# Patient Record
Sex: Female | Born: 1975 | Hispanic: No | Marital: Married | Smoking: Never smoker
Health system: Southern US, Community
[De-identification: ages and names within clinical notes are randomized; demographics above are authoritative.]

## PROBLEM LIST (undated history)

## (undated) ENCOUNTER — Inpatient Hospital Stay (HOSPITAL_COMMUNITY): Payer: Self-pay

## (undated) DIAGNOSIS — D219 Benign neoplasm of connective and other soft tissue, unspecified: Secondary | ICD-10-CM

## (undated) DIAGNOSIS — N96 Recurrent pregnancy loss: Secondary | ICD-10-CM

## (undated) DIAGNOSIS — Z789 Other specified health status: Secondary | ICD-10-CM

## (undated) DIAGNOSIS — I10 Essential (primary) hypertension: Secondary | ICD-10-CM

## (undated) DIAGNOSIS — C801 Malignant (primary) neoplasm, unspecified: Secondary | ICD-10-CM

## (undated) DIAGNOSIS — E119 Type 2 diabetes mellitus without complications: Secondary | ICD-10-CM

## (undated) HISTORY — DX: Essential (primary) hypertension: I10

## (undated) HISTORY — PX: CATARACT EXTRACTION: SUR2

## (undated) HISTORY — DX: Recurrent pregnancy loss: N96

## (undated) HISTORY — DX: Malignant (primary) neoplasm, unspecified: C80.1

## (undated) HISTORY — DX: Type 2 diabetes mellitus without complications: E11.9

## (undated) MED FILL — Pegfilgrastim Soln Prefilled Syringe 6 MG/0.6ML: SUBCUTANEOUS | Qty: 0.6 | Status: AC

---

## 2013-03-15 ENCOUNTER — Emergency Department (HOSPITAL_COMMUNITY)
Admission: EM | Admit: 2013-03-15 | Discharge: 2013-03-15 | Disposition: A | Discharge status: Home / Self Care | Payer: Self-pay | Attending: Emergency Medicine | Admitting: Emergency Medicine

## 2013-03-15 ENCOUNTER — Encounter (HOSPITAL_COMMUNITY): Payer: Self-pay | Admitting: Emergency Medicine

## 2013-03-15 DIAGNOSIS — R1013 Epigastric pain: Secondary | ICD-10-CM | POA: Insufficient documentation

## 2013-03-15 DIAGNOSIS — Z79899 Other long term (current) drug therapy: Secondary | ICD-10-CM | POA: Insufficient documentation

## 2013-03-15 DIAGNOSIS — Z3202 Encounter for pregnancy test, result negative: Secondary | ICD-10-CM | POA: Insufficient documentation

## 2013-03-15 DIAGNOSIS — R109 Unspecified abdominal pain: Secondary | ICD-10-CM

## 2013-03-15 LAB — CBC WITH DIFFERENTIAL/PLATELET
Basophils Absolute: 0 10*3/uL (ref 0.0–0.1)
Basophils Relative: 0 % (ref 0–1)
Eosinophils Absolute: 0.2 10*3/uL (ref 0.0–0.7)
Eosinophils Relative: 2 % (ref 0–5)
HCT: 40.9 % (ref 36.0–46.0)
Hemoglobin: 14.3 g/dL (ref 12.0–15.0)
Lymphs Abs: 2.8 10*3/uL (ref 0.7–4.0)
MCH: 30.3 pg (ref 26.0–34.0)
MCHC: 35 g/dL (ref 30.0–36.0)
Monocytes Absolute: 0.5 10*3/uL (ref 0.1–1.0)
Neutro Abs: 2.9 10*3/uL (ref 1.7–7.7)
RDW: 12.6 % (ref 11.5–15.5)

## 2013-03-15 LAB — POCT I-STAT, CHEM 8
BUN: 9 mg/dL (ref 6–23)
Calcium, Ion: 1.5 mmol/L — ABNORMAL HIGH (ref 1.12–1.23)
Chloride: 106 mEq/L (ref 96–112)
Creatinine, Ser: 0.9 mg/dL (ref 0.50–1.10)
Glucose, Bld: 98 mg/dL (ref 70–99)
Hemoglobin: 15.3 g/dL — ABNORMAL HIGH (ref 12.0–15.0)
TCO2: 25 mmol/L (ref 0–100)

## 2013-03-15 LAB — URINALYSIS, ROUTINE W REFLEX MICROSCOPIC
Bilirubin Urine: NEGATIVE
Hgb urine dipstick: NEGATIVE
Ketones, ur: NEGATIVE mg/dL
Leukocytes, UA: NEGATIVE
Protein, ur: NEGATIVE mg/dL
Urobilinogen, UA: 0.2 mg/dL (ref 0.0–1.0)
pH: 6 (ref 5.0–8.0)

## 2013-03-15 MED ORDER — FAMOTIDINE 20 MG PO TABS
20.0000 mg | ORAL_TABLET | Freq: Once | ORAL | Status: AC
  Administered 2013-03-15: 20 mg via ORAL
  Filled 2013-03-15: qty 1

## 2013-03-15 MED ORDER — SUCRALFATE 1 G PO TABS: 1.0000 g | ORAL_TABLET | Freq: Four times a day (QID) | ORAL | Status: DC

## 2013-03-15 MED ORDER — GI COCKTAIL ~~LOC~~
30.0000 mL | Freq: Once | ORAL | Status: AC
  Administered 2013-03-15: 30 mL via ORAL
  Filled 2013-03-15: qty 30

## 2013-03-15 MED ORDER — FAMOTIDINE 20 MG PO TABS: 20.0000 mg | ORAL_TABLET | Freq: Two times a day (BID) | ORAL | Status: DC

## 2013-03-15 MED ORDER — ACETAMINOPHEN 325 MG PO TABS
650.0000 mg | ORAL_TABLET | Freq: Once | ORAL | Status: AC
  Administered 2013-03-15: 650 mg via ORAL
  Filled 2013-03-15: qty 2

## 2013-03-15 NOTE — ED Provider Notes (Signed)
CSN: [id number]     Arrival date & time 03/15/13  1932 History   First MD Initiated Contact with Patient 03/15/13 2117     Chief Complaint  Patient presents with  . Abdominal Pain   (Consider location/radiation/quality/duration/timing/severity/associated sxs/prior Treatment) HPI Patient presents with concern of abdominal pain.  The pain has been present forweeks. Pain seems to be worse with supine positioning or when the patient is lying on her right side. The pain is epigastric, with occasional radiation up the sternum to the neck.  The pain is sharp, burning. Patient has not tried any medication consistently. Patient is generally well, denies history of medical issues. Patient does not smoke, does not drink, does like spicy food. No change in bowel habits, gas, no vomiting.  No fevers, no chills, no chest pain, dyspnea.   History reviewed. No pertinent past medical history. Past Surgical History  Procedure Laterality Date  . Cataract extraction     History reviewed. No pertinent family history. History  Substance Use Topics  . Smoking status: Never Smoker   . Smokeless tobacco: Not on file  . Alcohol Use: No   OB History   Grav Para Term Preterm Abortions TAB SAB Ect Mult Living                 Review of Systems  Constitutional:       Per HPI, otherwise negative  HENT:       Per HPI, otherwise negative  Respiratory:       Per HPI, otherwise negative  Cardiovascular:       Per HPI, otherwise negative  Gastrointestinal: Negative for vomiting.  Endocrine:       Negative aside from HPI  Genitourinary:       Neg aside from HPI   Musculoskeletal:       Per HPI, otherwise negative  Skin: Negative.   Neurological: Negative for syncope.    Allergies  Review of patient's allergies indicates no known allergies.  Home Medications   Current Outpatient Rx  Name  Route  Sig  Dispense  Refill  . acetaminophen (TYLENOL) 325 MG tablet   Oral   Take 325 mg by mouth  every 6 (six) hours as needed for mild pain or fever.         . famotidine (PEPCID) 20 MG tablet   Oral   Take 1 tablet (20 mg total) by mouth 2 (two) times daily.   30 tablet   0   . sucralfate (CARAFATE) 1 G tablet   Oral   Take 1 tablet (1 g total) by mouth 4 (four) times daily.   28 tablet   0    BP 131/74  Pulse 66  Temp(Src) 98.7 F (37.1 C) (Oral)  Resp 14  Ht 5\' 5"  (1.651 m)  Wt 188 lb 12.8 oz (85.639 kg)  BMI 31.42 kg/m2  SpO2 100%  LMP 02/22/2013 Physical Exam  Nursing note and vitals reviewed. Constitutional: She is oriented to person, place, and time. She appears well-developed and well-nourished. No distress.  HENT:  Head: Normocephalic and atraumatic.  Eyes: Conjunctivae and EOM are normal.  Cardiovascular: Normal rate and regular rhythm.   Pulmonary/Chest: Effort normal and breath sounds normal. No stridor. No respiratory distress.  Abdominal: She exhibits no distension. There is no hepatosplenomegaly. There is tenderness in the epigastric area. There is no rigidity, no rebound and no CVA tenderness.  Musculoskeletal: She exhibits no edema.  Neurological: She is alert and oriented to  person, place, and time. No cranial nerve deficit.  Skin: Skin is warm and dry.  Psychiatric: She has a normal mood and affect.    ED Course  Procedures (including critical care time) Labs Review Labs Reviewed  POCT I-STAT, CHEM 8 - Abnormal; Notable for the following:    Calcium, Ion 1.50 (*)    Hemoglobin 15.3 (*)    All other components within normal limits  CBC WITH DIFFERENTIAL  LIPASE, BLOOD  URINALYSIS, ROUTINE W REFLEX MICROSCOPIC  POCT PREGNANCY, URINE   Imaging Review No results found.  EKG Interpretation   None      11:41 PM On re-exam the patient appears calm.  We discussed her results, a trial of medication, the need for followup if she's not improving, and return precautions the tissue she gets worse MDM   1. Abdominal pain    This patient  presents with ongoing abdominal pain.  On exam she is awake and alert, with mild epigastric tenderness to palpation, but no evidence of peritonitis.  With her description of pain is burning, radiates from the sternum superiorly, and is worse with positioning, there is concern for gastroesophageal etiology.  Patient's labs are reassuring, she improved following indication here.  She started Nortrel medication, discharged in stable condition.    Gerhard Munch, MD 03/15/13 [phone]

## 2013-03-15 NOTE — ED Notes (Signed)
Patient with chronic abdominal pain that has been getting worse today, especially when laying on right side.  Patient denies any nausea, vomiting or diarrhea.  Patient is CAOx3.

## 2014-05-18 ENCOUNTER — Other Ambulatory Visit: Payer: Self-pay | Admitting: Internal Medicine

## 2014-05-18 DIAGNOSIS — N939 Abnormal uterine and vaginal bleeding, unspecified: Secondary | ICD-10-CM

## 2014-05-18 DIAGNOSIS — N920 Excessive and frequent menstruation with regular cycle: Secondary | ICD-10-CM

## 2014-06-01 ENCOUNTER — Ambulatory Visit
Admission: RE | Admit: 2014-06-01 | Discharge: 2014-06-01 | Disposition: A | Discharge status: Home / Self Care | Payer: PRIVATE HEALTH INSURANCE | Source: Ambulatory Visit | Attending: Internal Medicine | Admitting: Internal Medicine

## 2014-06-01 ENCOUNTER — Encounter (HOSPITAL_COMMUNITY): Payer: Self-pay | Admitting: Emergency Medicine

## 2014-06-01 ENCOUNTER — Emergency Department (HOSPITAL_COMMUNITY): Payer: PRIVATE HEALTH INSURANCE

## 2014-06-01 ENCOUNTER — Emergency Department (HOSPITAL_COMMUNITY)
Admission: EM | Admit: 2014-06-01 | Discharge: 2014-06-01 | Disposition: A | Discharge status: Home / Self Care | Payer: PRIVATE HEALTH INSURANCE | Attending: Emergency Medicine | Admitting: Emergency Medicine

## 2014-06-01 DIAGNOSIS — Z79899 Other long term (current) drug therapy: Secondary | ICD-10-CM | POA: Diagnosis not present

## 2014-06-01 DIAGNOSIS — N920 Excessive and frequent menstruation with regular cycle: Secondary | ICD-10-CM

## 2014-06-01 DIAGNOSIS — N939 Abnormal uterine and vaginal bleeding, unspecified: Secondary | ICD-10-CM

## 2014-06-01 DIAGNOSIS — R42 Dizziness and giddiness: Secondary | ICD-10-CM | POA: Diagnosis not present

## 2014-06-01 DIAGNOSIS — Z9849 Cataract extraction status, unspecified eye: Secondary | ICD-10-CM | POA: Insufficient documentation

## 2014-06-01 DIAGNOSIS — R002 Palpitations: Secondary | ICD-10-CM | POA: Diagnosis not present

## 2014-06-01 DIAGNOSIS — Z3202 Encounter for pregnancy test, result negative: Secondary | ICD-10-CM | POA: Insufficient documentation

## 2014-06-01 DIAGNOSIS — R5383 Other fatigue: Secondary | ICD-10-CM | POA: Diagnosis not present

## 2014-06-01 LAB — CBC WITH DIFFERENTIAL/PLATELET
BASOS ABS: 0 10*3/uL (ref 0.0–0.1)
Basophils Relative: 0 % (ref 0–1)
Eosinophils Absolute: 0.1 10*3/uL (ref 0.0–0.7)
Eosinophils Relative: 3 % (ref 0–5)
HCT: 39 % (ref 36.0–46.0)
HEMOGLOBIN: 13 g/dL (ref 12.0–15.0)
LYMPHS ABS: 1.9 10*3/uL (ref 0.7–4.0)
Lymphocytes Relative: 43 % (ref 12–46)
MCH: 28.5 pg (ref 26.0–34.0)
MCHC: 33.3 g/dL (ref 30.0–36.0)
MCV: 85.5 fL (ref 78.0–100.0)
MONOS PCT: 7 % (ref 3–12)
Monocytes Absolute: 0.3 10*3/uL (ref 0.1–1.0)
Neutro Abs: 2.1 10*3/uL (ref 1.7–7.7)
Neutrophils Relative %: 47 % (ref 43–77)
Platelets: 206 10*3/uL (ref 150–400)
RBC: 4.56 MIL/uL (ref 3.87–5.11)
RDW: 12.6 % (ref 11.5–15.5)
WBC: 4.4 10*3/uL (ref 4.0–10.5)

## 2014-06-01 LAB — URINALYSIS, ROUTINE W REFLEX MICROSCOPIC
Bilirubin Urine: NEGATIVE
Glucose, UA: NEGATIVE mg/dL
Hgb urine dipstick: NEGATIVE
Ketones, ur: NEGATIVE mg/dL
Leukocytes, UA: NEGATIVE
Nitrite: NEGATIVE
PH: 7.5 (ref 5.0–8.0)
Protein, ur: NEGATIVE mg/dL
Specific Gravity, Urine: 1.006 (ref 1.005–1.030)
UROBILINOGEN UA: 0.2 mg/dL (ref 0.0–1.0)

## 2014-06-01 LAB — COMPREHENSIVE METABOLIC PANEL
ALBUMIN: 4 g/dL (ref 3.5–5.2)
ALK PHOS: 71 U/L (ref 39–117)
ALT: 18 U/L (ref 0–35)
AST: 18 U/L (ref 0–37)
Anion gap: 6 (ref 5–15)
BUN: 7 mg/dL (ref 6–23)
CHLORIDE: 106 mmol/L (ref 96–112)
CO2: 26 mmol/L (ref 19–32)
CREATININE: 0.75 mg/dL (ref 0.50–1.10)
Calcium: 10.6 mg/dL — ABNORMAL HIGH (ref 8.4–10.5)
GFR calc non Af Amer: >90 mL/min (ref >90)
GLUCOSE: 106 mg/dL — AB (ref 70–99)
POTASSIUM: 4 mmol/L (ref 3.5–5.1)
Sodium: 138 mmol/L (ref 135–145)
TOTAL PROTEIN: 7.5 g/dL (ref 6.0–8.3)
Total Bilirubin: 0.7 mg/dL (ref 0.3–1.2)

## 2014-06-01 LAB — POC URINE PREG, ED: Preg Test, Ur: NEGATIVE

## 2014-06-01 LAB — TROPONIN I

## 2014-06-01 MED ORDER — NAPROXEN 500 MG PO TABS
500.0000 mg | ORAL_TABLET | Freq: Two times a day (BID) | ORAL | Status: DC
Start: 2014-06-01 — End: 2014-09-30

## 2014-06-01 NOTE — Discharge Instructions (Signed)
Take the prescribed medication as directed to help with joint pains. Follow-up with your doctor to have your sestamibi scan done.  Your results from today are attached on back. Return to the ED for new or worsening symptoms.   Emergency Department Resource Guide 1) Find a Doctor and Pay Out of Pocket Although you won't have to find out who is covered by your insurance plan, it is a good idea to ask around and get recommendations. You will then need to call the office and see if the doctor you have chosen will accept you as a new patient and what types of options they offer for patients who are self-pay. Some doctors offer discounts or will set up payment plans for their patients who do not have insurance, but you will need to ask so you aren't surprised when you get to your appointment.  2) Contact Your Local Health Department Not all health departments have doctors that can see patients for sick visits, but many do, so it is worth a call to see if yours does. If you don't know where your local health department is, you can check in your phone book. The CDC also has a tool to help you locate your state's health department, and many state websites also have listings of all of their local health departments.  3) Find a [facility] [place] [facility] If your illness is not likely to be very severe or complicated, you may want to try a walk in clinic. These are popping up all over the country in pharmacies, drugstores, and shopping centers. They're usually staffed by nurse practitioners or physician assistants that have been trained to treat common illnesses and complaints. They're usually fairly quick and inexpensive. However, if you have serious medical issues or chronic medical problems, these are probably not your best option.  No Primary Care Doctor: - Call Health Connect at  [phone] - they can help you locate a primary care doctor that  accepts your insurance, provides certain services, etc. - Physician Referral  Service- [phone]  Chronic Pain Problems: Organization         Address  Phone   Notes  [facility]  [phone] Patients need to be referred by their primary care doctor.   Medication Assistance: Organization         Address  Phone   Notes  [facility] Medication [facility] [address] [place], [place] [address] [phone] --Must be a resident of [facility] -- Must have NO insurance coverage whatsoever (no Medicaid/ Medicare, etc.) -- The pt. MUST have a primary care doctor that directs their care regularly and follows them in the community   [facility]ist  [phone]   [employer]  [phone]    Agencies that provide inexpensive medical care: Organization         Address  Phone   Notes  [place]  [phone]   [facility]    [phone]   [facility] [place], [place] [address] [phone]   [place] [address] [place] [phone]   Planned Parenthood    [phone]   [facility]    [phone]   [place] and [place] [address],  Phone:  [phone], Fax:  [phone] Hours of Operation:  9 am - 6 pm, M-F.  Also accepts Medicaid/Medicare and self-pay.  [facility] for [place] [address], [address], [place] Phone: [phone], Fax: [phone]. Hours of Operation:  8:30 am - 5:30 pm, M-F.  Also accepts Medicaid and self-pay.  [facility] [place] [address] [place] Phone: [phone]   [place], [place], [place] [phone], Ext. [phone] Mondays & Thursdays: 7-9 AM.  First 15 patients are seen on a first come, first serve basis.    [place] Providers:  Organization         Address  Phone   Notes  [facility] [address],  [phone] Also accepts self-pay patients.  [facility] [address], [place]  [phone]   [place], [address], [place] [phone]   [facility] [address] [place] [phone]   Lucianne Lei [address], [place]   [phone] Only accepts [place] Access [place] patients after they have their name applied to their card.   Self-Pay (no insurance) in [facility]:  Organization         Address  Phone   Notes  Sickle Cell Patients, [facility] [place] [phone]   [facility] Urgent Care [place] [phone]   [facility] [place]  [place], [place], [place] [phone]   [facility]/Dr. Osei-Bonsu  [address], [place] or [address], [place] [phone] Phone number for both [place] and [place] locations is the same.  Urgent Medical and [facility] [address] [place] [phone]   [facility] [place] [address], [place] or [address] [phone]   [facility] [name], [place] [phone], phone; [phone], fax Sees patients 1st and 3rd Saturday of every month.  Must not qualify for public or private insurance (i.e. Medicaid, Medicare, [place] Health Choice, Veterans' Benefits)  Household income should be no more than 200% of the poverty level The clinic cannot treat you if you are pregnant or think you are pregnant  Sexually transmitted diseases are not treated at the clinic.    Dental Care: Organization         Address  Phone  Notes  [facility] [place] [phone] Accepts children up to age 97 who are enrolled in  [place] or [place]; pregnant women with a Medicaid card; and children who have applied for Medicaid or [place] Health Choice, but were declined, whose parents can pay a reduced fee at time of service.  [facility]  [address], [place] [phone] Accepts children up to age 19 who are enrolled in [place] or [place]; pregnant women with a Medicaid card; and children who have applied for Medicaid or [place] Health Choice, but were declined, whose parents can pay a reduced fee at time of service.  [facility] Adult [facility] Access PROGRAM  [place] [phone] Patients are seen by appointment only. Walk-ins are not accepted. [place] will see patients 69 years of age and older. Monday - Tuesday (8am-5pm) Most Wednesdays (8:30-5pm) $30 per  visit, cash only  [facility] Adult [employer] PROGRAM  [address], [facility] [address] [facility] [phone] Patients are seen by appointment only. Walk-ins are not accepted. [place] will see patients 48 years of age and older. One Wednesday Evening (Monthly: Volunteer Based).  $30 per visit, cash only  [place]  [phone] for adults; Children under age 62, call Graduate Pediatric Dentistry at [phone]. Children aged 41-14, please call [phone] to request a pediatric application.  Dental services are provided in all areas of dental care including fillings, crowns and bridges, complete and partial dentures, implants, gum treatment, root canals, and extractions. Preventive care is also provided. Treatment is provided to both adults and children. Patients are selected via a lottery and there is often a waiting list.   [facility] [address] [place]  [phone] [email or web address]   [facility] [address] [place], [place] [phone], Ext. [phone] Second and Fourth Thursday of each month, opens at 6:30  AM; Clinic ends at 9 AM.  Patients are seen on a first-come first-served basis, and a limited number are seen during each clinic.   [facility]  [address] [place] Danker [place], [place] [phone]   Eligibility Requirements You must have lived in [place], [place], or [place] counties for at least the last three months.   You cannot be eligible for state or federal sponsored [employer], including [employer], [place], or [employer].   You generally cannot be eligible for healthcare insurance through your employer.    How to apply: Eligibility screenings are held every Tuesday and Wednesday afternoon from 1:00 pm until 4:00 pm. You do not need an appointment for the interview!  [facility] [address], [place], [place]   [place]  [place] Department  [place]  [phone]    Behavioral Health Resources in the Community: Intensive Outpatient Programs Organization         Address  Phone  Notes  [place]. [address] [place], [place] [phone]   [facility] Outpatient [address] [place], [place]   ADS: Alcohol & Drug S[facility]cs [address] [place], [place]   [place] [address]  [place], [place] or [phone]   Substance Abuse Resources Organization         Address  Phone  Notes  Alcohol and Drug Services  [phone]   [place]  [phone]   [facility] [place]   Chinita Pester  [phone]   Residential & Outpatient Substance Abuse Program  [phone]   Psychological Services Organization         Address  Phone  Notes  [employer] [place]  [place]  [phone]   [place] [address] [place] or  [phone]    Mobile Crisis Teams Organization         Address  Phone  Notes  [facility], Mobile Crisis Care Unit  [phone]   [facility]  [address] [place], [place]   [place] [address], [place] [phone]    Self-Help/Support Groups Organization         Address  Phone  Notes  [facility]ental [facility]oc[facility] of [place] - variety of support groups  [place] Call for more information  Narcotics Anonymous (NA), [facility] [address], [employer] [place]  2 meetings at this location   Special educational needs teacher         Address  Phone  Notes  ASAP Residential Treatment [place],    [place]  [phone]   [facility]  [address] [place] [phone], [place], [place]   [place], [name] [phone] Admissions: 8am-3pm M-F  Incentives Substance [place] [address]    [place], [place] [phone]   [facility] [address] [place], [place], [place]   [facility] [address],  [place], [place]   [facility] - Intensive Outpatient [address] Kristeen [address], [place], [place]   [facility] [address]  [place], [place] [phone] or [phone]   [facility] Services (RTS) [address] [place], [place] Accepts Medicaid  [facility] [place] [address],  [place] [phone] Substance Abuse/Addiction Treatment   [facility] Organization         Address  Phone  Notes  [facility]  [phone]   Domenic Schwab, PhD [address] Arlis Porta [place], [place]   [phone] or [phone]   [place] and [place], [place] [phone]   [facility] [address],  [place], [place] [phone] Insurance/Medicaid/sponsorship through [facility] and Families [address], [address] [place]                                    [place], [place] [phone] [place] [address] [place], [place] [phone]    Dr. Adele Schilder  [phone]   [facility] Dept. 1) [address], [place] 2) [place] 3)  [place] [address], [place] [phone]  [phone]   [place] [phone] or [phone] (After Hours)

## 2014-06-01 NOTE — ED Notes (Signed)
Pt c/o dizziness x 2 days; pt denies pain and sts worse with position change

## 2014-06-01 NOTE — ED Provider Notes (Signed)
CSN: [id number]     Arrival date & time 06/01/14  1132 History   First MD Initiated Contact with Patient 06/01/14 1459     Chief Complaint  Patient presents with  . Dizziness     (Consider location/radiation/quality/duration/timing/severity/associated sxs/prior Treatment) The history is provided by the patient and medical records.    [name] with past medical history significant for cataracts, presenting to the ED for intermittent dizziness. Patient states for the past 2 months she has had intermittent dizziness, mostly upon standing and with movement. She also feels very fatigued on a daily basis with low energy. Her appetite has been normal, eating well. No significant weight loss.  Patient is also currently being evaluated for hypercalcemia at another facility. She is scheduled for a sestamibi scan in 2 weeks for further evaluation of this. Her thyroid function tests have all been normal thus far. She also admits to some palpitations over the past 2 days. She denies any chest pain or shortness of breath. Patient has no known cardiac history. She is not a smoker.  She denies recent illness, fever, chills, sweats, cough, congestion, or other URI symptoms.  VSS on arrival.  History reviewed. No pertinent past medical history. Past Surgical History  Procedure Laterality Date  . Cataract extraction     History reviewed. No pertinent family history. History  Substance Use Topics  . Smoking status: Never Smoker   . Smokeless tobacco: Not on file  . Alcohol Use: No   OB History    No data available     Review of Systems  Constitutional: Positive for fatigue.  Cardiovascular: Positive for palpitations.  Neurological: Positive for dizziness.  All other systems reviewed and are negative.     Allergies  Review of patient's allergies indicates no known allergies.  Home Medications   Prior to Admission medications   Medication Sig Start Date End Date Taking?  Authorizing Provider  acetaminophen (TYLENOL) 325 MG tablet Take 325 mg by mouth every 6 (six) hours as needed for mild pain or fever.    Historical Provider, MD  famotidine (PEPCID) 20 MG tablet Take 1 tablet (20 mg total) by mouth 2 (two) times daily. 03/15/13   Carmin Muskrat, MD  sucralfate (CARAFATE) 1 G tablet Take 1 tablet (1 g total) by mouth 4 (four) times daily. 03/15/13 03/22/13  Carmin Muskrat, MD   BP 118/69 mmHg  Pulse 74  Temp(Src) 99.1 F (37.3 C) (Oral)  Resp 16  Ht 5\' 8"  (1.727 m)  Wt 171 lb (77.565 kg)  BMI 26.01 kg/m2  SpO2 100%   Physical Exam  Constitutional: She is oriented to person, place, and time. She appears well-developed and well-nourished. No distress.  HENT:  Head: Normocephalic and atraumatic.  Mouth/Throat: Oropharynx is clear and moist.  Eyes: Conjunctivae and EOM are normal. Pupils are equal, round, and reactive to light.  Neck: Trachea normal, normal range of motion and phonation normal. No thyroid mass and no thyromegaly present.  No appreciable neck mass; no thryomegaly  Cardiovascular: Normal rate, regular rhythm and normal heart sounds.   Pulmonary/Chest: Effort normal and breath sounds normal. No respiratory distress. She has no wheezes.  Abdominal: Soft. Bowel sounds are normal. There is no tenderness. There is no guarding.  Musculoskeletal: Normal range of motion. She exhibits no edema.  Neurological: She is alert and oriented to person, place, and time.  AAOx3, answering questions appropriately; equal strength UE and LE bilaterally; CN grossly intact; moves all extremities  appropriately without ataxia; no focal neuro deficits or facial asymmetry appreciated; steady gait  Skin: Skin is warm and dry. She is not diaphoretic.  Psychiatric: She has a normal mood and affect.  Nursing note and vitals reviewed.   ED Course  Procedures (including critical care time) Labs Review Labs Reviewed  COMPREHENSIVE METABOLIC PANEL - Abnormal; Notable  for the following:    Glucose, Bld 106 (*)    Calcium 10.6 (*)    All other components within normal limits  CBC WITH DIFFERENTIAL/PLATELET  URINALYSIS, ROUTINE W REFLEX MICROSCOPIC  POC URINE PREG, ED    Imaging Review US Transvaginal Non-ob  06/01/2014   CLINICAL DATA:  Excessive vaginal bleeding. Generalized pelvic pain for several months. Patient reports a history of an endometrial polyp seen many years ago.  EXAM: TRANSABDOMINAL AND TRANSVAGINAL ULTRASOUND OF PELVIS  DOPPLER ULTRASOUND OF OVARIES  TECHNIQUE: Both transabdominal and transvaginal ultrasound examinations of the pelvis were performed. Transabdominal technique was performed for global imaging of the pelvis including uterus, ovaries, adnexal regions, and pelvic cul-de-sac.  It was necessary to proceed with endovaginal exam following the transabdominal exam to visualize the uterus, endometrium and ovaries to better advantage. Color and duplex Doppler ultrasound was utilized to evaluate blood flow to the ovaries.  COMPARISON:  None.  FINDINGS: Uterus  Measurements: 11.6 cm x 5.9 cm x 7.0 cm. There is a heterogeneous mildly hypoechoic mass arises from the upper left uterine segment and fundus, with a submucosal component displacing the endometrium. Mass measures 5.5 cm x 4.3 cm x 4.7 cm. No other uterine masses.  Endometrium  Thickness: 4.4 mm.  No focal abnormality visualized.  Right ovary  Measurements: 14 mm x 30 mm x 14 mm. Normal appearance/no adnexal mass.  Left ovary  Measurements: 4.9 cm x 1.9 cm x 2.9 cm. Two irregular cysts are noted along 1 pole measuring 15 mm and 12 mm in greatest dimensions. No adnexal masses.  Pulsed Doppler evaluation of both ovaries demonstrates normal low-resistance arterial and venous waveforms.  Other findings  No free fluid.  IMPRESSION: 1. No acute findings. 2. 5.5 cm left upper uterine segment and uterine fundal fibroid with a submucosal component displacing the overlying endometrium. This may be the  cause of this patient's abnormal uterine bleeding. 3. Two small mildly complicated cysts along the margin of the left ovary. 4. No evidence of an endometrial polyp.  No endometrial thickening.   Electronically Signed   By: Lajean Manes M.D.   On: 06/01/2014 13:23   US Pelvis Complete  06/01/2014   CLINICAL DATA:  Excessive vaginal bleeding. Generalized pelvic pain for several months. Patient reports a history of an endometrial polyp seen many years ago.  EXAM: TRANSABDOMINAL AND TRANSVAGINAL ULTRASOUND OF PELVIS  DOPPLER ULTRASOUND OF OVARIES  TECHNIQUE: Both transabdominal and transvaginal ultrasound examinations of the pelvis were performed. Transabdominal technique was performed for global imaging of the pelvis including uterus, ovaries, adnexal regions, and pelvic cul-de-sac.  It was necessary to proceed with endovaginal exam following the transabdominal exam to visualize the uterus, endometrium and ovaries to better advantage. Color and duplex Doppler ultrasound was utilized to evaluate blood flow to the ovaries.  COMPARISON:  None.  FINDINGS: Uterus  Measurements: 11.6 cm x 5.9 cm x 7.0 cm. There is a heterogeneous mildly hypoechoic mass arises from the upper left uterine segment and fundus, with a submucosal component displacing the endometrium. Mass measures 5.5 cm x 4.3 cm x 4.7 cm. No other uterine masses.  Endometrium  Thickness: 4.4 mm.  No focal abnormality visualized.  Right ovary  Measurements: 14 mm x 30 mm x 14 mm. Normal appearance/no adnexal mass.  Left ovary  Measurements: 4.9 cm x 1.9 cm x 2.9 cm. Two irregular cysts are noted along 1 pole measuring 15 mm and 12 mm in greatest dimensions. No adnexal masses.  Pulsed Doppler evaluation of both ovaries demonstrates normal low-resistance arterial and venous waveforms.  Other findings  No free fluid.  IMPRESSION: 1. No acute findings. 2. 5.5 cm left upper uterine segment and uterine fundal fibroid with a submucosal component displacing the  overlying endometrium. This may be the cause of this patient's abnormal uterine bleeding. 3. Two small mildly complicated cysts along the margin of the left ovary. 4. No evidence of an endometrial polyp.  No endometrial thickening.   Electronically Signed   By: Lajean Manes M.D.   On: 06/01/2014 13:23     EKG Interpretation   Date/Time:  Monday June 01 2014 15:22:35 EST Ventricular Rate:  67 PR Interval:  176 QRS Duration: 82 QT Interval:  388 QTC Calculation: 409 R Axis:   30 Text Interpretation:  Normal sinus rhythm Normal ECG No old tracing to  compare Confirmed by [facility]  MD, DAVID ([id number]) on 06/01/2014 3:30:23 PM      MDM   Final diagnoses:  Dizziness  Palpitations  Hypercalcemia   39 year old female here with multiple complaints. She reports dizziness and generalized fatigue intermittently for the past 2 months.  Patient also admits to palpitations over the past 2 days. Denies any associated chest pain or shortness of breath.  Patient is currently undergoing evaluation for hypercalcemia. She has copies of her labs here with her which i have reviewed.  Her most recent values were 11.2 on 05/20/14, up from 10.7 on 05/08/14.  TSH values WNL at 1.640.  Patient has been scheduled for sestamibi scan with concern for parathyroid tumor.  On exam, patient in no acute distress. Her neurologic exam is nonfocal. EKG sinus rhythm without acute ischemic changes. Labwork is reassuring, troponin negative. Minimal hypercalcemia again noted. Chest x-ray is clear. Due to family concern for intracranial pathology, CT head was obtained which is also negative.  Given negative workup today, low suspicion for neurologic or cardiac etiology of her dizziness. Low suspicion for ACS, PE, dissection, or other acute cardiac event. 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 She is to follow-up with clinic regarding her sestamibi scan.  She was also given resource guide if additional services needed.  Discussed plan with patient, he/she acknowledged understanding and agreed with plan of care.  Return precautions given for new or worsening symptoms.  Larene Pickett, PA-C 67/12/45 [id number]  Delora Fuel, MD 83/38/25 0539

## 2014-06-01 NOTE — ED Notes (Signed)
PA student in with pt.

## 2014-09-30 ENCOUNTER — Encounter (HOSPITAL_COMMUNITY): Payer: Self-pay

## 2014-09-30 ENCOUNTER — Inpatient Hospital Stay (HOSPITAL_COMMUNITY)
Admission: AD | Admit: 2014-09-30 | Discharge: 2014-09-30 | Disposition: A | Discharge status: Home / Self Care | Payer: PRIVATE HEALTH INSURANCE | Source: Ambulatory Visit | Attending: Obstetrics & Gynecology | Admitting: Obstetrics & Gynecology

## 2014-09-30 ENCOUNTER — Inpatient Hospital Stay (HOSPITAL_COMMUNITY): Payer: PRIVATE HEALTH INSURANCE

## 2014-09-30 DIAGNOSIS — R109 Unspecified abdominal pain: Secondary | ICD-10-CM | POA: Diagnosis present

## 2014-09-30 DIAGNOSIS — R1030 Lower abdominal pain, unspecified: Secondary | ICD-10-CM

## 2014-09-30 DIAGNOSIS — O3411 Maternal care for benign tumor of corpus uteri, first trimester: Secondary | ICD-10-CM | POA: Diagnosis not present

## 2014-09-30 DIAGNOSIS — O09519 Supervision of elderly primigravida, unspecified trimester: Secondary | ICD-10-CM

## 2014-09-30 DIAGNOSIS — D259 Leiomyoma of uterus, unspecified: Secondary | ICD-10-CM | POA: Diagnosis not present

## 2014-09-30 DIAGNOSIS — O9989 Other specified diseases and conditions complicating pregnancy, childbirth and the puerperium: Secondary | ICD-10-CM | POA: Diagnosis not present

## 2014-09-30 DIAGNOSIS — O09511 Supervision of elderly primigravida, first trimester: Secondary | ICD-10-CM

## 2014-09-30 DIAGNOSIS — O3680X0 Pregnancy with inconclusive fetal viability, not applicable or unspecified: Secondary | ICD-10-CM

## 2014-09-30 DIAGNOSIS — Z3A01 Less than 8 weeks gestation of pregnancy: Secondary | ICD-10-CM | POA: Diagnosis not present

## 2014-09-30 DIAGNOSIS — O26899 Other specified pregnancy related conditions, unspecified trimester: Secondary | ICD-10-CM

## 2014-09-30 HISTORY — DX: Benign neoplasm of connective and other soft tissue, unspecified: D21.9

## 2014-09-30 HISTORY — DX: Other specified health status: Z78.9

## 2014-09-30 LAB — CBC
HCT: 36.4 % (ref 36.0–46.0)
Hemoglobin: 12.3 g/dL (ref 12.0–15.0)
MCH: 29.4 pg (ref 26.0–34.0)
MCHC: 33.8 g/dL (ref 30.0–36.0)
MCV: 86.9 fL (ref 78.0–100.0)
Platelets: 246 10*3/uL (ref 150–400)
RBC: 4.19 MIL/uL (ref 3.87–5.11)
RDW: 13 % (ref 11.5–15.5)
WBC: 4.8 10*3/uL (ref 4.0–10.5)

## 2014-09-30 LAB — URINALYSIS, ROUTINE W REFLEX MICROSCOPIC
BILIRUBIN URINE: NEGATIVE
Glucose, UA: NEGATIVE mg/dL
KETONES UR: NEGATIVE mg/dL
Leukocytes, UA: NEGATIVE
NITRITE: NEGATIVE
PH: 6 (ref 5.0–8.0)
Protein, ur: NEGATIVE mg/dL
SPECIFIC GRAVITY, URINE: 1.025 (ref 1.005–1.030)
Urobilinogen, UA: 0.2 mg/dL (ref 0.0–1.0)

## 2014-09-30 LAB — URINE MICROSCOPIC-ADD ON

## 2014-09-30 LAB — ABO/RH: ABO/RH(D): B POS

## 2014-09-30 LAB — WET PREP, GENITAL
Clue Cells Wet Prep HPF POC: NONE SEEN
Trich, Wet Prep: NONE SEEN
YEAST WET PREP: NONE SEEN

## 2014-09-30 LAB — HCG, QUANTITATIVE, PREGNANCY: hCG, Beta Chain, Quant, S: 837 m[IU]/mL — ABNORMAL HIGH (ref <5)

## 2014-09-30 LAB — POCT PREGNANCY, URINE: Preg Test, Ur: POSITIVE — AB

## 2014-09-30 MED ORDER — PRENATAL PLUS 27-1 MG PO TABS: 1.0000 | ORAL_TABLET | Freq: Every day | ORAL | Status: DC

## 2014-09-30 NOTE — Discharge Instructions (Signed)

## 2014-09-30 NOTE — MAU Note (Signed)
Lower abd & back pain for the last 5 days, is late for period.

## 2014-09-30 NOTE — MAU Provider Note (Signed)
History     CSN: [id number]  Arrival date and time: 09/30/14 1214   First Provider Initiated Contact with Patient 09/30/14 1321    Friend here for interpretaion  Chief Complaint  Patient presents with  . Abdominal Pain  . Back Pain   HPI Comments: Crystal [place] is a 39 y.o. [place] G2P0010 at [redacted]w[redacted]d with history significant for fibroids presenting with intermittent lower abdominal cramping for the past 5 days. She denies vaginal bleeding since normal LMP 08/21/14. No hx menstrual irregularities or intermenstrual bleeding.    Abdominal Pain This is a new problem. The current episode started in the past 7 days. The onset quality is gradual. The problem occurs intermittently. Duration: several minutesR>L suprapubic. The problem has been unchanged. Pain location: right greater than left suprapubic region. The pain is at a severity of 3/10. The pain is mild. The quality of the pain is cramping. The abdominal pain radiates to the back (radiates to mid low back). Associated symptoms include frequency. Pertinent negatives include no constipation, dysuria or hematuria. Associated symptoms comments: No vaginal bleeding. Nothing aggravates the pain. The pain is relieved by nothing. She has tried nothing for the symptoms. Prior workup: Had [place] 06/01/14 showing 5cm fibroid.  Back Pain Associated symptoms include abdominal pain. Pertinent negatives include no dysuria.    OB History  Gravida Para Term Preterm AB SAB TAB Ectopic Multiple Living  1             # Outcome Date GA Lbr Len/2nd Weight Sex Delivery Anes PTL Lv  1 Current              2014: early SAB in [place], was on progesterone   Past Medical History  Diagnosis Date  . Medical history non-contributory     Past Surgical History  Procedure Laterality Date  . Cataract extraction      No family history on file.  History  Substance Use Topics  . Smoking status: Never Smoker   . Smokeless tobacco: Not on file  . Alcohol Use: No     Allergies: No Known Allergies  Prescriptions prior to admission  Medication Sig Dispense Refill Last Dose  . naproxen (NAPROSYN) 500 MG tablet Take 1 tablet (500 mg total) by mouth 2 (two) times daily with a meal. (Patient not taking: Reported on 09/30/2014) 30 tablet 0   . [DISCONTINUED] famotidine (PEPCID) 20 MG tablet Take 1 tablet (20 mg total) by mouth 2 (two) times daily. 30 tablet 0   . [DISCONTINUED] sucralfate (CARAFATE) 1 G tablet Take 1 tablet (1 g total) by mouth 4 (four) times daily. 28 tablet 0     Review of Systems  Gastrointestinal: Positive for abdominal pain. Negative for constipation.  Genitourinary: Positive for frequency. Negative for dysuria and hematuria.  Musculoskeletal: Positive for back pain.   Physical Exam   Blood pressure 127/72, pulse 100, temperature 98.8 F (37.1 C), temperature source Oral, resp. rate 18, last menstrual period 08/21/2014.  Physical Exam  Nursing note and vitals reviewed. Constitutional: She is oriented to person, place, and time. She appears well-developed and well-nourished.  HENT:  Head: Normocephalic.  Eyes: Pupils are equal, round, and reactive to light.  Neck: Normal range of motion. No thyromegaly present.  Cardiovascular: Normal rate.   Respiratory: Effort normal.  GI: Soft. There is no tenderness.  Genitourinary: Vagina normal.  Female circumcision; cx without lesions, scant pink brown d/c on cx swab; L/C; Uterus 8 wk size  Musculoskeletal: Normal range of motion.  She exhibits no edema.  Neurological: She is alert and oriented to person, place, and time.  Skin: Skin is warm and dry.    MAU Course  Procedures Results for orders placed or performed during the hospital encounter of 09/30/14 (from the past 24 hour(s))  Urinalysis, Routine w reflex microscopic (not at [facility])     Status: Abnormal   Collection Time: 09/30/14 12:35 PM  Result Value Ref Range   Color, Urine YELLOW YELLOW   APPearance CLEAR CLEAR    Specific Gravity, Urine 1.025 1.005 - 1.030   pH 6.0 5.0 - 8.0   Glucose, UA NEGATIVE NEGATIVE mg/dL   Hgb urine dipstick TRACE (A) NEGATIVE   Bilirubin Urine NEGATIVE NEGATIVE   Ketones, ur NEGATIVE NEGATIVE mg/dL   Protein, ur NEGATIVE NEGATIVE mg/dL   Urobilinogen, UA 0.2 0.0 - 1.0 mg/dL   Nitrite NEGATIVE NEGATIVE   Leukocytes, UA NEGATIVE NEGATIVE  Urine microscopic-add on     Status: Abnormal   Collection Time: 09/30/14 12:35 PM  Result Value Ref Range   Squamous Epithelial / LPF MANY (A) RARE   WBC, UA 0-2 <3 WBC/hpf   RBC / HPF 0-2 <3 RBC/hpf   Bacteria, UA MANY (A) RARE  Pregnancy, urine POC     Status: Abnormal   Collection Time: 09/30/14 12:45 PM  Result Value Ref Range   Preg Test, Ur POSITIVE (A) NEGATIVE  CBC     Status: None   Collection Time: 09/30/14  1:42 PM  Result Value Ref Range   WBC 4.8 4.0 - 10.5 K/uL   RBC 4.19 3.87 - 5.11 MIL/uL   Hemoglobin 12.3 12.0 - 15.0 g/dL   HCT 36.4 36.0 - 46.0 %   MCV 86.9 78.0 - 100.0 fL   MCH 29.4 26.0 - 34.0 pg   MCHC 33.8 30.0 - 36.0 g/dL   RDW 13.0 11.5 - 15.5 %   Platelets 246 150 - 400 K/uL  ABO/Rh     Status: None (Preliminary result)   Collection Time: 09/30/14  1:42 PM  Result Value Ref Range   ABO/RH(D) B POS   hCG, quantitative, pregnancy     Status: Abnormal   Collection Time: 09/30/14  1:42 PM  Result Value Ref Range   hCG, Beta Chain, Quant, S 837 (H) <5 mIU/mL  Wet prep, genital     Status: Abnormal   Collection Time: 09/30/14  1:45 PM  Result Value Ref Range   Yeast Wet Prep HPF POC NONE SEEN NONE SEEN   Trich, Wet Prep NONE SEEN NONE SEEN   Clue Cells Wet Prep HPF POC NONE SEEN NONE SEEN   WBC, Wet Prep HPF POC MODERATE (A) NONE SEEN   GC/CT, HIV sent   CLINICAL DATA: Five day history of pelvic pain with reported positive pregnancy test. Mild vaginal bleeding.  EXAM: OBSTETRIC <14 WK [place] AND TRANSVAGINAL OB US  TECHNIQUE: Both transabdominal and transvaginal ultrasound examinations  were performed for complete evaluation of the gestation as well as the maternal uterus, adnexal regions, and pelvic cul-de-sac. Transvaginal technique was performed to assess early pregnancy.  COMPARISON: June 01, 2014  FINDINGS: Intrauterine gestational sac: Not visualized  Yolk sac: Not visualized  Embryo: Not visualized  Maternal uterus/adnexae: Uterus measures 9.9 x 6.3 x 10.8 cm. There is a dominant leiomyoma in the leftward aspect of the uterus measuring 6.5 x 5.4 x 6.5 cm. There is a small leiomyoma measuring 7 x 7 x 6 mm in the rightward uterine fundus region.  A second right-sided leiomyoma is noted measuring 1.2 x 1.2 x 1.2 cm. In the lower uterine segment toward the left, there is a 1.3 x 0.8 x 0.8 cm leiomyoma in the uterus. Elsewhere, the uterus has an inhomogeneous echotexture. The endometrium measures 10 mm in thickness with a smooth contour. Right ovary measures 2.5 x 2.1 x 2.4 cm. Left ovary measures 3.1 x 2.5 x 2.4 cm. There is no extrauterine pelvic or adnexal mass. No appreciable free pelvic fluid.  IMPRESSION: Leiomyomatous uterus with a dominant uterine leiomyoma measuring 6.5 x 5.4 x 6.5 cm. No extrauterine pelvic or adnexal mass seen. No free pelvic fluid. No intrauterine gestation is seen by either transabdominal or transvaginal technique. This finding may be indicative of gestation too early to be seen by either transabdominal or transvaginal technique. Differential considerations, however, must include recent spontaneous abortion or possibly ectopic gestation. This finding will warrant continued clinical and laboratory surveillance. Timing of repeat imaging will in large part depend on beta HCG values in the near future.   Electronically Signed By: Lowella Grip III M.D. On: 09/30/2014 15:12          Assessment and Plan   1. AMA (advanced maternal age) primigravida 36+, first trimester   2. Pregnancy with abdominal pain of  lower quadrant, antepartum   3. Pregnancy of unknown anatomic location   4. Uterine leiomyoma, unspecified location    Discharge with ectopic precautions   Medication List    STOP taking these medications        naproxen 500 MG tablet  Commonly known as:  NAPROSYN      TAKE these medications        prenatal vitamin w/FE, FA 27-1 MG Tabs tablet  Take 1 tablet by mouth daily.       Follow-up Information    Follow up with [facility] In 2 days.   Why:  Repeat blood test   Contact information:   [address] [id number] [place] [phone]      Makaelyn Aponte 09/30/2014, 1:30 PM

## 2014-10-01 LAB — GC/CHLAMYDIA PROBE AMP (~~LOC~~) NOT AT ARMC
Chlamydia: NEGATIVE
Neisseria Gonorrhea: NEGATIVE

## 2014-10-01 LAB — HIV ANTIBODY (ROUTINE TESTING W REFLEX): HIV Screen 4th Generation wRfx: NONREACTIVE

## 2014-10-02 ENCOUNTER — Inpatient Hospital Stay (HOSPITAL_COMMUNITY)
Admission: AD | Admit: 2014-10-02 | Discharge: 2014-10-02 | Disposition: A | Discharge status: Home / Self Care | Payer: PRIVATE HEALTH INSURANCE | Source: Ambulatory Visit | Attending: Obstetrics and Gynecology | Admitting: Obstetrics and Gynecology

## 2014-10-02 DIAGNOSIS — O9989 Other specified diseases and conditions complicating pregnancy, childbirth and the puerperium: Secondary | ICD-10-CM | POA: Insufficient documentation

## 2014-10-02 DIAGNOSIS — O09511 Supervision of elderly primigravida, first trimester: Secondary | ICD-10-CM

## 2014-10-02 DIAGNOSIS — R109 Unspecified abdominal pain: Secondary | ICD-10-CM | POA: Diagnosis present

## 2014-10-02 DIAGNOSIS — Z3A00 Weeks of gestation of pregnancy not specified: Secondary | ICD-10-CM | POA: Diagnosis not present

## 2014-10-02 LAB — HCG, QUANTITATIVE, PREGNANCY: hCG, Beta Chain, Quant, S: 2283 m[IU]/mL — ABNORMAL HIGH (ref <5)

## 2014-10-02 NOTE — Progress Notes (Addendum)
Lab in to draw blood. Then to lobby to await results.

## 2014-10-02 NOTE — MAU Provider Note (Signed)
Subjective:   Pt is a 39 y.o. G2P0010 here for follow-up BHCG.  Upon review of the records patient was first seen on 09/30/14 for lower abdominal cramping and no vaginal bleeding.   BHCG on that day was 837.  Ultrasound showed Leiomyomatous uterus with a dominant uterine leiomyoma measuring 6.5x 5.4 x 6.5 cm. No extrauterine pelvic or adnexal mass seen. No free pelvic fluid. No intrauterine gestation is seen by either transabdominal or transvaginal technique. This finding may be indicative of gestation too early to be seen by either transabdominal or transvaginal technique. Differential considerations, however, must include recent spontaneous abortion or possibly ectopic gestation. This finding will warrant continued clinical and laboratory surveillance. Timing of repeat imaging will in large part depend on beta HCG values in the near future.Lenard Forth prep was collected.  Results were negative.   Pt discharged home with ectopic precautions and to follow-up in 48 hours for BHCG.   Denies pelvic pain or vaginal bleeding today.    Objective: Physical Exam  Filed Vitals:   10/02/14 1216  BP: 131/74  Pulse: 82  Temp: 98 F (36.7 C)  Resp: 18   Constitutional: She is oriented to person, place, and time. She appears well-developed and well-nourished. No distress.  Neck: Normal range of motion.  Pulmonary/Chest: Effort normal. No respiratory distress.  Musculoskeletal: Normal range of motion.  Neurological: She is alert and oriented to person, place, and time.  Skin: Skin is warm and dry.   Results for orders placed or performed during the hospital encounter of 10/02/14 (from the past 24 hour(s))  hCG, quantitative, pregnancy     Status: Abnormal   Collection Time: 10/02/14 12:25 PM  Result Value Ref Range   hCG, Beta Chain, Quant, S 2283 (H) <5 mIU/mL    Assessment: 39 y.o. G2P0010 with pregnancy of unknown location   Plan: Repeat ultrasound in one week Ectopic precautions  reviewed Gwen Pounds, CNM

## 2014-10-02 NOTE — Discharge Instructions (Signed)
First Trimester of Pregnancy The first trimester of pregnancy is from week 1 until the end of week 12 (months 1 through 3). A week after a sperm fertilizes an egg, the egg will implant on the wall of the uterus. This embryo will begin to develop into a baby. Genes from you and your partner are forming the baby. The female genes determine whether the baby is a boy or a girl. At 6-8 weeks, the eyes and face are formed, and the heartbeat can be seen on ultrasound. At the end of 12 weeks, all the baby's organs are formed.  Now that you are pregnant, you will want to do everything you can to have a healthy baby. Two of the most important things are to get good prenatal care and to follow your health care provider's instructions. Prenatal care is all the medical care you receive before the baby's birth. This care will help prevent, find, and treat any problems during the pregnancy and childbirth. BODY CHANGES Your body goes through many changes during pregnancy. The changes vary from woman to woman.   You may gain or lose a couple of pounds at first.  You may feel sick to your stomach (nauseous) and throw up (vomit). If the vomiting is uncontrollable, call your health care provider.  You may tire easily.  You may develop headaches that can be relieved by medicines approved by your health care provider.  You may urinate more often. Painful urination may mean you have a bladder infection.  You may develop heartburn as a result of your pregnancy.  You may develop constipation because certain hormones are causing the muscles that push waste through your intestines to slow down.  You may develop hemorrhoids or swollen, bulging veins (varicose veins).  Your breasts may begin to grow larger and become tender. Your nipples may stick out more, and the tissue that surrounds them (areola) may become darker.  Your gums may bleed and may be sensitive to brushing and flossing.  Dark spots or blotches (chloasma,  mask of pregnancy) may develop on your face. This will likely fade after the baby is born.  Your menstrual periods will stop.  You may have a loss of appetite.  You may develop cravings for certain kinds of food.  You may have changes in your emotions from day to day, such as being excited to be pregnant or being concerned that something may go wrong with the pregnancy and baby.  You may have more vivid and strange dreams.  You may have changes in your hair. These can include thickening of your hair, rapid growth, and changes in texture. Some women also have hair loss during or after pregnancy, or hair that feels dry or thin. Your hair will most likely return to normal after your baby is born. WHAT TO EXPECT AT YOUR PRENATAL VISITS During a routine prenatal visit:  You will be weighed to make sure you and the baby are growing normally.  Your blood pressure will be taken.  Your abdomen will be measured to track your baby's growth.  The fetal heartbeat will be listened to starting around week 10 or 12 of your pregnancy.  Test results from any previous visits will be discussed. Your health care provider may ask you:  How you are feeling.  If you are feeling the baby move.  If you have had any abnormal symptoms, such as leaking fluid, bleeding, severe headaches, or abdominal cramping.  If you have any questions. Other tests   that may be performed during your first trimester include:  Blood tests to find your blood type and to check for the presence of any previous infections. They will also be used to check for low iron levels (anemia) and Rh antibodies. Later in the pregnancy, blood tests for diabetes will be done along with other tests if problems develop.  Urine tests to check for infections, diabetes, or protein in the urine.  An ultrasound to confirm the proper growth and development of the baby.  An amniocentesis to check for possible genetic problems.  Fetal screens for  spina bifida and Down syndrome.  You may need other tests to make sure you and the baby are doing well. HOME CARE INSTRUCTIONS  Medicines  Follow your health care provider's instructions regarding medicine use. Specific medicines may be either safe or unsafe to take during pregnancy.  Take your prenatal vitamins as directed.  If you develop constipation, try taking a stool softener if your health care provider approves. Diet  Eat regular, well-balanced meals. Choose a variety of foods, such as meat or vegetable-based protein, fish, milk and low-fat dairy products, vegetables, fruits, and whole grain breads and cereals. Your health care provider will help you determine the amount of weight gain that is right for you.  Avoid raw meat and uncooked cheese. These carry germs that can cause birth defects in the baby.  Eating four or five small meals rather than three large meals a day may help relieve nausea and vomiting. If you start to feel nauseous, eating a few soda crackers can be helpful. Drinking liquids between meals instead of during meals also seems to help nausea and vomiting.  If you develop constipation, eat more high-fiber foods, such as fresh vegetables or fruit and whole grains. Drink enough fluids to keep your urine clear or pale yellow. Activity and Exercise  Exercise only as directed by your health care provider. Exercising will help you:  Control your weight.  Stay in shape.  Be prepared for labor and delivery.  Experiencing pain or cramping in the lower abdomen or low back is a good sign that you should stop exercising. Check with your health care provider before continuing normal exercises.  Try to avoid standing for long periods of time. Move your legs often if you must stand in one place for a long time.  Avoid heavy lifting.  Wear low-heeled shoes, and practice good posture.  You may continue to have sex unless your health care provider directs you  otherwise. Relief of Pain or Discomfort  Wear a good support bra for breast tenderness.   Take warm sitz baths to soothe any pain or discomfort caused by hemorrhoids. Use hemorrhoid cream if your health care provider approves.   Rest with your legs elevated if you have leg cramps or low back pain.  If you develop varicose veins in your legs, wear support hose. Elevate your feet for 15 minutes, 3-4 times a day. Limit salt in your diet. Prenatal Care  Schedule your prenatal visits by the twelfth week of pregnancy. They are usually scheduled monthly at first, then more often in the last 2 months before delivery.  Write down your questions. Take them to your prenatal visits.  Keep all your prenatal visits as directed by your health care provider. Safety  Wear your seat belt at all times when driving.  Make a list of emergency phone numbers, including numbers for family, friends, the hospital, and police and fire departments. General Tips    Ask your health care provider for a referral to a local prenatal education class. Begin classes no later than at the beginning of month 6 of your pregnancy.  Ask for help if you have counseling or nutritional needs during pregnancy. Your health care provider can offer advice or refer you to specialists for help with various needs.  Do not use hot tubs, steam rooms, or saunas.  Do not douche or use tampons or scented sanitary pads.  Do not cross your legs for long periods of time.  Avoid cat litter boxes and soil used by cats. These carry germs that can cause birth defects in the baby and possibly loss of the fetus by miscarriage or stillbirth.  Avoid all smoking, herbs, alcohol, and medicines not prescribed by your health care provider. Chemicals in these affect the formation and growth of the baby.  Schedule a dentist appointment. At home, brush your teeth with a soft toothbrush and be gentle when you floss. SEEK MEDICAL CARE IF:   You have  dizziness.  You have mild pelvic cramps, pelvic pressure, or nagging pain in the abdominal area.  You have persistent nausea, vomiting, or diarrhea.  You have a bad smelling vaginal discharge.  You have pain with urination.  You notice increased swelling in your face, hands, legs, or ankles. SEEK IMMEDIATE MEDICAL CARE IF:   You have a fever.  You are leaking fluid from your vagina.  You have spotting or bleeding from your vagina.  You have severe abdominal cramping or pain.  You have rapid weight gain or loss.  You vomit blood or material that looks like coffee grounds.  You are exposed to German measles and have never had them.  You are exposed to fifth disease or chickenpox.  You develop a severe headache.  You have shortness of breath.  You have any kind of trauma, such as from a fall or a car accident. Document Released: 03/21/2001 Document Revised: 08/11/2013 Document Reviewed: 02/04/2013 Exit[employer] Patient Information 2015 [employer]. This information is not intended to replace advice given to you by your health care provider. Make sure you discuss any questions you have with your health care provider.  

## 2014-10-09 ENCOUNTER — Encounter (HOSPITAL_COMMUNITY): Payer: Self-pay | Admitting: Medical

## 2014-10-09 ENCOUNTER — Ambulatory Visit (HOSPITAL_COMMUNITY)
Admit: 2014-10-09 | Discharge: 2014-10-09 | Disposition: A | Discharge status: Home / Self Care | Payer: PRIVATE HEALTH INSURANCE | Attending: Family | Admitting: Family

## 2014-10-09 ENCOUNTER — Inpatient Hospital Stay (HOSPITAL_COMMUNITY)
Admission: AD | Admit: 2014-10-09 | Discharge: 2014-10-09 | Disposition: A | Discharge status: Home / Self Care | Payer: PRIVATE HEALTH INSURANCE | Source: Ambulatory Visit | Attending: Family Medicine | Admitting: Family Medicine

## 2014-10-09 DIAGNOSIS — O3481 Maternal care for other abnormalities of pelvic organs, first trimester: Secondary | ICD-10-CM | POA: Insufficient documentation

## 2014-10-09 DIAGNOSIS — O26899 Other specified pregnancy related conditions, unspecified trimester: Secondary | ICD-10-CM

## 2014-10-09 DIAGNOSIS — Z36 Encounter for antenatal screening of mother: Secondary | ICD-10-CM | POA: Insufficient documentation

## 2014-10-09 DIAGNOSIS — O09511 Supervision of elderly primigravida, first trimester: Secondary | ICD-10-CM

## 2014-10-09 DIAGNOSIS — N831 Corpus luteum cyst: Secondary | ICD-10-CM | POA: Insufficient documentation

## 2014-10-09 DIAGNOSIS — Z3A01 Less than 8 weeks gestation of pregnancy: Secondary | ICD-10-CM | POA: Insufficient documentation

## 2014-10-09 DIAGNOSIS — R109 Unspecified abdominal pain: Principal | ICD-10-CM

## 2014-10-09 NOTE — [facility] Provider Note (Signed)
Crystal Gibbs is a 39 y.o. G2P0010 at [redacted]w[redacted]d who presents to [facility] today for follow-up US results. She denies abdominal pain, vaginal bleeding, N/V or fever today. Patient states concern for subsequent miscarriage after previous SAB. 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 She also states that recently she was diagnosed with hypercalcemia by her PCP and she has a history of elevated prolactin levels.   LMP 08/21/2014 (Exact Date)  GENERAL: Well-developed, well-nourished female in no acute distress.  HEENT: Normocephalic, atraumatic.   LUNGS: Effort normal HEART: Regular rate  SKIN: Warm, dry and without erythema PSYCH: Normal mood and affect  US Ob Transvaginal  10/09/2014   CLINICAL DATA:  Subsequent encounter for positive pregnancy test.  EXAM: TRANSVAGINAL OB ULTRASOUND  TECHNIQUE: Transvaginal ultrasound was performed for complete evaluation of the gestation as well as the maternal uterus, adnexal regions, and pelvic cul-de-sac.  COMPARISON:  09/30/2014.  FINDINGS: Intrauterine gestational sac: Visualized.  Yolk sac:  Visualized.  Embryo:  Not visualized.  MSD: 7.6  mm   5 w   3  d  [place] EDC: 06/08/2015.  Maternal uterus/adnexae: No evidence for free fluid in the cul-de-sac. Corpus luteum cyst identified in the left ovary with normal appearing maternal ovaries bilaterally. No evidence for subchorionic hemorrhage.  IMPRESSION: Single intrauterine gestational sac identified at estimated 5 week 3 day gestational age by crown-rump length. Repeat ultrasound in 7-10 days could be used to assess for continued appropriate pregnancy progression.   Electronically Signed   By: Misty Stanley M.D.   On: 10/09/2014 15:04    A: IUGS and YS at [redacted]w[redacted]d  P: Discharge home First trimester warning signs discussed Pregnancy confirmation letter given List of OTC  medications safe in pregnancy given Referred to [facility] to start prenatal care. They will call with an appointment ASAP Patient may return to [facility] as needed or if her condition were to change or worsen   Luvenia Redden, PA-C  10/09/2014 3:48 PM

## 2014-10-09 NOTE — Discharge Instructions (Signed)
First Trimester of Pregnancy The first trimester of pregnancy is from week 1 until the end of week 12 (months 1 through 3). During this time, your baby will begin to develop inside you. At 6-8 weeks, the eyes and face are formed, and the heartbeat can be seen on ultrasound. At the end of 12 weeks, all the baby's organs are formed. Prenatal care is all the medical care you receive before the birth of your baby. Make sure you get good prenatal care and follow all of your doctor's instructions. HOME CARE  Medicines  Take medicine only as told by your doctor. Some medicines are safe and some are not during pregnancy.  Take your prenatal vitamins as told by your doctor.  Take medicine that helps you poop (stool softener) as needed if your doctor says it is okay. Diet  Eat regular, healthy meals.  Your doctor will tell you the amount of weight gain that is right for you.  Avoid raw meat and uncooked cheese.  If you feel sick to your stomach (nauseous) or throw up (vomit):  Eat 4 or 5 small meals a day instead of 3 large meals.  Try eating a few soda crackers.  Drink liquids between meals instead of during meals.  If you have a hard time pooping (constipation):  Eat high-fiber foods like fresh vegetables, fruit, and whole grains.  Drink enough fluids to keep your pee (urine) clear or pale yellow. Activity and Exercise  Exercise only as told by your doctor. Stop exercising if you have cramps or pain in your lower belly (abdomen) or low back.  Try to avoid standing for long periods of time. Move your legs often if you must stand in one place for a long time.  Avoid heavy lifting.  Wear low-heeled shoes. Sit and stand up straight.  You can have sex unless your doctor tells you not to. Relief of Pain or Discomfort  Wear a good support bra if your breasts are sore.  Take warm water baths (sitz baths) to soothe pain or discomfort caused by hemorrhoids. Use hemorrhoid cream if your  doctor says it is okay.  Rest with your legs raised if you have leg cramps or low back pain.  Wear support hose if you have puffy, bulging veins (varicose veins) in your legs. Raise (elevate) your feet for 15 minutes, 3-4 times a day. Limit salt in your diet. Prenatal Care  Schedule your prenatal visits by the twelfth week of pregnancy.  Write down your questions. Take them to your prenatal visits.  Keep all your prenatal visits as told by your doctor. Safety  Wear your seat belt at all times when driving.  Make a list of emergency phone numbers. The list should include numbers for family, friends, the hospital, and police and fire departments. General Tips  Ask your doctor for a referral to a local prenatal class. Begin classes no later than at the start of month 6 of your pregnancy.  Ask for help if you need counseling or help with nutrition. Your doctor can give you advice or tell you where to go for help.  Do not use hot tubs, steam rooms, or saunas.  Do not douche or use tampons or scented sanitary pads.  Do not cross your legs for long periods of time.  Avoid litter boxes and soil used by cats.  Avoid all smoking, herbs, and alcohol. Avoid drugs not approved by your doctor.  Visit your dentist. At home, brush your teeth   with a soft toothbrush. Be gentle when you floss. GET HELP IF:  You are dizzy.  You have mild cramps or pressure in your lower belly.  You have a nagging pain in your belly area.  You continue to feel sick to your stomach, throw up, or have watery poop (diarrhea).  You have a bad smelling fluid coming from your vagina.  You have pain with peeing (urination).  You have increased puffiness (swelling) in your face, hands, legs, or ankles. GET HELP RIGHT AWAY IF:   You have a fever.  You are leaking fluid from your vagina.  You have spotting or bleeding from your vagina.  You have very bad belly cramping or pain.  You gain or lose weight  rapidly.  You throw up blood. It may look like coffee grounds.  You are around people who have German measles, fifth disease, or chickenpox.  You have a very bad headache.  You have shortness of breath.  You have any kind of trauma, such as from a fall or a car accident. Document Released: 09/13/2007 Document Revised: 08/11/2013 Document Reviewed: 02/04/2013 ExitCare Patient Information 2015 [employer]. This information is not intended to replace advice given to you by your health care provider. Make sure you discuss any questions you have with your health care provider.  

## 2014-10-15 ENCOUNTER — Encounter: Payer: Self-pay | Admitting: Obstetrics & Gynecology

## 2014-11-12 ENCOUNTER — Encounter: Payer: PRIVATE HEALTH INSURANCE | Admitting: Obstetrics & Gynecology

## 2014-12-01 ENCOUNTER — Other Ambulatory Visit (HOSPITAL_COMMUNITY): Payer: Self-pay | Admitting: Obstetrics and Gynecology

## 2014-12-01 DIAGNOSIS — Z5189 Encounter for other specified aftercare: Principal | ICD-10-CM

## 2014-12-01 DIAGNOSIS — O039 Complete or unspecified spontaneous abortion without complication: Secondary | ICD-10-CM

## 2014-12-03 ENCOUNTER — Ambulatory Visit (HOSPITAL_COMMUNITY)
Admission: RE | Admit: 2014-12-03 | Discharge: 2014-12-03 | Disposition: A | Discharge status: Home / Self Care | Payer: Medicaid Other | Source: Ambulatory Visit | Attending: Obstetrics and Gynecology | Admitting: Obstetrics and Gynecology

## 2014-12-03 DIAGNOSIS — O039 Complete or unspecified spontaneous abortion without complication: Secondary | ICD-10-CM

## 2014-12-03 DIAGNOSIS — Z5189 Encounter for other specified aftercare: Secondary | ICD-10-CM

## 2015-01-07 ENCOUNTER — Other Ambulatory Visit: Payer: Self-pay | Admitting: Obstetrics and Gynecology

## 2015-02-09 DIAGNOSIS — E210 Primary hyperparathyroidism: Secondary | ICD-10-CM | POA: Insufficient documentation

## 2015-02-19 DIAGNOSIS — D351 Benign neoplasm of parathyroid gland: Secondary | ICD-10-CM | POA: Insufficient documentation

## 2015-02-19 HISTORY — PX: PARATHYROIDECTOMY: SHX19

## 2015-03-23 ENCOUNTER — Encounter: Payer: Self-pay | Admitting: *Deleted

## 2015-03-23 DIAGNOSIS — I158 Other secondary hypertension: Secondary | ICD-10-CM

## 2015-03-24 ENCOUNTER — Encounter: Payer: Self-pay | Admitting: *Deleted

## 2015-03-24 ENCOUNTER — Ambulatory Visit: Payer: PRIVATE HEALTH INSURANCE

## 2015-03-24 DIAGNOSIS — I10 Essential (primary) hypertension: Secondary | ICD-10-CM

## 2015-03-24 NOTE — Congregational Nurse Program (Signed)
Congregational Nurse Program Note  Date of Encounter: 03/24/2015  Past Medical History: Past Medical History  Diagnosis Date  . Medical history non-contributory   . Fibroid     Encounter Details:     CNP Questionnaire - 03/24/15 1118    Patient Demographics   Is this a new or existing patient? Existing   Patient Assistance   Patient's financial/insurance status Uninsured   Patient referred to apply for the following financial assistance [employer] assistance No   Assistance securing medications No   Encounter Details   Patient referred to Establish PCP   Was a mental health screening completed? (GAINS tool) No   Does patient have dental issues? No   Since previous encounter, have you referred patient for abnormal blood pressure that resulted in a new diagnosis or medication change? No  this is first visit with me only wanted [employer] application and MD referral [facility] appt 03/24/15    Since previous encounter, have you referred patient for abnormal blood glucose that resulted in a new diagnosis or medication change? No   For [employer]straction Use Only   Does patient have insurance? No  husband         Amb Nursing Assessment - 03/24/15 1126    Pre-visit preparation   Pre-visit preparation completed Yes   Pain Assessment   Pain Assessment No/denies pain   Nutrition Screen   Nutritional Risks None   Diabetes No   Functional Status   Activities of Daily Living Independent   Ambulation Independent   Medication Administration Independent   Home Management Independent   Risk/Barriers  Assessment   Barriers to Care Management & Learning Language   Psychosocial Barriers Uninsured/under-insured   Abuse/Neglect Assessment   Do you feel unsafe in your current relationship? Yes   Do you feel physically threatened by others? No   Anyone hurting you at home, work, or school? No   Information provided on Community  resources Yes   Patient Literacy   How often do you need to have someone help you when you read instructions, pamphlets, or other written materials from your doctor or pharmacy? 4 - Often   Comments   Comments patient aware of BP and says she only needs renewal of [employer] appication and app done      Has no complaints at this time needed  Renewal of [employer] done

## 2015-03-24 NOTE — Congregational Nurse Program (Signed)
Patient had no chief complaints . She only wanted assistance with the [facility] and to make appointment which was  done

## 2015-04-08 ENCOUNTER — Ambulatory Visit: Payer: PRIVATE HEALTH INSURANCE | Admitting: Internal Medicine

## 2015-04-13 NOTE — Congregational Nurse Program (Signed)
Congregational Nurse Program Note  Date of Encounter: 03/23/2015  Past Medical History: Past Medical History  Diagnosis Date  . Medical history non-contributory   . Fibroid     Encounter Details:     CNP Questionnaire - 03/24/15 1118    Patient Demographics   Is this a new or existing patient? Existing   Patient Assistance   Patient's financial/insurance status Uninsured   Patient referred to apply for the following financial assistance [employer] assistance No   Assistance securing medications No   Encounter Details   Patient referred to Establish PCP   Was a mental health screening completed? (GAINS tool) No   Does patient have dental issues? No   Since previous encounter, have you referred patient for abnormal blood pressure that resulted in a new diagnosis or medication change? No  this is first visit with me only wanted [employer] application and MD referral [facility] appt 03/24/15    Since previous encounter, have you referred patient for abnormal blood glucose that resulted in a new diagnosis or medication change? No   For Abstraction Use Only   Does patient have insurance? No  husband         Amb Nursing Assessment - 03/24/15 1459    Nutrition Screen   CBG done? Yes   CBG resulted in Enter/ Edit results? --  [id number]    Patients visit on 03/23/15 was mainly to get [employer] and appointment with [facility]/ checked BP for her and Glucose

## 2015-07-22 ENCOUNTER — Ambulatory Visit (HOSPITAL_COMMUNITY)
Admission: EM | Admit: 2015-07-22 | Discharge: 2015-07-22 | Disposition: A | Discharge status: Home / Self Care | Payer: Medicaid Other

## 2015-07-23 ENCOUNTER — Encounter (HOSPITAL_COMMUNITY): Payer: Self-pay | Admitting: Radiology

## 2015-07-23 ENCOUNTER — Ambulatory Visit (INDEPENDENT_AMBULATORY_CARE_PROVIDER_SITE_OTHER): Payer: Medicaid Other

## 2015-07-23 ENCOUNTER — Ambulatory Visit (HOSPITAL_COMMUNITY)
Admission: EM | Admit: 2015-07-23 | Discharge: 2015-07-23 | Disposition: A | Discharge status: Home / Self Care | Payer: Medicaid Other | Attending: Family Medicine | Admitting: Family Medicine

## 2015-07-23 DIAGNOSIS — R222 Localized swelling, mass and lump, trunk: Secondary | ICD-10-CM | POA: Diagnosis not present

## 2015-07-23 NOTE — ED Provider Notes (Signed)
CSN: [id number]     Arrival date & time 07/23/15  1439 History   First MD Initiated Contact with Patient 07/23/15 1716     No chief complaint on file.  (Consider location/radiation/quality/duration/timing/severity/associated sxs/prior Treatment) HPI Comments: 40 year old female presents to the urgent care for concern of swelling to the left anterior chest. This started 4 months ago at approximately the same time in which she had thyroid surgery. There are no new symptoms today, it is not worse today. It is not clear as to the reason that prompted today's visit. There is no associated pain of the chest, shoulder or left upper extremity. No history of injury. The patient points to the left anterior chest to include a portion of the anterior shoulder and under the axilla. Denies breast changes. She was advised by her thyroid surgeon to see her PCP. It is not clear as to what type of clinician she saw yesterday but was advised to have a chest x-ray, mammogram and lab work however, this clinician was not able to order these or follow-up with results.   Past Medical History  Diagnosis Date  . Medical history non-contributory   . Fibroid    Past Surgical History  Procedure Laterality Date  . Cataract extraction     No family history on file. Social History  Substance Use Topics  . Smoking status: Never Smoker   . Smokeless tobacco: Not on file  . Alcohol Use: No   OB History    Gravida Para Term Preterm AB TAB SAB Ectopic Multiple Living   2    1  1         Review of Systems  Constitutional: Negative.  Negative for fever, activity change and fatigue.  HENT: Negative.   Respiratory: Negative.  Negative for cough, choking, chest tightness, shortness of breath, wheezing and stridor.   Cardiovascular: Negative for chest pain and palpitations.  Gastrointestinal: Negative.   Genitourinary: Negative.   Musculoskeletal: Negative.   Skin: Negative for color change and rash.  Neurological:  Negative.   Hematological: Negative.   All other systems reviewed and are negative.   Allergies  Review of patient's allergies indicates no known allergies.  Home Medications   Prior to Admission medications   Medication Sig Start Date End Date Taking? Authorizing Provider  prenatal vitamin w/FE, FA (PRENATAL 1 + 1) 27-1 MG TABS tablet Take 1 tablet by mouth daily. 09/30/14   Deirdre Freida Busman, CNM   Meds Ordered and Administered this Visit  Medications - No data to display  BP 132/85 mmHg  Pulse 66  Temp(Src) 97.6 F (36.4 C) (Oral)  SpO2 100%  LMP 07/09/2014  Breastfeeding? Unknown No data found.   Physical Exam  Constitutional: She is oriented to person, place, and time. She appears well-developed and well-nourished. No distress.  HENT:  Mouth/Throat: Oropharynx is clear and moist. No oropharyngeal exudate.  Eyes: Conjunctivae and EOM are normal.  Neck: Normal range of motion. Neck supple.  Cardiovascular: Normal rate and regular rhythm.   Pulmonary/Chest: Effort normal and breath sounds normal. No respiratory distress. She has no wheezes. She has no rales. She exhibits no tenderness.  There is a minor difference and anterior chest symmetry. The left anterior chest to include the fatty tissue near the left axilla seems to have a little more bulk. There is no chest wall tenderness. No tenderness to the soft tissue. No overlying discoloration, increased warmth or other signs of infection. No palpable lumps, bumps, induration or nodes to  the axilla or anterior chest. The area of "swelling" is soft and palpates similar to normal fatty tissue. No tenderness to the bony or musculoskeletal  structures of the left shoulder or upper arm. Demonstrates full range of motion of the left upper extremity. Distal neurovascular motor sensory of the upper extremities intact.  Musculoskeletal: Normal range of motion. She exhibits no edema.  Lymphadenopathy:    She has no cervical adenopathy.   Neurological: She is alert and oriented to person, place, and time. She exhibits normal muscle tone.  Skin: Skin is warm and dry. No rash noted. No erythema.  Psychiatric: She has a normal mood and affect.  Nursing note and vitals reviewed.   ED Course  Procedures (including critical care time)  Labs Review Labs Reviewed - No data to display  Imaging Review Dg Chest 2 View  07/23/2015  CLINICAL DATA:  Left chest region soft tissue swelling EXAM: CHEST  2 VIEW COMPARISON:  June 01, 2014 FINDINGS: There is no edema or consolidation. Heart size and pulmonary vascularity are normal. No adenopathy. There is mid thoracic dextroscoliosis with upper lumbar levoscoliosis. No bone lesions are evident. No soft tissue lesions are appreciable. No pneumothorax. IMPRESSION: Scoliosis. No soft tissue lesions are evident by radiography. Lungs clear. Electronically Signed   By: Lowella Grip III M.D.   On: 07/23/2015 18:49     Visual Acuity Review  Right Eye Distance:   Left Eye Distance:   Bilateral Distance:    Right Eye Near:   Left Eye Near:    Bilateral Near:         MDM   1. Soft tissue swelling of chest wall    Your chest x-ray is normal. Physical exam is essentially normal. The mild soft tissue swelling is noted to the left anterior chest. There are no nodules or other signs of immediate concern at this time. You will need to call  one of the numbers included in your instruction sheets is to obtain a primary care doctor to complete your evaluation.      Janne Napoleon, NP 07/23/15 1905

## 2015-07-23 NOTE — Discharge Instructions (Signed)
Your chest x-ray is normal. Physical exam is essentially normal. The mild soft tissue swelling is noted to the left anterior chest. There are no nodules or other signs of immediate concern at this time. You will need to call  one of the numbers included in your instruction sheets is to obtain a primary care doctor to complete your evaluation.

## 2015-09-02 ENCOUNTER — Ambulatory Visit (INDEPENDENT_AMBULATORY_CARE_PROVIDER_SITE_OTHER): Payer: Self-pay | Admitting: Family Medicine

## 2015-09-02 ENCOUNTER — Encounter: Payer: Self-pay | Admitting: Family Medicine

## 2015-09-02 VITALS — BP 135/95 | HR 79 | Temp 98.4°F | Ht 64.5 in | Wt 181.0 lb

## 2015-09-02 DIAGNOSIS — Z0000 Encounter for general adult medical examination without abnormal findings: Secondary | ICD-10-CM

## 2015-09-02 DIAGNOSIS — E215 Disorder of parathyroid gland, unspecified: Secondary | ICD-10-CM

## 2015-09-02 DIAGNOSIS — Z1231 Encounter for screening mammogram for malignant neoplasm of breast: Secondary | ICD-10-CM

## 2015-09-02 DIAGNOSIS — Z1239 Encounter for other screening for malignant neoplasm of breast: Secondary | ICD-10-CM

## 2015-09-02 LAB — POCT URINALYSIS DIPSTICK
Bilirubin, UA: NEGATIVE
Glucose, UA: NEGATIVE
Ketones, UA: NEGATIVE
LEUKOCYTES UA: NEGATIVE
NITRITE UA: NEGATIVE
PH UA: 6
PROTEIN UA: NEGATIVE
Spec Grav, UA: 1.02
UROBILINOGEN UA: 0.2

## 2015-09-02 LAB — TSH: TSH: 1.05 mIU/L

## 2015-09-02 LAB — CBC
HCT: 38.3 % (ref 35.0–45.0)
HEMOGLOBIN: 13.1 g/dL (ref 11.7–15.5)
MCH: 29.1 pg (ref 27.0–33.0)
MCHC: 34.2 g/dL (ref 32.0–36.0)
MCV: 85.1 fL (ref 80.0–100.0)
MPV: 10.4 fL (ref 7.5–12.5)
PLATELETS: 235 10*3/uL (ref 140–400)
RBC: 4.5 MIL/uL (ref 3.80–5.10)
RDW: 13.8 % (ref 11.0–15.0)
WBC: 4.8 10*3/uL (ref 3.8–10.8)

## 2015-09-02 LAB — POCT UA - MICROSCOPIC ONLY

## 2015-09-02 LAB — POCT URINE PREGNANCY: PREG TEST UR: NEGATIVE

## 2015-09-02 MED ORDER — OMEPRAZOLE 20 MG PO CPDR: 20.0000 mg | DELAYED_RELEASE_CAPSULE | Freq: Every day | ORAL | Status: DC

## 2015-09-02 NOTE — Patient Instructions (Signed)
It was a pleasure seeing you today in our clinic. Today we discussed establishing care. Here is the treatment plan we have discussed and agreed upon together:   - We've ordered some labs to be drawn today. We'll contact you if there are any significant abnormalities that need to be addressed immediately. Otherwise I will mail you these results. - I've sent an order to the [facility] breast center for you to have a mammogram. - I would like to have you follow-up with me in one month for a women's health exam and Pap smear. - If you have any questions or your symptoms seem to be worsening and please call to make a follow-up appointment sooner than one month.

## 2015-09-02 NOTE — Progress Notes (Signed)
Crystal Lynch, MD, MS Phone: [phone]  Subjective:  CC -- Annual Physical  Pt is here to establish care with her PCP. She states that she is relatively healthy without having any significant issues. She does state however that over the past few weeks she has experienced left-sided swelling in her axilla/breast. She did recently have this evaluated at urgent care without any significant findings at that time. She denies any pain in this region. No significant discomfort or rash. No decreased range of motion.  Patient denies any other concerns at this time. Patient is from [place] and is married. Her husband is still located in [place] and she is joined by her brother for today's visit. She denies any past medical history of any significance, with the exception of hyperparathyroidism which is status post partial parathyroidectomy.  She denies any weight loss, weight gain, headache, fever, chills, anorexia, dizziness, lightheadedness, vision changes, dysphagia, shortness of breath, chest pain, nausea, vomiting, diarrhea, abdominal pain, constipation, dysuria, melena, or hematochezia.  Cardiovascular: - Risk as of 09/03/2015: Unknown - Dx Hypertension: unknown  - Dx Hyperlipidemia: unknown - Dx Obesity: no, but overweight. BMI 30.6 - Physical Activity: no  - Diabetes: unknown  Cancer: Colorectal >> Colonoscopy: no  Lung >> Tobacco Use: no  Breast >> Mammogram: no   - Sister w/ Breast CA dx at age 13yo Cervical/Endometrial >>  - Postmenopausal: no  - Vaginal Bleeding: yes - Pap Smear: no   - Previous Abnormal Pap: never done before Skin >> Suspicious lesions: no   Social: Alcohol Use: no  Tobacco Use: no   - Interested in Quitting: n/a  Other Drugs: no  Risky Sexual Behavior: no  Depression: no   - PHQ9 score:  Support and Life at Home: yes   Other: Osteoporosis: no Zoster Vaccine: no  Flu Vaccine: no  Pneumonia Vaccine: no   ROS- per history of present  illness  Past Medical History Patient Active Problem List   Diagnosis Date Noted  . Health care maintenance 09/03/2015  . Breast cancer screening, high risk patient 09/03/2015  . 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 AMA (advanced maternal age) primigravida 35+ 09/30/2014    Medications- reviewed and updated Current Outpatient Prescriptions  Medication Sig Dispense Refill  . omeprazole (PRILOSEC) 20 MG capsule Take 1 capsule (20 mg total) by mouth daily. 30 capsule 3  . prenatal vitamin w/FE, FA (PRENATAL 1 + 1) 27-1 MG TABS tablet Take 1 tablet by mouth daily. 30 each 0   No current facility-administered medications for this visit.    Objective: BP 135/95 mmHg  Pulse 79  Temp(Src) 98.4 F (36.9 C) (Oral)  Ht 5' 4.5" (1.638 m)  Wt 181 lb (82.101 kg)  BMI 30.60 kg/m2  SpO2 100%  LMP 08/27/2015 Gen: NAD, alert, cooperative with exam HEENT: NCAT, EOMI, PERRL CV: RRR, good S1/S2, no murmur Resp: CTABL, no wheezes, non-labored Abd: Soft, Non Tender, Non Distended, BS present, no guarding or organomegaly Breast exam: No chest wall tenderness, no evidence of discoloration, warmth, induration. No palpable nodules or lumps within the left breast or axilla tissue. No signs of dimpling or obvious skin changes. Genital Exam: not done Ext: No edema, warm Neuro: Alert and oriented, No gross deficits   Assessment/Plan:  Health care maintenance Patient is here to establish care with a new primary care provider. - Health maintenance labs obtained today. - I have asked patient to follow-up with me in one month for Pap smear.  Breast cancer screening,  high risk patient Patient is complaining of left-sided breast/axilla "swelling". Physical exam was benign today in the office. With that said, patient is at increased risk for breast cancer with a first-degree relative receiving a diagnosis of breast cancer at an early age. - Bilateral mammogram due to increased risk of breast cancer with  positive family history.    Orders Placed This Encounter  Procedures  . MM Digital Screening    [id number] / no prob/ no implants / no hx of bc / medicaid / pt / mdc epic order     Standing Status: Future     Number of Occurrences:      Standing Expiration Date: 11/01/2016    Order Specific Question:  Reason for Exam (SYMPTOM  OR DIAGNOSIS REQUIRED)    Answer:  High risk; Family Hx    Order Specific Question:  Is the patient pregnant?    Answer:  No    Order Specific Question:  Preferred imaging location?    Answer:  [facility]  . CBC  . TSH  . Lipid panel  . COMPLETE METABOLIC PANEL WITH GFR  . POCT urinalysis dipstick  . POCT urine pregnancy  . POCT UA - Microscopic Only    Meds ordered this encounter  Medications  . omeprazole (PRILOSEC) 20 MG capsule    Sig: Take 1 capsule (20 mg total) by mouth daily.    Dispense:  30 capsule    Refill:  3     Elberta Leatherwood, MD,MS,  PGY2 09/03/2015 1:14 AM

## 2015-09-03 DIAGNOSIS — Z1239 Encounter for other screening for malignant neoplasm of breast: Secondary | ICD-10-CM | POA: Insufficient documentation

## 2015-09-03 DIAGNOSIS — Z0000 Encounter for general adult medical examination without abnormal findings: Secondary | ICD-10-CM | POA: Insufficient documentation

## 2015-09-03 LAB — COMPLETE METABOLIC PANEL WITH GFR
ALBUMIN: 4.2 g/dL (ref 3.6–5.1)
ALK PHOS: 62 U/L (ref 33–115)
ALT: 10 U/L (ref 6–29)
AST: 14 U/L (ref 10–30)
BILIRUBIN TOTAL: 0.5 mg/dL (ref 0.2–1.2)
BUN: 12 mg/dL (ref 7–25)
CALCIUM: 9.1 mg/dL (ref 8.6–10.2)
CO2: 23 mmol/L (ref 20–31)
Chloride: 101 mmol/L (ref 98–110)
Creat: 0.81 mg/dL (ref 0.50–1.10)
Glucose, Bld: 82 mg/dL (ref 65–99)
POTASSIUM: 3.8 mmol/L (ref 3.5–5.3)
Sodium: 137 mmol/L (ref 135–146)
TOTAL PROTEIN: 7.2 g/dL (ref 6.1–8.1)

## 2015-09-03 LAB — LIPID PANEL
Cholesterol: 204 mg/dL — ABNORMAL HIGH (ref 125–200)
HDL: 37 mg/dL — AB (ref >=46)
LDL Cholesterol: 140 mg/dL — ABNORMAL HIGH (ref <130)
Total CHOL/HDL Ratio: 5.5 Ratio — ABNORMAL HIGH (ref <=5.0)
Triglycerides: 136 mg/dL (ref <150)
VLDL: 27 mg/dL (ref <30)

## 2015-09-03 NOTE — Assessment & Plan Note (Signed)
Patient is complaining of left-sided breast/axilla "swelling". Physical exam was benign today in the office. With that said, patient is at increased risk for breast cancer with a first-degree relative receiving a diagnosis of breast cancer at an early age. - Bilateral mammogram due to increased risk of breast cancer with positive family history.

## 2015-09-03 NOTE — Assessment & Plan Note (Addendum)
Patient is here to establish care with a new primary care provider. - Health maintenance labs obtained today. - I have asked patient to follow-up with me in one month for Pap smear.

## 2015-09-07 ENCOUNTER — Encounter: Payer: Self-pay | Admitting: Family Medicine

## 2015-09-16 ENCOUNTER — Ambulatory Visit
Admission: RE | Admit: 2015-09-16 | Discharge: 2015-09-16 | Disposition: A | Discharge status: Home / Self Care | Payer: Medicaid Other | Source: Ambulatory Visit | Attending: Family Medicine | Admitting: Family Medicine

## 2015-09-16 DIAGNOSIS — Z1239 Encounter for other screening for malignant neoplasm of breast: Secondary | ICD-10-CM

## 2017-04-01 ENCOUNTER — Inpatient Hospital Stay (HOSPITAL_COMMUNITY)
Admission: AD | Admit: 2017-04-01 | Discharge: 2017-04-01 | Disposition: A | Discharge status: Home / Self Care | Payer: Self-pay | Source: Ambulatory Visit | Attending: Obstetrics & Gynecology | Admitting: Obstetrics & Gynecology

## 2017-04-01 ENCOUNTER — Encounter (HOSPITAL_COMMUNITY): Payer: Self-pay

## 2017-04-01 ENCOUNTER — Other Ambulatory Visit: Payer: Self-pay

## 2017-04-01 DIAGNOSIS — Z79899 Other long term (current) drug therapy: Secondary | ICD-10-CM | POA: Insufficient documentation

## 2017-04-01 DIAGNOSIS — M545 Low back pain: Secondary | ICD-10-CM

## 2017-04-01 DIAGNOSIS — M549 Dorsalgia, unspecified: Secondary | ICD-10-CM | POA: Insufficient documentation

## 2017-04-01 DIAGNOSIS — Z3202 Encounter for pregnancy test, result negative: Secondary | ICD-10-CM | POA: Insufficient documentation

## 2017-04-01 LAB — URINALYSIS, ROUTINE W REFLEX MICROSCOPIC
Bilirubin Urine: NEGATIVE
Glucose, UA: NEGATIVE mg/dL
Ketones, ur: NEGATIVE mg/dL
Nitrite: NEGATIVE
PH: 6 (ref 5.0–8.0)
Protein, ur: NEGATIVE mg/dL
SPECIFIC GRAVITY, URINE: 1.014 (ref 1.005–1.030)

## 2017-04-01 LAB — POCT PREGNANCY, URINE: Preg Test, Ur: NEGATIVE

## 2017-04-01 LAB — HCG, QUANTITATIVE, PREGNANCY

## 2017-04-01 NOTE — Progress Notes (Addendum)
G2P0 not sure of pregn but had IVF in [place]. Last LMP was 11/17. Denies bleeding. Has ha and back pain  Pt request nothing goes into her vagina. Provider made aware.   UPT negative  1800: provider at bs assessing pt and using stratus.   Lab ordered   1925: D/c instruction given with use of sratus interpreter. Pt left unit via ambulatory with friend.

## 2017-04-01 NOTE — Discharge Instructions (Signed)
Human Chorionic Gonadotropin Test °Human chorionic gonadotropin (hCG) is a hormone produced during pregnancy by the cells that form the placenta. The placenta is the organ that grows inside your womb (uterus) to nourish a developing baby. When you are pregnant, hCG starts to appear in your blood about 11 days after conception. It continues to go up for the first 8-11 weeks of pregnancy. °Your hCG level can be measured with several different types of tests. You may have: °· A urine test. °? hCG is eliminated from your body by your kidneys, so a urine test is one way to check for this hormone. °? A urine test only shows whether there is hCG in your urine. It does not measure how much. °? You may have a urine test to find out whether you are pregnant. °? A home pregnancy test detects whether there is hCG in your urine. °· A qualitative blood test. °? Like the urine test, this blood test only shows whether there is hCG in your blood. It does not measure how much. °? You may have this type of blood test to find out whether you are pregnant. °· A quantitative blood test. °? This type of blood test measures the amount of hCG in your blood. °? You may have this type of test to diagnose an abnormal pregnancy or determine whether you are at risk of, or have had, a failed pregnancy (miscarriage). ° °How do I prepare for this test? °For the urine test: °· Limit your fluid intake before the urine test as directed by your health care provider. °· Collect the sample the first time you urinate in the morning. °· Let your health care provider know if you have blood in your urine. This may interfere with the test result. ° °Some medicines may interfere with the urine and blood tests. Let your health care provider know about all the medicines you are taking. No additional preparation is required for the blood test. °What do the results mean? °It is your responsibility to obtain your test results. Ask the lab or department performing  the test when and how you will get your results. Talk to your health care provider if you have any questions about your test results. °The results of the hCG urine test and the qualitative hCG blood test are either positive or negative. The results of the quantitative hCG blood test are reported as a number. hCG is measured in international units per liter (IU/L). °Meaning of Negative Test Results °A negative result on a urine or qualitative blood test could mean that you are not pregnant. It could also mean the test was done too early to detect hCG. If you still have other signs of pregnancy, the test should be repeated. °Meaning of Positive Test Results °A positive result on the urine or qualitative blood tests means you are most likely pregnant. Your health care provider may confirm your pregnancy with an imaging study of the inside of your uterus at 5-6 weeks (ultrasound). °Range of Normal Values °Ranges for normal values for the quantitative hCG blood test may vary among different labs and hospitals. You should always check with your health care provider after having lab work or other tests done to discuss whether your values are considered within normal limits. °· Less than 5 IU/L means it is most likely you are not pregnant. °· Greater than 25 IU/L means it is most likely you are pregnant. ° °Meaning of Results Outside Normal Value Ranges °If your hCG   level on the quantitative test is not what would be expected, you may have the test again. It may also be important for your health care provider to know whether your hCG level goes up or down over time. Common causes of results outside the normal range include: °· Being pregnant with twins (hCG level is higher than expected). °· Having an ectopic pregnancy (hCG rises more slowly than expected). °· Miscarriage (hCG level falls). °· Abnormal growths in the womb (hCG level is higher than expected). ° °Talk with your health care provider to discuss your results,  treatment options, and if necessary, the need for more tests. Talk with your health care provider if you have any questions about your results. °This information is not intended to replace advice given to you by your health care provider. Make sure you discuss any questions you have with your health care provider. °Document Released: 04/28/2004 Document Revised: 12/01/2015 Document Reviewed: 07/01/2013 °Elsevier Interactive Patient Education © 2018 [employer] ° °

## 2017-04-01 NOTE — MAU Provider Note (Signed)
History     CSN: [id number]  Arrival date and time: 04/01/17 1713   None     Chief Complaint  Patient presents with  . Possible Pregnancy  . Back Pain   HPI 41 yo G2P0020 presenting today for pregnancy evaluation. Patient reports IVF in [place] with embryo transfer 03/18/2017. Patient was instructed to obtain quant HCG 2 weeks following embryo transfer to confirm pregnancy. Patient reports feeling tired as [place]. She reports a mild headache and some back pain. She reports the presence of a brown discharge which she describes as spotting and only noted when wiping. She denies any cramping or abdominal pain.   OB History    Gravida Para Term Preterm AB Living   2 0     1 0   SAB TAB Ectopic Multiple Live Births   1              Past Medical History:  Diagnosis Date  . Fibroid   . Medical history non-contributory     Past Surgical History:  Procedure Laterality Date  . CATARACT EXTRACTION    . PARATHYROIDECTOMY Right 02/19/15    Family History  Problem Relation Age of Onset  . Cancer Mother   . Cancer Father   . Diabetes Father   . Cancer Sister   . Diabetes Sister     Social History   Tobacco Use  . Smoking status: Never Smoker  . Smokeless tobacco: Never Used  Substance Use Topics  . Alcohol use: No  . Drug use: No    Allergies: No Known Allergies  Medications Prior to Admission  Medication Sig Dispense Refill Last Dose  . omeprazole (PRILOSEC) 20 MG capsule Take 1 capsule (20 mg total) by mouth daily. 30 capsule 3   . prenatal vitamin w/FE, FA (PRENATAL 1 + 1) 27-1 MG TABS tablet Take 1 tablet by mouth daily. 30 each 0     Review of Systems Physical Exam   Blood pressure 136/76, pulse 87, temperature 99.2 F (37.3 C), temperature source Oral, resp. rate 18, last menstrual period 02/24/2017, unknown if currently breastfeeding.  Physical Exam GENERAL: Well-developed, well-nourished female in no acute  distress.  HEENT: Normocephalic, atraumatic. Sclerae anicteric.  NECK: Supple. Normal thyroid.  LUNGS: Clear to auscultation bilaterally.  HEART: Regular rate and rhythm. ABDOMEN: Soft, nontender, nondistended. No organomegaly. PELVIC: Patient declined pelvic exam EXTREMITIES: No cyanosis, clubbing, or edema, 2+ distal pulses.  MAU Course  Procedures  MDM  Results for orders placed or performed during the hospital encounter of 04/01/17 (from the past 24 hour(s))  Urinalysis, Routine w reflex microscopic     Status: Abnormal   Collection Time: 04/01/17  5:30 PM  Result Value Ref Range   Color, Urine YELLOW YELLOW   APPearance HAZY (A) CLEAR   Specific Gravity, Urine 1.014 1.005 - 1.030   pH 6.0 5.0 - 8.0   Glucose, UA NEGATIVE NEGATIVE mg/dL   Hgb urine dipstick MODERATE (A) NEGATIVE   Bilirubin Urine NEGATIVE NEGATIVE   Ketones, ur NEGATIVE NEGATIVE mg/dL   Protein, ur NEGATIVE NEGATIVE mg/dL   Nitrite NEGATIVE NEGATIVE   Leukocytes, UA TRACE (A) NEGATIVE   RBC / HPF 0-5 0 - 5 RBC/hpf   WBC, UA 6-30 0 - 5 WBC/hpf   Bacteria, UA MANY (A) NONE SEEN   Squamous Epithelial / LPF 0-5 (A) NONE SEEN   Mucus PRESENT   Pregnancy, urine POC  Status: None   Collection Time: 04/01/17  5:37 PM  Result Value Ref Range   Preg Test, Ur NEGATIVE NEGATIVE  hCG, quantitative, pregnancy     Status: None   Collection Time: 04/01/17  6:03 PM  Result Value Ref Range   hCG, Beta Chain, Quant, S <1 <5 mIU/mL     Assessment and Plan  41 yo G2P0020 with negative pregnancy test - Discussed results with patients - Advised to repeat home pregnancy test in 2 weeks of no menses - Advised to continue taking prenatal vitamins  Laith Antonelli 04/01/2017, 7:22 PM

## 2017-04-01 NOTE — MAU Note (Signed)
Patient had IVF in [place], [place] 02/24/17, having back pain today, headache today, denies vaginal bleeding.

## 2017-04-03 LAB — URINE CULTURE: CULTURE: NO GROWTH

## 2019-03-11 ENCOUNTER — Other Ambulatory Visit: Payer: Self-pay

## 2019-03-11 DIAGNOSIS — Z20822 Contact with and (suspected) exposure to covid-19: Secondary | ICD-10-CM

## 2019-03-13 LAB — NOVEL CORONAVIRUS, NAA: SARS-CoV-2, NAA: NOT DETECTED

## 2020-06-29 ENCOUNTER — Ambulatory Visit: Payer: Self-pay | Admitting: Gastroenterology

## 2020-07-02 ENCOUNTER — Other Ambulatory Visit: Payer: Self-pay

## 2020-07-02 DIAGNOSIS — E215 Disorder of parathyroid gland, unspecified: Secondary | ICD-10-CM | POA: Insufficient documentation

## 2020-07-02 DIAGNOSIS — N96 Recurrent pregnancy loss: Secondary | ICD-10-CM | POA: Insufficient documentation

## 2020-07-02 DIAGNOSIS — Z8669 Personal history of other diseases of the nervous system and sense organs: Secondary | ICD-10-CM | POA: Insufficient documentation

## 2020-07-09 ENCOUNTER — Emergency Department (HOSPITAL_COMMUNITY)
Admission: EM | Admit: 2020-07-09 | Discharge: 2020-07-10 | Disposition: A | Discharge status: Home / Self Care | Payer: 59 | Attending: Emergency Medicine | Admitting: Emergency Medicine

## 2020-07-09 ENCOUNTER — Encounter (HOSPITAL_COMMUNITY): Payer: Self-pay

## 2020-07-09 ENCOUNTER — Ambulatory Visit: Payer: Self-pay | Admitting: Nurse Practitioner

## 2020-07-09 DIAGNOSIS — R1110 Vomiting, unspecified: Secondary | ICD-10-CM | POA: Insufficient documentation

## 2020-07-09 DIAGNOSIS — E119 Type 2 diabetes mellitus without complications: Secondary | ICD-10-CM | POA: Diagnosis not present

## 2020-07-09 DIAGNOSIS — R519 Headache, unspecified: Secondary | ICD-10-CM | POA: Insufficient documentation

## 2020-07-09 DIAGNOSIS — R1013 Epigastric pain: Secondary | ICD-10-CM | POA: Diagnosis not present

## 2020-07-09 DIAGNOSIS — Z859 Personal history of malignant neoplasm, unspecified: Secondary | ICD-10-CM | POA: Insufficient documentation

## 2020-07-09 DIAGNOSIS — R509 Fever, unspecified: Secondary | ICD-10-CM | POA: Diagnosis not present

## 2020-07-09 DIAGNOSIS — R1011 Right upper quadrant pain: Secondary | ICD-10-CM

## 2020-07-09 DIAGNOSIS — K769 Liver disease, unspecified: Secondary | ICD-10-CM | POA: Diagnosis not present

## 2020-07-09 LAB — CBC WITH DIFFERENTIAL/PLATELET
Abs Immature Granulocytes: 0.04 10*3/uL (ref 0.00–0.07)
Basophils Absolute: 0.1 10*3/uL (ref 0.0–0.1)
Basophils Relative: 1 %
Eosinophils Absolute: 0.1 10*3/uL (ref 0.0–0.5)
Eosinophils Relative: 2 %
HCT: 41.5 % (ref 36.0–46.0)
Hemoglobin: 13.4 g/dL (ref 12.0–15.0)
Immature Granulocytes: 1 %
Lymphocytes Relative: 30 %
Lymphs Abs: 2.6 10*3/uL (ref 0.7–4.0)
MCH: 28.9 pg (ref 26.0–34.0)
MCHC: 32.3 g/dL (ref 30.0–36.0)
MCV: 89.4 fL (ref 80.0–100.0)
Monocytes Absolute: 0.7 10*3/uL (ref 0.1–1.0)
Monocytes Relative: 8 %
Neutro Abs: 5.1 10*3/uL (ref 1.7–7.7)
Neutrophils Relative %: 58 %
Platelets: 222 10*3/uL (ref 150–400)
RBC: 4.64 MIL/uL (ref 3.87–5.11)
RDW: 13.6 % (ref 11.5–15.5)
WBC: 8.6 10*3/uL (ref 4.0–10.5)
nRBC: 0 % (ref 0.0–0.2)

## 2020-07-09 LAB — URINALYSIS, ROUTINE W REFLEX MICROSCOPIC
Bilirubin Urine: NEGATIVE
Glucose, UA: NEGATIVE mg/dL
Ketones, ur: NEGATIVE mg/dL
Leukocytes,Ua: NEGATIVE
Nitrite: NEGATIVE
Protein, ur: NEGATIVE mg/dL
Specific Gravity, Urine: 1.01 (ref 1.005–1.030)
pH: 7 (ref 5.0–8.0)

## 2020-07-09 LAB — COMPREHENSIVE METABOLIC PANEL
ALT: 145 U/L — ABNORMAL HIGH (ref 0–44)
AST: 186 U/L — ABNORMAL HIGH (ref 15–41)
Albumin: 3.4 g/dL — ABNORMAL LOW (ref 3.5–5.0)
Alkaline Phosphatase: 243 U/L — ABNORMAL HIGH (ref 38–126)
Anion gap: 5 (ref 5–15)
BUN: 10 mg/dL (ref 6–20)
CO2: 30 mmol/L (ref 22–32)
Calcium: 9 mg/dL (ref 8.9–10.3)
Chloride: 99 mmol/L (ref 98–111)
Creatinine, Ser: 0.53 mg/dL (ref 0.44–1.00)
GFR, Estimated: >60 mL/min (ref >60)
Glucose, Bld: 101 mg/dL — ABNORMAL HIGH (ref 70–99)
Potassium: 4.6 mmol/L (ref 3.5–5.1)
Sodium: 134 mmol/L — ABNORMAL LOW (ref 135–145)
Total Bilirubin: 0.8 mg/dL (ref 0.3–1.2)
Total Protein: 7.2 g/dL (ref 6.5–8.1)

## 2020-07-09 LAB — I-STAT BETA HCG BLOOD, ED (MC, WL, AP ONLY): I-stat hCG, quantitative: <5 m[IU]/mL (ref <5)

## 2020-07-09 LAB — LIPASE, BLOOD: Lipase: 30 U/L (ref 11–51)

## 2020-07-09 NOTE — ED Triage Notes (Signed)
Pt reports abd pain, headache, fever and body aches all over her body onset for several weeks however pain has gotten progressively worse. Pt reports after eating,pain begins on right side

## 2020-07-09 NOTE — Progress Notes (Deleted)
     07/09/2020 Tristian Bouska [id number] 07-03-1975   CHIEF COMPLAINT:   HISTORY OF PRESENT ILLNESS:  Past Medical History:  Diagnosis Date  . Cancer ([place])   . Diabetes mellitus without complication ([place])   . Fibroid   . Medical history non-contributory    Past Surgical History:  Procedure Laterality Date  . CATARACT EXTRACTION    . PARATHYROIDECTOMY Right 02/19/15    reports that she has never smoked. She has never used smokeless tobacco. She reports that she does not drink alcohol and does not use drugs. family history includes Cancer in her father, mother, and sister; Diabetes in her father and sister. No Known Allergies    Outpatient Encounter Medications as of 07/09/2020  Medication Sig  . ibuprofen (ADVIL) 600 MG tablet ibuprofen 600 mg tablet  Take 1 tablet every 8 hours by oral route.  Marland Kitchen omeprazole (PRILOSEC) 20 MG capsule Take 1 capsule (20 mg total) by mouth daily.  . prenatal vitamin w/FE, FA (PRENATAL 1 + 1) 27-1 MG TABS tablet Take 1 tablet by mouth daily.  Marland Kitchen triamcinolone (KENALOG) 0.1 % triamcinolone acetonide 0.1 % topical cream  Apply to affected area 3 times daily as needed   No facility-administered encounter medications on file as of 07/09/2020.     REVIEW OF SYSTEMS: All other systems reviewed and negative except where noted in the History of Present Illness.  Gen: Denies fever, sweats or chills. No weight loss.  CV: Denies chest pain, palpitations or edema. Resp: Denies cough, shortness of breath of hemoptysis.  GI: Denies heartburn, dysphagia, stomach or lower abdominal pain. No diarrhea or constipation.  GU : Denies urinary burning, blood in urine, increased urinary frequency or incontinence. MS: Denies joint pain, muscles aches or weakness. Derm: Denies rash, itchiness, skin lesions or unhealing ulcers. Psych: Denies depression, anxiety, memory loss, suicidal ideation and confusion. Heme: Denies bruising, bleeding. Neuro:  Denies headaches, dizziness  or paresthesias. Endo:  Denies any problems with DM, thyroid or adrenal function.    PHYSICAL EXAM: There were no vitals taken for this visit. General: Well developed ... in no acute distress. Head: Normocephalic and atraumatic. Eyes:  Sclerae non-icteric, conjunctive pink. Ears: Normal auditory acuity. Mouth: Dentition intact. No ulcers or lesions.  Neck: Supple, no lymphadenopathy or thyromegaly.  Lungs: Clear bilaterally to auscultation without wheezes, crackles or rhonchi. Heart: Regular rate and rhythm. No murmur, rub or gallop appreciated.  Abdomen: Soft, nontender, non distended. No masses. No hepatosplenomegaly. Normoactive bowel sounds x 4 quadrants.  Rectal:  Musculoskeletal: Symmetrical with no gross deformities. Skin: Warm and dry. No rash or lesions on visible extremities. Extremities: No edema. Neurological: Alert oriented x 4, no focal deficits.  Psychological:  Alert and cooperative. Normal mood and affect.  ASSESSMENT AND PLAN:    CC:  Nuala Alpha, DO

## 2020-07-09 NOTE — ED Provider Notes (Signed)
MSE was initiated and I personally evaluated the patient and placed orders (if any) at  7:07 PM on July 09, 2020.  The patient appears stable so that the remainder of the MSE may be completed by another provider.  Patient is a 45 year old woman who presents today for evaluation of body pain for "a while."  She gets pain when she eats.  Initiation of care has begun. The patient has been counseled on the process, plan, and necessity for staying for the completion/evaluation, and the remainder of the medical screening examination.     Ollen Gross 07/09/20 Einar Crow    Carmin Muskrat, MD 07/09/20 2132

## 2020-07-10 ENCOUNTER — Emergency Department (HOSPITAL_COMMUNITY): Payer: 59

## 2020-07-10 MED ORDER — IOHEXOL 300 MG/ML  SOLN
100.0000 mL | Freq: Once | INTRAMUSCULAR | Status: AC | PRN
  Administered 2020-07-10: 100 mL via INTRAVENOUS

## 2020-07-10 MED ORDER — GADOBUTROL 1 MMOL/ML IV SOLN
8.0000 mL | Freq: Once | INTRAVENOUS | Status: AC | PRN
  Administered 2020-07-10: 8 mL via INTRAVENOUS

## 2020-07-10 MED ORDER — TRAMADOL HCL 50 MG PO TABS
50.0000 mg | ORAL_TABLET | Freq: Four times a day (QID) | ORAL | 0 refills | Status: DC | PRN
Start: 2020-07-10 — End: 2020-07-29

## 2020-07-10 NOTE — Discharge Instructions (Signed)
The gastroenterology specialist should call you within the next week.  If you do not hear from them by next Friday, call them at their office at the listed number.  Return to the ER if you have worsening pain fevers or any additional concerns.

## 2020-07-10 NOTE — ED Notes (Signed)
Patient in MRI 

## 2020-07-10 NOTE — ED Provider Notes (Signed)
Emergency Department Provider Note   I have reviewed the triage vital signs and the nursing notes.   HISTORY  Chief Complaint Abdominal Pain   HPI Crystal Gibbs is a 45 y.o. female with past medical history reviewed below including diabetes presents to the emergency department with abdominal pain, headache, subjective fever.  Symptoms been ongoing for the past several weeks and progressively gotten worse.  Patient states that her abdominal pain is epigastric and right upper quadrant.  She states initially it was worse with eating fatty foods but now has pain whenever she eats anything.  She denies lower abdominal symptoms.  No dysuria, hesitancy, urgency.  No back or flank pain.  No chest pain or shortness of breath symptoms.  No sick contacts.  No radiation of symptoms or other modifying factors.  Video Arabic interpreter used for evaluation.   Past Medical History:  Diagnosis Date  . Cancer ([place])   . Diabetes mellitus without complication ([place])   . Fibroid   . Medical history non-contributory     Patient Active Problem List   Diagnosis Date Noted  . Hypercalcemia 07/02/2020  . 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 History of cataract 07/02/2020  . 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 Health care maintenance 09/03/2015  . Breast cancer screening, high risk patient 09/03/2015  . 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 Primary hyperparathyroidism ([place]) 02/09/2015  . 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 AMA (advanced maternal age) primigravida 35+ 09/30/2014    Past Surgical History:  Procedure Laterality Date  . CATARACT EXTRACTION    . PARATHYROIDECTOMY Right 02/19/15    Allergies Patient has no known allergies.  Family History  Problem Relation Age of Onset  . Cancer Mother   . Cancer Father   . Diabetes Father   . Cancer Sister   . Diabetes Sister     Social History Social History   Tobacco Use  . Smoking status: Never Smoker  . Smokeless  tobacco: Never Used  Substance Use Topics  . Alcohol use: No  . Drug use: No    Review of Systems  Constitutional: No fever/chills Eyes: No visual changes. ENT: No sore throat. Cardiovascular: Denies chest pain. Respiratory: Denies shortness of breath. Gastrointestinal: Positive epigastric and RUQ abdominal pain. Positive nausea and vomiting.  No diarrhea.  No constipation. Genitourinary: Negative for dysuria. Musculoskeletal: Negative for back pain. Skin: Negative for rash. Neurological: Negative for headaches, focal weakness or numbness.  10-point ROS otherwise negative.  ____________________________________________   PHYSICAL EXAM:  VITAL SIGNS: ED Triage Vitals  Enc Vitals Group     BP 07/09/20 1910 (!) 138/96     Pulse Rate 07/09/20 1910 86     Resp 07/09/20 1910 20     Temp 07/09/20 1910 99.8 F (37.7 C)     Temp Source 07/09/20 1910 Oral     SpO2 07/09/20 1910 99 %   Constitutional: Alert and oriented. Well appearing and in no acute distress. Eyes: Conjunctivae are normal. Head: Atraumatic. Nose: No congestion/rhinnorhea. Mouth/Throat: Mucous membranes are moist.  Neck: No stridor.  Cardiovascular: Normal rate, regular rhythm. Good peripheral circulation. Grossly normal heart sounds.   Respiratory: Normal respiratory effort.  No retractions. Lungs CTAB. Gastrointestinal: Soft with mid epigastric and RUQ tenderness . No distention.  Musculoskeletal: No lower extremity tenderness nor edema. No gross deformities of extremities. Neurologic:  Normal speech and language. No gross focal neurologic deficits are appreciated.  Skin:  Skin is warm, dry and intact. No rash  noted.  ____________________________________________   LABS (all labs ordered are listed, but only abnormal results are displayed)  Labs Reviewed  COMPREHENSIVE METABOLIC PANEL - Abnormal; Notable for the following components:      Result Value   Sodium 134 (*)    Glucose, Bld 101 (*)     Albumin 3.4 (*)    AST 186 (*)    ALT 145 (*)    Alkaline Phosphatase 243 (*)    All other components within normal limits  URINALYSIS, ROUTINE W REFLEX MICROSCOPIC - Abnormal; Notable for the following components:   Hgb urine dipstick LARGE (*)    Bacteria, UA RARE (*)    All other components within normal limits  LIPASE, BLOOD  CBC WITH DIFFERENTIAL/PLATELET  I-STAT BETA HCG BLOOD, ED ([facility], [facility], [facility] ONLY)   ____________________________________________  EKG   EKG Interpretation  Date/Time:  Friday July 09 2020 19:18:08 EDT Ventricular Rate:  83 PR Interval:  148 QRS Duration: 72 QT Interval:  378 QTC Calculation: 444 R Axis:   -5 Text Interpretation: Normal sinus rhythm Possible Left atrial enlargement Borderline ECG Confirmed by Nanda Quinton [id number]) on 07/10/2020 10:23:25 AM       ____________________________________________  RADIOLOGY  CT ABDOMEN PELVIS W CONTRAST  Result Date: 07/10/2020 CLINICAL DATA:  Right upper quadrant pain for 3 weeks. EXAM: CT ABDOMEN AND PELVIS WITH CONTRAST TECHNIQUE: Multidetector CT imaging of the abdomen and pelvis was performed using the standard protocol following bolus administration of intravenous contrast. CONTRAST:  134m OMNIPAQUE IOHEXOL 300 MG/ML  SOLN COMPARISON:  Abdominal ultrasound July 10, 2020 FINDINGS: Lower chest: No acute abnormality. Hepatobiliary: There is a large masslike region in the central liver involving both the right and left hepatic lobes measuring 10.9 by 7.1 by 10.2 cm in transverse, AP, and craniocaudal dimensions. The mass is heterogeneous but primarily low in attenuation. There are at least 2 small satellite lesions as seen on series 4, image 11 in the dome measuring 11 mm and series 4, image 15 posterior to the main or larger mass/collection measuring up to 1.7 cm. The mass/dominant collection exerts mass effect on the intrahepatic portions of the portal vein without occlusion or thrombosis identified. Low-attenuation  in the liver diffusely is likely due to mild hepatic steatosis. The gallbladder is normal in appearance. Pancreas: Unremarkable. No pancreatic ductal dilatation or surrounding inflammatory changes. Spleen: Normal in size without focal abnormality. Adrenals/Urinary Tract: Adrenal glands are normal. There is a cyst in the midpole of the left kidney. No suspicious renal masses. No hydronephrosis or perinephric stranding. The ureters and bladder are unremarkable. Stomach/Bowel: The distal esophagus, stomach, and small bowel are normal. The colon and appendix are normal. Vascular/Lymphatic: Calcified atherosclerosis is seen in the distal abdominal aorta, mild. No adenopathy identified. Reproductive: A fibroid uterus is identified. The adnexa/ovaries are normal. Other: No free air or free fluid. Musculoskeletal: No acute or significant osseous findings. IMPRESSION: 1. There is a large masslike collection or mass in the central liver measuring at least 10.9 x 7.1 x 10.2 cm with 2 smaller adjacent satellite lesions/collections measuring 11 and 17 mm. These findings may represent a large abscess with satellite abscesses or a large neoplasm/malignancy. Recommend MRI for further evaluation. 2. Left renal cyst. 3. Calcified atherosclerosis in the distal abdominal aorta is mild. 4. Fibroid uterus. Electronically Signed   By: DDorise BullionIII M.D   On: 07/10/2020 12:57   UKoreaAbdomen Limited RUQ (LIVER/GB)  Result Date: 07/10/2020 CLINICAL DATA:  Right upper quadrant  pain 3 weeks. EXAM: ULTRASOUND ABDOMEN LIMITED RIGHT UPPER QUADRANT COMPARISON:  None. FINDINGS: Gallbladder: No gallstones or wall thickening visualized. No sonographic Murphy sign noted by sonographer. Common bile duct: Diameter: 2.6 mm. Liver: Heterogeneous parenchymal echogenicity with 3 masslike areas measuring 4.3 cm, 5.4 cm and 6.4 cm respectively. Portal vein is patent on color Doppler imaging with normal direction of blood flow towards the liver. Other:  Normal visualized pancreas. IMPRESSION: 1.  No acute hepatobiliary findings. 2. Heterogeneous liver echotexture with 3 masslike areas as described measuring 4.3 cm, 5.4 cm and 6.4 cm respectively. Recommend CT abdomen/pelvis with intravenous contrast for further evaluation. Electronically Signed   By: Marin Olp M.D.   On: 07/10/2020 10:29    ____________________________________________   PROCEDURES  Procedure(s) performed:   Procedures  None  ____________________________________________   INITIAL IMPRESSION / ASSESSMENT AND PLAN / ED COURSE  Pertinent labs & imaging results that were available during my care of the patient were reviewed by me and considered in my medical decision making (see chart for details).   Patient presents emergency department with epigastric and right upper quadrant abdominal pain with vomiting worse with eating.  She is afebrile here but describing some subjective fevers at home along with headache.  She is very well-appearing.  Vital signs are within normal limits.  Labs show elevated LFTs along with elevated alk phos.  Bilirubin is normal.  No leukocytosis.  Plan for right upper quadrant ultrasound to evaluate for biliary obstruction versus an acute cholecystitis.   Ultrasound and CT imaging reviewed and discussed with with [facility], Alonza Bogus.  With normal common bile duct diameter and normal bilirubin they do not see an indication for MRCP.  Plan for MRI abdomen liver protocol which was ordered.  Patient is not having infection symptoms but this seems more consistent with abscess or other fluid collection patient may require admission for antibiotics and IR drainage.  If this, on MRI, seems more consistent with mass or malignancy the patient can follow with either the PCP or lower GI for scheduling of a biopsy and outpatient mgmt moving forward. Discussed results and plan with patient using Arabic interpreter.   MRI pending. Case transferred to Dr. Almyra Free.   ____________________________________________  FINAL CLINICAL IMPRESSION(S) / ED DIAGNOSES  Final diagnoses:  RUQ abdominal pain     MEDICATIONS GIVEN DURING THIS VISIT:  Medications  iohexol (OMNIPAQUE) 300 MG/ML solution 100 mL (100 mLs Intravenous Contrast Given 07/10/20 1214)    Note:  This document was prepared using Dragon voice recognition software and may include unintentional dictation errors.  Nanda Quinton, MD, [facility]    Jahquan Klugh, Wonda Olds, MD 07/11/20 [phone]

## 2020-07-10 NOTE — ED Provider Notes (Signed)
MRI imaging shows a liver mass concerning for possible cancer.  Case discussed with GI who will try to arrange an expedited outpatient appointment with her.  Patient otherwise discharged home in stable condition.   Luna Fuse, MD 07/10/20 [phone]

## 2020-07-12 ENCOUNTER — Telehealth: Payer: Self-pay

## 2020-07-12 DIAGNOSIS — R932 Abnormal findings on diagnostic imaging of liver and biliary tract: Secondary | ICD-10-CM

## 2020-07-12 DIAGNOSIS — R160 Hepatomegaly, not elsewhere classified: Secondary | ICD-10-CM

## 2020-07-12 NOTE — Telephone Encounter (Signed)
-----   Message from Lavena Bullion, DO sent at 07/10/2020  6:08 PM EDT ----- 45 yo female with a large liver mass noted on CT and MRI. Jess, I think they may have called you earlier, but she will be discharged home from ER instead of admission. She will need the following:  - Expedited appt with IR for biopsy of the mass - Expedited Gi follow-up

## 2020-07-12 NOTE — Telephone Encounter (Signed)
Left message for [facility] (scheduling) to return call so that patient can be scheduled for a liver biopsy.   Patient is already scheduled with Carl Best, NP on Thursday, 07/29/20 at 9:30 AM.

## 2020-07-13 ENCOUNTER — Telehealth (HOSPITAL_COMMUNITY): Payer: Self-pay

## 2020-07-13 NOTE — Telephone Encounter (Signed)
Spoke with Lennox Solders to schedule US liver biopsy. She states that information has been sent and is under review by the radiologist and she will contact patient to schedule once review is complete.   Left message on mobile vm for patient via interpreter services. Interpreter ID: [id number]

## 2020-07-15 ENCOUNTER — Encounter (HOSPITAL_COMMUNITY): Payer: Self-pay

## 2020-07-15 NOTE — Progress Notes (Unsigned)
  [facility] Female, 45 y.o., 25-Jan-1976  MRN:  [id number] Phone:  [phone] (H) ... Needs Interpreter: Crystal Gibbs      PCP:  Crystal Alpha, Crystal Gibbs Coverage:  [place] With [facility]iology ([facility]) 07/26/2020 at 1:00 PM           RE: Biopsy Received: 2 days ago  Message Details  Crystal Mckusick, Crystal Gibbs  Crystal Gibbs E OK for US guided liver mass biopsy.   Crystal Gibbs    Previous Messages  ----- Message -----  From: Crystal Gibbs  Sent: 07/13/2020 11:28 AM EDT  To: Ir Procedure Requests  Subject: Biopsy                      Procedure Requested: US Biopsy (Liver)    Reason for Procedure: large liver mass    Provider Requesting: Crystal Gibbs  Provider Telephone:  [phone]    Other Info:

## 2020-07-16 NOTE — Telephone Encounter (Signed)
Patient is scheduled for an appt with IR on 07/26/20 for liver biopsy.

## 2020-07-22 ENCOUNTER — Other Ambulatory Visit: Payer: Self-pay | Admitting: Radiology

## 2020-07-23 ENCOUNTER — Other Ambulatory Visit: Payer: Self-pay | Admitting: Radiology

## 2020-07-26 ENCOUNTER — Ambulatory Visit (HOSPITAL_COMMUNITY)
Admission: RE | Admit: 2020-07-26 | Discharge: 2020-07-26 | Disposition: A | Discharge status: Home / Self Care | Payer: 59 | Source: Ambulatory Visit | Attending: Interventional Radiology | Admitting: Interventional Radiology

## 2020-07-26 ENCOUNTER — Ambulatory Visit (HOSPITAL_COMMUNITY)
Admission: RE | Admit: 2020-07-26 | Discharge: 2020-07-26 | Disposition: A | Discharge status: Home / Self Care | Payer: 59 | Source: Ambulatory Visit | Attending: Gastroenterology | Admitting: Gastroenterology

## 2020-07-26 ENCOUNTER — Other Ambulatory Visit: Payer: Self-pay

## 2020-07-26 ENCOUNTER — Encounter (HOSPITAL_COMMUNITY): Payer: Self-pay

## 2020-07-26 DIAGNOSIS — R932 Abnormal findings on diagnostic imaging of liver and biliary tract: Secondary | ICD-10-CM

## 2020-07-26 DIAGNOSIS — C227 Other specified carcinomas of liver: Secondary | ICD-10-CM | POA: Insufficient documentation

## 2020-07-26 DIAGNOSIS — R160 Hepatomegaly, not elsewhere classified: Secondary | ICD-10-CM | POA: Diagnosis present

## 2020-07-26 DIAGNOSIS — I700 Atherosclerosis of aorta: Secondary | ICD-10-CM | POA: Insufficient documentation

## 2020-07-26 LAB — CBC
HCT: 42.2 % (ref 36.0–46.0)
Hemoglobin: 13.8 g/dL (ref 12.0–15.0)
MCH: 28.5 pg (ref 26.0–34.0)
MCHC: 32.7 g/dL (ref 30.0–36.0)
MCV: 87.2 fL (ref 80.0–100.0)
Platelets: 218 10*3/uL (ref 150–400)
RBC: 4.84 MIL/uL (ref 3.87–5.11)
RDW: 13.2 % (ref 11.5–15.5)
WBC: 7.9 10*3/uL (ref 4.0–10.5)
nRBC: 0 % (ref 0.0–0.2)

## 2020-07-26 LAB — PROTIME-INR
INR: 0.9 (ref 0.8–1.2)
Prothrombin Time: 12.2 seconds (ref 11.4–15.2)

## 2020-07-26 MED ORDER — FENTANYL CITRATE (PF) 100 MCG/2ML IJ SOLN
INTRAMUSCULAR | Status: AC
  Filled 2020-07-26: qty 2

## 2020-07-26 MED ORDER — LIDOCAINE HCL 1 % IJ SOLN
INTRAMUSCULAR | Status: AC | PRN
  Administered 2020-07-26: 5 mL

## 2020-07-26 MED ORDER — HYDROMORPHONE HCL 1 MG/ML IJ SOLN
INTRAMUSCULAR | Status: AC
  Filled 2020-07-26: qty 1

## 2020-07-26 MED ORDER — MIDAZOLAM HCL 2 MG/2ML IJ SOLN
INTRAMUSCULAR | Status: AC | PRN
  Administered 2020-07-26 (×2): 1 mg via INTRAVENOUS

## 2020-07-26 MED ORDER — HYDROMORPHONE HCL 1 MG/ML IJ SOLN
1.0000 mg | Freq: Once | INTRAMUSCULAR | Status: AC
Start: 2020-07-26 — End: 2020-07-26
  Administered 2020-07-26: 1 mg via INTRAVENOUS

## 2020-07-26 MED ORDER — MIDAZOLAM HCL 2 MG/2ML IJ SOLN
INTRAMUSCULAR | Status: AC
  Filled 2020-07-26: qty 2

## 2020-07-26 MED ORDER — ONDANSETRON HCL 4 MG/2ML IJ SOLN
INTRAMUSCULAR | Status: AC
  Filled 2020-07-26: qty 2

## 2020-07-26 MED ORDER — GELATIN ABSORBABLE 12-7 MM EX MISC
CUTANEOUS | Status: AC
  Filled 2020-07-26: qty 1

## 2020-07-26 MED ORDER — FENTANYL CITRATE (PF) 100 MCG/2ML IJ SOLN
INTRAMUSCULAR | Status: AC | PRN
  Administered 2020-07-26 (×2): 50 ug via INTRAVENOUS

## 2020-07-26 MED ORDER — SODIUM CHLORIDE 0.9 % IV SOLN: INTRAVENOUS | Status: DC

## 2020-07-26 MED ORDER — LIDOCAINE HCL (PF) 1 % IJ SOLN
INTRAMUSCULAR | Status: AC
  Filled 2020-07-26: qty 30

## 2020-07-26 MED ORDER — GELATIN ABSORBABLE 12-7 MM EX MISC
CUTANEOUS | Status: AC | PRN
  Administered 2020-07-26: 1

## 2020-07-26 MED ORDER — HYDROCODONE-ACETAMINOPHEN 5-325 MG PO TABS: 1.0000 | ORAL_TABLET | ORAL | Status: DC | PRN

## 2020-07-26 MED ORDER — ONDANSETRON HCL 4 MG/2ML IJ SOLN
4.0000 mg | Freq: Once | INTRAMUSCULAR | Status: AC
  Administered 2020-07-26: 4 mg via INTRAVENOUS
  Filled 2020-07-26: qty 2

## 2020-07-26 NOTE — Procedures (Signed)
Interventional Radiology Procedure Note  Procedure: Ultrasound guided liver mass biopsy.  Findings: Please refer to procedural dictation for full description.  Large left hepatic mass.  18 ga core x3, samples placed in formalin.  Gelfoam needle track embolization.    Complications: None immediate  Estimated Blood Loss: < 5 mL  Recommendations: Strict bedrest for 3 hours. Follow up Pathology results.   Ruthann Cancer, MD Pager: [phone]

## 2020-07-26 NOTE — Progress Notes (Signed)
Client c/o 8/10 abd pain; Dr Vernard Gambles notified and in to see client and order noted and pain med given

## 2020-07-26 NOTE — Progress Notes (Signed)
Client states pain relieved and nausea better,Dr Vernard Gambles notified and ok to d/c home per Dr Vernard Gambles

## 2020-07-26 NOTE — H&P (Signed)
Chief Complaint: Patient was seen in consultation today for liver lesion biopsy at the request of [place]  Referring Physician(s): Cirigliano,Vito V  Supervising Physician: Ruthann Cancer  Patient Status: [facility] - Out-pt  History of Present Illness: Crystal Gibbs is a 45 y.o. female   Pt with abd pain and Nausea for weeks Wt loss; fever Pain worse with eating Was seen in ED 07/10/20:   CT:  IMPRESSION: 1. There is a large masslike collection or mass in the central liver measuring at least 10.9 x 7.1 x 10.2 cm with 2 smaller adjacent satellite lesions/collections measuring 11 and 17 mm. These findings may represent a large abscess with satellite abscesses or a large neoplasm/malignancy. Recommend MRI for further evaluation. 2. Left renal cyst. 3. Calcified atherosclerosis in the distal abdominal aorta is mild. 4. Fibroid uterus.  MR:  IMPRESSION: 1. The large central hepatic mass does not demonstrate signal characteristics or enhancement typical of a hemangioma or abscess, and is suspicious for malignancy. There is at least 1 satellite lesion centrally in the right lobe. As there are no signs of underlying cirrhosis, primary considerations include peripheral cholangiocarcinoma and metastatic disease. Tissue sampling recommended. 2. Mild intrahepatic biliary dilatation within the left hepatic lobe. Distortion of the hepatic vasculature without evidence of tumor thrombus. 3. No extrahepatic metastatic disease or primary malignancy identified within the abdomen.  Pt has appt to see Carl Best 07/29/20 Dr Gerrit Heck (GI MD)-- has ordered liver mass biopsy Crystal Gibbs will hear from office after procedure per chart notes     Past Medical History:  Diagnosis Date  . Cancer ([place])   . Diabetes mellitus without complication ([place])   . Fibroid   . Medical history non-contributory     Past Surgical History:  Procedure Laterality Date  . CATARACT EXTRACTION     . PARATHYROIDECTOMY Right 02/19/15    Allergies: Patient has no known allergies.  Medications: Prior to Admission medications   Medication Sig Start Date End Date Taking? Authorizing Provider  FOLIC ACID PO Take 1 tablet by mouth daily.    [provider]  Multiple Vitamin (MULTIVITAMIN PO) Take 1 tablet by mouth daily.    [provider]  NIFEdipine (PROCARDIA) 10 MG capsule Take 10 mg by mouth 3 (three) times daily.    [provider]  omeprazole (PRILOSEC) 20 MG capsule Take 1 capsule (20 mg total) by mouth daily. Patient not taking: No sig reported 09/02/15   McKeag, Marylynn Pearson, MD  prenatal vitamin w/FE, FA (PRENATAL 1 + 1) 27-1 MG TABS tablet Take 1 tablet by mouth daily. Patient not taking: No sig reported 09/30/14   Poe, Deirdre C, CNM  traMADol (ULTRAM) 50 MG tablet Take 1 tablet (50 mg total) by mouth every 6 (six) hours as needed for up to 15 doses. 07/10/20   Luna Fuse, MD     Family History  Problem Relation Age of Onset  . Cancer Mother   . Cancer Father   . Diabetes Father   . Cancer Sister   . Diabetes Sister     Social History   Socioeconomic History  . Marital status: Married    Spouse name: Not on file  . Number of children: Not on file  . Years of education: Not on file  . Highest education level: Not on file  Occupational History  . Not on file  Tobacco Use  . Smoking status: Never Smoker  . Smokeless tobacco: Never Used  Substance and  Sexual Activity  . Alcohol use: No  . Drug use: No  . Sexual activity: Yes  Other Topics Concern  . Not on file  Social History Narrative  . Not on file   Social Determinants of Health   Financial Resource Strain: Not on file  Food Insecurity: Not on file  Transportation Needs: Not on file  Physical Activity: Not on file  Stress: Not on file  Social Connections: Not on file     Review of Systems: A 12 point ROS discussed and pertinent positives are indicated in the HPI above.   All other systems are negative.  Review of Systems  Constitutional: Positive for activity change, appetite change and unexpected weight change. Negative for fatigue.  Respiratory: Negative for cough and shortness of breath.   Cardiovascular: Negative for chest pain.  Gastrointestinal: Positive for abdominal pain. Negative for nausea.  Musculoskeletal: Negative for back pain.  Neurological: Negative for weakness.  Psychiatric/Behavioral: Negative for behavioral problems and confusion.    Vital Signs: BP (!) 146/83   Pulse 80   Temp 98.7 F (37.1 C) (Oral)   Resp 18   Ht 5\' 4"  (1.626 m)   Wt 154 lb 5.2 oz (70 kg)   SpO2 100%   BMI 26.49 kg/m   Physical Exam Vitals reviewed.  HENT:     Mouth/Throat:     Mouth: Mucous membranes are moist.  Cardiovascular:     Rate and Rhythm: Normal rate.     Heart sounds: Normal heart sounds.  Pulmonary:     Effort: Pulmonary effort is normal.     Breath sounds: Normal breath sounds.  Abdominal:     Palpations: Abdomen is soft. There is no mass.     Tenderness: There is abdominal tenderness.  Musculoskeletal:        General: Normal range of motion.  Skin:    General: Skin is warm.  Neurological:     Mental Status: Crystal Gibbs is alert and oriented to person, place, and time.  Psychiatric:        Behavior: Behavior normal.     Imaging: MR Abdomen W or Wo Contrast  Result Date: 07/10/2020 CLINICAL DATA:  Liver mass on CT and ultrasound. Hepatic biliary cancer, staging. EXAM: MRI ABDOMEN WITHOUT AND WITH CONTRAST TECHNIQUE: Multiplanar multisequence MR imaging of the abdomen was performed both before and after the administration of intravenous contrast. CONTRAST:  44mL GADAVIST GADOBUTROL 1 MMOL/ML IV SOLN COMPARISON:  CT and ultrasound studies earlier the same date. FINDINGS: Despite efforts by the technologist and patient, mild motion artifact is present on today's exam and could not be eliminated. This reduces exam sensitivity and specificity.  Lower chest:  The visualized lower chest appears unremarkable. Hepatobiliary: There are no morphologic changes of cirrhosis. As demonstrated on the earlier examinations, there is a very large central hepatic mass centered on segment 4, but involving the anterior segment of the right hepatic lobe (segments 5 and 8). This mass measures up to 12.1 x 8.0 cm on image 92/10. It demonstrates heterogeneous mildly increased T2 signal, low T1 signal and restricted diffusion. There is heterogeneous enhancement following contrast. There is at least 1 satellite nodule lateral to the IVC and anterior to the right hepatic vein, measuring 2.5 cm on image 89/10. Mild intrahepatic biliary dilatation is present peripherally in the left hepatic lobe. The gallbladder appears normal. There is no extrahepatic biliary dilatation. The central liver mass exerts mass effect on the hepatic and portal veins, but no tumor thrombus  identified. Pancreas: Unremarkable. No pancreatic ductal dilatation or surrounding inflammatory changes. Spleen: Normal in size without focal abnormality. Adrenals/Urinary Tract: Both adrenal glands appear normal. Simple left renal cyst. No evidence of renal mass or hydronephrosis. Stomach/Bowel: No bowel lesions identified within the abdomen. There is no bowel distension or surrounding inflammatory change. Vascular/Lymphatic: There are no enlarged abdominal lymph nodes. No acute vascular findings. As above, the central hepatic mass distorts the portal and hepatic veins, but there is no evidence of tumor thrombus. Other: No ascites or peritoneal nodularity. Musculoskeletal: No acute or significant osseous findings. IMPRESSION: 1. The large central hepatic mass does not demonstrate signal characteristics or enhancement typical of a hemangioma or abscess, and is suspicious for malignancy. There is at least 1 satellite lesion centrally in the right lobe. As there are no signs of underlying cirrhosis, primary  considerations include peripheral cholangiocarcinoma and metastatic disease. Tissue sampling recommended. 2. Mild intrahepatic biliary dilatation within the left hepatic lobe. Distortion of the hepatic vasculature without evidence of tumor thrombus. 3. No extrahepatic metastatic disease or primary malignancy identified within the abdomen. Electronically Signed   By: Richardean Sale M.D.   On: 07/10/2020 16:10   CT ABDOMEN PELVIS W CONTRAST  Result Date: 07/10/2020 CLINICAL DATA:  Right upper quadrant pain for 3 weeks. EXAM: CT ABDOMEN AND PELVIS WITH CONTRAST TECHNIQUE: Multidetector CT imaging of the abdomen and pelvis was performed using the standard protocol following bolus administration of intravenous contrast. CONTRAST:  161mL OMNIPAQUE IOHEXOL 300 MG/ML  SOLN COMPARISON:  Abdominal ultrasound July 10, 2020 FINDINGS: Lower chest: No acute abnormality. Hepatobiliary: There is a large masslike region in the central liver involving both the right and left hepatic lobes measuring 10.9 by 7.1 by 10.2 cm in transverse, AP, and craniocaudal dimensions. The mass is heterogeneous but primarily low in attenuation. There are at least 2 small satellite lesions as seen on series 4, image 11 in the dome measuring 11 mm and series 4, image 15 posterior to the main or larger mass/collection measuring up to 1.7 cm. The mass/dominant collection exerts mass effect on the intrahepatic portions of the portal vein without occlusion or thrombosis identified. Low-attenuation in the liver diffusely is likely due to mild hepatic steatosis. The gallbladder is normal in appearance. Pancreas: Unremarkable. No pancreatic ductal dilatation or surrounding inflammatory changes. Spleen: Normal in size without focal abnormality. Adrenals/Urinary Tract: Adrenal glands are normal. There is a cyst in the midpole of the left kidney. No suspicious renal masses. No hydronephrosis or perinephric stranding. The ureters and bladder are unremarkable.  Stomach/Bowel: The distal esophagus, stomach, and small bowel are normal. The colon and appendix are normal. Vascular/Lymphatic: Calcified atherosclerosis is seen in the distal abdominal aorta, mild. No adenopathy identified. Reproductive: A fibroid uterus is identified. The adnexa/ovaries are normal. Other: No free air or free fluid. Musculoskeletal: No acute or significant osseous findings. IMPRESSION: 1. There is a large masslike collection or mass in the central liver measuring at least 10.9 x 7.1 x 10.2 cm with 2 smaller adjacent satellite lesions/collections measuring 11 and 17 mm. These findings may represent a large abscess with satellite abscesses or a large neoplasm/malignancy. Recommend MRI for further evaluation. 2. Left renal cyst. 3. Calcified atherosclerosis in the distal abdominal aorta is mild. 4. Fibroid uterus. Electronically Signed   By: Dorise Bullion III M.D   On: 07/10/2020 12:57   US Abdomen Limited RUQ (LIVER/GB)  Result Date: 07/10/2020 CLINICAL DATA:  Right upper quadrant pain 3 weeks. EXAM: ULTRASOUND ABDOMEN  LIMITED RIGHT UPPER QUADRANT COMPARISON:  None. FINDINGS: Gallbladder: No gallstones or wall thickening visualized. No sonographic Murphy sign noted by sonographer. Common bile duct: Diameter: 2.6 mm. Liver: Heterogeneous parenchymal echogenicity with 3 masslike areas measuring 4.3 cm, 5.4 cm and 6.4 cm respectively. Portal vein is patent on color Doppler imaging with normal direction of blood flow towards the liver. Other: Normal visualized pancreas. IMPRESSION: 1.  No acute hepatobiliary findings. 2. Heterogeneous liver echotexture with 3 masslike areas as described measuring 4.3 cm, 5.4 cm and 6.4 cm respectively. Recommend CT abdomen/pelvis with intravenous contrast for further evaluation. Electronically Signed   By: Marin Olp M.D.   On: 07/10/2020 10:29    Labs:  CBC: Recent Labs    07/09/20 1932  WBC 8.6  HGB 13.4  HCT 41.5  PLT 222    COAGS: No results  for input(s): INR, APTT in the last 8760 hours.  BMP: Recent Labs    07/09/20 1932  NA 134*  K 4.6  CL 99  CO2 30  GLUCOSE 101*  BUN 10  CALCIUM 9.0  CREATININE 0.53  GFRNONAA >60    LIVER FUNCTION TESTS: Recent Labs    07/09/20 1932  BILITOT 0.8  AST 186*  ALT 145*  ALKPHOS 243*  PROT 7.2  ALBUMIN 3.4*    TUMOR MARKERS: No results for input(s): AFPTM, CEA, CA199, CHROMGRNA in the last 8760 hours.  Assessment and Plan:  New liver lesion Scheduled for biopsy today Pt will hear from GI office for appt time and date post procedure Risks and benefits of liver lesion biopsy was discussed with the patient and/or patient's family including, but not limited to bleeding, infection, damage to adjacent structures or low yield requiring additional tests.  All of the questions were answered and there is agreement to proceed.  Consent signed and in chart.   Thank you for this interesting consult.  I greatly enjoyed meeting [facility] and look forward to participating in their care.  A copy of this report was sent to the requesting provider on this date.  Electronically Signed: Lavonia Drafts, PA-C 07/26/2020, 12:07 PM   I spent a total of  30 Minutes   in face to face in clinical consultation, greater than 50% of which was counseling/coordinating care for liver lesion bx

## 2020-07-26 NOTE — Discharge Instructions (Signed)
Liver Biopsy, Care After These instructions give you information on caring for yourself after your procedure. Your doctor may also give you more specific instructions. Call your doctor if you have any problems or questions after your procedure. What can I expect after the procedure? After the procedure, it is common to have:  Pain and soreness where the biopsy was done.  Bruising around the area where the biopsy was done.  Sleepiness and be tired for a few days. Follow these instructions at home: Medicines  Take over-the-counter and prescription medicines only as told by your doctor.  If you were prescribed an antibiotic medicine, take it as told by your doctor. Do not stop taking the antibiotic even if you start to feel better.  Do not take medicines such as aspirin and ibuprofen. These medicines can thin your blood. Do not take these medicines unless your doctor tells you to take them.  If you are taking prescription pain medicine, take actions to prevent or treat constipation. Your doctor may recommend that you: ? Drink enough fluid to keep your pee (urine) clear or pale yellow. ? Take over-the-counter or prescription medicines. ? Eat foods that are high in fiber, such as fresh fruits and vegetables, whole grains, and beans. ? Limit foods that are high in fat and processed sugars, such as fried and sweet foods. Caring for your cut  Follow instructions from your doctor about how to take care of your cuts from surgery (incisions). Make sure you: ? Wash your hands with soap and water before you change your bandage (dressing). If you cannot use soap and water, use hand sanitizer. ? Change your bandage as told by your doctor. ? Leave stitches (sutures), skin glue, or skin tape (adhesive) strips in place. They may need to stay in place for 2 weeks or longer. If tape strips get loose and curl up, you may trim the loose edges. Do not remove tape strips completely unless your doctor says it is  okay.  Check your cuts every day for signs of infection. Check for: ? Redness, swelling, or more pain. ? Fluid or blood. ? Pus or a bad smell. ? Warmth.  Do not take baths, swim, or use a hot tub until your doctor says it is okay to do so. Activity  Rest at home for 1-2 days or as told by your doctor. ? Avoid sitting for a long time without moving. Get up to take short walks every 1-2 hours.  Return to your normal activities as told by your doctor. Ask what activities are safe for you.  Do not do these things in the first 24 hours: ? Drive. ? Use machinery. ? Take a bath or shower.  Do not lift more than 10 pounds (4.5 kg) or play contact sports for the first 2 weeks.   General instructions  Do not drink alcohol in the first week after the procedure.  Have someone stay with you for at least 24 hours after the procedure.  Get your test results. Ask your doctor or the department that is doing the test: ? When will my results be ready? ? How will I get my results? ? What are my treatment options? ? What other tests do I need? ? What are my next steps?  Keep all follow-up visits as told by your doctor. This is important.   Contact a doctor if:  A cut bleeds and leaves more than just a small spot of blood.  A cut is red,   puffs up (swells), or hurts more than before.  Fluid or something else comes from a cut.  A cut smells bad.  You have a fever or chills. Get help right away if:  You have swelling, bloating, or pain in your belly (abdomen).  You get dizzy or faint.  You have a rash.  You feel sick to your stomach (nauseous) or throw up (vomit).  You have trouble breathing, feel short of breath, or feel faint.  Your chest hurts.  You have problems talking or seeing.  You have trouble with your balance or moving your arms or legs. Summary  After the procedure, it is common to have pain, soreness, bruising, and tiredness.  Your doctor will tell you how to  take care of yourself at home. Change your bandage, take your medicines, and limit your activities as told by your doctor.  Call your doctor if you have symptoms of infection. Get help right away if your belly swells, your cut bleeds a lot, or you have trouble talking or breathing. This information is not intended to replace advice given to you by your health care provider. Make sure you discuss any questions you have with your health care provider. Document Revised: 04/05/2017 Document Reviewed: 04/06/2017 Elsevier Patient Education  2021 [employer]  

## 2020-07-26 NOTE — Progress Notes (Signed)
Client with nausea and vomiting of about 100cc clear liquid; Dr Vernard Gambles notified order noted and med given for nausea

## 2020-07-26 NOTE — Progress Notes (Signed)
States no pain and nausea lessened; Dr Vernard Gambles notified and ok to d/c home

## 2020-07-26 NOTE — Sedation Documentation (Signed)
Placed call to Short Stay at 1345 and 1400, unable to give report to RN at both times, per [employer].  Will transport patient to Radiology Nurse's [facility] to await RN availability to receive report for patient to transport there for recovery post biopsy.

## 2020-07-28 LAB — SURGICAL PATHOLOGY

## 2020-07-28 NOTE — H&P (View-Only) (Signed)
07/28/2020 Crystal Gibbs [id number] 04/17/1975   CHIEF COMPLAINT:  Abdominal pain, liver mass   HISTORY OF PRESENT ILLNESS:  Crystal Gibbs is a 45 year old female with a past medical history hypertension and uterine fibroids. Past parathyroidectomy. She presents to our office today for further evaluation for a liver mass seen on recent abdominal imaging. She is originally from [place] and she speaks Arabic therefore she is accompanied by a [place] arabic interpreter. She developed nausea and upper abdominal pain 2 months ago while in [place]. She resided in [place] for the past 15 months for IVF procedures x 3 which failed. She underwent abdominal imaging on month ago while in [place] which showed a liver mass. She elected to return to [facility] to undergo further evaluation for her liver mass.  She presented to [facility] ED on 07/09/2020 with worsening abdominal pain.  Labs in the ED showed a alk phos level of 243.  AST 186.  ALT 145.  Total bili 0.8.  Lipase 30.  WBC 8.6.  Hemoglobin 13.4.  Hematocrit 41.5.  Platelet 222. A RUQ sonogram identified 3 hepatic masses measuring 4.3 cm, 5.4 cm and 6.4cm. An abdominal/pelvic CT showed a large masslike collection or mass in the central liver measuring 10.9 x 7.1 cm x 10.2cm and adjacent lesions which measured 11 and 7m.  An abdominal MRI identified a large central hepatic mass suspicious for malignancy.  Mild intrahepatic biliary ductal dilatation within the left hepatic lobe with distortion of the hepatic vasculature without evidence of tumor thrombus.  No extrahepatic metastatic disease or primary malignancy identified within the abdomen.  The ED physician contacted our on call GI physician, Dr. CBryan Lemma who recommended a liver biopsy as an outpatient. 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   She complains of having intermittent nausea. No vomiting.  No dysphagia or heartburn.  She has upper abdominal pain which is  often triggered shortly after eating and when sitting for long periods of time.  She also has random episodes of abdominal pain which are brief.  No lower abdominal pain.  She is passing a normal formed brown bowel movement most days.  She is having regular menstrual bleeding.  No fever, sweats or chills.  Approximately 10lb weight loss past few months.  She complains of fatigue. No jaundice. Her father died from liver cancer, further details are unclear.  Mother died from pancreatic cancer.  S/P liver biopsy 07/26/2020 Technically successful ultrasound guided core needle biopsy of left lobe liver mass. A. LIVER MASS, LEFT, NEEDLE CORE BIOPSY:  - Adenocarcinoma.   Abdominal MRI W/WO contrast  07/10/2020: 1. The large central hepatic mass does not demonstrate signal characteristics or enhancement typical of a hemangioma or abscess, and is suspicious for malignancy. There is at least 1 satellite lesion centrally in the right lobe. As there are no signs of underlying cirrhosis, primary considerations include peripheral cholangiocarcinoma and metastatic disease. Tissue sampling recommended. 2. Mild intrahepatic biliary dilatation within the left hepatic lobe. Distortion of the hepatic vasculature without evidence of tumor thrombus. 3. No extrahepatic metastatic disease or primary malignancy identified within the abdomen.  CTAP with contrast 07/10/2020: 1. There is a large masslike collection or mass in the central liver measuring at least 10.9 x 7.1 x 10.2 cm with 2 smaller adjacent satellite lesions/collections measuring 11 and 17 mm. These findings may represent a large abscess with satellite abscesses or a large neoplasm/malignancy. Recommend MRI for further evaluation. 2. Left  renal cyst. 3. Calcified atherosclerosis in the distal abdominal aorta is mild. 4. Fibroid uterus.  RUQ ultrasound 07/10/2020: 1.  No acute hepatobiliary findings. 2. Heterogeneous liver echotexture with 3 masslike  areas as described measuring 4.3 cm, 5.4 cm and 6.4 cm respectively. Recommend CT abdomen/pelvis with intravenous contrast for further evaluation.   CBC Latest Ref Rng & Units 07/26/2020 07/09/2020 09/02/2015  WBC 4.0 - 10.5 K/uL 7.9 8.6 4.8  Hemoglobin 12.0 - 15.0 g/dL 13.8 13.4 13.1  Hematocrit 36.0 - 46.0 % 42.2 41.5 38.3  Platelets 150 - 400 K/uL 218 222 235    CMP Latest Ref Rng & Units 07/09/2020 09/02/2015 06/01/2014  Glucose 70 - 99 mg/dL 101(H) 82 106(H)  BUN 6 - 20 mg/dL _0 Creatinine 0.44 - 1.00 mg/dL 0.53 0.81 0.75  Sodium 135 - 145 mmol/L 134(L) 137 138  Potassium 3.5 - 5.1 mmol/L 4.6 3.8 4.0  Chloride 98 - 111 mmol/L 99 101 106  CO2 22 - 32 mmol/L _1 Calcium 8.9 - 10.3 mg/dL 9.0 9.1 10.6(H)  Total Protein 6.5 - 8.1 g/dL 7.2 7.2 7.5  Total Bilirubin 0.3 - 1.2 mg/dL 0.8 0.5 0.7  Alkaline Phos 38 - 126 U/L 243(H) 62 71  AST 15 - 41 U/L 186(H) 14 18  ALT 0 - 44 U/L 145(H) 10 18   Past Medical History:  Diagnosis Date  . Cancer ([place])   . Diabetes mellitus without complication ([place])   . Fibroid   . Medical history non-contributory    Past Surgical History:  Procedure Laterality Date  . CATARACT EXTRACTION    . PARATHYROIDECTOMY Right 02/19/15   Social History: She is originally from [place].  She is married.  No children.  Non-smoker.  No alcohol use.  No drug use.  Family History: Father deceased age 61 with diabetes and liver cancer. Mother died age 25 pancreatic cancer.  Sister deceased age 48 breast cancer.  No family history of esophageal, gastric or colon cancer.  No Known Allergies    Outpatient Encounter Medications as of 07/29/2020  Medication Sig  . FOLIC ACID PO Take 1 tablet by mouth daily.  . Multiple Vitamin (MULTIVITAMIN PO) Take 1 tablet by mouth daily.  Marland Kitchen NIFEdipine (PROCARDIA) 10 MG capsule Take 10 mg by mouth 3 (three) times daily.  Marland Kitchen omeprazole (PRILOSEC) 20 MG capsule Take 1 capsule (20 mg total) by mouth daily.  . prenatal vitamin  w/FE, FA (PRENATAL 1 + 1) 27-1 MG TABS tablet Take 1 tablet by mouth daily.  . traMADol (ULTRAM) 50 MG tablet Take 1 tablet (50 mg total) by mouth every 6 (six) hours as needed for up to 15 doses.   No facility-administered encounter medications on file as of 07/29/2020.   REVIEW OF SYSTEMS:  Gen: Denies fever, sweats or chills. No weight loss.  CV: Denies chest pain, palpitations or edema. Resp: Denies cough, shortness of breath of hemoptysis.  GI: See HPI.  GU : Denies urinary burning, blood in urine, increased urinary frequency or incontinence. MS: Denies joint pain, muscles aches or weakness. Derm: Denies rash, itchiness, skin lesions or unhealing ulcers. Psych: Denies depression, anxiety or memory loss.  Heme: Denies bruising, bleeding. Neuro:  Denies headaches, dizziness or paresthesias. Endo:  Denies diabetes at this time.    PHYSICAL EXAM: There were no vitals taken for this visit.  BP 110/70   Pulse 92   Ht _2  (1.626 m)   Wt 143 lb (64.9 kg)  BMI 24.55 kg/m   Wt Readings from Last 3 Encounters:  07/29/20 143 lb (64.9 kg)  07/26/20 154 lb 5.2 oz (70 kg)  05/30/14 171 lb (77.6 kg)   LMP 07/08/2019 with intermittent bleeding since then.    General: 45 year old female in no acute distress. Head: Normocephalic and atraumatic. Eyes:  Sclerae non-icteric, conjunctive pink. Ears: Normal auditory acuity. Mouth: Dentition intact. No ulcers or lesions.  Neck: Supple, no lymphadenopathy or thyromegaly.  Lungs: Clear bilaterally to auscultation without wheezes, crackles or rhonchi. Heart: Regular rate and rhythm. No murmur, rub or gallop appreciated.  Abdomen: Soft,  non distended. Mild RUQ, epigastric and LUQ tenderness without rebound or guarding. No masses. No hepatosplenomegaly. Normoactive bowel sounds x 4 quadrants.  Rectal: Deferred.  Musculoskeletal: Symmetrical with no gross deformities. Skin: Warm and dry. No rash or lesions on visible extremities. Extremities: No  edema. Neurological: Alert oriented x 4, no focal deficits.  Psychological:  Alert and cooperative. Normal mood and affect.  ASSESSMENT AND PLAN:  32. 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 Father with history of liver cancer.  -I reviewed liver biopsy report which confirmed adenocarcinoma with the patient. She is in agreement to proceed with oncology evaluation and treatment.  -Chest CT with IV contrast for cancer staging  -Urgent oncology referral for liver adenocarcinoma evaluation and treatment  -BMP, hepatic panel, Hep B surface antigen, Hep B surface antibody, Hep B core total and Hep C antibody -Zofran 2m ODT one tab Q 6 hrs PRN  -Await further recommendations from oncology   2. Upper abdominal pain secondary to # 1 -No plans for endoscopic evaluation at this time  -Continue Omeprazole 268mQD for now  3. Hypertension -Patient to re-establish care with prior PCP Dr. LoGarlan Fillers4. Abnormal uterine bleeding. History of uterine fibroids.  -Gyn referral      CC:  LoNuala AlphaDO

## 2020-07-28 NOTE — Progress Notes (Signed)
07/28/2020 Crystal Gibbs [id number] 04/17/1975   CHIEF COMPLAINT:  Abdominal pain, liver mass   HISTORY OF PRESENT ILLNESS:  Crystal Gibbs is a 45 year old female with a past medical history hypertension and uterine fibroids. Past parathyroidectomy. She presents to our office today for further evaluation for a liver mass seen on recent abdominal imaging. She is originally from [place] and she speaks Arabic therefore she is accompanied by a [place] arabic interpreter. She developed nausea and upper abdominal pain 2 months ago while in [place]. She resided in [place] for the past 15 months for IVF procedures x 3 which failed. She underwent abdominal imaging on month ago while in [place] which showed a liver mass. She elected to return to [facility] to undergo further evaluation for her liver mass.  She presented to [facility] ED on 07/09/2020 with worsening abdominal pain.  Labs in the ED showed a alk phos level of 243.  AST 186.  ALT 145.  Total bili 0.8.  Lipase 30.  WBC 8.6.  Hemoglobin 13.4.  Hematocrit 41.5.  Platelet 222. A RUQ sonogram identified 3 hepatic masses measuring 4.3 cm, 5.4 cm and 6.4cm. An abdominal/pelvic CT showed a large masslike collection or mass in the central liver measuring 10.9 x 7.1 cm x 10.2cm and adjacent lesions which measured 11 and 7m.  An abdominal MRI identified a large central hepatic mass suspicious for malignancy.  Mild intrahepatic biliary ductal dilatation within the left hepatic lobe with distortion of the hepatic vasculature without evidence of tumor thrombus.  No extrahepatic metastatic disease or primary malignancy identified within the abdomen.  The ED physician contacted our on call GI physician, Dr. CBryan Lemma who recommended a liver biopsy as an outpatient. 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   She complains of having intermittent nausea. No vomiting.  No dysphagia or heartburn.  She has upper abdominal pain which is  often triggered shortly after eating and when sitting for long periods of time.  She also has random episodes of abdominal pain which are brief.  No lower abdominal pain.  She is passing a normal formed brown bowel movement most days.  She is having regular menstrual bleeding.  No fever, sweats or chills.  Approximately 10lb weight loss past few months.  She complains of fatigue. No jaundice. Her father died from liver cancer, further details are unclear.  Mother died from pancreatic cancer.  S/P liver biopsy 07/26/2020 Technically successful ultrasound guided core needle biopsy of left lobe liver mass. A. LIVER MASS, LEFT, NEEDLE CORE BIOPSY:  - Adenocarcinoma.   Abdominal MRI W/WO contrast  07/10/2020: 1. The large central hepatic mass does not demonstrate signal characteristics or enhancement typical of a hemangioma or abscess, and is suspicious for malignancy. There is at least 1 satellite lesion centrally in the right lobe. As there are no signs of underlying cirrhosis, primary considerations include peripheral cholangiocarcinoma and metastatic disease. Tissue sampling recommended. 2. Mild intrahepatic biliary dilatation within the left hepatic lobe. Distortion of the hepatic vasculature without evidence of tumor thrombus. 3. No extrahepatic metastatic disease or primary malignancy identified within the abdomen.  CTAP with contrast 07/10/2020: 1. There is a large masslike collection or mass in the central liver measuring at least 10.9 x 7.1 x 10.2 cm with 2 smaller adjacent satellite lesions/collections measuring 11 and 17 mm. These findings may represent a large abscess with satellite abscesses or a large neoplasm/malignancy. Recommend MRI for further evaluation. 2. Left  renal cyst. 3. Calcified atherosclerosis in the distal abdominal aorta is mild. 4. Fibroid uterus.  RUQ ultrasound 07/10/2020: 1.  No acute hepatobiliary findings. 2. Heterogeneous liver echotexture with 3 masslike  areas as described measuring 4.3 cm, 5.4 cm and 6.4 cm respectively. Recommend CT abdomen/pelvis with intravenous contrast for further evaluation.   CBC Latest Ref Rng & Units 07/26/2020 07/09/2020 09/02/2015  WBC 4.0 - 10.5 K/uL 7.9 8.6 4.8  Hemoglobin 12.0 - 15.0 g/dL 13.8 13.4 13.1  Hematocrit 36.0 - 46.0 % 42.2 41.5 38.3  Platelets 150 - 400 K/uL 218 222 235    CMP Latest Ref Rng & Units 07/09/2020 09/02/2015 06/01/2014  Glucose 70 - 99 mg/dL 101(H) 82 106(H)  BUN 6 - 20 mg/dL _0 Creatinine 0.44 - 1.00 mg/dL 0.53 0.81 0.75  Sodium 135 - 145 mmol/L 134(L) 137 138  Potassium 3.5 - 5.1 mmol/L 4.6 3.8 4.0  Chloride 98 - 111 mmol/L 99 101 106  CO2 22 - 32 mmol/L _1 Calcium 8.9 - 10.3 mg/dL 9.0 9.1 10.6(H)  Total Protein 6.5 - 8.1 g/dL 7.2 7.2 7.5  Total Bilirubin 0.3 - 1.2 mg/dL 0.8 0.5 0.7  Alkaline Phos 38 - 126 U/L 243(H) 62 71  AST 15 - 41 U/L 186(H) 14 18  ALT 0 - 44 U/L 145(H) 10 18   Past Medical History:  Diagnosis Date  . Cancer ([place])   . Diabetes mellitus without complication ([place])   . Fibroid   . Medical history non-contributory    Past Surgical History:  Procedure Laterality Date  . CATARACT EXTRACTION    . PARATHYROIDECTOMY Right 02/19/15   Social History: She is originally from [place].  She is married.  No children.  Non-smoker.  No alcohol use.  No drug use.  Family History: Father deceased age 61 with diabetes and liver cancer. Mother died age 25 pancreatic cancer.  Sister deceased age 48 breast cancer.  No family history of esophageal, gastric or colon cancer.  No Known Allergies    Outpatient Encounter Medications as of 07/29/2020  Medication Sig  . FOLIC ACID PO Take 1 tablet by mouth daily.  . Multiple Vitamin (MULTIVITAMIN PO) Take 1 tablet by mouth daily.  Marland Kitchen NIFEdipine (PROCARDIA) 10 MG capsule Take 10 mg by mouth 3 (three) times daily.  Marland Kitchen omeprazole (PRILOSEC) 20 MG capsule Take 1 capsule (20 mg total) by mouth daily.  . prenatal vitamin  w/FE, FA (PRENATAL 1 + 1) 27-1 MG TABS tablet Take 1 tablet by mouth daily.  . traMADol (ULTRAM) 50 MG tablet Take 1 tablet (50 mg total) by mouth every 6 (six) hours as needed for up to 15 doses.   No facility-administered encounter medications on file as of 07/29/2020.   REVIEW OF SYSTEMS:  Gen: Denies fever, sweats or chills. No weight loss.  CV: Denies chest pain, palpitations or edema. Resp: Denies cough, shortness of breath of hemoptysis.  GI: See HPI.  GU : Denies urinary burning, blood in urine, increased urinary frequency or incontinence. MS: Denies joint pain, muscles aches or weakness. Derm: Denies rash, itchiness, skin lesions or unhealing ulcers. Psych: Denies depression, anxiety or memory loss.  Heme: Denies bruising, bleeding. Neuro:  Denies headaches, dizziness or paresthesias. Endo:  Denies diabetes at this time.    PHYSICAL EXAM: There were no vitals taken for this visit.  BP 110/70   Pulse 92   Ht _2  (1.626 m)   Wt 143 lb (64.9 kg)  BMI 24.55 kg/m   Wt Readings from Last 3 Encounters:  07/29/20 143 lb (64.9 kg)  07/26/20 154 lb 5.2 oz (70 kg)  05/30/14 171 lb (77.6 kg)   LMP 07/08/2019 with intermittent bleeding since then.    General: 45 year old female in no acute distress. Head: Normocephalic and atraumatic. Eyes:  Sclerae non-icteric, conjunctive pink. Ears: Normal auditory acuity. Mouth: Dentition intact. No ulcers or lesions.  Neck: Supple, no lymphadenopathy or thyromegaly.  Lungs: Clear bilaterally to auscultation without wheezes, crackles or rhonchi. Heart: Regular rate and rhythm. No murmur, rub or gallop appreciated.  Abdomen: Soft,  non distended. Mild RUQ, epigastric and LUQ tenderness without rebound or guarding. No masses. No hepatosplenomegaly. Normoactive bowel sounds x 4 quadrants.  Rectal: Deferred.  Musculoskeletal: Symmetrical with no gross deformities. Skin: Warm and dry. No rash or lesions on visible extremities. Extremities: No  edema. Neurological: Alert oriented x 4, no focal deficits.  Psychological:  Alert and cooperative. Normal mood and affect.  ASSESSMENT AND PLAN:  32. 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 Father with history of liver cancer.  -I reviewed liver biopsy report which confirmed adenocarcinoma with the patient. She is in agreement to proceed with oncology evaluation and treatment.  -Chest CT with IV contrast for cancer staging  -Urgent oncology referral for liver adenocarcinoma evaluation and treatment  -BMP, hepatic panel, Hep B surface antigen, Hep B surface antibody, Hep B core total and Hep C antibody -Zofran 2m ODT one tab Q 6 hrs PRN  -Await further recommendations from oncology   2. Upper abdominal pain secondary to # 1 -No plans for endoscopic evaluation at this time  -Continue Omeprazole 268mQD for now  3. Hypertension -Patient to re-establish care with prior PCP Dr. LoGarlan Fillers4. Abnormal uterine bleeding. History of uterine fibroids.  -Gyn referral      CC:  LoNuala AlphaDO

## 2020-07-29 ENCOUNTER — Telehealth: Payer: Self-pay | Admitting: *Deleted

## 2020-07-29 ENCOUNTER — Ambulatory Visit (INDEPENDENT_AMBULATORY_CARE_PROVIDER_SITE_OTHER): Payer: 59 | Admitting: Nurse Practitioner

## 2020-07-29 ENCOUNTER — Other Ambulatory Visit (INDEPENDENT_AMBULATORY_CARE_PROVIDER_SITE_OTHER): Payer: 59

## 2020-07-29 ENCOUNTER — Encounter: Payer: Self-pay | Admitting: Nurse Practitioner

## 2020-07-29 VITALS — BP 110/70 | HR 92 | Ht 64.0 in | Wt 143.0 lb

## 2020-07-29 DIAGNOSIS — C229 Malignant neoplasm of liver, not specified as primary or secondary: Secondary | ICD-10-CM

## 2020-07-29 DIAGNOSIS — N939 Abnormal uterine and vaginal bleeding, unspecified: Secondary | ICD-10-CM | POA: Diagnosis not present

## 2020-07-29 DIAGNOSIS — R1084 Generalized abdominal pain: Secondary | ICD-10-CM

## 2020-07-29 DIAGNOSIS — R110 Nausea: Secondary | ICD-10-CM

## 2020-07-29 LAB — BASIC METABOLIC PANEL
BUN: 9 mg/dL (ref 6–23)
CO2: 31 mEq/L (ref 19–32)
Calcium: 9.7 mg/dL (ref 8.4–10.5)
Chloride: 98 mEq/L (ref 96–112)
Creatinine, Ser: 0.62 mg/dL (ref 0.40–1.20)
GFR: 109.16 mL/min (ref >60.00)
Glucose, Bld: 110 mg/dL — ABNORMAL HIGH (ref 70–99)
Potassium: 5.3 mEq/L — ABNORMAL HIGH (ref 3.5–5.1)
Sodium: 136 mEq/L (ref 135–145)

## 2020-07-29 LAB — HEPATIC FUNCTION PANEL
ALT: 53 U/L — ABNORMAL HIGH (ref 0–35)
AST: 45 U/L — ABNORMAL HIGH (ref 0–37)
Albumin: 4 g/dL (ref 3.5–5.2)
Alkaline Phosphatase: 323 U/L — ABNORMAL HIGH (ref 39–117)
Bilirubin, Direct: 0.1 mg/dL (ref 0.0–0.3)
Total Bilirubin: 0.6 mg/dL (ref 0.2–1.2)
Total Protein: 8.1 g/dL (ref 6.0–8.3)

## 2020-07-29 LAB — HCG, QUANTITATIVE, PREGNANCY: Quantitative HCG: 1.11 m[IU]/mL

## 2020-07-29 MED ORDER — ONDANSETRON 4 MG PO TBDP: 4.0000 mg | ORAL_TABLET | Freq: Four times a day (QID) | ORAL | 0 refills | Status: DC | PRN

## 2020-07-29 NOTE — Telephone Encounter (Signed)
Wed 4/27th  at 3 pm, patient Is scheduled at [facility]

## 2020-07-29 NOTE — Telephone Encounter (Signed)
Referral placed to [facility] oncology for liver cancer , patient is having a CT Chest in the morning at [facility] for staging.

## 2020-07-29 NOTE — Patient Instructions (Addendum)
Your provider has requested that you go to the basement level for lab work before leaving today. Press "B" on the elevator. The lab is located at the first door on the left as you exit the elevator.  You have been scheduled for a CT of your chest at [facility] on 07/30/2020 at 10:30am, Arrive 30 minutes before your appointment, Go to [facility] admitting office to check in Nothing but liquids 4 hours prior to your test  We will contact [facility] for a Urgent consult referral , they will contact you with that appointment   We will refer you to a GYN group for Abnormal  Uterine bleeding   We will send Zofran to your pharmacy for nausea  Call the office if abdominal pain or nausea worsens   Due to recent changes in healthcare laws, you may see the results of your imaging and laboratory studies on MyChart before your provider has had a chance to review them.  We understand that in some cases there may be results that are confusing or concerning to you. Not all laboratory results come back in the same time frame and the provider may be waiting for multiple results in order to interpret others.  Please give [place] 48 hours in order for your provider to thoroughly review all the results before contacting the office for clarification of your results.    I appreciate the  opportunity to care for you  Thank You   [facility]

## 2020-07-29 NOTE — Progress Notes (Signed)
Agree with the assessment and plan as outlined by Carl Best, NP.  Plan for expedited Oncology referral for newly diagnosed liver adenocarcinoma along with viral hepatitis panel as outlined.  Ongoing evaluation and care along with possible referral for academic-center hepatology evaluation pending Oncology evaluation.   Gerrit Heck, DO, [facility]

## 2020-07-30 ENCOUNTER — Ambulatory Visit (HOSPITAL_COMMUNITY)
Admission: RE | Admit: 2020-07-30 | Discharge: 2020-07-30 | Disposition: A | Discharge status: Home / Self Care | Payer: 59 | Source: Ambulatory Visit | Attending: Nurse Practitioner | Admitting: Nurse Practitioner

## 2020-07-30 ENCOUNTER — Other Ambulatory Visit: Payer: Self-pay

## 2020-07-30 DIAGNOSIS — R1084 Generalized abdominal pain: Secondary | ICD-10-CM | POA: Insufficient documentation

## 2020-07-30 DIAGNOSIS — N939 Abnormal uterine and vaginal bleeding, unspecified: Secondary | ICD-10-CM | POA: Insufficient documentation

## 2020-07-30 DIAGNOSIS — R110 Nausea: Secondary | ICD-10-CM | POA: Diagnosis present

## 2020-07-30 DIAGNOSIS — C229 Malignant neoplasm of liver, not specified as primary or secondary: Secondary | ICD-10-CM | POA: Diagnosis present

## 2020-07-30 LAB — HEPATITIS C ANTIBODY
Hepatitis C Ab: NONREACTIVE
SIGNAL TO CUT-OFF: 0.01 (ref <1.00)

## 2020-07-30 LAB — HEPATITIS B CORE ANTIBODY, TOTAL: Hep B Core Total Ab: NONREACTIVE

## 2020-07-30 LAB — HEPATITIS B SURFACE ANTIBODY,QUALITATIVE: Hep B S Ab: NONREACTIVE

## 2020-07-30 LAB — HEPATITIS B SURFACE ANTIGEN: Hepatitis B Surface Ag: NONREACTIVE

## 2020-07-30 MED ORDER — IOHEXOL 300 MG/ML  SOLN
75.0000 mL | Freq: Once | INTRAMUSCULAR | Status: AC | PRN
  Administered 2020-07-30: 75 mL via INTRAVENOUS

## 2020-08-04 ENCOUNTER — Other Ambulatory Visit: Payer: Self-pay

## 2020-08-04 ENCOUNTER — Inpatient Hospital Stay: Payer: 59 | Attending: Hematology | Admitting: Hematology

## 2020-08-04 ENCOUNTER — Encounter: Payer: Self-pay | Admitting: Hematology

## 2020-08-04 DIAGNOSIS — R918 Other nonspecific abnormal finding of lung field: Secondary | ICD-10-CM | POA: Insufficient documentation

## 2020-08-04 DIAGNOSIS — G893 Neoplasm related pain (acute) (chronic): Secondary | ICD-10-CM | POA: Insufficient documentation

## 2020-08-04 DIAGNOSIS — R634 Abnormal weight loss: Secondary | ICD-10-CM | POA: Insufficient documentation

## 2020-08-04 DIAGNOSIS — C221 Intrahepatic bile duct carcinoma: Secondary | ICD-10-CM | POA: Diagnosis not present

## 2020-08-04 DIAGNOSIS — D259 Leiomyoma of uterus, unspecified: Secondary | ICD-10-CM | POA: Diagnosis not present

## 2020-08-04 DIAGNOSIS — I700 Atherosclerosis of aorta: Secondary | ICD-10-CM | POA: Insufficient documentation

## 2020-08-04 DIAGNOSIS — K219 Gastro-esophageal reflux disease without esophagitis: Secondary | ICD-10-CM | POA: Insufficient documentation

## 2020-08-04 DIAGNOSIS — Z79899 Other long term (current) drug therapy: Secondary | ICD-10-CM | POA: Insufficient documentation

## 2020-08-04 DIAGNOSIS — I10 Essential (primary) hypertension: Secondary | ICD-10-CM | POA: Diagnosis not present

## 2020-08-04 DIAGNOSIS — Z803 Family history of malignant neoplasm of breast: Secondary | ICD-10-CM | POA: Insufficient documentation

## 2020-08-04 DIAGNOSIS — N281 Cyst of kidney, acquired: Secondary | ICD-10-CM | POA: Insufficient documentation

## 2020-08-04 MED ORDER — TRAMADOL HCL 50 MG PO TABS: 50.0000 mg | ORAL_TABLET | Freq: Four times a day (QID) | ORAL | 0 refills | Status: DC | PRN

## 2020-08-04 MED ORDER — ONDANSETRON 4 MG PO TBDP: 4.0000 mg | ORAL_TABLET | Freq: Four times a day (QID) | ORAL | 1 refills | Status: DC | PRN

## 2020-08-04 MED ORDER — AMLODIPINE BESYLATE 2.5 MG PO TABS: 2.5000 mg | ORAL_TABLET | Freq: Every day | ORAL | 0 refills | Status: DC

## 2020-08-04 NOTE — Progress Notes (Addendum)
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  No acute hepatobiliary findings.   2. Heterogeneous liver echotexture with 3 masslike areas as described measuring 4.3 cm, 5.4 cm and 6.4 cm respectively. Recommend CT abdomen/pelvis with intravenous contrast for further evaluation.   07/10/2020 Imaging   CT AP 07/10/20  IMPRESSION: 1. There is a large masslike collection or mass in the central liver measuring at least 10.9 x 7.1 x 10.2 cm with 2 smaller adjacent satellite lesions/collections measuring 11 and 17 mm. These findings may represent a large abscess with satellite abscesses or a large neoplasm/malignancy. Recommend MRI for further evaluation. 2. Left renal cyst. 3. Calcified atherosclerosis in the distal abdominal aorta is mild. 4. Fibroid uterus.   07/10/2020 Imaging   MRI Abdomen 07/10/20  IMPRESSION: 1. The large central hepatic mass does not demonstrate signal characteristics or enhancement typical of a hemangioma or abscess, and is suspicious for malignancy. There is at least 1 satellite lesion centrally in the right lobe. As there are no signs of underlying cirrhosis, primary considerations include peripheral cholangiocarcinoma and metastatic disease. Tissue sampling recommended. 2. Mild  intrahepatic biliary dilatation within the left hepatic lobe. Distortion of the hepatic vasculature without evidence of tumor thrombus. 3. No extrahepatic metastatic disease or primary malignancy identified within the abdomen.   07/26/2020 Imaging   CT AP 07/26/20  IMPRESSION: 1. Markedly poorly visualized and ill-defined 12 cm lesion within the liver. This lesion is better evaluated on MR abdomen 07/10/2020. 2. Otherwise no acute intra-abdominal abnormality with markedly limited evaluation on this noncontrast study. 3.  Aortic Atherosclerosis (ICD10-I70.0).   07/26/2020 Initial Diagnosis   FINAL MICROSCOPIC DIAGNOSIS: 07/26/20  A. LIVER MASS, LEFT, NEEDLE CORE BIOPSY:  - Adenocarcinoma.  - See comment.  COMMENT:  Immunohistochemistry will be performed and reported as an addendum.    07/30/2020 Imaging   CT Chest 07/30/20  IMPRESSION: 1. Pulmonary nodules are highly worrisome for metastatic disease. 2. Hepatic masses are consistent with biopsy-proven adenocarcinoma and better evaluated on MR abdomen 07/10/2020.   08/04/2020 Initial Diagnosis   Intrahepatic cholangiocarcinoma ([place])   08/04/2020 Cancer Staging   Staging form: Intrahepatic Bile Duct, AJCC 8th Edition - Clinical: Stage IB (cT1b, cN0, cM0) - Signed by [name], MD on 08/04/2020      HISTORY OF PRESENTING ILLNESS:  Crystal Gibbs 45 y.o. female is a here because of adenocarcinoma of liver. The patient presents to the clinic today accompanied by translator and her husband.   She was having abdominal pain for 2 months before presenting to ED on 07/09/20. She was in [place] when this pain started and she was seen to have liver mass. She was seen by Medical Oncologist, but no biopsy was done. Today she brought her scans and reports done in March in [place]. She had MRI and labs. She notes she now mainly has abdominal  pain after eating and sitting down for long periods of time. Overall she feels discomfort daily but no true pain  daily. She was given Tramadol for pain during her April ED visit. This has helped her pain. She notes her BM are normal and denies N&V. She has lost 22 Kg recently. She has been taking weight loss pill called Re-eshape, but has not taken in the past 3 months.   She has a PMHx of acid reflux, HTN. She has had parathyroid surgery and cataract surgery. Her mother had GI cancer. She has tried IVF 3 times but had miscarriages each time, most recently in 04/2020, 7 weeks after conception. She had blood test and [place] to confirm her miscarriage. She notes vaginal spotting that is not part of her regular spotting.   Socially she is married and currently lives in [place]. She traveled often to [place] for her IVF and fertility work. She has tried for children but has had 4 miscarriage. She does not drink alcohol or smoke cigarettes.    REVIEW OF SYSTEMS:    Constitutional: Denies fevers, chills or abnormal night sweats (+) weight loss Eyes: Denies blurriness of vision, double vision or watery eyes Ears, nose, mouth, throat, and face: Denies mucositis or sore throat Respiratory: Denies cough, dyspnea or wheezes Cardiovascular: Denies palpitation, chest discomfort or lower extremity swelling Gastrointestinal:  Denies nausea, heartburn or change in bowel habits (+) Intermittent abdominal pain  Skin: Denies abnormal skin rashes Lymphatics: Denies new lymphadenopathy or easy bruising Neurological:Denies numbness, tingling or new weaknesses Behavioral/Psych: Mood is stable, no new changes  All other systems were reviewed with the patient and are negative.   MEDICAL HISTORY:  Past Medical History:  Diagnosis Date  . Cancer ([place])   . Fibroid   . History of multiple miscarriages    x 3  . Hypertension     SURGICAL HISTORY: Past Surgical History:  Procedure Laterality Date  . CATARACT EXTRACTION Bilateral   . PARATHYROIDECTOMY Right 02/19/15    SOCIAL HISTORY: Social History   Socioeconomic  History  . Marital status: Married    Spouse name: Not on file  . Number of children: 0  . Years of education: Not on file  . Highest education level: Not on file  Occupational History  . Occupation: unemployed  Tobacco Use  . Smoking status: Never Smoker  . Smokeless tobacco: Never Used  Vaping Use  . Vaping Use: Never used  Substance and Sexual Activity  . Alcohol use: No  . Drug use: No  . Sexual activity: Yes  Other Topics Concern  . Not on file  Social History Narrative  . Not on file   Social Determinants of Health   Financial Resource Strain: Not on file  Food Insecurity: Not on file  Transportation Needs: Not on file  Physical Activity: Not on file  Stress: Not on file  Social Connections: Not on file  Intimate Partner Violence: Not on file    FAMILY HISTORY: Family History  Problem Relation Age of Onset  . Pancreatic cancer Mother   . Diabetes Father   . Cancer Father        ? type  . Diabetes Sister   . Breast cancer Sister     ALLERGIES:  has No Known Allergies.  MEDICATIONS:  Current Outpatient Medications  Medication Sig Dispense Refill  . amLODipine (NORVASC) 2.5 MG tablet Take 1 tablet (2.5 mg total) by mouth daily. 30 tablet 0  . traMADol (ULTRAM) 50 MG  tablet Take 1 tablet (50 mg total) by mouth every 6 (six) hours as needed. 30 tablet 0  . ondansetron (ZOFRAN ODT) 4 MG disintegrating tablet Take 1-2 tablets (4-8 mg total) by mouth every 6 (six) hours as needed for nausea or vomiting. 30 tablet 1   No current facility-administered medications for this visit.    PHYSICAL EXAMINATION: ECOG PERFORMANCE STATUS: 1 - Symptomatic but completely ambulatory  Vitals:   08/04/20 1459  BP: (!) 148/97  Pulse: 98  Resp: 18  Temp: 97.9 F (36.6 C)  SpO2: 100%   Filed Weights   08/04/20 1459  Weight: 139 lb (63 kg)    GENERAL:alert, no distress and comfortable SKIN: skin color, texture, turgor are normal, no rashes or significant  lesions EYES: normal, Conjunctiva are pink and non-injected, sclera clear  NECK: supple, thyroid normal size, non-tender, without nodularity LYMPH:  no palpable lymphadenopathy in the cervical, axillary  LUNGS: clear to auscultation and percussion with normal breathing effort HEART: regular rate & rhythm and no murmurs and no lower extremity edema ABDOMEN:abdomen soft, non-tender and normal bowel sounds (+) diffuse upper abdominal tenderness (+) Mild Hepatomegaly  Musculoskeletal:no cyanosis of digits and no clubbing  NEURO: alert & oriented x 3 with fluent speech, no focal motor/sensory deficits  LABORATORY DATA:  I have reviewed the data as listed CBC Latest Ref Rng & Units 07/26/2020 07/09/2020 09/02/2015  WBC 4.0 - 10.5 K/uL 7.9 8.6 4.8  Hemoglobin 12.0 - 15.0 g/dL 13.8 13.4 13.1  Hematocrit 36.0 - 46.0 % 42.2 41.5 38.3  Platelets 150 - 400 K/uL 218 222 235    CMP Latest Ref Rng & Units 07/29/2020 07/09/2020 09/02/2015  Glucose 70 - 99 mg/dL 110(H) 101(H) 82  BUN 6 - 23 mg/dL 9 10 12   Creatinine 0.40 - 1.20 mg/dL 0.62 0.53 0.81  Sodium 135 - 145 mEq/L 136 134(L) 137  Potassium 3.5 - 5.1 mEq/L 5.3 No hemolysis seen(H) 4.6 3.8  Chloride 96 - 112 mEq/L 98 99 101  CO2 19 - 32 mEq/L 31 30 23   Calcium 8.4 - 10.5 mg/dL 9.7 9.0 9.1  Total Protein 6.0 - 8.3 g/dL 8.1 7.2 7.2  Total Bilirubin 0.2 - 1.2 mg/dL 0.6 0.8 0.5  Alkaline Phos 39 - 117 U/L 323(H) 243(H) 62  AST 0 - 37 U/L 45(H) 186(H) 14  ALT 0 - 35 U/L 53(H) 145(H) 10     RADIOGRAPHIC STUDIES: I have personally reviewed the radiological images as listed and agreed with the findings in the report. CT ABDOMEN WO CONTRAST  Result Date: 07/26/2020 CLINICAL DATA:  Abdominal trauma, penetrating r/o bleed post liver biopsy EXAM: CT ABDOMEN WITHOUT CONTRAST TECHNIQUE: Multidetector CT imaging of the abdomen was performed following the standard protocol without IV contrast. COMPARISON:  MR abdomen 07/10/2020, CT abdomen pelvis 07/10/2020.  Ultrasound biopsy 07/26/2020 FINDINGS: Lower chest: No acute abnormality. Hepatobiliary: Markedly poorly visualized and ill-defined 12 cm lesion within the liver. No gallstones, gallbladder wall thickening, or pericholecystic fluid. No biliary dilatation. Pancreas: No focal lesion. Normal pancreatic contour. No surrounding inflammatory changes. No main pancreatic ductal dilatation. Spleen: Normal in size without focal abnormality. Adrenals/Urinary Tract: No adrenal nodule bilaterally. There is a 1.6 cm fluid density lesion within left kidney that likely represents a simple renal cyst. No nephrolithiasis, no hydronephrosis, and no contour-deforming renal mass. No proximal ureterolithiasis or hydroureter. Stomach/Bowel: Stomach is within normal limits. No evidence of bowel wall thickening or dilatation of the visualized small and large bowel. Vascular/Lymphatic: No abdominal  aorta aneurysm. Mild atherosclerotic plaque of the aorta. No abdominal lymphadenopathy. Other: No intraperitoneal free fluid. No intraperitoneal free gas. No organized fluid collection. Musculoskeletal: No acute or significant osseous findings. IMPRESSION: 1. Markedly poorly visualized and ill-defined 12 cm lesion within the liver. This lesion is better evaluated on MR abdomen 07/10/2020. 2. Otherwise no acute intra-abdominal abnormality with markedly limited evaluation on this noncontrast study. 3.  Aortic Atherosclerosis (ICD10-I70.0). Electronically Signed   By: Iven Finn M.D.   On: 07/26/2020 21:26   CT CHEST W CONTRAST  Result Date: 07/30/2020 CLINICAL DATA:  Liver cancer diagnosed yesterday.  Staging. EXAM: CT CHEST WITH CONTRAST TECHNIQUE: Multidetector CT imaging of the chest was performed during intravenous contrast administration. CONTRAST:  58mL OMNIPAQUE IOHEXOL 300 MG/ML  SOLN COMPARISON:  CT abdomen pelvis 07/26/2020 MR abdomen 07/10/2020. FINDINGS: Cardiovascular: Vascular structures are unremarkable. Heart size normal. No  pericardial effusion. Mediastinum/Nodes: No pathologically enlarged mediastinal, hilar or axillary lymph nodes. Esophagus is grossly unremarkable. Lungs/Pleura: There are pulmonary nodules in the peripheral right upper lobe (5/52), right lower lobe (5/48) and left lower lobe (5/111), measuring up to 6 mm in the left lower lobe (5/111). Lungs are otherwise clear. No pleural fluid. Airway is unremarkable. Upper Abdomen: Ill-defined heterogeneous central liver mass measures approximately 7.4 x 10.9 cm. Smaller adjacent mass in the dome of the right hepatic lobe measures 2.8 x 3.2 cm. Visualized portions of the adrenal glands and right kidney are unremarkable. Low-attenuation lesion in the left kidney measures at least 1.7 cm, incompletely imaged and characterized as a simple cyst on 07/10/2020. Visualized portions of the spleen, pancreas, stomach and bowel are grossly unremarkable. Musculoskeletal: Mild degenerative changes in the spine. No worrisome lytic or sclerotic lesions. IMPRESSION: 1. Pulmonary nodules are highly worrisome for metastatic disease. 2. Hepatic masses are consistent with biopsy-proven adenocarcinoma and better evaluated on MR abdomen 07/10/2020. Electronically Signed   By: Lorin Picket M.D.   On: 07/30/2020 11:47   MR Abdomen W or Wo Contrast  Result Date: 07/10/2020 CLINICAL DATA:  Liver mass on CT and ultrasound. Hepatic biliary cancer, staging. EXAM: MRI ABDOMEN WITHOUT AND WITH CONTRAST TECHNIQUE: Multiplanar multisequence MR imaging of the abdomen was performed both before and after the administration of intravenous contrast. CONTRAST:  51mL GADAVIST GADOBUTROL 1 MMOL/ML IV SOLN COMPARISON:  CT and ultrasound studies earlier the same date. FINDINGS: Despite efforts by the technologist and patient, mild motion artifact is present on today's exam and could not be eliminated. This reduces exam sensitivity and specificity. Lower chest:  The visualized lower chest appears unremarkable.  Hepatobiliary: There are no morphologic changes of cirrhosis. As demonstrated on the earlier examinations, there is a very large central hepatic mass centered on segment 4, but involving the anterior segment of the right hepatic lobe (segments 5 and 8). This mass measures up to 12.1 x 8.0 cm on image 92/10. It demonstrates heterogeneous mildly increased T2 signal, low T1 signal and restricted diffusion. There is heterogeneous enhancement following contrast. There is at least 1 satellite nodule lateral to the IVC and anterior to the right hepatic vein, measuring 2.5 cm on image 89/10. Mild intrahepatic biliary dilatation is present peripherally in the left hepatic lobe. The gallbladder appears normal. There is no extrahepatic biliary dilatation. The central liver mass exerts mass effect on the hepatic and portal veins, but no tumor thrombus identified. Pancreas: Unremarkable. No pancreatic ductal dilatation or surrounding inflammatory changes. Spleen: Normal in size without focal abnormality. Adrenals/Urinary Tract: Both adrenal glands appear  normal. Simple left renal cyst. No evidence of renal mass or hydronephrosis. Stomach/Bowel: No bowel lesions identified within the abdomen. There is no bowel distension or surrounding inflammatory change. Vascular/Lymphatic: There are no enlarged abdominal lymph nodes. No acute vascular findings. As above, the central hepatic mass distorts the portal and hepatic veins, but there is no evidence of tumor thrombus. Other: No ascites or peritoneal nodularity. Musculoskeletal: No acute or significant osseous findings. IMPRESSION: 1. The large central hepatic mass does not demonstrate signal characteristics or enhancement typical of a hemangioma or abscess, and is suspicious for malignancy. There is at least 1 satellite lesion centrally in the right lobe. As there are no signs of underlying cirrhosis, primary considerations include peripheral cholangiocarcinoma and metastatic disease.  Tissue sampling recommended. 2. Mild intrahepatic biliary dilatation within the left hepatic lobe. Distortion of the hepatic vasculature without evidence of tumor thrombus. 3. No extrahepatic metastatic disease or primary malignancy identified within the abdomen. Electronically Signed   By: Carey Bullocks M.D.   On: 07/10/2020 16:10   CT ABDOMEN PELVIS W CONTRAST  Result Date: 07/10/2020 CLINICAL DATA:  Right upper quadrant pain for 3 weeks. EXAM: CT ABDOMEN AND PELVIS WITH CONTRAST TECHNIQUE: Multidetector CT imaging of the abdomen and pelvis was performed using the standard protocol following bolus administration of intravenous contrast. CONTRAST:  OMNIPAQUE IOHEXOL 300 MG/ML  SOLN COMPARISON:  Abdominal ultrasound July 10, 2020 FINDINGS: Lower chest: No acute abnormality. Hepatobiliary: There is a large masslike region in the central liver involving both the right and left hepatic lobes measuring 10.9 by 7.1 by 10.2 cm in transverse, AP, and craniocaudal dimensions. The mass is heterogeneous but primarily low in attenuation. There are at least 2 small satellite lesions as seen on series 4, image 11 in the dome measuring 11 mm and series 4, image 15 posterior to the main or larger mass/collection measuring up to 1.7 cm. The mass/dominant collection exerts mass effect on the intrahepatic portions of the portal vein without occlusion or thrombosis identified. Low-attenuation in the liver diffusely is likely due to mild hepatic steatosis. The gallbladder is normal in appearance. Pancreas: Unremarkable. No pancreatic ductal dilatation or surrounding inflammatory changes. Spleen: Normal in size without focal abnormality. Adrenals/Urinary Tract: Adrenal glands are normal. There is a cyst in the midpole of the left kidney. No suspicious renal masses. No hydronephrosis or perinephric stranding. The ureters and bladder are unremarkable. Stomach/Bowel: The distal esophagus, stomach, and small bowel are normal. The  colon and appendix are normal. Vascular/Lymphatic: Calcified atherosclerosis is seen in the distal abdominal aorta, mild. No adenopathy identified. Reproductive: A fibroid uterus is identified. The adnexa/ovaries are normal. Other: No free air or free fluid. Musculoskeletal: No acute or significant osseous findings. IMPRESSION: 1. There is a large masslike collection or mass in the central liver measuring at least 10.9 x 7.1 x 10.2 cm with 2 smaller adjacent satellite lesions/collections measuring 11 and 17 mm. These findings may represent a large abscess with satellite abscesses or a large neoplasm/malignancy. Recommend MRI for further evaluation. 2. Left renal cyst. 3. Calcified atherosclerosis in the distal abdominal aorta is mild. 4. Fibroid uterus. Electronically Signed   By: Gerome Sam III M.D   On: 07/10/2020 12:57   US BIOPSY (LIVER)  Result Date: 07/26/2020 INDICATION: 45 year old female with history of indeterminate liver mass. EXAM: ULTRASOUND GUIDED LIVER LESION BIOPSY COMPARISON:  None. MEDICATIONS: None ANESTHESIA/SEDATION: Moderate (conscious) sedation was employed during this procedure. A total of Versed 2 mg and  Fentanyl 100 mcg was administered intravenously. Moderate Sedation Time: 10 minutes. The patient's level of consciousness and vital signs were monitored continuously by radiology nursing throughout the procedure under my direct supervision. COMPLICATIONS: None immediate. PROCEDURE: Informed written consent was obtained from the patient after a discussion of the risks, benefits and alternatives to treatment. The patient understands and consents the procedure. A timeout was performed prior to the initiation of the procedure. Ultrasound scanning was performed of the right upper abdominal quadrant demonstrates similar appearing large left hepatic mass. The anterior portion mass in the left lobe was selected for biopsy and the procedure was planned. The subxiphoid region of the abdomen  was prepped and draped in the usual sterile fashion. The overlying soft tissues were anesthetized with 1% lidocaine with epinephrine. A 17 gauge, 6.8 cm co-axial needle was advanced into a peripheral aspect of the lesion. This was followed by 3 core biopsies with an 18 gauge core device under direct ultrasound guidance. The coaxial needle track was embolized with a small amount of Gel-Foam slurry and superficial hemostasis was obtained with manual compression. Post procedural scanning was negative for definitive area of hemorrhage or additional complication. A dressing was placed. The patient tolerated the procedure well without immediate post procedural IMPRESSION: Technically successful ultrasound guided core needle biopsy of left lobe liver mass. Marliss Cootsylan Suttle, MD [facility] Electronically Signed   By: Marliss Cootsylan  Suttle MD   On: 07/26/2020 16:29   US Abdomen Limited RUQ (LIVER/GB)  Result Date: 07/10/2020 CLINICAL DATA:  Right upper quadrant pain 3 weeks. EXAM: ULTRASOUND ABDOMEN LIMITED RIGHT UPPER QUADRANT COMPARISON:  None. FINDINGS: Gallbladder: No gallstones or wall thickening visualized. No sonographic Murphy sign noted by sonographer. Common bile duct: Diameter: 2.6 mm. Liver: Heterogeneous parenchymal echogenicity with 3 masslike areas measuring 4.3 cm, 5.4 cm and 6.4 cm respectively. Portal vein is patent on color Doppler imaging with normal direction of blood flow towards the liver. Other: Normal visualized pancreas. IMPRESSION: 1.  No acute hepatobiliary findings. 2. Heterogeneous liver echotexture with 3 masslike areas as described measuring 4.3 cm, 5.4 cm and 6.4 cm respectively. Recommend CT abdomen/pelvis with intravenous contrast for further evaluation. Electronically Signed   By: Elberta Fortisaniel  Boyle M.D.   On: 07/10/2020 10:29    ASSESSMENT & PLAN:  Elson Clanima Pinegar is a 45 y.o. female with   1. Adenocarcinoma of Liver, suspected  Cholangiocarcinoma  -I reviewed and discussed image findings and pathology report with patient and husband in great detail.  -After 2 months of abdominal pain she was seen to have 12cm liver mass concerning for cancer in March when she was in [place] and again in early April scans at our local hospital.  -Her Liver biopsy from 07/26/20 showed adenocarcinoma. I discussed this type of cancer could be from liver primary or from bile duct primary called Cholangiocarcinoma. There is also possibility her primary cancer is elsewhere, although imaging did not show other malignancy outside of liver. I dicussed scans have limitations.  -Her 07/10/20 MRI abdomen also shows there is at least 1 satellite lesion centrally in the right lobe and her 07/30/20 CT Chest shows Pulmonary nodules are highly worrisome for metastatic disease. Given size of lung nodules, we can monitor for now.  -I recommend upper endoscopy with Dr Barron Alvineirigliano to further evaluate her Upper GI to rule out primary upper GI malignancy. She is agreeable.  -I discussed based on the large size and central location of her liver mass, with satellite  lesion, this cancer is likely unresectable. I will discuss her case in next GI Tumor board for surgical opinion -I discussed if surgery is not an option, her cancer will not curable but still treatable. In that case, I would recommend systemic treatment such as chemotherapy to control her disease and prolong her life.  -I recommend chemotherapy with cisplatin, gemcitabine and Durvalumab based on the recent TOPAZ1 trial data.   --Chemotherapy consent: Side effects including but does not limited to, fatigue, nausea, vomiting, diarrhea, hair loss, neuropathy, fluid retention, renal and kidney dysfunction, neutropenic fever, needed for blood transfusion, bleeding, pneumonitis, colitis, other autoimmune disease and endocrine disorder, were discussed with patient in great detail. She is interested. Plan to start in 2-3  weeks with chemo education class and PAC placement beforehand.  -The goal of chemotherapy is palliative, if surgery is not an option -I will request Genomic testing to see if she is eligible for target therapy  -Will f/u with start of treatment.    2. Abdominal Pain, secondary to #1 -She has had abdominal pain for the last 2 months. The pain is not daily (mostly with eating and prolonged sitting) but discomfort is mostly daily.  -Pain started while she was in [place] and Work up was concerning for malignancy. She chose to return to the [place] for more workup.  -She was given Tramadol for her pain during her 07/09/20 ED visit, which has helped. I refilled today (08/04/20). I encouraged her to watch for constipation on pain medication.    3. Weight Loss, secondary to #1 -She initially was on weight loss pill RE-Eshape for her IVF. She stopped 3 months ago.  -She still has lost 22 kg recently. I recommend she no longer take any weight loss pill and work on maintaining and gaining weight given her cancer diagnosis.  -I encouraged her to increase protein and calorie intake. I will refer her to dietician for support. She is agreeable.    4. Social and Acupuncturist  -She lives in [place] with her husband, although she was recently frequently in [place] for. She is a [place] Citizen.  -She and her husband does not speak much [place]. They require interpretor, one of who they listed as a point of contact.   -She is not working. He has job, but not currently working, as to help his wife.  -She has [employer] health insurance. She does not have a PCP currently in the [place].  -I will refer her to SW and financial advocate for support and to possibly apply for Medicare/Medicaid.    5. Genetic testing  -Given her age and family history of GI cancer with her mother, she is eligible for genetic testing. She is interested, I will refer her.    PLAN:  -I refilled Tramadol, Zofran and amlodipine today  -Upper  Endoscopy in 1-2 weeks with Dr [name]  -Send genetic referral.  -Send dietician referral  -Chemo education class in 1-2 weeks.  -PAC placement in 1-2 weeks -Lab, f/u and chemo in 2 weeks    Orders Placed This Encounter  Procedures  . IR IMAGING GUIDED PORT INSERTION    Standing Status:   Future    Standing Expiration Date:   08/04/2021    Order Specific Question:   Reason for Exam (SYMPTOM  OR DIAGNOSIS REQUIRED)    Answer:   chemo    Order Specific Question:   Is the patient pregnant?    Answer:   No    Order Specific  Question:   Preferred Imaging Location?    Answer:   [facility]  . CBC with Differential ([place] Only)    Standing Status:   Standing    Number of Occurrences:   30    Standing Expiration Date:   08/04/2021  . CMP ([place] only)    Standing Status:   Standing    Number of Occurrences:   30    Standing Expiration Date:   08/04/2021  . CA 19.9    Standing Status:   Standing    Number of Occurrences:   30    Standing Expiration Date:   08/04/2021  . Ambulatory referral to Genetics    Referral Priority:   Routine    Referral Type:   Consultation    Referral Reason:   Specialty Services Required    Number of Visits Requested:   1  . Ambulatory Referral to [facility] Nutrition    Referral Priority:   Routine    Referral Type:   Consultation    Referral Reason:   Specialty Services Required    Number of Visits Requested:   1    All questions were answered. The patient knows to call the clinic with any problems, questions or concerns. The total time spent in the appointment was 60 minutes.     [name], MD 08/04/2020   I, Joslyn Devon, am acting as scribe for [name], MD.   I have reviewed the above documentation for accuracy and completeness, and I agree with the above.

## 2020-08-04 NOTE — Progress Notes (Signed)
START OFF PATHWAY REGIMEN - Other   [id number]:Cisplatin 25 mg/m2 D1,8 + Gemcitabine 1,000 mg/m2 D1,8 q21 Days:   A cycle is every 21 days:     Gemcitabine      Cisplatin   **Always confirm dose/schedule in your pharmacy ordering system**  Patient Characteristics: Intent of Therapy: Non-Curative / Palliative Intent, Discussed with Patient 

## 2020-08-05 ENCOUNTER — Encounter: Payer: Self-pay | Admitting: General Practice

## 2020-08-05 ENCOUNTER — Other Ambulatory Visit: Payer: Self-pay

## 2020-08-05 ENCOUNTER — Telehealth: Payer: Self-pay

## 2020-08-05 DIAGNOSIS — R160 Hepatomegaly, not elsewhere classified: Secondary | ICD-10-CM

## 2020-08-05 DIAGNOSIS — R932 Abnormal findings on diagnostic imaging of liver and biliary tract: Secondary | ICD-10-CM

## 2020-08-05 DIAGNOSIS — C229 Malignant neoplasm of liver, not specified as primary or secondary: Secondary | ICD-10-CM

## 2020-08-05 NOTE — Progress Notes (Signed)
Met with patient, her husband and [place] interpreter today at her initial medical oncology consult with Dr. [name].  I introduced myself and explained my roles as nurse navigator.  The patient states that she has hypertension and is almost out of her blood pressure medication and is asking if [name] will prescribe.  I have made [name] aware.  The patient describes abdominal discomfort mainly after eating, mild nausea and no vomiting.  She has been traveling back and forth to her country for IVF three separate times resulting in miscarriage each time most recently in January of this year.  The patient appears tearful at times, speaks a little [place], her husband speaks [place].  The interpreter is known personally to them and they have asked for me to add her as a second contact which I have done in the chart.

## 2020-08-05 NOTE — Telephone Encounter (Signed)
Patient has been scheduled for an EGD at [facility] on Wednesday, 08/18/20 at 10 AM, she will need to arrive at 8:30 AM. Pre-screening COVID test is scheduled for Monday, 08/16/20 at 12:45 PM. Instructions sent via My Chart and mailed today.   Home number is unavailable when dialed. Lm on vm using interpreter services Delaine Lame - Interpreter ID [phone]) to have patient return call for appt information.  CASE ID: [id number]

## 2020-08-05 NOTE — Progress Notes (Signed)
[place] CSW Progress Notes  Request from [facility] RN to help patient understand how to apply for Medicaid.  Called patient using Language Line [phone]).  No answer, left VM w my contact information and encouragement to call back if desired.  Edwyna Shell, LCSW Clinical Social Worker Phone:  [phone]

## 2020-08-05 NOTE — Telephone Encounter (Signed)
-----   Message from Lavena Bullion, DO sent at 08/05/2020 12:36 PM EDT ----- Per [name], only need to set up for EGD. This can be done with me at [facility] or [place], whichever is quicker. Thanks.   ----- Message ----- From: [name], MD Sent: 08/04/2020   7:37 PM EDT To: [name], RN, Stark Klein, MD, #  [place],  This is a young lady with a large 12cm mass in central right lobe of liver and a satellite lesion, plus a few 4-80mm indeterminate lung lesions, suspicious for mets. 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 I told her it's probably not resectable, and will review with you. If yes, plan to move forward with chemo. Or we can re-evaluate her resectability after 3-4 months chemo.   Malachy Mood, please add to tumor board next week.    Dr. [name], please get her in for EGD at your earliest convenience, thanks   Malachy Mood, please help to get her port done by IR  [place], please check if she is eligible for financial assistance, thanks   [employer]

## 2020-08-06 ENCOUNTER — Telehealth: Payer: Self-pay | Admitting: Hematology

## 2020-08-06 NOTE — Telephone Encounter (Signed)
Received a message from Allene Pyo at [facility] requesting COVID test  Be rescheduled, patient has an 8-hour treatment on Monday, 08/16/20. Pre-screening COVID test has been rescheduled to Tuesday, 08/17/20 at 10:20 AM.

## 2020-08-06 NOTE — Telephone Encounter (Signed)
Home number that is listed comes up restricted when dialed per interpreter. Lm on vm via interpreter services ([place] [id number]) for patient to return call for appointment information.

## 2020-08-06 NOTE — Telephone Encounter (Signed)
Ambulatory referral to GI for pre-cert of procedure

## 2020-08-06 NOTE — Telephone Encounter (Signed)
Scheduled follow-up appointments per 4/27 los. Patient is aware. 

## 2020-08-09 ENCOUNTER — Other Ambulatory Visit: Payer: Self-pay

## 2020-08-09 DIAGNOSIS — C221 Intrahepatic bile duct carcinoma: Secondary | ICD-10-CM

## 2020-08-09 MED ORDER — PROCHLORPERAZINE MALEATE 10 MG PO TABS: 10.0000 mg | ORAL_TABLET | Freq: Four times a day (QID) | ORAL | 3 refills | Status: DC | PRN

## 2020-08-09 NOTE — Progress Notes (Signed)
Pharmacist Chemotherapy Monitoring - Initial Assessment    Anticipated start date: 08/16/20  Regimen:  . Are orders appropriate based on the patient's diagnosis, regimen, and cycle? Yes . Does the plan date match the patient's scheduled date? Yes . Is the sequencing of drugs appropriate? Yes . Are the premedications appropriate for the patient's regimen? Yes . Prior Authorization for treatment is: Not Started o If applicable, is the correct biosimilar selected based on the patient's insurance? Yes; pending PA review  Organ Function and Labs: Marland Kitchen Are dose adjustments needed based on the patient's renal function, hepatic function, or hematologic function? No . Are appropriate labs ordered prior to the start of patient's treatment? Yes . Other organ system assessment, if indicated: women of childbearing potential: pregnancy status  . The following baseline labs, if indicated, have been ordered: durvalumab: baseline TSH +/- T4  Dose Assessment: . Are the drug doses appropriate? Yes . Are the following correct: o Drug concentrations Yes o IV fluid compatible with drug Yes o Administration routes Yes o Timing of therapy Yes . If applicable, does the patient have documented access for treatment and/or plans for port-a-cath placement? yes . If applicable, have lifetime cumulative doses been properly documented and assessed? yes Lifetime Dose Tracking  No doses have been documented on this patient for the following tracked chemicals: Doxorubicin, Epirubicin, Idarubicin, Daunorubicin, Mitoxantrone, Bleomycin, Oxaliplatin, Carboplatin, Liposomal Doxorubicin  o   Toxicity Monitoring/Prevention: . The patient has the following take home antiemetics prescribed: Ondansetron . The patient has the following take home medications prescribed: N/A . Medication allergies and previous infusion related reactions, if applicable, have been reviewed and addressed. Yes . The patient's current medication list has  been assessed for drug-drug interactions with their chemotherapy regimen. no significant drug-drug interactions were identified on review.  Order Review: . Are the treatment plan orders signed? No . Is the patient scheduled to see a provider prior to their treatment? No  I verify that I have reviewed each item in the above checklist and answered each question accordingly.   Kennith Center, Pharm.D., CPP 08/09/2020@8 :04 AM

## 2020-08-09 NOTE — Telephone Encounter (Signed)
Called patient via Interpreter services [facility], Interpreter ID: [phone]) spoke with Sabino Gasser. We have provided him with the appointment information and instructions, he states that they have access to My Chart. Advised that I have sent the instructions there for them to review prior to patient's appointment. [name] will relay all of the information to the patient. [name] verbalized understanding and had no concerns at the end of the call.

## 2020-08-09 NOTE — Progress Notes (Signed)
The following biosimilar Udenyca ([id number]) has been selected for use in this patient.  Kennith Center, Pharm.D., CPP 08/09/2020@12 :45 PM

## 2020-08-10 ENCOUNTER — Other Ambulatory Visit: Payer: Self-pay

## 2020-08-10 ENCOUNTER — Encounter: Payer: Self-pay | Admitting: *Deleted

## 2020-08-10 ENCOUNTER — Inpatient Hospital Stay: Payer: 59 | Attending: Hematology

## 2020-08-10 ENCOUNTER — Encounter: Payer: Self-pay | Admitting: Nurse Practitioner

## 2020-08-10 ENCOUNTER — Other Ambulatory Visit: Payer: Self-pay | Admitting: Hematology

## 2020-08-10 DIAGNOSIS — B9681 Helicobacter pylori [H. pylori] as the cause of diseases classified elsewhere: Secondary | ICD-10-CM | POA: Insufficient documentation

## 2020-08-10 DIAGNOSIS — Z8042 Family history of malignant neoplasm of prostate: Secondary | ICD-10-CM | POA: Insufficient documentation

## 2020-08-10 DIAGNOSIS — D259 Leiomyoma of uterus, unspecified: Secondary | ICD-10-CM | POA: Insufficient documentation

## 2020-08-10 DIAGNOSIS — N281 Cyst of kidney, acquired: Secondary | ICD-10-CM | POA: Insufficient documentation

## 2020-08-10 DIAGNOSIS — M791 Myalgia, unspecified site: Secondary | ICD-10-CM | POA: Insufficient documentation

## 2020-08-10 DIAGNOSIS — E860 Dehydration: Secondary | ICD-10-CM | POA: Insufficient documentation

## 2020-08-10 DIAGNOSIS — R5383 Other fatigue: Secondary | ICD-10-CM | POA: Insufficient documentation

## 2020-08-10 DIAGNOSIS — Z5111 Encounter for antineoplastic chemotherapy: Secondary | ICD-10-CM | POA: Insufficient documentation

## 2020-08-10 DIAGNOSIS — R634 Abnormal weight loss: Secondary | ICD-10-CM | POA: Insufficient documentation

## 2020-08-10 DIAGNOSIS — G893 Neoplasm related pain (acute) (chronic): Secondary | ICD-10-CM | POA: Insufficient documentation

## 2020-08-10 DIAGNOSIS — Z803 Family history of malignant neoplasm of breast: Secondary | ICD-10-CM | POA: Insufficient documentation

## 2020-08-10 DIAGNOSIS — R918 Other nonspecific abnormal finding of lung field: Secondary | ICD-10-CM | POA: Insufficient documentation

## 2020-08-10 DIAGNOSIS — Z800 Family history of malignant neoplasm of digestive organs: Secondary | ICD-10-CM | POA: Insufficient documentation

## 2020-08-10 DIAGNOSIS — C221 Intrahepatic bile duct carcinoma: Secondary | ICD-10-CM

## 2020-08-10 DIAGNOSIS — R000 Tachycardia, unspecified: Secondary | ICD-10-CM | POA: Insufficient documentation

## 2020-08-10 DIAGNOSIS — I10 Essential (primary) hypertension: Secondary | ICD-10-CM | POA: Insufficient documentation

## 2020-08-10 DIAGNOSIS — Z79899 Other long term (current) drug therapy: Secondary | ICD-10-CM | POA: Insufficient documentation

## 2020-08-10 MED ORDER — LIDOCAINE-PRILOCAINE 2.5-2.5 % EX CREA: TOPICAL_CREAM | CUTANEOUS | 3 refills | Status: DC

## 2020-08-10 MED ORDER — PROCHLORPERAZINE MALEATE 10 MG PO TABS: 10.0000 mg | ORAL_TABLET | Freq: Four times a day (QID) | ORAL | 1 refills | Status: DC | PRN

## 2020-08-10 MED ORDER — ONDANSETRON HCL 8 MG PO TABS: 8.0000 mg | ORAL_TABLET | Freq: Two times a day (BID) | ORAL | 1 refills | Status: DC | PRN

## 2020-08-10 NOTE — Progress Notes (Signed)
Met with patient and interpreter in lobby.  Introduced myself as Arboriculturist and to offer available resources.  Discussed one-time $1000 Radio broadcast assistant to assist with personal expenses while going through treatment. Advised what is needed to apply(spouse income).  Gave her my card if interested in applying and for any additional financial questions or concerns.

## 2020-08-10 NOTE — Progress Notes (Signed)
[place] Work  Clinical Social Work received referral from medical oncology for [employer].  CSW met with patient and interpreter at [facility] to offer support and assess for needs.  Patient had question regarding Medicaid and community resources.  CSW provided brief education on Medicaid and Social Security Disability/SSI.  CSW and patient discussed resources provided by [facility] and patient was agreeable to a referral.  CSW completed and submitted the referral.  CSW also sent a referral to the [facility] transportation program.  Patients husband is currently providing transportation, and having to reduce his work hours.  CSW provided education on transportation program and patient was agreeable to the referral.  Patient did not express any additional needs at this time.  CSW provided contact information and encouraged patient to call with questions or concerns.     [name], MSW, LCSW, OSW-C Clinical Social Worker [facility] [phone]

## 2020-08-11 ENCOUNTER — Other Ambulatory Visit: Payer: Self-pay

## 2020-08-11 ENCOUNTER — Other Ambulatory Visit: Payer: Self-pay | Admitting: Student

## 2020-08-11 ENCOUNTER — Encounter: Payer: Self-pay | Admitting: Hematology

## 2020-08-11 NOTE — Progress Notes (Signed)
Received income documents for grant.  Will meet with patient 04/15/08 to complete application process.

## 2020-08-11 NOTE — Progress Notes (Signed)
The proposed treatment discussed in conference is for discussion purposes only and is not a binding recommendation.  The patients have not been physically examined, or presented with their treatment options.  Therefore, final treatment plans cannot be decided.   

## 2020-08-13 ENCOUNTER — Ambulatory Visit (HOSPITAL_COMMUNITY)
Admission: RE | Admit: 2020-08-13 | Discharge: 2020-08-13 | Disposition: A | Discharge status: Home / Self Care | Payer: 59 | Source: Ambulatory Visit | Attending: Hematology | Admitting: Hematology

## 2020-08-13 ENCOUNTER — Encounter (HOSPITAL_COMMUNITY): Payer: Self-pay

## 2020-08-13 ENCOUNTER — Other Ambulatory Visit: Payer: Self-pay

## 2020-08-13 DIAGNOSIS — C221 Intrahepatic bile duct carcinoma: Secondary | ICD-10-CM | POA: Diagnosis present

## 2020-08-13 HISTORY — PX: IR IMAGING GUIDED PORT INSERTION: IMG5740

## 2020-08-13 MED ORDER — SODIUM CHLORIDE 0.9 % IV SOLN: INTRAVENOUS | Status: DC

## 2020-08-13 MED ORDER — LIDOCAINE HCL 1 % IJ SOLN
INTRAMUSCULAR | Status: AC
  Filled 2020-08-13: qty 20

## 2020-08-13 MED ORDER — FENTANYL CITRATE (PF) 100 MCG/2ML IJ SOLN
INTRAMUSCULAR | Status: AC
  Filled 2020-08-13: qty 2

## 2020-08-13 MED ORDER — MIDAZOLAM HCL 2 MG/2ML IJ SOLN
INTRAMUSCULAR | Status: AC | PRN
  Administered 2020-08-13 (×4): 1 mg via INTRAVENOUS

## 2020-08-13 MED ORDER — MIDAZOLAM HCL 2 MG/2ML IJ SOLN
INTRAMUSCULAR | Status: AC
  Filled 2020-08-13: qty 2

## 2020-08-13 MED ORDER — FENTANYL CITRATE (PF) 100 MCG/2ML IJ SOLN
INTRAMUSCULAR | Status: AC | PRN
  Administered 2020-08-13 (×3): 50 ug via INTRAVENOUS
  Administered 2020-08-13 (×2): 25 ug via INTRAVENOUS

## 2020-08-13 MED ORDER — HEPARIN SOD (PORK) LOCK FLUSH 100 UNIT/ML IV SOLN
INTRAVENOUS | Status: AC
  Filled 2020-08-13: qty 5

## 2020-08-13 MED ORDER — HEPARIN SOD (PORK) LOCK FLUSH 100 UNIT/ML IV SOLN
INTRAVENOUS | Status: AC | PRN
  Administered 2020-08-13: 500 [IU] via INTRAVENOUS

## 2020-08-13 NOTE — Discharge Instructions (Signed)
Please call Interventional Radiology clinic [phone] with any questions or concerns.  You may remove your dressing and shower tomorrow.   Implanted Port Insertion, Care After This sheet gives you information about how to care for yourself after your procedure. Your health care provider may also give you more specific instructions. If you have problems or questions, contact your health care provider. What can I expect after the procedure? After the procedure, it is common to have:  Discomfort at the port insertion site.  Bruising on the skin over the port. This should improve over 3-4 days. Follow these instructions at home: Port care  After your port is placed, you will get a manufacturer's information card. The card has information about your port. Keep this card with you at all times.  Take care of the port as told by your health care provider. Ask your health care provider if you or a family member can get training for taking care of the port at home. A home health care nurse may also take care of the port.  Make sure to remember what type of port you have. Incision care 1. Follow instructions from your health care provider about how to take care of your port insertion site. Make sure you: ? Wash your hands with soap and water before and after you change your bandage (dressing). If soap and water are not available, use hand sanitizer. ? Change your dressing as told by your health care provider. ? Leave stitches (sutures), skin glue, or adhesive strips in place. These skin closures may need to stay in place for 2 weeks or longer. If adhesive strip edges start to loosen and curl up, you may trim the loose edges. Do not remove adhesive strips completely unless your health care provider tells you to do that. 2. Check your port insertion site every day for signs of infection. Check for: ? Redness, swelling, or pain. ? Fluid or blood. ? Warmth. ? Pus or a bad smell.         Activity  Return to your normal activities as told by your health care provider. Ask your health care provider what activities are safe for you.  Do not lift anything that is heavier than 10 lb (4.5 kg), or the limit that you are told, until your health care provider says that it is safe. General instructions  Take over-the-counter and prescription medicines only as told by your health care provider.  Do not take baths, swim, or use a hot tub until your health care provider approves. Ask your health care provider if you may take showers. You may only be allowed to take sponge baths.  Do not drive for 24 hours if you were given a sedative during your procedure.  Wear a medical alert bracelet in case of an emergency. This will tell any health care providers that you have a port.  Keep all follow-up visits as told by your health care provider. This is important. Contact a health care provider if:  You cannot flush your port with saline as directed, or you cannot draw blood from the port.  You have a fever or chills.  You have redness, swelling, or pain around your port insertion site.  You have fluid or blood coming from your port insertion site.  Your port insertion site feels warm to the touch.  You have pus or a bad smell coming from the port insertion site. Get help right away if:  You have chest pain or shortness   of breath.  You have bleeding from your port that you cannot control. Summary  Take care of the port as told by your health care provider. Keep the manufacturer's information card with you at all times.  Change your dressing as told by your health care provider.  Contact a health care provider if you have a fever or chills or if you have redness, swelling, or pain around your port insertion site.  Keep all follow-up visits as told by your health care provider. This information is not intended to replace advice given to you by your health care provider. Make  sure you discuss any questions you have with your health care provider. Document Revised: 10/23/2017 Document Reviewed: 10/23/2017 Elsevier Patient Education  2021 Elsevier Inc.   Moderate Conscious Sedation, Adult, Care After This sheet gives you information about how to care for yourself after your procedure. Your health care provider may also give you more specific instructions. If you have problems or questions, contact your health care provider. What can I expect after the procedure? After the procedure, it is common to have:  Sleepiness for several hours.  Impaired judgment for several hours.  Difficulty with balance.  Vomiting if you eat too soon. Follow these instructions at home: For the time period you were told by your health care provider:  Rest.  Do not participate in activities where you could fall or become injured.  Do not drive or use machinery.  Do not drink alcohol.  Do not take sleeping pills or medicines that cause drowsiness.  Do not make important decisions or sign legal documents.  Do not take care of children on your own.        Eating and drinking 3. Follow the diet recommended by your health care provider. 4. Drink enough fluid to keep your urine pale yellow. 5. If you vomit: ? Drink water, juice, or soup when you can drink without vomiting. ? Make sure you have little or no nausea before eating solid foods.    General instructions  Take over-the-counter and prescription medicines only as told by your health care provider.  Have a responsible adult stay with you for the time you are told. It is important to have someone help care for you until you are awake and alert.  Do not smoke.  Keep all follow-up visits as told by your health care provider. This is important. Contact a health care provider if:  You are still sleepy or having trouble with balance after 24 hours.  You feel light-headed.  You keep feeling nauseous or you keep  vomiting.  You develop a rash.  You have a fever.  You have redness or swelling around the IV site. Get help right away if:  You have trouble breathing.  You have new-onset confusion at home. Summary  After the procedure, it is common to feel sleepy, have impaired judgment, or feel nauseous if you eat too soon.  Rest after you get home. Know the things you should not do after the procedure.  Follow the diet recommended by your health care provider and drink enough fluid to keep your urine pale yellow.  Get help right away if you have trouble breathing or new-onset confusion at home. This information is not intended to replace advice given to you by your health care provider. Make sure you discuss any questions you have with your health care provider. Document Revised: 07/25/2019 Document Reviewed: 02/20/2019 Elsevier Patient Education  2021 Elsevier Inc. 

## 2020-08-13 NOTE — H&P (Signed)
Chief Complaint: Patient was seen in consultation today for Port-A-Cath placement  Referring Physician(s): Feng,Yan  Supervising Physician: Aletta Edouard  Patient Status: [facility] - Out-pt  History of Present Illness: Crystal Gibbs is a 45 y.o. female with history of nausea, vomiting, abdominal pain recently diagnosed with intrahepatic cholangiocarcinoma. She has plans for upcoming chemotherapy and is in need of durable venous access.  She is referred to IR for Port-A-Cath placement.   Crystal Gibbs is assessed in [facility] Radiology.  She is accompanied by translator.  She denies fever, chills, nausea, vomiting, abdominal pain, dysuria.  She is aware of plans for Port-A-Cath placement today and is agreeable to proceed.  She has been NPO.   Past Medical History:  Diagnosis Date  . Cancer ([place])   . Fibroid   . History of multiple miscarriages    x 3  . Hypertension     Past Surgical History:  Procedure Laterality Date  . CATARACT EXTRACTION Bilateral   . PARATHYROIDECTOMY Right 02/19/15    Allergies: Patient has no known allergies.  Medications: Prior to Admission medications   Medication Sig Start Date End Date Taking? Authorizing Provider  amLODipine (NORVASC) 2.5 MG tablet Take 1 tablet (2.5 mg total) by mouth daily. 08/04/20   [name], MD  lidocaine-prilocaine (EMLA) cream Apply to affected area once 08/10/20   [name], MD  ondansetron (ZOFRAN ODT) 4 MG disintegrating tablet Take 1-2 tablets (4-8 mg total) by mouth every 6 (six) hours as needed for nausea or vomiting. 08/04/20   [name], MD  ondansetron (ZOFRAN) 8 MG tablet Take 1 tablet (8 mg total) by mouth 2 (two) times daily as needed. Start on the third day after cisplatin chemotherapy. 08/10/20   [name], MD  prochlorperazine (COMPAZINE) 10 MG tablet Take 1 tablet (10 mg total) by mouth every 6 (six) hours as needed for nausea or vomiting. 08/09/20   [name], MD  prochlorperazine (COMPAZINE) 10 MG tablet Take 1 tablet (10  mg total) by mouth every 6 (six) hours as needed (Nausea or vomiting). 08/10/20   [name], MD  traMADol (ULTRAM) 50 MG tablet Take 1 tablet (50 mg total) by mouth every 6 (six) hours as needed. 08/04/20   [name], MD     Family History  Problem Relation Age of Onset  . Pancreatic cancer Mother   . Diabetes Father   . Cancer Father        ? type  . Diabetes Sister   . Breast cancer Sister     Social History   Socioeconomic History  . Marital status: Married    Spouse name: Not on file  . Number of children: 0  . Years of education: Not on file  . Highest education level: Not on file  Occupational History  . Occupation: unemployed  Tobacco Use  . Smoking status: Never Smoker  . Smokeless tobacco: Never Used  Vaping Use  . Vaping Use: Never used  Substance and Sexual Activity  . Alcohol use: No  . Drug use: No  . Sexual activity: Yes  Other Topics Concern  . Not on file  Social History Narrative  . Not on file   Social Determinants of Health   Financial Resource Strain: Not on file  Food Insecurity: Not on file  Transportation Needs: Not on file  Physical Activity: Not on file  Stress: Not on file  Social Connections: Not on file     Review of Systems: A 12 point ROS  discussed and pertinent positives are indicated in the HPI above.  All other systems are negative.  Review of Systems  Constitutional: Negative for fatigue and fever.  Respiratory: Negative for cough and shortness of breath.   Cardiovascular: Negative for chest pain.  Gastrointestinal: Positive for abdominal pain (stable), nausea (stable) and vomiting (stable).  Musculoskeletal: Negative for back pain.  Psychiatric/Behavioral: Negative for behavioral problems and confusion.    Vital Signs: BP (!) 129/94   Pulse 90   Temp 98.4 F (36.9 C) (Oral)   Resp 16   LMP 08/05/2020   SpO2 100%   Physical Exam Vitals and nursing note reviewed.  Constitutional:      General: She is not in acute  distress.    Appearance: Normal appearance. She is not ill-appearing.  HENT:     Mouth/Throat:     Mouth: Mucous membranes are moist.     Pharynx: Oropharynx is clear.  Cardiovascular:     Rate and Rhythm: Normal rate and regular rhythm.  Pulmonary:     Effort: Pulmonary effort is normal.     Breath sounds: Normal breath sounds.  Musculoskeletal:     Cervical back: Normal range of motion and neck supple.  Skin:    General: Skin is warm and dry.  Neurological:     General: No focal deficit present.     Mental Status: She is alert and oriented to person, place, and time. Mental status is at baseline.  Psychiatric:        Mood and Affect: Mood normal.        Behavior: Behavior normal.        Thought Content: Thought content normal.        Judgment: Judgment normal.      MD Evaluation Airway: WNL Heart: WNL Abdomen: WNL Chest/ Lungs: WNL ASA  Classification: 3 Mallampati/Airway Score: One   Imaging: CT ABDOMEN WO CONTRAST  Result Date: 07/26/2020 CLINICAL DATA:  Abdominal trauma, penetrating r/o bleed post liver biopsy EXAM: CT ABDOMEN WITHOUT CONTRAST TECHNIQUE: Multidetector CT imaging of the abdomen was performed following the standard protocol without IV contrast. COMPARISON:  MR abdomen 07/10/2020, CT abdomen pelvis 07/10/2020. Ultrasound biopsy 07/26/2020 FINDINGS: Lower chest: No acute abnormality. Hepatobiliary: Markedly poorly visualized and ill-defined 12 cm lesion within the liver. No gallstones, gallbladder wall thickening, or pericholecystic fluid. No biliary dilatation. Pancreas: No focal lesion. Normal pancreatic contour. No surrounding inflammatory changes. No main pancreatic ductal dilatation. Spleen: Normal in size without focal abnormality. Adrenals/Urinary Tract: No adrenal nodule bilaterally. There is a 1.6 cm fluid density lesion within left kidney that likely represents a simple renal cyst. No nephrolithiasis, no hydronephrosis, and no contour-deforming renal  mass. No proximal ureterolithiasis or hydroureter. Stomach/Bowel: Stomach is within normal limits. No evidence of bowel wall thickening or dilatation of the visualized small and large bowel. Vascular/Lymphatic: No abdominal aorta aneurysm. Mild atherosclerotic plaque of the aorta. No abdominal lymphadenopathy. Other: No intraperitoneal free fluid. No intraperitoneal free gas. No organized fluid collection. Musculoskeletal: No acute or significant osseous findings. IMPRESSION: 1. Markedly poorly visualized and ill-defined 12 cm lesion within the liver. This lesion is better evaluated on MR abdomen 07/10/2020. 2. Otherwise no acute intra-abdominal abnormality with markedly limited evaluation on this noncontrast study. 3.  Aortic Atherosclerosis (ICD10-I70.0). Electronically Signed   By: Iven Finn M.D.   On: 07/26/2020 21:26   CT CHEST W CONTRAST  Result Date: 07/30/2020 CLINICAL DATA:  Liver cancer diagnosed yesterday.  Staging. EXAM: CT CHEST WITH CONTRAST  TECHNIQUE: Multidetector CT imaging of the chest was performed during intravenous contrast administration. CONTRAST:  51mL OMNIPAQUE IOHEXOL 300 MG/ML  SOLN COMPARISON:  CT abdomen pelvis 07/26/2020 MR abdomen 07/10/2020. FINDINGS: Cardiovascular: Vascular structures are unremarkable. Heart size normal. No pericardial effusion. Mediastinum/Nodes: No pathologically enlarged mediastinal, hilar or axillary lymph nodes. Esophagus is grossly unremarkable. Lungs/Pleura: There are pulmonary nodules in the peripheral right upper lobe (5/52), right lower lobe (5/48) and left lower lobe (5/111), measuring up to 6 mm in the left lower lobe (5/111). Lungs are otherwise clear. No pleural fluid. Airway is unremarkable. Upper Abdomen: Ill-defined heterogeneous central liver mass measures approximately 7.4 x 10.9 cm. Smaller adjacent mass in the dome of the right hepatic lobe measures 2.8 x 3.2 cm. Visualized portions of the adrenal glands and right kidney are  unremarkable. Low-attenuation lesion in the left kidney measures at least 1.7 cm, incompletely imaged and characterized as a simple cyst on 07/10/2020. Visualized portions of the spleen, pancreas, stomach and bowel are grossly unremarkable. Musculoskeletal: Mild degenerative changes in the spine. No worrisome lytic or sclerotic lesions. IMPRESSION: 1. Pulmonary nodules are highly worrisome for metastatic disease. 2. Hepatic masses are consistent with biopsy-proven adenocarcinoma and better evaluated on MR abdomen 07/10/2020. Electronically Signed   By: Lorin Picket M.D.   On: 07/30/2020 11:47   US BIOPSY (LIVER)  Result Date: 07/26/2020 INDICATION: 45 year old female with history of indeterminate liver mass. EXAM: ULTRASOUND GUIDED LIVER LESION BIOPSY COMPARISON:  None. MEDICATIONS: None ANESTHESIA/SEDATION: Moderate (conscious) sedation was employed during this procedure. A total of Versed 2 mg and Fentanyl 100 mcg was administered intravenously. Moderate Sedation Time: 10 minutes. The patient's level of consciousness and vital signs were monitored continuously by radiology nursing throughout the procedure under my direct supervision. COMPLICATIONS: None immediate. PROCEDURE: Informed written consent was obtained from the patient after a discussion of the risks, benefits and alternatives to treatment. The patient understands and consents the procedure. A timeout was performed prior to the initiation of the procedure. Ultrasound scanning was performed of the right upper abdominal quadrant demonstrates similar appearing large left hepatic mass. The anterior portion mass in the left lobe was selected for biopsy and the procedure was planned. The subxiphoid region of the abdomen was prepped and draped in the usual sterile fashion. The overlying soft tissues were anesthetized with 1% lidocaine with epinephrine. A 17 gauge, 6.8 cm co-axial needle was advanced into a peripheral aspect of the lesion. This was  followed by 3 core biopsies with an 18 gauge core device under direct ultrasound guidance. The coaxial needle track was embolized with a small amount of Gel-Foam slurry and superficial hemostasis was obtained with manual compression. Post procedural scanning was negative for definitive area of hemorrhage or additional complication. A dressing was placed. The patient tolerated the procedure well without immediate post procedural IMPRESSION: Technically successful ultrasound guided core needle biopsy of left lobe liver mass. Ruthann Cancer, MD [facility] Electronically Signed   By: Ruthann Cancer MD   On: 07/26/2020 16:29    Labs:  CBC: Recent Labs    07/09/20 1932 07/26/20 1123  WBC 8.6 7.9  HGB 13.4 13.8  HCT 41.5 42.2  PLT 222 218    COAGS: Recent Labs    07/26/20 1123  INR 0.9    BMP: Recent Labs    07/09/20 1932 07/29/20 1109  NA 134* 136  K 4.6 5.3 No hemolysis seen*  CL 99 98  CO2 30  31  GLUCOSE 101* 110*  BUN 10 9  CALCIUM 9.0 9.7  CREATININE 0.53 0.62  GFRNONAA >60  --     LIVER FUNCTION TESTS: Recent Labs    07/09/20 1932 07/29/20 1108  BILITOT 0.8 0.6  AST 186* 45*  ALT 145* 53*  ALKPHOS 243* 323*  PROT 7.2 8.1  ALBUMIN 3.4* 4.0    TUMOR MARKERS: No results for input(s): AFPTM, CEA, CA199, CHROMGRNA in the last 8760 hours.  Assessment and Plan: Patient with past medical history of nausea, abdominal pain presents with complaint of recently diagnosed cholangiogcarcinoma.  IR consulted for Port-A-Cath placement at the request of [name]. Case reviewed by Dr. Kathlene Cote who approves patient for procedure.  Patient presents today in their usual state of health.  She has been NPO and is not currently on blood thinners.   Risks and benefits of image guided port-a-catheter placement was discussed with the patient including, but not limited to bleeding, infection, pneumothorax, or fibrin sheath  development and need for additional procedures.  All of the patient's questions were answered, patient is agreeable to proceed. Consent signed and in chart.  Thank you for this interesting consult.  I greatly enjoyed meeting [facility] and look forward to participating in their care.  A copy of this report was sent to the requesting provider on this date.  Electronically Signed: Docia Barrier, PA 08/13/2020, 2:23 PM   I spent a total of  30 Minutes   in face to face in clinical consultation, greater than 50% of which was counseling/coordinating care for intrahepatic cholangiocarcinoma.

## 2020-08-13 NOTE — Procedures (Signed)
Interventional Radiology Procedure Note  Procedure: Single Lumen Power Port Placement    Access:  Right IJ vein.  Findings: Catheter tip positioned at SVC/RA junction. Port is ready for immediate use.   Complications: None  EBL: < 10 mL  Recommendations:  - Ok to shower in 24 hours - Do not submerge for 7 days - Routine line care   Angelik Walls T. Gwynn Chalker, M.D Pager:  [phone]   

## 2020-08-15 NOTE — Progress Notes (Signed)
[place]   [phone] Fax:(336) [phone]   Clinic Follow up Note   Patient Care Team: Pcp, No as PCP - General [name], MD as Consulting Physician (Oncology) [name], RN as Oncology Nurse Navigator 08/16/2020  CHIEF COMPLAINT: Follow up cholangiocarcinoma   SUMMARY OF ONCOLOGIC HISTORY: Oncology History Overview Note  Cancer Staging Intrahepatic cholangiocarcinoma ([place]) Staging form: Intrahepatic Bile Duct, AJCC 8th Edition - Clinical: Stage IB (cT1b, cN0, cM0) - Signed by [name], MD on 08/04/2020    Intrahepatic cholangiocarcinoma ([place])  07/10/2020 Imaging   US Abdomen 07/10/20 IMPRESSION: 1.  No acute hepatobiliary findings.   2. Heterogeneous liver echotexture with 3 masslike areas as described measuring 4.3 cm, 5.4 cm and 6.4 cm respectively. Recommend CT abdomen/pelvis with intravenous contrast for further evaluation.   07/10/2020 Imaging   CT AP 07/10/20  IMPRESSION: 1. There is a large masslike collection or mass in the central liver measuring at least 10.9 x 7.1 x 10.2 cm with 2 smaller adjacent satellite lesions/collections measuring 11 and 17 mm. These findings may represent a large abscess with satellite abscesses or a large neoplasm/malignancy. Recommend MRI for further evaluation. 2. Left renal cyst. 3. Calcified atherosclerosis in the distal abdominal aorta is mild. 4. Fibroid uterus.   07/10/2020 Imaging   MRI Abdomen 07/10/20  IMPRESSION: 1. The large central hepatic mass does not demonstrate signal characteristics or enhancement typical of a hemangioma or abscess, and is suspicious for malignancy. There is at least 1 satellite lesion centrally in the right lobe. As there are no signs of underlying cirrhosis, primary considerations include peripheral cholangiocarcinoma and metastatic disease. Tissue sampling recommended. 2. Mild intrahepatic biliary dilatation within the left hepatic lobe. Distortion of the  hepatic vasculature without evidence of tumor thrombus. 3. No extrahepatic metastatic disease or primary malignancy identified within the abdomen.   07/26/2020 Imaging   CT AP 07/26/20  IMPRESSION: 1. Markedly poorly visualized and ill-defined 12 cm lesion within the liver. This lesion is better evaluated on MR abdomen 07/10/2020. 2. Otherwise no acute intra-abdominal abnormality with markedly limited evaluation on this noncontrast study. 3.  Aortic Atherosclerosis (ICD10-I70.0).   07/26/2020 Initial Diagnosis   FINAL MICROSCOPIC DIAGNOSIS: 07/26/20  A. LIVER MASS, LEFT, NEEDLE CORE BIOPSY:  - Adenocarcinoma.  - See comment.  COMMENT:  Immunohistochemistry will be performed and reported as an addendum.    07/30/2020 Imaging   CT Chest 07/30/20  IMPRESSION: 1. Pulmonary nodules are highly worrisome for metastatic disease. 2. Hepatic masses are consistent with biopsy-proven adenocarcinoma and better evaluated on MR abdomen 07/10/2020.   08/04/2020 Initial Diagnosis   Intrahepatic cholangiocarcinoma ([place])   08/04/2020 Cancer Staging   Staging form: Intrahepatic Bile Duct, AJCC 8th Edition - Clinical: Stage IB (cT1b, cN0, cM0) - Signed by [name], MD on 08/04/2020   08/16/2020 -  Chemotherapy    Patient is on Treatment Plan: BILIARY TRACT CISPLATIN + GEMCITABINE D1,8 Q21D   Patient is on Antibody Plan: LUNG DURVALUMAB Q14D    08/16/2020 -  Chemotherapy    Patient is on Treatment Plan: BILIARY TRACT CISPLATIN + GEMCITABINE D1,8 Q21D   Patient is on Antibody Plan: LUNG DURVALUMAB Q14D      CURRENT THERAPY: First line gemcitabine and cisplatin days 1, 8 q21 days plus durvalumab   INTERVAL HISTORY: Crystal Gibbs presents for f/up and treatment as scheduled, she was seen in infusion room with interpreter. She gets very tired doing light house work. She has RUQ pain after prolonged  sitting and eating that occasionally radiates to right shoulder/back and epigastric pain. She has tramadol but  tries to avoid it. She can eat small amounts, drinking well. She gets nauseated if she eats too much. Denies fever, chills, cough, chest pain, dyspnea, leg edema, constipation, diarrhea, or other new concerns.    MEDICAL HISTORY:  Past Medical History:  Diagnosis Date  . Cancer ([place])   . Fibroid   . History of multiple miscarriages    x 3  . Hypertension     SURGICAL HISTORY: Past Surgical History:  Procedure Laterality Date  . CATARACT EXTRACTION Bilateral   . IR IMAGING GUIDED PORT INSERTION  08/13/2020  . PARATHYROIDECTOMY Right 02/19/15    I have reviewed the social history and family history with the patient and they are unchanged from previous note.  ALLERGIES:  has No Known Allergies.  MEDICATIONS:  Current Outpatient Medications  Medication Sig Dispense Refill  . dexamethasone (DECADRON) 4 MG tablet Take 2 tablets at breakfast for 3 days, starting the day after cisplatin 40 tablet 2  . magnesium oxide (MAG-OX) 400 (240 Mg) MG tablet Take 1 tablet (400 mg total) by mouth daily. 30 tablet 0  . potassium chloride SA (KLOR-CON) 20 MEQ tablet Take 1 tablet (20 mEq total) by mouth daily. 30 tablet 0  . amLODipine (NORVASC) 2.5 MG tablet Take 1 tablet (2.5 mg total) by mouth daily. 30 tablet 0  . lidocaine-prilocaine (EMLA) cream Apply to affected area once 30 g 3  . ondansetron (ZOFRAN ODT) 4 MG disintegrating tablet Take 1-2 tablets (4-8 mg total) by mouth every 6 (six) hours as needed for nausea or vomiting. 30 tablet 1  . ondansetron (ZOFRAN) 8 MG tablet Take 1 tablet (8 mg total) by mouth 2 (two) times daily as needed. Start on the third day after cisplatin chemotherapy. 30 tablet 1  . prochlorperazine (COMPAZINE) 10 MG tablet Take 1 tablet (10 mg total) by mouth every 6 (six) hours as needed for nausea or vomiting. 30 tablet 3  . prochlorperazine (COMPAZINE) 10 MG tablet Take 1 tablet (10 mg total) by mouth every 6 (six) hours as needed (Nausea or vomiting). 30 tablet 1  .  traMADol (ULTRAM) 50 MG tablet Take 1 tablet (50 mg total) by mouth every 6 (six) hours as needed. 30 tablet 0   No current facility-administered medications for this visit.   Facility-Administered Medications Ordered in Other Visits  Medication Dose Route Frequency Provider Last Rate Last Admin  . 0.9 %  sodium chloride infusion   Intravenous Once [name], MD      . CISplatin (PLATINOL) 42 mg in sodium chloride 0.9 % 250 mL chemo infusion  25 mg/m2 (Treatment Plan Recorded) Intravenous Once [name], MD      . durvalumab [facility]) 1,500 mg in sodium chloride 0.9 % 100 mL chemo infusion  1,500 mg Intravenous Once [name], MD 130 mL/hr at 08/16/20 1208 1,500 mg at 08/16/20 1208  . gemcitabine (GEMZAR) 1,672 mg in sodium chloride 0.9 % 250 mL chemo infusion  1,000 mg/m2 (Treatment Plan Recorded) Intravenous Once [name], MD      . heparin lock flush 100 unit/mL  500 Units Intracatheter Once PRN [name], MD      . sodium chloride flush (NS) 0.9 % injection 10 mL  10 mL Intracatheter PRN [name], MD        PHYSICAL EXAMINATION: ECOG PERFORMANCE STATUS: 2 - Symptomatic, <50% confined to bed  There were no  vitals filed for this visit. There were no vitals filed for this visit.  GENERAL:alert, no distress and comfortable SKIN: no rash. Henna to fingertips  EYES: sclera clear LUNGS: clear with normal breathing effort HEART: regular rate & rhythm, no lower extremity edema ABDOMEN:abdomen soft, non-tender and normal bowel sounds. No palpable mass in sitting position  Musculoskeletal: no focal tenderness  NEURO: alert & oriented x 3 with fluent speech, no focal motor/sensory deficits PAC covered with gauze. No surrounding erythema or edema   LABORATORY DATA:  I have reviewed the data as listed CBC Latest Ref Rng & Units 08/16/2020 07/26/2020 07/09/2020  WBC 4.0 - 10.5 K/uL 6.9 7.9 8.6  Hemoglobin 12.0 - 15.0 g/dL 13.1 13.8 13.4  Hematocrit 36.0 - 46.0 % 38.8 42.2 41.5  Platelets 150 -  400 K/uL 185 218 222     CMP Latest Ref Rng & Units 08/16/2020 07/29/2020 07/09/2020  Glucose 70 - 99 mg/dL 136(H) 110(H) 101(H)  BUN 6 - 20 mg/dL 8 9 10   Creatinine 0.44 - 1.00 mg/dL 0.61 0.62 0.53  Sodium 135 - 145 mmol/L 139 136 134(L)  Potassium 3.5 - 5.1 mmol/L 3.4(L) 5.3 No hemolysis seen(H) 4.6  Chloride 98 - 111 mmol/L 101 98 99  CO2 22 - 32 mmol/L 26 31 30   Calcium 8.9 - 10.3 mg/dL 9.1 9.7 9.0  Total Protein 6.5 - 8.1 g/dL 7.4 8.1 7.2  Total Bilirubin 0.3 - 1.2 mg/dL 0.5 0.6 0.8  Alkaline Phos 38 - 126 U/L 253(H) 323(H) 243(H)  AST 15 - 41 U/L 31 45(H) 186(H)  ALT 0 - 44 U/L 32 53(H) 145(H)      RADIOGRAPHIC STUDIES: I have personally reviewed the radiological images as listed and agreed with the findings in the report. No results found.   ASSESSMENT & PLAN: Crystal Gibbs is a 45 y.o. female with   1. 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 Scheduled for EGD 5/11 to r/o upper GI primary.  -Her 07/10/20 MRI abdomen also shows there is at least 1 satellite lesion centrally in the right lobe and her 07/30/20 CT Chest shows Pulmonary nodules are highly worrisome for metastatic disease. Given size of lung nodules, we can monitor for now.  -her case was discussed in tumor board, I reviewed with her today that given the clinical picture this is not resectable, and therefore not likely curable, but still treatable. The primary treatment will be chemotherapy and immunotherapy. The goal is palliative, to improve pain, control disease, and prolong her life. She understands.  -[name] has recommended first line chemotherapy with cisplatin, gemcitabine and Durvalumab based on the recent TOPAZ1 trial data. She consented.  -Genomic testing is pending, to see if she is eligible for target therapy  -s/p port placement, chemo class, and has picked up home meds.  -we reviewed at length  nutrition/hydration, potential treatment toxicities, symptom management, home meds, precautions, rationale, and goal of treatment.  -Ms. Allaire appears stable, with mild intermittent RUQ discomfort. Labs reviewed. CBC normal, LFTs improved. K 3.4, Mg 1.6 will supplement orally for now -Ur pregnancy is negative, she understands the importance to avoid pregnancy during chemo. She agrees to use contraception  -proceed with cycle 1 day 1 gem/cisplatin and imfinzi today, f/up and day 8 gem/cisplatin in 1 week.    2. Abdominal Pain, secondary to #1 -She has had RUQ and epigastric pain for the last 2 months, occasionally radiates to right shoulder/upper back. Occurs  mostly with eating and prolonged sitting -She was given Tramadol for her pain during her 07/09/20 ED visit, which has helped but she does not take it unless pain is severe.  -she can take tylenol 650 - 1000 mg BID PRN, I encouraged her to take when pain is moderate to avoid severe pain crisis. She understands.  -I discussed we will monitor pain as a clinical indicator for palliative treatment response  3. Weight Loss, secondary to #1 -She initially was on weight loss pill RE-Eshape for her IVF. She stopped 3 months ago.  -She still has lost 22 kg recently. [name] recommended she no longer take any weight loss pill and work on maintaining and gaining weight given her cancer diagnosis.  -we reviewed high calorie diet, protein, and eating small/frequent amounts to avoid post prandial pain and nausea.  -she will see dietician this week   4. Social and Acupuncturist  -She lives in [place] with her husband, although she was recently frequently in [place] for. She is a [place] Citizen.  -She and her husband does not speak much [place]. They require interpretor, one of who they listed as a point of contact.   -She is not working. He has job, but not currently working, as to help his wife.  -She has [employer] health insurance.  -she was referred  to establish care with new PCP, has appointment this week   -has been referred to SW and financial advocate for support and to possibly apply for Medicare/Medicaid.   5. Genetic testing  -Given her age and family history of GI cancer with her mother, she is eligible for genetic testing.  -scheduled 08/19/20  PLAN: -Tumor board discussion reviewed -Labs reviewed -Proceed with cycle 1 day 1 gem/cisplatin and imfinzi today -Begin oral K and Mg 1 tab BID -Rx: dex, reviewed other home meds and symptom management -Reviewed nutrition, chemo precautions, and treatment goal, side effects, and symptom management -PCP new consult 5/10  -EGD 5/11 to r/o upper GI primary  -Genetics and dietician 5/12 -F/up next week with C1D8 gem/cisplatin on 5/17  All questions were answered. The patient knows to call the clinic with any problems, questions or concerns. No barriers to learning were detected using in-person interpreter. Total encounter time was 40 minutes.      Alla Feeling, NP 08/16/20

## 2020-08-16 ENCOUNTER — Encounter: Payer: Self-pay | Admitting: Hematology

## 2020-08-16 ENCOUNTER — Inpatient Hospital Stay: Payer: 59

## 2020-08-16 ENCOUNTER — Other Ambulatory Visit (HOSPITAL_COMMUNITY): Payer: 59

## 2020-08-16 ENCOUNTER — Other Ambulatory Visit: Payer: Self-pay

## 2020-08-16 ENCOUNTER — Inpatient Hospital Stay (HOSPITAL_BASED_OUTPATIENT_CLINIC_OR_DEPARTMENT_OTHER): Payer: 59 | Admitting: Nurse Practitioner

## 2020-08-16 ENCOUNTER — Encounter: Payer: Self-pay | Admitting: Nurse Practitioner

## 2020-08-16 VITALS — BP 122/91 | HR 93 | Temp 98.0°F | Resp 18 | Ht 64.0 in | Wt 140.8 lb

## 2020-08-16 DIAGNOSIS — Z803 Family history of malignant neoplasm of breast: Secondary | ICD-10-CM | POA: Diagnosis not present

## 2020-08-16 DIAGNOSIS — M791 Myalgia, unspecified site: Secondary | ICD-10-CM | POA: Diagnosis not present

## 2020-08-16 DIAGNOSIS — D259 Leiomyoma of uterus, unspecified: Secondary | ICD-10-CM | POA: Diagnosis not present

## 2020-08-16 DIAGNOSIS — I10 Essential (primary) hypertension: Secondary | ICD-10-CM | POA: Diagnosis not present

## 2020-08-16 DIAGNOSIS — R634 Abnormal weight loss: Secondary | ICD-10-CM | POA: Diagnosis not present

## 2020-08-16 DIAGNOSIS — Z79899 Other long term (current) drug therapy: Secondary | ICD-10-CM | POA: Diagnosis not present

## 2020-08-16 DIAGNOSIS — R5383 Other fatigue: Secondary | ICD-10-CM | POA: Diagnosis not present

## 2020-08-16 DIAGNOSIS — G893 Neoplasm related pain (acute) (chronic): Secondary | ICD-10-CM | POA: Diagnosis not present

## 2020-08-16 DIAGNOSIS — C221 Intrahepatic bile duct carcinoma: Secondary | ICD-10-CM | POA: Diagnosis not present

## 2020-08-16 DIAGNOSIS — B9681 Helicobacter pylori [H. pylori] as the cause of diseases classified elsewhere: Secondary | ICD-10-CM | POA: Diagnosis not present

## 2020-08-16 DIAGNOSIS — N281 Cyst of kidney, acquired: Secondary | ICD-10-CM | POA: Diagnosis not present

## 2020-08-16 DIAGNOSIS — R918 Other nonspecific abnormal finding of lung field: Secondary | ICD-10-CM | POA: Diagnosis not present

## 2020-08-16 DIAGNOSIS — E860 Dehydration: Secondary | ICD-10-CM | POA: Diagnosis not present

## 2020-08-16 DIAGNOSIS — Z95828 Presence of other vascular implants and grafts: Secondary | ICD-10-CM | POA: Insufficient documentation

## 2020-08-16 DIAGNOSIS — R000 Tachycardia, unspecified: Secondary | ICD-10-CM | POA: Diagnosis not present

## 2020-08-16 DIAGNOSIS — Z5111 Encounter for antineoplastic chemotherapy: Secondary | ICD-10-CM | POA: Diagnosis not present

## 2020-08-16 DIAGNOSIS — Z8042 Family history of malignant neoplasm of prostate: Secondary | ICD-10-CM | POA: Diagnosis not present

## 2020-08-16 DIAGNOSIS — Z800 Family history of malignant neoplasm of digestive organs: Secondary | ICD-10-CM | POA: Diagnosis not present

## 2020-08-16 LAB — CBC WITH DIFFERENTIAL (CANCER CENTER ONLY)
Abs Immature Granulocytes: 0.02 10*3/uL (ref 0.00–0.07)
Basophils Absolute: 0 10*3/uL (ref 0.0–0.1)
Basophils Relative: 0 %
Eosinophils Absolute: 0.1 10*3/uL (ref 0.0–0.5)
Eosinophils Relative: 2 %
HCT: 38.8 % (ref 36.0–46.0)
Hemoglobin: 13.1 g/dL (ref 12.0–15.0)
Immature Granulocytes: 0 %
Lymphocytes Relative: 31 %
Lymphs Abs: 2.1 10*3/uL (ref 0.7–4.0)
MCH: 28.2 pg (ref 26.0–34.0)
MCHC: 33.8 g/dL (ref 30.0–36.0)
MCV: 83.4 fL (ref 80.0–100.0)
Monocytes Absolute: 0.6 10*3/uL (ref 0.1–1.0)
Monocytes Relative: 8 %
Neutro Abs: 4.1 10*3/uL (ref 1.7–7.7)
Neutrophils Relative %: 59 %
Platelet Count: 185 10*3/uL (ref 150–400)
RBC: 4.65 MIL/uL (ref 3.87–5.11)
RDW: 13.2 % (ref 11.5–15.5)
WBC Count: 6.9 10*3/uL (ref 4.0–10.5)
nRBC: 0 % (ref 0.0–0.2)

## 2020-08-16 LAB — MAGNESIUM: Magnesium: 1.6 mg/dL — ABNORMAL LOW (ref 1.7–2.4)

## 2020-08-16 LAB — PREGNANCY, URINE: Preg Test, Ur: NEGATIVE

## 2020-08-16 LAB — CMP (CANCER CENTER ONLY)
ALT: 32 U/L (ref 0–44)
AST: 31 U/L (ref 15–41)
Albumin: 3.3 g/dL — ABNORMAL LOW (ref 3.5–5.0)
Alkaline Phosphatase: 253 U/L — ABNORMAL HIGH (ref 38–126)
Anion gap: 12 (ref 5–15)
BUN: 8 mg/dL (ref 6–20)
CO2: 26 mmol/L (ref 22–32)
Calcium: 9.1 mg/dL (ref 8.9–10.3)
Chloride: 101 mmol/L (ref 98–111)
Creatinine: 0.61 mg/dL (ref 0.44–1.00)
GFR, Estimated: >60 mL/min (ref >60)
Glucose, Bld: 136 mg/dL — ABNORMAL HIGH (ref 70–99)
Potassium: 3.4 mmol/L — ABNORMAL LOW (ref 3.5–5.1)
Sodium: 139 mmol/L (ref 135–145)
Total Bilirubin: 0.5 mg/dL (ref 0.3–1.2)
Total Protein: 7.4 g/dL (ref 6.5–8.1)

## 2020-08-16 LAB — TSH: TSH: 2.014 u[IU]/mL (ref 0.308–3.960)

## 2020-08-16 MED ORDER — POTASSIUM CHLORIDE CRYS ER 20 MEQ PO TBCR: 20.0000 meq | EXTENDED_RELEASE_TABLET | Freq: Every day | ORAL | 0 refills | Status: DC

## 2020-08-16 MED ORDER — SODIUM CHLORIDE 0.9 % IV SOLN
Freq: Once | INTRAVENOUS | Status: AC
  Filled 2020-08-16: qty 250

## 2020-08-16 MED ORDER — SODIUM CHLORIDE 0.9 % IV SOLN
150.0000 mg | Freq: Once | INTRAVENOUS | Status: AC
  Administered 2020-08-16: 150 mg via INTRAVENOUS
  Filled 2020-08-16: qty 150

## 2020-08-16 MED ORDER — ACETAMINOPHEN 325 MG PO TABS
ORAL_TABLET | ORAL | Status: AC
  Filled 2020-08-16: qty 2

## 2020-08-16 MED ORDER — SODIUM CHLORIDE 0.9 % IV SOLN
Freq: Once | INTRAVENOUS | Status: DC
  Filled 2020-08-16: qty 250

## 2020-08-16 MED ORDER — MAGNESIUM SULFATE 2 GM/50ML IV SOLN
INTRAVENOUS | Status: AC
  Filled 2020-08-16: qty 50

## 2020-08-16 MED ORDER — PALONOSETRON HCL INJECTION 0.25 MG/5ML
INTRAVENOUS | Status: AC
  Filled 2020-08-16: qty 5

## 2020-08-16 MED ORDER — SODIUM CHLORIDE 0.9 % IV SOLN: Freq: Once | INTRAVENOUS | Status: DC

## 2020-08-16 MED ORDER — ACETAMINOPHEN 325 MG PO TABS
650.0000 mg | ORAL_TABLET | Freq: Once | ORAL | Status: AC
  Administered 2020-08-16: 650 mg via ORAL

## 2020-08-16 MED ORDER — POTASSIUM CHLORIDE IN NACL 20-0.9 MEQ/L-% IV SOLN
Freq: Once | INTRAVENOUS | Status: AC
  Filled 2020-08-16: qty 1000

## 2020-08-16 MED ORDER — MAGNESIUM OXIDE -MG SUPPLEMENT 400 (240 MG) MG PO TABS
400.0000 mg | ORAL_TABLET | Freq: Every day | ORAL | 0 refills | Status: DC
Start: 2020-08-16 — End: 2020-09-09

## 2020-08-16 MED ORDER — SODIUM CHLORIDE 0.9% FLUSH
10.0000 mL | INTRAVENOUS | Status: DC | PRN
  Administered 2020-08-16: 10 mL
  Filled 2020-08-16: qty 10

## 2020-08-16 MED ORDER — SODIUM CHLORIDE 0.9 % IV SOLN
10.0000 mg | Freq: Once | INTRAVENOUS | Status: AC
  Administered 2020-08-16: 10 mg via INTRAVENOUS
  Filled 2020-08-16: qty 10

## 2020-08-16 MED ORDER — MAGNESIUM SULFATE 2 GM/50ML IV SOLN
2.0000 g | Freq: Once | INTRAVENOUS | Status: AC
Start: 2020-08-16 — End: 2020-08-16
  Administered 2020-08-16: 2 g via INTRAVENOUS

## 2020-08-16 MED ORDER — SODIUM CHLORIDE 0.9 % IV SOLN
25.0000 mg/m2 | Freq: Once | INTRAVENOUS | Status: AC
  Administered 2020-08-16: 42 mg via INTRAVENOUS
  Filled 2020-08-16: qty 42

## 2020-08-16 MED ORDER — SODIUM CHLORIDE 0.9% FLUSH
10.0000 mL | Freq: Once | INTRAVENOUS | Status: AC
  Administered 2020-08-16: 10 mL
  Filled 2020-08-16: qty 10

## 2020-08-16 MED ORDER — DEXAMETHASONE 4 MG PO TABS: ORAL_TABLET | ORAL | 2 refills | Status: AC

## 2020-08-16 MED ORDER — SODIUM CHLORIDE 0.9 % IV SOLN
1000.0000 mg/m2 | Freq: Once | INTRAVENOUS | Status: AC
  Administered 2020-08-16: 1672 mg via INTRAVENOUS
  Filled 2020-08-16: qty 43.97

## 2020-08-16 MED ORDER — SODIUM CHLORIDE 0.9 % IV SOLN
1500.0000 mg | Freq: Once | INTRAVENOUS | Status: AC
  Administered 2020-08-16: 1500 mg via INTRAVENOUS
  Filled 2020-08-16: qty 30

## 2020-08-16 MED ORDER — HEPARIN SOD (PORK) LOCK FLUSH 100 UNIT/ML IV SOLN
500.0000 [IU] | Freq: Once | INTRAVENOUS | Status: AC | PRN
  Administered 2020-08-16: 500 [IU]
  Filled 2020-08-16: qty 5

## 2020-08-16 MED ORDER — PALONOSETRON HCL INJECTION 0.25 MG/5ML
0.2500 mg | Freq: Once | INTRAVENOUS | Status: AC
  Administered 2020-08-16: 0.25 mg via INTRAVENOUS

## 2020-08-16 NOTE — Progress Notes (Signed)
SUBJECTIVE:   CHIEF COMPLAINT / HPI: abdominal pain, prolonged menstrual bleeding and shoulder pain  Patient presents with in person interpreter and niece who both help with interpretation.   Patient recently diagnosed with liver cancer and needed a PCP.   Sees [name] at [facility] for chemotherapy treatment. 07/30/20   HTN  Oncology referred her for blood pressure medication.  Patient recommended to see PCP for blood pressure management  Has been taking amlodipine 2.5mg  twice daily, recommended to consult with PCP first before giving refills.  Patient denies dizziness or lightheadedness  patietn does not measure BP at home   Denies HA, blurry vision     Prolonged Uterine bleeding Had a miscarriage a few months ago and had a prolonged menses following that. During that time she was doing IVF treatments. Period lasted 40 days. Miscarraige and then had two normal menstrual periods and then had the prolonged bleeding. Reports heavy bleeding. Bleeding started March 30th and ended a few days ago. She reports still having a small amount of vaginal bleeding. Reports no cramping with bleeding. She reports feeling slightly feverish with the pain. She had a total of four miscarriages, at 2 months, 3 months, and once less than a month. No children. She was previously seeing a OB/GYN here at [place] and it has been several years since she was evaluated there. She was not seeing them at the time fo the most recent miscarriage.  Last pregnancy 5-6 months ago. She underwent IVF treatment in [place] for last pregnancy. She denies having any weakness or dizziness with the prolonged bleeding. Sometimes would have a back ache or headache and abodminal pain.   HM  Patient has not been vaccinated for COVID 19.  Patient reports she has not a pap smear before, agreeable to having one today.   PERTINENT  PMH / PSH:  Recurrent miscarriage  Hyperparathyroidism  Cholangiocarcinoma, active  treatment   OBJECTIVE:   BP 100/70   Pulse 72   Ht 5\' 4"  (1.626 m)   Wt 142 lb 12.8 oz (64.8 kg)   LMP 08/05/2020   SpO2 99%   BMI 24.51 kg/m   General: female appearing stated age in no acute distress HEENT: MMM, no oral lesions noted,Neck non-tender without lymphadenopathy, masses or thyromegaly Cardio: Normal S1 and S2, no S3 or S4. Rhythm is regular. No murmurs or rubs.  Bilateral radial pulses palpable Pulm: Clear to auscultation bilaterally, no crackles, wheezing, or diminished breath sounds. Normal respiratory effort, stable on RA Abdomen: Bowel sounds normal. Abdomen soft, tenderness in lower quadrants  Extremities: No peripheral edema. Warm/ well perfused.  Genitalia:  Normal introitus for age, no external lesions, no vaginal discharge, mucosa pink and moist, no vaginal or cervical lesions, no vaginal atrophy, no friaility, vaginal bleeding slowly oozing from os,minimal burgundy blood in vaginal vault, normal uterus size and position, no adnexal masses or tenderness   ASSESSMENT/PLAN:   Abnormal uterine bleeding (AUB) Patient with hx of uterine fibroid, also had recent miscarriage while undergoing IVF treatments. Has had two normal menses since SAB, however reports bleeding for 40 days.  - will start with transvaginal ultrasound to look for structural causes of bleeding  - recent CBC with normal Hgb so low concern for anemia due to bleeding    Elevated blood pressure reading Patient currently taking 5mg  amlodpine daily, previously on nifedipine daily prior to moving to [place]. Patient with lower BP today. Advised to stop nifedipine. Continue amlodipine 2.5mg  daily.  Advised to monitor for symptoms of hypotension including orthostatic changes, dizziness, lightheadedness. Patient and niece voiced understanding.   Health care maintenance Pap smear completed today      [name], MD [place]

## 2020-08-16 NOTE — Progress Notes (Signed)
Met with patient/interpreter at registration tto complete grant paperwork.  Patient approved for one-time $1000 [employer] grant to assist with personal expenses while going through treatment. Explained in detail expenses and how they are covered. Gave her a copy of the approval letter and expense sheet along with the Outpatient pharmacy information. She received a gift card today from her grant.  She has my card and grant information in green folder for any additional financial questions or concerns.

## 2020-08-16 NOTE — Patient Instructions (Signed)
[facility]  Discharge Instructions: Thank you for choosing [facility] to provide your oncology and hematology care.   If you have a lab appointment with the [facility], please go directly to the [facility] and check in at the registration area.   Wear comfortable clothing and clothing appropriate for easy access to any Portacath or PICC line.   We strive to give you quality time with your provider. You may need to reschedule your appointment if you arrive late (15 or more minutes).  Arriving late affects you and other patients whose appointments are after yours.  Also, if you miss three or more appointments without notifying the office, you may be dismissed from the clinic at the provider's discretion.      For prescription refill requests, have your pharmacy contact our office and allow 72 hours for refills to be completed.    Today you received the following chemotherapy and/or immunotherapy agents   Durvalumab (Imfinzi), Gemcitabine (Gemzar), Cisplatin    To help prevent nausea and vomiting after your treatment, we encourage you to take your nausea medication as directed.  BELOW ARE SYMPTOMS THAT SHOULD BE REPORTED IMMEDIATELY: . *FEVER GREATER THAN 100.4 F (38 C) OR HIGHER . *CHILLS OR SWEATING . *NAUSEA AND VOMITING THAT IS NOT CONTROLLED WITH YOUR NAUSEA MEDICATION . *UNUSUAL SHORTNESS OF BREATH . *UNUSUAL BRUISING OR BLEEDING . *URINARY PROBLEMS (pain or burning when urinating, or frequent urination) . *BOWEL PROBLEMS (unusual diarrhea, constipation, pain near the anus) . TENDERNESS IN MOUTH AND THROAT WITH OR WITHOUT PRESENCE OF ULCERS (sore throat, sores in mouth, or a toothache) . UNUSUAL RASH, SWELLING OR PAIN  . UNUSUAL VAGINAL DISCHARGE OR ITCHING   Items with * indicate a potential emergency and should be followed up as soon as possible or go to the Emergency Department if any problems should occur.  Please show the  CHEMOTHERAPY ALERT CARD or IMMUNOTHERAPY ALERT CARD at check-in to the Emergency Department and triage nurse.  Should you have questions after your visit or need to cancel or reschedule your appointment, please contact [facility]  Dept: [phone]  and follow the prompts.  Office hours are 8:00 a.m. to 4:30 p.m. Monday - Friday. Please note that voicemails left after 4:00 p.m. may not be returned until the following business day.  We are closed weekends and major holidays. You have access to a nurse at all times for urgent questions. Please call the main number to the clinic Dept: [phone] and follow the prompts.   For any non-urgent questions, you may also contact your provider using MyChart. We now offer e-Visits for anyone 29 and older to request care online for non-urgent symptoms. For details visit [email or web address].   Also download the MyChart app! Go to the app store, search "MyChart", open the app, select [facility], and log in with your MyChart username and password.  Due to Covid, a mask is required upon entering the hospital/clinic. If you do not have a mask, one will be given to you upon arrival. For doctor visits, patients may have 1 support person aged 89 or older with them. For treatment visits, patients cannot have anyone with them due to current Covid guidelines and our immunocompromised population.   Durvalumab (Imfinzi) What is this medicine? DURVALUMAB (dur VAL ue mab) is a monoclonal antibody. It is used to treat lung cancer. This medicine may be used for other purposes; ask your health care provider  or pharmacist if you have questions. COMMON BRAND NAME(S): IMFINZI What should I tell my health care provider before I take this medicine? They need to know if you have any of these conditions:  autoimmune diseases like Crohn's disease, ulcerative colitis, or lupus  have had or planning to have an allogeneic stem cell transplant (uses  someone else's stem cells)  history of organ transplant  history of radiation to the chest  nervous system problems like myasthenia gravis or Guillain-Barre syndrome  an unusual or allergic reaction to durvalumab, other medicines, foods, dyes, or preservatives  pregnant or trying to get pregnant  breast-feeding How should I use this medicine? This medicine is for infusion into a vein. It is given by a health care professional in a hospital or clinic setting. A special MedGuide will be given to you before each treatment. Be sure to read this information carefully each time. Talk to your pediatrician regarding the use of this medicine in children. Special care may be needed. Overdosage: If you think you have taken too much of this medicine contact a poison control center or emergency room at once. NOTE: This medicine is only for you. Do not share this medicine with others. What if I miss a dose? It is important not to miss your dose. Call your doctor or health care professional if you are unable to keep an appointment. What may interact with this medicine? Interactions have not been studied. This list may not describe all possible interactions. Give your health care provider a list of all the medicines, herbs, non-prescription drugs, or dietary supplements you use. Also tell them if you smoke, drink alcohol, or use illegal drugs. Some items may interact with your medicine. What should I watch for while using this medicine? This drug may make you feel generally unwell. Continue your course of treatment even though you feel ill unless your doctor tells you to stop. You may need blood work done while you are taking this medicine. Do not become pregnant while taking this medicine or for 3 months after stopping it. Women should inform their doctor if they wish to become pregnant or think they might be pregnant. There is a potential for serious side effects to an unborn child. Talk to your health  care professional or pharmacist for more information. Do not breast-feed an infant while taking this medicine or for 3 months after stopping it. What side effects may I notice from receiving this medicine? Side effects that you should report to your doctor or health care professional as soon as possible:  allergic reactions like skin rash, itching or hives, swelling of the face, lips, or tongue  black, tarry stools  bloody or watery diarrhea  breathing problems  change in emotions or moods  change in sex drive  changes in vision  chest pain or chest tightness  chills  confusion  cough  facial flushing  fever  headache  signs and symptoms of high blood sugar such as dizziness; dry mouth; dry skin; fruity breath; nausea; stomach pain; increased hunger or thirst; increased urination  signs and symptoms of liver injury like dark yellow or brown urine; general ill feeling or flu-like symptoms; light-colored stools; loss of appetite; nausea; right upper belly pain; unusually weak or tired; yellowing of the eyes or skin  stomach pain  trouble passing urine or change in the amount of urine  weight gain or weight loss Side effects that usually do not require medical attention (report these to your doctor  or health care professional if they continue or are bothersome):  bone pain  constipation  loss of appetite  muscle pain  nausea  swelling of the ankles, feet, hands  tiredness This list may not describe all possible side effects. Call your doctor for medical advice about side effects. You may report side effects to FDA at 1-800-FDA-1088. Where should I keep my medicine? This drug is given in a hospital or clinic and will not be stored at home. NOTE: This sheet is a summary. It may not cover all possible information. If you have questions about this medicine, talk to your doctor, pharmacist, or health care provider.  2021 Elsevier/Gold Standard (2019-06-05  13:01:29)  Gemcitabine (Gemzar) What is this medicine? GEMCITABINE (jem SYE ta been) is a chemotherapy drug. This medicine is used to treat many types of cancer like breast cancer, lung cancer, pancreatic cancer, and ovarian cancer. This medicine may be used for other purposes; ask your health care provider or pharmacist if you have questions. COMMON BRAND NAME(S): Gemzar, Infugem What should I tell my health care provider before I take this medicine? They need to know if you have any of these conditions:  blood disorders  infection  kidney disease  liver disease  lung or breathing disease, like asthma  recent or ongoing radiation therapy  an unusual or allergic reaction to gemcitabine, other chemotherapy, other medicines, foods, dyes, or preservatives  pregnant or trying to get pregnant  breast-feeding How should I use this medicine? This drug is given as an infusion into a vein. It is administered in a hospital or clinic by a specially trained health care professional. Talk to your pediatrician regarding the use of this medicine in children. Special care may be needed. Overdosage: If you think you have taken too much of this medicine contact a poison control center or emergency room at once. NOTE: This medicine is only for you. Do not share this medicine with others. What if I miss a dose? It is important not to miss your dose. Call your doctor or health care professional if you are unable to keep an appointment. What may interact with this medicine?  medicines to increase blood counts like filgrastim, pegfilgrastim, sargramostim  some other chemotherapy drugs like cisplatin  vaccines Talk to your doctor or health care professional before taking any of these medicines:  acetaminophen  aspirin  ibuprofen  ketoprofen  naproxen This list may not describe all possible interactions. Give your health care provider a list of all the medicines, herbs, non-prescription  drugs, or dietary supplements you use. Also tell them if you smoke, drink alcohol, or use illegal drugs. Some items may interact with your medicine. What should I watch for while using this medicine? Visit your doctor for checks on your progress. This drug may make you feel generally unwell. This is not uncommon, as chemotherapy can affect healthy cells as well as cancer cells. Report any side effects. Continue your course of treatment even though you feel ill unless your doctor tells you to stop. In some cases, you may be given additional medicines to help with side effects. Follow all directions for their use. Call your doctor or health care professional for advice if you get a fever, chills or sore throat, or other symptoms of a cold or flu. Do not treat yourself. This drug decreases your body's ability to fight infections. Try to avoid being around people who are sick. This medicine may increase your risk to bruise or bleed. Call  your doctor or health care professional if you notice any unusual bleeding. Be careful brushing and flossing your teeth or using a toothpick because you may get an infection or bleed more easily. If you have any dental work done, tell your dentist you are receiving this medicine. Avoid taking products that contain aspirin, acetaminophen, ibuprofen, naproxen, or ketoprofen unless instructed by your doctor. These medicines may hide a fever. Do not become pregnant while taking this medicine or for 6 months after stopping it. Women should inform their doctor if they wish to become pregnant or think they might be pregnant. Men should not father a child while taking this medicine and for 3 months after stopping it. There is a potential for serious side effects to an unborn child. Talk to your health care professional or pharmacist for more information. Do not breast-feed an infant while taking this medicine or for at least 1 week after stopping it. Men should inform their doctors if  they wish to father a child. This medicine may lower sperm counts. Talk with your doctor or health care professional if you are concerned about your fertility. What side effects may I notice from receiving this medicine? Side effects that you should report to your doctor or health care professional as soon as possible:  allergic reactions like skin rash, itching or hives, swelling of the face, lips, or tongue  breathing problems  pain, redness, or irritation at site where injected  signs and symptoms of a dangerous change in heartbeat or heart rhythm like chest pain; dizziness; fast or irregular heartbeat; palpitations; feeling faint or lightheaded, falls; breathing problems  signs of decreased platelets or bleeding - bruising, pinpoint red spots on the skin, black, tarry stools, blood in the urine  signs of decreased red blood cells - unusually weak or tired, feeling faint or lightheaded, falls  signs of infection - fever or chills, cough, sore throat, pain or difficulty passing urine  signs and symptoms of kidney injury like trouble passing urine or change in the amount of urine  signs and symptoms of liver injury like dark yellow or brown urine; general ill feeling or flu-like symptoms; light-colored stools; loss of appetite; nausea; right upper belly pain; unusually weak or tired; yellowing of the eyes or skin  swelling of ankles, feet, hands Side effects that usually do not require medical attention (report to your doctor or health care professional if they continue or are bothersome):  constipation  diarrhea  hair loss  loss of appetite  nausea  rash  vomiting This list may not describe all possible side effects. Call your doctor for medical advice about side effects. You may report side effects to FDA at 1-800-FDA-1088. Where should I keep my medicine? This drug is given in a hospital or clinic and will not be stored at home. NOTE: This sheet is a summary. It may not  cover all possible information. If you have questions about this medicine, talk to your doctor, pharmacist, or health care provider.  2021 Elsevier/Gold Standard (2017-06-20 18:06:11)  Cisplatin  What is this medicine? CISPLATIN (SIS pla tin) is a chemotherapy drug. It targets fast dividing cells, like cancer cells, and causes these cells to die. This medicine is used to treat many types of cancer like bladder, ovarian, and testicular cancers. This medicine may be used for other purposes; ask your health care provider or pharmacist if you have questions. COMMON BRAND NAME(S): Platinol, Platinol -AQ What should I tell my health care provider before  I take this medicine? They need to know if you have any of these conditions:  eye disease, vision problems  hearing problems  kidney disease  low blood counts, like white cells, platelets, or red blood cells  tingling of the fingers or toes, or other nerve disorder  an unusual or allergic reaction to cisplatin, carboplatin, oxaliplatin, other medicines, foods, dyes, or preservatives  pregnant or trying to get pregnant  breast-feeding How should I use this medicine? This drug is given as an infusion into a vein. It is administered in a hospital or clinic by a specially trained health care professional. Talk to your pediatrician regarding the use of this medicine in children. Special care may be needed. Overdosage: If you think you have taken too much of this medicine contact a poison control center or emergency room at once. NOTE: This medicine is only for you. Do not share this medicine with others. What if I miss a dose? It is important not to miss a dose. Call your doctor or health care professional if you are unable to keep an appointment. What may interact with this medicine? This medicine may interact with the following medications:  foscarnet  certain antibiotics like amikacin, gentamicin, neomycin, polymyxin B, streptomycin,  tobramycin, vancomycin This list may not describe all possible interactions. Give your health care provider a list of all the medicines, herbs, non-prescription drugs, or dietary supplements you use. Also tell them if you smoke, drink alcohol, or use illegal drugs. Some items may interact with your medicine. What should I watch for while using this medicine? Your condition will be monitored carefully while you are receiving this medicine. You will need important blood work done while you are taking this medicine. This drug may make you feel generally unwell. This is not uncommon, as chemotherapy can affect healthy cells as well as cancer cells. Report any side effects. Continue your course of treatment even though you feel ill unless your doctor tells you to stop. This medicine may increase your risk of getting an infection. Call your healthcare professional for advice if you get a fever, chills, or sore throat, or other symptoms of a cold or flu. Do not treat yourself. Try to avoid being around people who are sick. Avoid taking medicines that contain aspirin, acetaminophen, ibuprofen, naproxen, or ketoprofen unless instructed by your healthcare professional. These medicines may hide a fever. This medicine may increase your risk to bruise or bleed. Call your doctor or health care professional if you notice any unusual bleeding. Be careful brushing and flossing your teeth or using a toothpick because you may get an infection or bleed more easily. If you have any dental work done, tell your dentist you are receiving this medicine. Do not become pregnant while taking this medicine or for 14 months after stopping it. Women should inform their healthcare professional if they wish to become pregnant or think they might be pregnant. Men should not father a child while taking this medicine and for 11 months after stopping it. There is potential for serious side effects to an unborn child. Talk to your healthcare  professional for more information. Do not breast-feed an infant while taking this medicine. This medicine has caused ovarian failure in some women. This medicine may make it more difficult to get pregnant. Talk to your healthcare professional if you are concerned about your fertility. This medicine has caused decreased sperm counts in some men. This may make it more difficult to father a child. Talk to  your healthcare professional if you are concerned about your fertility. Drink fluids as directed while you are taking this medicine. This will help protect your kidneys. Call your doctor or health care professional if you get diarrhea. Do not treat yourself. What side effects may I notice from receiving this medicine? Side effects that you should report to your doctor or health care professional as soon as possible:  allergic reactions like skin rash, itching or hives, swelling of the face, lips, or tongue  blurred vision  changes in vision  decreased hearing or ringing of the ears  nausea, vomiting  pain, redness, or irritation at site where injected  pain, tingling, numbness in the hands or feet  signs and symptoms of bleeding such as bloody or black, tarry stools; red or dark brown urine; spitting up blood or brown material that looks like coffee grounds; red spots on the skin; unusual bruising or bleeding from the eyes, gums, or nose  signs and symptoms of infection like fever; chills; cough; sore throat; pain or trouble passing urine  signs and symptoms of kidney injury like trouble passing urine or change in the amount of urine  signs and symptoms of low red blood cells or anemia such as unusually weak or tired; feeling faint or lightheaded; falls; breathing problems Side effects that usually do not require medical attention (report to your doctor or health care professional if they continue or are bothersome):  loss of appetite  mouth sores  muscle cramps This list may not  describe all possible side effects. Call your doctor for medical advice about side effects. You may report side effects to FDA at 1-800-FDA-1088. Where should I keep my medicine? This drug is given in a hospital or clinic and will not be stored at home. NOTE: This sheet is a summary. It may not cover all possible information. If you have questions about this medicine, talk to your doctor, pharmacist, or health care provider.  2021 Elsevier/Gold Standard (2018-03-22 15:59:17)

## 2020-08-16 NOTE — Progress Notes (Signed)
Continue with treatment today from prior authorization team for insurance.  Per Ulice Dash.  Henreitta Leber, PharmD

## 2020-08-16 NOTE — Progress Notes (Signed)
Attempted to obtain medical history via telephone, unable to reach at this time. I left a voicemail to return pre surgical testing department's phone call. Used language line ID [phone]

## 2020-08-16 NOTE — Progress Notes (Signed)
Also, sent message to [facility] regarding follow up referral to [facility] for SSDI/Medicaid. Advised patient they will reach out to further answer questions regarding this. She asked about applying for food stamps. Advised this is done at the Department of Social Services or online. She verbalized understanding.

## 2020-08-17 ENCOUNTER — Encounter: Payer: Self-pay | Admitting: Family Medicine

## 2020-08-17 ENCOUNTER — Other Ambulatory Visit (HOSPITAL_COMMUNITY)
Admission: RE | Admit: 2020-08-17 | Discharge: 2020-08-17 | Disposition: A | Discharge status: Home / Self Care | Payer: 59 | Source: Ambulatory Visit | Attending: Gastroenterology | Admitting: Gastroenterology

## 2020-08-17 ENCOUNTER — Ambulatory Visit: Payer: 59 | Admitting: Family Medicine

## 2020-08-17 ENCOUNTER — Other Ambulatory Visit (HOSPITAL_COMMUNITY)
Admission: RE | Admit: 2020-08-17 | Discharge: 2020-08-17 | Disposition: A | Discharge status: Home / Self Care | Payer: 59 | Source: Ambulatory Visit | Attending: Family Medicine | Admitting: Family Medicine

## 2020-08-17 ENCOUNTER — Other Ambulatory Visit: Payer: Self-pay

## 2020-08-17 VITALS — BP 100/70 | HR 72 | Ht 64.0 in | Wt 142.8 lb

## 2020-08-17 DIAGNOSIS — Z20822 Contact with and (suspected) exposure to covid-19: Secondary | ICD-10-CM | POA: Insufficient documentation

## 2020-08-17 DIAGNOSIS — Z124 Encounter for screening for malignant neoplasm of cervix: Secondary | ICD-10-CM | POA: Insufficient documentation

## 2020-08-17 DIAGNOSIS — Z0000 Encounter for general adult medical examination without abnormal findings: Secondary | ICD-10-CM | POA: Diagnosis not present

## 2020-08-17 DIAGNOSIS — N939 Abnormal uterine and vaginal bleeding, unspecified: Secondary | ICD-10-CM | POA: Diagnosis not present

## 2020-08-17 DIAGNOSIS — R030 Elevated blood-pressure reading, without diagnosis of hypertension: Secondary | ICD-10-CM | POA: Diagnosis not present

## 2020-08-17 DIAGNOSIS — Z01812 Encounter for preprocedural laboratory examination: Secondary | ICD-10-CM | POA: Diagnosis present

## 2020-08-17 LAB — SARS CORONAVIRUS 2 (TAT 6-24 HRS): SARS Coronavirus 2: NEGATIVE

## 2020-08-17 LAB — CANCER ANTIGEN 19-9: CA 19-9: 65 U/mL — ABNORMAL HIGH (ref 0–35)

## 2020-08-17 LAB — T4: T4, Total: 8.2 ug/dL (ref 4.5–12.0)

## 2020-08-17 MED ORDER — AMLODIPINE BESYLATE 2.5 MG PO TABS: 2.5000 mg | ORAL_TABLET | Freq: Every day | ORAL | 1 refills | Status: DC

## 2020-08-17 NOTE — Anesthesia Preprocedure Evaluation (Addendum)
Anesthesia Evaluation  Patient identified by MRN, date of birth, ID band Patient awake    Reviewed: Allergy & Precautions, NPO status , Patient's Chart, lab work & pertinent test results  History of Anesthesia Complications Negative for: history of anesthetic complications  Airway Mallampati: II  TM Distance: >3 FB Neck ROM: Full    Dental no notable dental hx. (+) Dental Advisory Given   Pulmonary neg pulmonary ROS,    Pulmonary exam normal        Cardiovascular hypertension, Pt. 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  Renal/GU negative Renal ROS     Musculoskeletal negative musculoskeletal ROS (+)   Abdominal   Peds  Hematology negative hematology ROS (+)   Anesthesia Other Findings   Reproductive/Obstetrics                            Anesthesia Physical Anesthesia Plan  ASA: III  Anesthesia Plan: MAC   Post-op Pain Management:    Induction: Intravenous  PONV Risk Score and Plan: 2 and Ondansetron and Propofol infusion  Airway Management Planned: Natural Airway  Additional Equipment:   Intra-op Plan:   Post-operative Plan:   Informed Consent: I have reviewed the patients History and Physical, chart, labs and discussed the procedure including the risks, benefits and alternatives for the proposed anesthesia with the patient or authorized representative who has indicated his/her understanding and acceptance.     Dental advisory given  Plan Discussed with: Anesthesiologist and CRNA  Anesthesia Plan Comments:        Anesthesia Quick Evaluation

## 2020-08-17 NOTE — Patient Instructions (Addendum)
I will have my team work on scheduling an ultrasound. We will call to notify you of your scheduled appointment.   Today we completed a pap smear to screen for cervical cancer. I will follow up with you once results are available.   For you blood pressure, your numbers were actually low today so I recommend only taking the amlodipine 2.5 mg once daily.    I have scheduled a follow up appointment with me on June 1 at 9:30 AM.  Please call [phone] if you need to reschedule this appointment.

## 2020-08-18 ENCOUNTER — Other Ambulatory Visit: Payer: Self-pay

## 2020-08-18 ENCOUNTER — Telehealth: Payer: Self-pay

## 2020-08-18 ENCOUNTER — Encounter (HOSPITAL_COMMUNITY): Payer: Self-pay | Admitting: Gastroenterology

## 2020-08-18 ENCOUNTER — Ambulatory Visit (HOSPITAL_COMMUNITY)
Admission: RE | Admit: 2020-08-18 | Discharge: 2020-08-18 | Disposition: A | Discharge status: Home / Self Care | Payer: 59 | Attending: Gastroenterology | Admitting: Gastroenterology

## 2020-08-18 ENCOUNTER — Ambulatory Visit (HOSPITAL_COMMUNITY): Payer: 59 | Admitting: Anesthesiology

## 2020-08-18 ENCOUNTER — Encounter (HOSPITAL_COMMUNITY)
Admission: RE | Disposition: A | Discharge status: Home / Self Care | Payer: Self-pay | Source: Home / Self Care | Attending: Gastroenterology

## 2020-08-18 DIAGNOSIS — K2970 Gastritis, unspecified, without bleeding: Secondary | ICD-10-CM

## 2020-08-18 DIAGNOSIS — Z79899 Other long term (current) drug therapy: Secondary | ICD-10-CM | POA: Insufficient documentation

## 2020-08-18 DIAGNOSIS — E119 Type 2 diabetes mellitus without complications: Secondary | ICD-10-CM | POA: Insufficient documentation

## 2020-08-18 DIAGNOSIS — Z800 Family history of malignant neoplasm of digestive organs: Secondary | ICD-10-CM | POA: Diagnosis not present

## 2020-08-18 DIAGNOSIS — I10 Essential (primary) hypertension: Secondary | ICD-10-CM | POA: Diagnosis not present

## 2020-08-18 DIAGNOSIS — K3189 Other diseases of stomach and duodenum: Secondary | ICD-10-CM | POA: Insufficient documentation

## 2020-08-18 DIAGNOSIS — K2980 Duodenitis without bleeding: Secondary | ICD-10-CM | POA: Insufficient documentation

## 2020-08-18 DIAGNOSIS — K2950 Unspecified chronic gastritis without bleeding: Secondary | ICD-10-CM | POA: Insufficient documentation

## 2020-08-18 DIAGNOSIS — Z833 Family history of diabetes mellitus: Secondary | ICD-10-CM | POA: Insufficient documentation

## 2020-08-18 DIAGNOSIS — R932 Abnormal findings on diagnostic imaging of liver and biliary tract: Secondary | ICD-10-CM

## 2020-08-18 DIAGNOSIS — Z803 Family history of malignant neoplasm of breast: Secondary | ICD-10-CM | POA: Diagnosis not present

## 2020-08-18 DIAGNOSIS — R1010 Upper abdominal pain, unspecified: Secondary | ICD-10-CM | POA: Diagnosis present

## 2020-08-18 DIAGNOSIS — R160 Hepatomegaly, not elsewhere classified: Secondary | ICD-10-CM

## 2020-08-18 DIAGNOSIS — C229 Malignant neoplasm of liver, not specified as primary or secondary: Secondary | ICD-10-CM

## 2020-08-18 HISTORY — PX: ESOPHAGOGASTRODUODENOSCOPY (EGD) WITH PROPOFOL: SHX5813

## 2020-08-18 HISTORY — PX: BIOPSY: SHX5522

## 2020-08-18 LAB — PREGNANCY, URINE: Preg Test, Ur: NEGATIVE

## 2020-08-18 SURGERY — ESOPHAGOGASTRODUODENOSCOPY (EGD) WITH PROPOFOL
Anesthesia: Monitor Anesthesia Care

## 2020-08-18 MED ORDER — LACTATED RINGERS IV SOLN: INTRAVENOUS | Status: DC

## 2020-08-18 MED ORDER — PROPOFOL 500 MG/50ML IV EMUL
INTRAVENOUS | Status: DC | PRN
  Administered 2020-08-18: 150 ug/kg/min via INTRAVENOUS

## 2020-08-18 MED ORDER — SODIUM CHLORIDE 0.9 % IV SOLN: INTRAVENOUS | Status: DC

## 2020-08-18 SURGICAL SUPPLY — 14 items

## 2020-08-18 NOTE — Anesthesia Postprocedure Evaluation (Signed)
Anesthesia Post Note  Patient: Crystal Gibbs  Procedure(s) Performed: ESOPHAGOGASTRODUODENOSCOPY (EGD) WITH PROPOFOL (N/A ) BIOPSY     Patient location during evaluation: PACU Anesthesia Type: MAC Level of consciousness: awake and alert Pain management: pain level controlled Vital Signs Assessment: post-procedure vital signs reviewed and stable Respiratory status: spontaneous breathing and respiratory function stable Cardiovascular status: stable Postop Assessment: no apparent nausea or vomiting Anesthetic complications: no   No complications documented.  Last Vitals:  Vitals:   08/18/20 1100 08/18/20 1106  BP: (!) 87/71 91/73  Pulse: (!) 58 (!) 53  Resp: (!) 29 19  Temp:    SpO2: 98% 100%    Last Pain:  Vitals:   08/18/20 1106  TempSrc:   PainSc: 0-No pain                 [name] DANIEL

## 2020-08-18 NOTE — Op Note (Signed)
[facility] Patient Name: Crystal Gibbs Procedure Date: 08/18/2020 MRN: [id number] Attending MD: Gerrit Heck , MD Date of Birth: 20-Sep-1975 CSN: [id number] Age: 45 Admit Type: Outpatient Procedure:                Upper GI endoscopy Indications:              Upper abdominal pain                           45 yo with recently diagnosed hepatic                            adenocarcinoma, presents for EGD to rule out upper                            GI primary etiology. Providers:                Gerrit Heck, MD, Dulcy Fanny, Tyrone Apple, Technician, Herbie Drape, CRNA Referring MD:              Medicines:                Monitored Anesthesia Care Complications:            No immediate complications. Estimated Blood Loss:     Estimated blood loss was minimal. Procedure:                Pre-Anesthesia Assessment:                           - Prior to the procedure, a History and Physical                            was performed, and patient medications and                            allergies were reviewed. The patient's tolerance of                            previous anesthesia was also reviewed. The risks                            and benefits of the procedure and the sedation                            options and risks were discussed with the patient.                            All questions were answered, and informed consent                            was obtained. Prior Anticoagulants: The patient has                            taken no previous  anticoagulant or antiplatelet                            agents. ASA Grade Assessment: III - A patient with                            severe systemic disease. After reviewing the risks                            and benefits, the patient was deemed in                            satisfactory condition to undergo the procedure.                           After obtaining informed consent,  the endoscope was                            passed under direct vision. Throughout the                            procedure, the patient's blood pressure, pulse, and                            oxygen saturations were monitored continuously. The                            GIF-H190 ([id number] Olympus gastroscope was                            introduced through the mouth, and advanced to the                            third part of duodenum. The upper GI endoscopy was                            accomplished without difficulty. The patient                            tolerated the procedure well. Scope In: Scope Out: Findings:      The examined esophagus was normal.      The Z-line was regular and was found 35 cm from the incisors.      Mild inflammation characterized by erythema was found in the gastric       body. Biopsies were taken with a cold forceps for Helicobacter pylori       testing. Estimated blood loss was minimal.      The incisura, gastric antrum and pylorus were normal.      Localized mildly erythematous mucosa without active bleeding and with no       stigmata of bleeding was found in the duodenal bulb. Biopsies were taken       with a cold forceps for histology. Estimated blood loss was minimal.      The second portion of the duodenum and third portion of the duodenum  were normal. Impression:               - Normal esophagus.                           - Z-line regular, 35 cm from the incisors.                           - Gastritis. Biopsied.                           - Normal incisura, antrum and pylorus.                           - Erythematous duodenopathy. Biopsied.                           - Normal second portion of the duodenum and third                            portion of the duodenum.                           - No evidence of primary malignancy site noted on                            this study. Moderate Sedation:      Not Applicable - Patient had care  per Anesthesia. Recommendation:           - Patient has a contact number available for                            emergencies. The signs and symptoms of potential                            delayed complications were discussed with the                            patient. Return to normal activities tomorrow.                            Written discharge instructions were provided to the                            patient.                           - Resume previous diet.                           - Continue present medications.                           - Await pathology results.                           - Await pathology results.                           -  Return to GI clinic PRN.                           - To follow-up with [name] in the Oncology Clinic                            as planned. Procedure Code(s):        --- Professional ---                           [phone], Esophagogastroduodenoscopy, flexible,                            transoral; with biopsy, single or multiple Diagnosis Code(s):        --- Professional ---                           K29.70, Gastritis, unspecified, without bleeding                           K31.89, Other diseases of stomach and duodenum                           R10.10, Upper abdominal pain, unspecified CPT copyright 2019 [employer]. All rights reserved. The codes documented in this report are preliminary and upon coder review may  be revised to meet current compliance requirements. Gerrit Heck, MD 08/18/2020 10:50:29 AM Number of Addenda: 0

## 2020-08-18 NOTE — Interval H&P Note (Signed)
History and Physical Interval Note:  08/18/2020 9:32 AM  Crystal Gibbs  has presented today for surgery, with the diagnosis of hepatic adenocarcinoma.  She was evaluated in the Oncology clinic, and referred today for upper endoscopy to evaluate for upper GI primary site of malignancy.  The various methods of treatment have been discussed with the patient and family. After consideration of risks, benefits and other options for treatment, the patient has consented to  Procedure(s): ESOPHAGOGASTRODUODENOSCOPY (EGD) WITH PROPOFOL (N/A) as a surgical intervention.  The patient's history has been reviewed, patient examined, no change in status, stable for surgery.  I have reviewed the patient's chart and labs.  Questions were answered to the patient's satisfaction.     Dominic Pea [name]

## 2020-08-18 NOTE — Discharge Instructions (Signed)
YOU HAD AN ENDOSCOPIC PROCEDURE TODAY: Refer to the procedure report and other information in the discharge instructions given to you for any specific questions about what was found during the examination. If this information does not answer your questions, please call [place] office at [phone] to clarify.   YOU SHOULD EXPECT: Some feelings of bloating in the abdomen. Passage of more gas than usual. Walking can help get rid of the air that was put into your GI tract during the procedure and reduce the bloating. If you had a lower endoscopy (such as a colonoscopy or flexible sigmoidoscopy) you may notice spotting of blood in your stool or on the toilet paper. Some abdominal soreness may be present for a day or two, also.  DIET: Your first meal following the procedure should be a light meal and then it is ok to progress to your normal diet. A half-sandwich or bowl of soup is an example of a good first meal. Heavy or fried foods are harder to digest and may make you feel nauseous or bloated. Drink plenty of fluids but you should avoid alcoholic beverages for 24 hours. If you had a esophageal dilation, please see attached instructions for diet.    ACTIVITY: Your care partner should take you home directly after the procedure. You should plan to take it easy, moving slowly for the rest of the day. You can resume normal activity the day after the procedure however YOU SHOULD NOT DRIVE, use power tools, machinery or perform tasks that involve climbing or major physical exertion for 24 hours (because of the sedation medicines used during the test).   SYMPTOMS TO REPORT IMMEDIATELY: A gastroenterologist can be reached at any hour. Please call [phone]  for any of the following symptoms:   Following upper endoscopy (EGD, EUS, ERCP, esophageal dilation) Vomiting of blood or coffee ground material  New, significant abdominal pain  New, significant chest pain or pain under the shoulder blades  Painful or  persistently difficult swallowing  New shortness of breath  Black, tarry-looking or red, bloody stools  FOLLOW UP:  If any biopsies were taken you will be contacted by phone or by letter within the next 1-3 weeks. Call [phone]  if you have not heard about the biopsies in 3 weeks.  Please also call with any specific questions about appointments or follow up tests.  

## 2020-08-18 NOTE — Addendum Note (Signed)
Addendum  created 08/18/20 1219 by Duane Boston, MD   Clinical Note Signed

## 2020-08-18 NOTE — Anesthesia Postprocedure Evaluation (Signed)
Anesthesia Post Note  Patient: Crystal Gibbs  Procedure(s) Performed: ESOPHAGOGASTRODUODENOSCOPY (EGD) WITH PROPOFOL (N/A ) BIOPSY     Patient location during evaluation: Endoscopy Anesthesia Type: MAC Anesthetic complications: no   No complications documented.  Last Vitals:  Vitals:   08/18/20 1100 08/18/20 1106  BP: (!) 87/71 91/73  Pulse: (!) 58 (!) 53  Resp: (!) 29 19  Temp:    SpO2: 98% 100%    Last Pain:  Vitals:   08/18/20 1106  TempSrc:   PainSc: 0-No pain                 Angell Honse DANIEL

## 2020-08-18 NOTE — Progress Notes (Signed)
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 - Z-line regular, 35 cm from the incisors. - Gastritis. Biopsied. - Normal incisura, antrum and pylorus. - Erythematous duodenopathy. Biopsied. - Normal second portion of the duodenum and third portion of the duodenum. - No evidence of primary malignancy site noted on this study.   07/10/2020 Imaging   US Abdomen 07/10/20 IMPRESSION: 1.  No acute hepatobiliary findings.   2. Heterogeneous liver echotexture with 3 masslike areas as described measuring 4.3 cm, 5.4 cm and 6.4 cm respectively. Recommend CT abdomen/pelvis with intravenous contrast for further evaluation.   07/10/2020 Imaging   CT AP 07/10/20  IMPRESSION: 1. There is a large masslike collection or mass in the central liver measuring at least 10.9 x 7.1 x 10.2 cm with 2 smaller adjacent satellite lesions/collections measuring 11 and 17 mm. These findings may represent a large abscess with satellite abscesses or a large neoplasm/malignancy. Recommend MRI for further evaluation. 2. Left renal cyst. 3. Calcified atherosclerosis in the distal abdominal aorta is mild. 4. Fibroid uterus.   07/10/2020 Imaging   MRI Abdomen 07/10/20  IMPRESSION: 1. The large central  hepatic mass does not demonstrate signal characteristics or enhancement typical of a hemangioma or abscess, and is suspicious for malignancy. There is at least 1 satellite lesion centrally in the right lobe. As there are no signs of underlying cirrhosis, primary considerations include peripheral cholangiocarcinoma and metastatic disease. Tissue sampling recommended. 2. Mild intrahepatic biliary dilatation within the left hepatic lobe. Distortion of the hepatic vasculature without evidence of tumor thrombus. 3. No extrahepatic metastatic disease or primary malignancy identified within the abdomen.   07/26/2020 Imaging   CT AP 07/26/20  IMPRESSION: 1. Markedly poorly visualized and ill-defined 12 cm lesion within the liver. This lesion is better evaluated on MR abdomen 07/10/2020. 2. Otherwise no acute intra-abdominal abnormality with markedly limited evaluation on this noncontrast study. 3.  Aortic Atherosclerosis (ICD10-I70.0).   07/26/2020 Initial Diagnosis   FINAL MICROSCOPIC DIAGNOSIS: 07/26/20  A. LIVER MASS, LEFT, NEEDLE CORE BIOPSY:  - Adenocarcinoma.  - See comment.  COMMENT:  Immunohistochemistry will be performed and reported as an addendum.    07/30/2020 Imaging   CT Chest 07/30/20  IMPRESSION: 1. Pulmonary nodules are highly worrisome for metastatic disease. 2. Hepatic masses are consistent with biopsy-proven adenocarcinoma and better evaluated on MR abdomen 07/10/2020.   08/04/2020 Initial Diagnosis   Intrahepatic cholangiocarcinoma ([place])   08/04/2020 Cancer Staging   Staging form: Intrahepatic Bile Duct, AJCC 8th Edition - Clinical: Stage IB (cT1b, cN0, cM0) - Signed by [name], MD on 08/04/2020   08/13/2020 Procedure   PAC placement   08/16/2020 -  Chemotherapy   First line gemcitabine and cisplatin 2 weeks on/1 week off starting 08/16/20     08/16/2020 -  Chemotherapy   -Addition of durvalumab q3weeks with first-line chemo  starting 08/16/20    08/18/2020  Procedure   Upper Endoscopy by Dr [name] IMPRESSION - Normal esophagus. - Z-line regular, 35 cm from the incisors. - Gastritis. Biopsied. - Normal incisura, antrum and pylorus. - Erythematous duodenopathy. Biopsied. - Normal second portion of the duodenum and third portion of the duodenum. - No evidence of primary malignancy site noted on this study.    FINAL MICROSCOPIC DIAGNOSIS:   A. DUODENUM, BULB, BIOPSY:  -  Erosive and reactive duodenal mucosa with Brunner's gland hyperplasia  and mixed inflammation  -  No dysplasia or malignancy identified   B. STOMACH, BIOPSY:  -  Chronic active gastritis  -  H. pylori organisms present  -  No intestinal metaplasia identified  -  See comment   COMMENT:   A.  Dr. [name] reviewed the case and agrees with the above diagnosis.   B.  Warthin-Starry stain is POSITIVE for organisms consistent with  Helicobacter pylori.       CURRENT THERAPY:  First line gemcitabine and cisplatin 2 weeks on/1 week off  -Addition of durvalumab q3weeks   INTERVAL HISTORY:  Crystal Gibbs is here for a follow up. She presents to the clinic with husband and her interpretor. She notes her first week chemo lead to weight loss. She tolerated the first 5 days well. After day 5 she started having fatigued and muscle pain. She was able to travel to see her sister and mother, before her fatigue. She notes right shoulder radiating from her right chest and then radiates down arm. She had medicine from doctor in [place] but when she went to PCP here, was not given medication. She notes she is doing better today with eating better and feeling better. She would like to proceed with chemo this week, although she has very moderate side effects with first dose. She wonders can she use Sea moss gel. I will check with pharmacy.    REVIEW OF SYSTEMS:   Constitutional: Denies fevers, chills (+) Fatigue (+) weight loss  Eyes: Denies blurriness of vision Ears, nose, mouth,  throat, and face: Denies mucositis or sore throat Respiratory: Denies cough, dyspnea or wheezes Cardiovascular: Denies palpitation, chest discomfort or lower extremity swelling Gastrointestinal:  Denies nausea, heartburn or change in bowel habits Skin: Denies abnormal skin rashes  MSK:(+) Right chest pain tightness radiating to shoulder and down right arm.  Lymphatics: Denies new lymphadenopathy or easy bruising Neurological:Denies numbness, tingling or new weaknesses Behavioral/Psych: Mood is stable, no new changes  All other systems were reviewed with the patient and are negative.  MEDICAL HISTORY:  Past Medical History:  Diagnosis Date  . Cancer ([place])   . Fibroid   . History of multiple miscarriages    x 3  . Hypertension     SURGICAL HISTORY: Past Surgical History:  Procedure Laterality Date  . BIOPSY  08/18/2020   Procedure: BIOPSY;  Surgeon: Lavena Bullion, DO;  Location: [facility];  Service: Gastroenterology;;  . CATARACT EXTRACTION Bilateral   . ESOPHAGOGASTRODUODENOSCOPY (EGD) WITH PROPOFOL N/A 08/18/2020   Procedure: ESOPHAGOGASTRODUODENOSCOPY (EGD) WITH PROPOFOL;  Surgeon: Lavena Bullion, DO;  Location: [facility];  Service: Gastroenterology;  Laterality: N/A;  . IR IMAGING GUIDED PORT INSERTION  08/13/2020  . PARATHYROIDECTOMY Right 02/19/15    I have reviewed the social history and family history with the patient and they are unchanged from previous note.  ALLERGIES:  has No Known Allergies.  MEDICATIONS:  Current Outpatient Medications  Medication Sig Dispense Refill  .  amLODipine (NORVASC) 2.5 MG tablet Take 1 tablet (2.5 mg total) by mouth daily. 90 tablet 1  . aspirin EC 81 MG tablet Take 81 mg by mouth daily as needed (blood thinning). Swallow whole. (Patient not taking: No sig reported)    . dexamethasone (DECADRON) 4 MG tablet Take 2 tablets at breakfast for 3 days, starting the day after cisplatin (Patient taking differently: Take 8 mg by mouth  See admin instructions. Take 2 tablets at breakfast for 3 days, starting the day after cisplatin) 40 tablet 2  . doxycycline (VIBRA-TABS) 100 MG tablet Take 1 tablet (100 mg total) by mouth 2 (two) times daily for 14 days. 28 tablet 0  . ibuprofen (ADVIL) 200 MG tablet Take 400 mg by mouth every 6 (six) hours as needed for headache.    . lidocaine-prilocaine (EMLA) cream Apply to affected area once (Patient taking differently: Apply 1 application topically daily as needed (port access).) 30 g 3  . magnesium oxide (MAG-OX) 400 (240 Mg) MG tablet Take 1 tablet (400 mg total) by mouth daily. 30 tablet 0  . metroNIDAZOLE (FLAGYL) 250 MG tablet Take 1 tablet (250 mg total) by mouth 4 (four) times daily for 14 days. 56 tablet 0  . omeprazole (PRILOSEC) 20 MG capsule Take 1 capsule (20 mg total) by mouth 2 (two) times daily before a meal for 14 days. 28 capsule 0  . ondansetron (ZOFRAN ODT) 4 MG disintegrating tablet Take 1-2 tablets (4-8 mg total) by mouth every 6 (six) hours as needed for nausea or vomiting. 30 tablet 1  . ondansetron (ZOFRAN) 8 MG tablet Take 1 tablet (8 mg total) by mouth 2 (two) times daily as needed. Start on the third day after cisplatin chemotherapy. 30 tablet 1  . potassium chloride SA (KLOR-CON) 20 MEQ tablet Take 1 tablet (20 mEq total) by mouth daily. 30 tablet 0  . prochlorperazine (COMPAZINE) 10 MG tablet Take 1 tablet (10 mg total) by mouth every 6 (six) hours as needed (Nausea or vomiting). 30 tablet 1  . traMADol (ULTRAM) 50 MG tablet Take 1 tablet (50 mg total) by mouth every 6 (six) hours as needed. (Patient taking differently: Take 50 mg by mouth every 6 (six) hours as needed for moderate pain.) 30 tablet 0   Current Facility-Administered Medications  Medication Dose Route Frequency Provider Last Rate Last Admin  . 0.9 %  sodium chloride infusion   Intravenous Continuous [name], MD        PHYSICAL EXAMINATION: ECOG PERFORMANCE STATUS: 3 - Symptomatic, >50% confined  to bed  Vitals:   08/23/20 1441  BP: 126/86  Pulse: (!) 142  Resp: 19  Temp: 97.9 F (36.6 C)  SpO2: 100%   Filed Weights   08/23/20 1441  Weight: 135 lb 6.4 oz (61.4 kg)    GENERAL:alert, no distress and comfortable SKIN: skin color, texture, turgor are normal, no rashes or significant lesions EYES: normal, Conjunctiva are pink and non-injected, sclera clear  NECK: supple, thyroid normal size, non-tender, without nodularity LYMPH:  no palpable lymphadenopathy in the cervical, axillary  LUNGS: clear to auscultation and percussion with normal breathing effort HEART: regular rhythm and no murmurs and no lower extremity edema (+) Tachycardia  ABDOMEN:abdomen soft, non-tender and normal bowel sounds Musculoskeletal:no cyanosis of digits and no clubbing  NEURO: alert & oriented x 3 with fluent speech, no focal motor/sensory deficits  LABORATORY DATA:  I have reviewed the data as listed CBC Latest Ref Rng & Units 08/23/2020 08/16/2020  07/26/2020  WBC 4.0 - 10.5 K/uL 8.0 6.9 7.9  Hemoglobin 12.0 - 15.0 g/dL 12.0 13.1 13.8  Hematocrit 36.0 - 46.0 % 34.2(L) 38.8 42.2  Platelets 150 - 400 K/uL 111(L) 185 218     CMP Latest Ref Rng & Units 08/23/2020 08/16/2020 07/29/2020  Glucose 70 - 99 mg/dL 191(H) 136(H) 110(H)  BUN 6 - 20 mg/dL 10 8 9   Creatinine 0.44 - 1.00 mg/dL 0.70 0.61 0.62  Sodium 135 - 145 mmol/L 129(L) 139 136  Potassium 3.5 - 5.1 mmol/L 4.1 3.4(L) 5.3 No hemolysis seen(H)  Chloride 98 - 111 mmol/L 94(L) 101 98  CO2 22 - 32 mmol/L 26 26 31   Calcium 8.9 - 10.3 mg/dL 9.3 9.1 9.7  Total Protein 6.5 - 8.1 g/dL 7.4 7.4 8.1  Total Bilirubin 0.3 - 1.2 mg/dL 0.6 0.5 0.6  Alkaline Phos 38 - 126 U/L 191(H) 253(H) 323(H)  AST 15 - 41 U/L 25 31 45(H)  ALT 0 - 44 U/L 28 32 53(H)      RADIOGRAPHIC STUDIES: I have personally reviewed the radiological images as listed and agreed with the findings in the report. No results found.   ASSESSMENT & PLAN:  Crystal Gibbs is a 45 y.o.  female with    1. 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  -After 2 months of abdominal pain she was seen to have 12cm liver mass concerning for cancer in March when she was in [place] and again in early April scans at our local hospital.  -Her Liver biopsy from 07/26/20 showed adenocarcinoma. I discussed this type of cancer could be from liver primary or from bile duct primary called Cholangiocarcinoma. There is also possibility her primary cancer is elsewhere, although imaging did not show other malignancy outside of liver. I dicussed scans have limitations.  -Her 07/10/20 MRI abdomen also shows there is at least 1 satellite lesion centrally in the right lobe and her 07/30/20 CT Chest shows Pulmonary nodules are highly worrisome for metastatic disease. Given size of lung nodules, we can monitor for now.  -Her Upper endoscopy by Dr [name] on 08/18/20 did not show malignancy. 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 -I discussed her case in recent GI tumor Board and based on the large size and central location of her liver mass, with satellite lesion, this cancer is unresectable. Given surgery is not an option, her cancer is no longer curable, but still treatable. Systemic chemo is her main treatable to control her disease and prolong her life.  -I started her on chemo with First line gemcitabine and cisplatin 2 weeks on/1 week off in addition to  durvalumab q3weeks based on recent TOPAZ1 trial data. She started on 08/16/20. The goal of chemotherapy is palliative, if surgery is not an option -She tolerated first dose overall moderately to poorly with significant fatigue starting around day 5. She has also lost 7lbs in a week. She has been able to recover better today.  -Today she is tachycardic. Her EKG today  showed sinus tachy which  Is likely from dehydration. I will give IV Fluids today. I discussed skipping day 8 of C1 and proceeding with C2 next week. She is agreeable.  -f/u next week.    2. Abdominal Pain, secondary to #1 -She has had abdominal pain for the last 2 months. The pain is not  daily (mostly with eating and prolonged sitting) but discomfort is mostly daily.  -Pain started while she was in [place] and Work up was concerning for malignancy. She chose to return to the [place] for more workup.  -She is on Tramadol for pain. I encouraged her to watch for constipation on pain medication.   3. Weight Loss, secondary to #1 -She initially was on weight loss pill RE-Eshape for her IVF. She stopped 3 months ago.  -She still has lost 22 kg recently. I recommend she no longer take any weight loss pill and work on maintaining and gaining weight given her cancer diagnosis.  -She did lose more weight with start of chemo. She is tachycardic which is a sign of dehydration. I will given IV fluids today (08/23/20).  -I encouraged her to increase protein and calorie intake. Continue to f/u with dietician.    4. Social and Acupuncturist  -She lives in [place] with her husband, although she was recently frequently in [place] for. She is a [place] Citizen.  -She and her husband does not speak much [place]. They require interpretor, one of who they listed as a point of contact.   -She is not working. He has job, but not currently working, as to help his wife.  -She has [employer] health insurance. She does not have a PCP currently in the [place].  -Continue f/u with SW and financial advocate for support and to possibly apply for Medicare/Medicaid.   5. Genetic testing  -Given her age and family history of GI cancer with her mother, she is eligible for genetic testing. She was previously referred.   6. H. Pylori  -She was diagnosed on 08/18/20 Upper Endoscopy with Dr [name]. She is being treated with Flagyl,  Prilosec and doxycycline. She is not tolerating Flagyl well. I will message Dr [name] about switching it.    7. Right chest tightness and right arm pain  -She notes right shoulder radiating from her right chest tightness and then radiates down arm. -She has Tramadol for her pain, she can continue or Tylenol/Ibuprofen.    PLAN:  -No chemo today  -Will give IV Fluids today  -Lab, flush, F/u and chemo Cis/Gem in 1 and 2 week  -Copy note to Dr [name] about Flagyl.    No problem-specific Assessment & Plan notes found for this encounter.   No orders of the defined types were placed in this encounter.  All questions were answered. The patient knows to call the clinic with any problems, questions or concerns. No barriers to learning was detected. The total time spent in the appointment was 30 minutes.     [name], MD 08/23/2020   I, Joslyn Devon, am acting as scribe for [name], MD.   I have reviewed the above documentation for accuracy and completeness, and I agree with the above.

## 2020-08-18 NOTE — Transfer of Care (Signed)
Immediate Anesthesia Transfer of Care Note  Patient: Crystal Gibbs  Procedure(s) Performed: ESOPHAGOGASTRODUODENOSCOPY (EGD) WITH PROPOFOL (N/A ) BIOPSY  Patient Location: PACU  Anesthesia Type:MAC  Level of Consciousness: sedated, patient cooperative and responds to stimulation  Airway & Oxygen Therapy: Patient Spontanous Breathing and Patient connected to face mask oxygen  Post-op Assessment: Report given to RN and Post -op Vital signs reviewed and stable  Post vital signs: Reviewed and stable  Last Vitals:  Vitals Value Taken Time  BP    Temp    Pulse 105 08/18/20 1049  Resp 19 08/18/20 1049  SpO2 100 % 08/18/20 1049  Vitals shown include unvalidated device data.  Last Pain:  Vitals:   08/18/20 0947  TempSrc: Oral  PainSc: 0-No pain         Complications: No complications documented.

## 2020-08-18 NOTE — Telephone Encounter (Signed)
Attempted to call patient using [employer] ID# [phone].  US Pelvic Complete with Transvaginal 08/25/2020 1330 appointment with a [address] [place], [place] [address] [phone]   Patient is to Drink 32 oz water and arrive with a full bladder. No voiding 1 hour prior to exam There is a $75 no show fee if appointment is not cancelled with 24 hours   There was no answer and no voice mail was left.  Will try to call back again.  Ozella Almond, [place]

## 2020-08-19 ENCOUNTER — Inpatient Hospital Stay: Payer: 59

## 2020-08-19 ENCOUNTER — Inpatient Hospital Stay: Payer: 59 | Admitting: Nutrition

## 2020-08-19 ENCOUNTER — Encounter: Payer: Self-pay | Admitting: General Practice

## 2020-08-19 ENCOUNTER — Telehealth: Payer: Self-pay

## 2020-08-19 ENCOUNTER — Inpatient Hospital Stay (HOSPITAL_BASED_OUTPATIENT_CLINIC_OR_DEPARTMENT_OTHER): Payer: 59 | Admitting: Genetic Counselor

## 2020-08-19 ENCOUNTER — Other Ambulatory Visit: Payer: Self-pay | Admitting: Genetic Counselor

## 2020-08-19 DIAGNOSIS — C221 Intrahepatic bile duct carcinoma: Secondary | ICD-10-CM

## 2020-08-19 DIAGNOSIS — Z803 Family history of malignant neoplasm of breast: Secondary | ICD-10-CM | POA: Diagnosis not present

## 2020-08-19 DIAGNOSIS — Z800 Family history of malignant neoplasm of digestive organs: Secondary | ICD-10-CM | POA: Diagnosis not present

## 2020-08-19 DIAGNOSIS — N939 Abnormal uterine and vaginal bleeding, unspecified: Secondary | ICD-10-CM | POA: Insufficient documentation

## 2020-08-19 DIAGNOSIS — R030 Elevated blood-pressure reading, without diagnosis of hypertension: Secondary | ICD-10-CM | POA: Insufficient documentation

## 2020-08-19 DIAGNOSIS — Z8042 Family history of malignant neoplasm of prostate: Secondary | ICD-10-CM | POA: Diagnosis not present

## 2020-08-19 LAB — GENETIC SCREENING ORDER

## 2020-08-19 LAB — CYTOLOGY - PAP
Comment: NEGATIVE
Diagnosis: NEGATIVE
High risk HPV: NEGATIVE

## 2020-08-19 NOTE — Assessment & Plan Note (Signed)
Patient with hx of uterine fibroid, also had recent miscarriage while undergoing IVF treatments. Has had two normal menses since SAB, however reports bleeding for 40 days.  - will start with transvaginal ultrasound to look for structural causes of bleeding  - recent CBC with normal Hgb so low concern for anemia due to bleeding

## 2020-08-19 NOTE — Progress Notes (Addendum)
[place] CSW Progress Notes  Request from Red Christians, Estate manager/land agent, to clarify status of her Medicaid/disability application referral from [facility].    CSW clarified that [facility] only assists w applications for Social Security disability, not Medicaid.  Medicaid applications need to be done thruogh Department of Social Services.    Spoke w Arvella Nigh at [facility] - they scheduled appointment with husband who was to serve as an interpreter as patient does not speak [place].  [facility] requests that family/friend assist w translation needed in cases where patients do not speak English and cannot assist w their application process.  Appointment was clarified w patient and husband prior to scheduled appointment at 9 AM on May 11.  When [facility] called, there was no answer at number provided.  [place] is awaiting a call back from patient/family to clarify if they would like help in applying for Social Security disability  CSW spoke w patient's husband and clarified that [place]icaid applications must go through [place].  CSW made referral to [facility] so family can understand this process and be assisted if possible.  Also clarified w husband that [facility] had a scheduled appointment yesterday w him/patient - advised him to call and reschedule if desired.  Provided [facility] phone number.  Edwyna Shell, LCSW Clinical Social Worker Phone:  [phone]

## 2020-08-19 NOTE — Assessment & Plan Note (Signed)
Pap smear completed today. 

## 2020-08-19 NOTE — Assessment & Plan Note (Signed)
Patient currently taking 5mg  amlodpine daily, previously on nifedipine daily prior to moving to [place]. Patient with lower BP today. Advised to stop nifedipine. Continue amlodipine 2.5mg  daily. Advised to monitor for symptoms of hypotension including orthostatic changes, dizziness, lightheadedness. Patient and niece voiced understanding.

## 2020-08-19 NOTE — Progress Notes (Signed)
45 year old female diagnosed with cholangiocarcinoma followed by [name].  She will receive cisplatin and gemcitabine q. 21 days.  Past medical history includes acid reflux and hypertension.  Medications include Zofran.  Labs include potassium 3.4, glucose 136, magnesium 1.6, and albumin 3.3 on May 9.  Height: 5 feet 4 inches. Weight: 142.8 pounds on May 10. Patient weighed 154 pounds April 18 and 171 pounds in July 2020. BMI: 24.51.  I met with patient, her husband, and interpreter. Patient initial weight loss resulted from a weight loss medication and diet. She reports she enjoys spicy foods and has many questions regarding what food she is able to eat. She tends to have constipation.  She wants to know if she can drink Ensure Plus.  Nutrition Diagnosis: Food and Nutrition Related Knowledge Deficit related to new dx of liver cancer and associated treatments as evidenced by no previous need for diet information  Intervention: Educated to consume small frequent meals and snacks. Encouraged high protein foods. Enjoy foods as tolerated but discontinue if they are aggravating nausea or reflux. Ensure Plus BID. Weight maintenance. Increase fluids to minimum 2 L daily. Encourage bowel regimen.  Monitoring, Evaluation, Goals: Tolerate calories and protein to support weight maintenance.  Next Visit: Tuesday, June 7 during infusion.  **Disclaimer: This note was dictated with voice recognition software. Similar sounding words can inadvertently be transcribed and this note may contain transcription errors which may not have been corrected upon publication of note.**

## 2020-08-19 NOTE — Progress Notes (Signed)
Connected with the patient, her husband and her interpreter in the lobby of [facility] after her nutrition appointment.  The patient had questions regarding her EGD results from yesterday.  I explained to her through her interpreter that there were a couple of areas that Dr. Cathleen Corti took biopsies from however those results will most likely not be back until beginning of next week.  She is set up to see [name] on Monday 5/16.  The patient and her husband verbalized an understanding.

## 2020-08-19 NOTE — Telephone Encounter (Signed)
Left message with patient's husband, Sabino Gasser. Patient was present during call but husband spoke. Patient also has [place].  [facility] ID# [phone] was used during call.  Ozella Almond, [place]

## 2020-08-20 ENCOUNTER — Encounter: Payer: Self-pay | Admitting: Family Medicine

## 2020-08-20 ENCOUNTER — Encounter: Payer: Self-pay | Admitting: Hematology

## 2020-08-20 ENCOUNTER — Encounter: Payer: Self-pay | Admitting: General Practice

## 2020-08-20 ENCOUNTER — Telehealth: Payer: Self-pay | Admitting: General Surgery

## 2020-08-20 LAB — SURGICAL PATHOLOGY

## 2020-08-20 MED ORDER — DOXYCYCLINE HYCLATE 100 MG PO TABS: 100.0000 mg | ORAL_TABLET | Freq: Two times a day (BID) | ORAL | 0 refills | Status: AC

## 2020-08-20 MED ORDER — METRONIDAZOLE 250 MG PO TABS: 250.0000 mg | ORAL_TABLET | Freq: Four times a day (QID) | ORAL | 0 refills | Status: AC

## 2020-08-20 MED ORDER — OMEPRAZOLE 20 MG PO CPDR: 20.0000 mg | DELAYED_RELEASE_CAPSULE | Freq: Two times a day (BID) | ORAL | 0 refills | Status: DC

## 2020-08-20 NOTE — Telephone Encounter (Signed)
-----   Message from [place], DO sent at 08/20/2020  7:48 AM EDT ----- The biopsies from the recent upper GI Endoscopy were notable for H. Pylori gastritis, and will plan on treating with quad therapy as below. Please confirm no medication allergies to the prescribed regimen.   1) Omeprazole 20 mg 2 times a day x 14 d 2) Pepto Bismol 2 tabs (262 mg each) 4 times a day x 14 d 3) Metronidazole 250 mg 4 times a day x 14 d 4) doxycycline 100 mg 2 times a day x 14 d  After 14 days, ok to stop omeprazole.  4 weeks after treatment completed, check H. Pylori stool antigen to confirm eradication (must be off acid suppression therapy)  Dx: H. Pylori gastritis

## 2020-08-20 NOTE — Progress Notes (Signed)
Enrolled patient in copay program for [employer] through [employer].  Patient approved for up to $26,000 leaving most patients with a $0 out of pocket after insurance pays.  A copy of approval letter will be given to patient at next visit Mon 5/16 for her records only.  A copy will be given to [facility] for billing/copay claim submissions via portal.

## 2020-08-20 NOTE — Progress Notes (Signed)
[place] CSW Progress Notes  Per [facility] email, patient has scheduled an in person interview for disability application assistance w this agency on in-person interview, Thursday, May 19th at 11am.  Patient and family are aware of this appointment.  Edwyna Shell, LCSW Clinical Social Worker Phone:  [phone]

## 2020-08-20 NOTE — Telephone Encounter (Signed)
-----   Message from Vito Cirigliano V, DO sent at 08/20/2020  7:48 AM EDT ----- The biopsies from the recent upper GI Endoscopy were notable for H. Pylori gastritis, and will plan on treating with quad therapy as below. Please confirm no medication allergies to the prescribed regimen.   1) Omeprazole 20 mg 2 times a day x 14 d 2) Pepto Bismol 2 tabs (262 mg each) 4 times a day x 14 d 3) Metronidazole 250 mg 4 times a day x 14 d 4) doxycycline 100 mg 2 times a day x 14 d  After 14 days, ok to stop omeprazole.  4 weeks after treatment completed, check H. Pylori stool antigen to confirm eradication (must be off acid suppression therapy)  Dx: H. Pylori gastritis   

## 2020-08-20 NOTE — Telephone Encounter (Signed)
Sent message via mychart regarding h.pylori and medication regime.

## 2020-08-23 ENCOUNTER — Inpatient Hospital Stay: Payer: 59

## 2020-08-23 ENCOUNTER — Other Ambulatory Visit: Payer: Self-pay

## 2020-08-23 ENCOUNTER — Telehealth: Payer: Self-pay | Admitting: Hematology

## 2020-08-23 ENCOUNTER — Encounter: Payer: Self-pay | Admitting: Genetic Counselor

## 2020-08-23 ENCOUNTER — Inpatient Hospital Stay (HOSPITAL_BASED_OUTPATIENT_CLINIC_OR_DEPARTMENT_OTHER): Payer: 59 | Admitting: Hematology

## 2020-08-23 VITALS — BP 133/70 | HR 92

## 2020-08-23 VITALS — BP 126/86 | HR 142 | Temp 97.9°F | Resp 19 | Wt 135.4 lb

## 2020-08-23 DIAGNOSIS — Z8042 Family history of malignant neoplasm of prostate: Secondary | ICD-10-CM

## 2020-08-23 DIAGNOSIS — E860 Dehydration: Secondary | ICD-10-CM

## 2020-08-23 DIAGNOSIS — C221 Intrahepatic bile duct carcinoma: Secondary | ICD-10-CM | POA: Diagnosis not present

## 2020-08-23 DIAGNOSIS — Z803 Family history of malignant neoplasm of breast: Secondary | ICD-10-CM

## 2020-08-23 DIAGNOSIS — Z95828 Presence of other vascular implants and grafts: Secondary | ICD-10-CM

## 2020-08-23 DIAGNOSIS — Z800 Family history of malignant neoplasm of digestive organs: Secondary | ICD-10-CM

## 2020-08-23 DIAGNOSIS — Z5111 Encounter for antineoplastic chemotherapy: Secondary | ICD-10-CM | POA: Diagnosis not present

## 2020-08-23 HISTORY — DX: Family history of malignant neoplasm of prostate: Z80.42

## 2020-08-23 HISTORY — DX: Family history of malignant neoplasm of digestive organs: Z80.0

## 2020-08-23 HISTORY — DX: Family history of malignant neoplasm of breast: Z80.3

## 2020-08-23 LAB — CBC WITH DIFFERENTIAL (CANCER CENTER ONLY)
Abs Immature Granulocytes: 0.06 10*3/uL (ref 0.00–0.07)
Basophils Absolute: 0 10*3/uL (ref 0.0–0.1)
Basophils Relative: 0 %
Eosinophils Absolute: 0 10*3/uL (ref 0.0–0.5)
Eosinophils Relative: 0 %
HCT: 34.2 % — ABNORMAL LOW (ref 36.0–46.0)
Hemoglobin: 12 g/dL (ref 12.0–15.0)
Immature Granulocytes: 1 %
Lymphocytes Relative: 22 %
Lymphs Abs: 1.8 10*3/uL (ref 0.7–4.0)
MCH: 28.7 pg (ref 26.0–34.0)
MCHC: 35.1 g/dL (ref 30.0–36.0)
MCV: 81.8 fL (ref 80.0–100.0)
Monocytes Absolute: 0.4 10*3/uL (ref 0.1–1.0)
Monocytes Relative: 5 %
Neutro Abs: 5.7 10*3/uL (ref 1.7–7.7)
Neutrophils Relative %: 72 %
Platelet Count: 111 10*3/uL — ABNORMAL LOW (ref 150–400)
RBC: 4.18 MIL/uL (ref 3.87–5.11)
RDW: 12.9 % (ref 11.5–15.5)
WBC Count: 8 10*3/uL (ref 4.0–10.5)
nRBC: 0 % (ref 0.0–0.2)

## 2020-08-23 LAB — CMP (CANCER CENTER ONLY)
ALT: 28 U/L (ref 0–44)
AST: 25 U/L (ref 15–41)
Albumin: 2.9 g/dL — ABNORMAL LOW (ref 3.5–5.0)
Alkaline Phosphatase: 191 U/L — ABNORMAL HIGH (ref 38–126)
Anion gap: 9 (ref 5–15)
BUN: 10 mg/dL (ref 6–20)
CO2: 26 mmol/L (ref 22–32)
Calcium: 9.3 mg/dL (ref 8.9–10.3)
Chloride: 94 mmol/L — ABNORMAL LOW (ref 98–111)
Creatinine: 0.7 mg/dL (ref 0.44–1.00)
GFR, Estimated: >60 mL/min (ref >60)
Glucose, Bld: 191 mg/dL — ABNORMAL HIGH (ref 70–99)
Potassium: 4.1 mmol/L (ref 3.5–5.1)
Sodium: 129 mmol/L — ABNORMAL LOW (ref 135–145)
Total Bilirubin: 0.6 mg/dL (ref 0.3–1.2)
Total Protein: 7.4 g/dL (ref 6.5–8.1)

## 2020-08-23 LAB — TSH: TSH: 0.938 u[IU]/mL (ref 0.308–3.960)

## 2020-08-23 LAB — MAGNESIUM: Magnesium: 1.7 mg/dL (ref 1.7–2.4)

## 2020-08-23 LAB — PREGNANCY, URINE: Preg Test, Ur: NEGATIVE

## 2020-08-23 MED ORDER — SODIUM CHLORIDE 0.9% FLUSH
10.0000 mL | Freq: Once | INTRAVENOUS | Status: AC
  Administered 2020-08-23: 10 mL
  Filled 2020-08-23: qty 10

## 2020-08-23 MED ORDER — SODIUM CHLORIDE 0.9 % IV SOLN
Freq: Once | INTRAVENOUS | Status: AC
  Filled 2020-08-23: qty 250

## 2020-08-23 MED ORDER — SODIUM CHLORIDE 0.9 % IV SOLN
INTRAVENOUS | Status: DC
  Filled 2020-08-23: qty 250

## 2020-08-23 MED FILL — Fosaprepitant Dimeglumine For IV Infusion 150 MG (Base Eq): INTRAVENOUS | Qty: 5 | Status: AC

## 2020-08-23 MED FILL — Dexamethasone Sodium Phosphate Inj 100 MG/10ML: INTRAMUSCULAR | Qty: 1 | Status: AC

## 2020-08-23 NOTE — Progress Notes (Signed)
REFERRING PROVIDER: [name], MD [place],  [place] [address]  PRIMARY PROVIDER:  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 Family history of breast cancer   3. Family history of pancreatic cancer   4. Family history of prostate cancer   5. Family history of stomach cancer     HISTORY OF PRESENT ILLNESS:   Crystal Gibbs, a 45 y.o. female, was seen for a [place] cancer genetics consultation at the request of [name] due to a personal and family history of cancer.  Crystal Gibbs presents to clinic today with her husband and an interpreter Florestine Avers) to discuss the possibility of a hereditary predisposition to cancer, to discuss genetic testing, and to further clarify her future cancer risks, as well as potential cancer risks for family members.   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 The treatment plan includes chemotherapy.   CANCER HISTORY:  Oncology History Overview Note  Cancer Staging Intrahepatic cholangiocarcinoma ([place]) Staging form: Intrahepatic Bile Duct, AJCC 8th Edition - Clinical: Stage IB (cT1b, cN0, cM0) - Signed by [name], MD on 08/04/2020    Intrahepatic cholangiocarcinoma ([place])  08/19/2019 Procedure   Upper Endoscopy by Dr [name]  IMPRESSION - Normal esophagus. - Z-line regular, 35 cm from the incisors. - Gastritis. Biopsied. - Normal incisura, antrum and pylorus. - Erythematous duodenopathy. Biopsied. - Normal second portion of the duodenum and third portion of the duodenum. - No evidence of primary malignancy site noted on this study.   07/10/2020 Imaging   US Abdomen 07/10/20 IMPRESSION: 1.  No acute hepatobiliary findings.   2. Heterogeneous liver echotexture with 3 masslike areas as described measuring 4.3 cm, 5.4 cm and 6.4 cm respectively. Recommend CT abdomen/pelvis with intravenous contrast for  further evaluation.   07/10/2020 Imaging   CT AP 07/10/20  IMPRESSION: 1. There is a large masslike collection or mass in the central liver measuring at least 10.9 x 7.1 x 10.2 cm with 2 smaller adjacent satellite lesions/collections measuring 11 and 17 mm. These findings may represent a large abscess with satellite abscesses or a large neoplasm/malignancy. Recommend MRI for further evaluation. 2. Left renal cyst. 3. Calcified atherosclerosis in the distal abdominal aorta is mild. 4. Fibroid uterus.   07/10/2020 Imaging   MRI Abdomen 07/10/20  IMPRESSION: 1. The large central hepatic mass does not demonstrate signal characteristics or enhancement typical of a hemangioma or abscess, and is suspicious for malignancy. There is at least 1 satellite lesion centrally in the right lobe. As there are no signs of underlying cirrhosis, primary considerations include peripheral cholangiocarcinoma and metastatic disease. Tissue sampling recommended. 2. Mild intrahepatic biliary dilatation within the left hepatic lobe. Distortion of the hepatic vasculature without evidence of tumor thrombus. 3. No extrahepatic metastatic disease or primary malignancy identified within the abdomen.   07/26/2020 Imaging   CT AP 07/26/20  IMPRESSION: 1. Markedly poorly visualized and ill-defined 12 cm lesion within the liver. This lesion is better evaluated on MR abdomen 07/10/2020. 2. Otherwise no acute intra-abdominal abnormality with markedly limited evaluation on this noncontrast study. 3.  Aortic Atherosclerosis (ICD10-I70.0).   07/26/2020 Initial Diagnosis   FINAL MICROSCOPIC DIAGNOSIS: 07/26/20  A. LIVER MASS, LEFT, NEEDLE CORE BIOPSY:  - Adenocarcinoma.  - See comment.  COMMENT:  Immunohistochemistry will be performed and reported as an addendum.    07/30/2020 Imaging   CT Chest 07/30/20  IMPRESSION: 1. Pulmonary nodules are highly worrisome for metastatic disease. 2. Hepatic masses are consistent with  biopsy-proven adenocarcinoma and better evaluated on MR abdomen 07/10/2020.   08/04/2020 Initial Diagnosis   Intrahepatic cholangiocarcinoma ([place])   08/04/2020 Cancer Staging   Staging form: Intrahepatic Bile Duct, AJCC 8th Edition - Clinical: Stage IB (cT1b, cN0, cM0) - Signed by [name], MD on 08/04/2020   08/13/2020 Procedure   PAC placement   08/16/2020 -  Chemotherapy   First line gemcitabine and cisplatin 2 weeks on/1 week off starting 08/16/20     08/16/2020 -  Chemotherapy   -Addition of durvalumab q3weeks with first-line chemo starting 08/16/20    08/18/2020 Procedure   Upper Endoscopy by Dr [name] IMPRESSION - Normal esophagus. - Z-line regular, 35 cm from the incisors. - Gastritis. Biopsied. - Normal incisura, antrum and pylorus. - Erythematous duodenopathy. Biopsied. - Normal second portion of the duodenum and third portion of the duodenum. - No evidence of primary malignancy site noted on this study.    FINAL MICROSCOPIC DIAGNOSIS:   A. DUODENUM, BULB, BIOPSY:  -  Erosive and reactive duodenal mucosa with Brunner's gland hyperplasia  and mixed inflammation  -  No dysplasia or malignancy identified   B. STOMACH, BIOPSY:  -  Chronic active gastritis  -  H. pylori organisms present  -  No intestinal metaplasia identified  -  See comment   COMMENT:   A.  Dr. [name] reviewed the case and agrees with the above diagnosis.   B.  Warthin-Starry stain is POSITIVE for organisms consistent with  Helicobacter pylori.      RISK FACTORS:  Menarche was at age ~17.  Nulliparous.  Ovaries intact: yes.  Hysterectomy: no.  Menopausal status: premenopausal.  HRT use: 0 years.  Use of fertility hormones.  Colonoscopy: no; not examined. Mammogram within the last year: yes. Up to date with pelvic exams: yes.   Past Medical History:  Diagnosis Date  . Cancer ([place])   . Family history of breast cancer 08/23/2020  . Family history of pancreatic cancer 08/23/2020  .  Family history of prostate cancer 08/23/2020  . Family history of stomach cancer 08/23/2020  . Fibroid   . History of multiple miscarriages    x 3  . Hypertension     Past Surgical History:  Procedure Laterality Date  . BIOPSY  08/18/2020   Procedure: BIOPSY;  Surgeon: Lavena Bullion, DO;  Location: [facility];  Service: Gastroenterology;;  . CATARACT EXTRACTION Bilateral   . ESOPHAGOGASTRODUODENOSCOPY (EGD) WITH PROPOFOL N/A 08/18/2020   Procedure: ESOPHAGOGASTRODUODENOSCOPY (EGD) WITH PROPOFOL;  Surgeon: Lavena Bullion, DO;  Location: [facility];  Service: Gastroenterology;  Laterality: N/A;  . IR IMAGING GUIDED PORT INSERTION  08/13/2020  . PARATHYROIDECTOMY Right 02/19/15    Social History   Socioeconomic History  . Marital status: Married    Spouse name: Not on file  . Number of children: 0  . Years of education: Not on file  . Highest education level: Not on file  Occupational History  . Occupation: unemployed  Tobacco Use  . Smoking status: Never Smoker  . Smokeless tobacco: Never Used  Vaping Use  . Vaping Use: Never used  Substance and Sexual Activity  . Alcohol use: No  . Drug use: No  . Sexual activity: Yes  Other Topics Concern  . Not on file  Social History Narrative  . Not on file   Social Determinants of Health   Financial Resource Strain: Not  on file  Food Insecurity: Not on file  Transportation Needs: Not on file  Physical Activity: Not on file  Stress: Not on file  Social Connections: Not on file     FAMILY HISTORY:  We obtained a detailed, 4-generation family history.  Significant diagnoses are listed below: Family History  Problem Relation Age of Onset  . Pancreatic cancer Mother 42  . Diabetes Father   . Liver cancer Father 80  . Diabetes Sister   . Breast cancer Sister 64  . Prostate cancer Paternal Uncle        dx unknown age  . Stomach cancer Maternal Grandfather        dx 70s-80s  . Prostate cancer Cousin        dx 69s?   . Prostate cancer Paternal Uncle        dx unknown age; mets    Crystal Gibbs is unaware of previous family history of genetic testing for hereditary cancer risks. Patient's maternal ancestors are of [place] descent, and paternal ancestors are of [place] descent. There is no reported Ashkenazi Jewish ancestry. There is no known consanguinity.  GENETIC COUNSELING ASSESSMENT: Crystal Gibbs is a 45 y.o. female with a personal and family history of cancer which is somewhat suggestive of a hereditary cancer syndrome, such as Lynch syndrome and/or Hereditary Breast and Ovarian Cancer Syndrome and predisposition to cancer given her diagnosis before the age of 54 and the presence of related cancers in the family. We, therefore, discussed and recommended the following at today's visit.   DISCUSSION: We discussed that 5 - 10% of cancer is hereditary, with most cases of hereditary bile duct cancer are associated with mutations in the Lynch syndrome genes.  There are other genes that can be associated with hereditary pancreatic, prostate, and breast cancer syndromes.  Type of cancer risk and level or risk are gene-specific.  We discussed that testing is beneficial for several reasons including knowing how to follow individuals for their cancer risks, identifying whether potential treatment options would be beneficial, and understanding if other family members could be at risk for cancer and allowing them to undergo genetic testing.   We reviewed the characteristics, features and inheritance patterns of hereditary cancer syndromes. We also discussed genetic testing, including the appropriate family members to test, the process of testing, insurance coverage and turn-around-time for results. We discussed the implications of a negative, positive, carrier and/or variant of uncertain significant result. We recommended Crystal Gibbs pursue genetic testing for a panel that includes genes associated with bile duct, pancreas, breast,  and prostate cancers.   The Multi-Cancer Panel with pancreatitis genes offered by [facility] includes sequencing and/or deletion duplication testing of the following 89 genes: AIP, ALK, APC, ATM, AXIN2,BAP1,  BARD1, BLM, BMPR1A, BRCA1, BRCA2, BRIP1, CASR, CDC73, CDH1, CDK4, CDKN1B, CDKN1C, CDKN2A (p14ARF), CDKN2A (p16INK4a), CEBPA, CFTR, CHEK2, CPA1, CTNNA1, CTRC, DICER1, DIS3L2, EGFR (c.2369C>T, p.Thr790Met variant only), EPCAM (Deletion/duplication testing only), FH, FLCN, GATA2, GPC3, GREM1 (Promoter region deletion/duplication testing only), HOXB13 (c.251G>A, p.Gly84Glu), HRAS, KIT, MAX, MEN1, MET, MITF (c.952G>A, p.Glu318Lys variant only), MLH1, MSH2, MSH3, MSH6, MUTYH, NBN, NF1, NF2, NTHL1, PALB2, PDGFRA, PHOX2B, PMS2, POLD1, POLE, POT1, PRKAR1A, PRSS1, PTCH1, PTEN, RAD50, RAD51C, RAD51D, RB1, RECQL4, RET, RNF43, RUNX1, SDHAF2, SDHA (sequence changes only), SDHB, SDHC, SDHD, SMAD4, SMARCA4, SMARCB1, SMARCE1, SPINK1, STK11, SUFU, TERC, TERT, TMEM127, TP53, TSC1, TSC2, VHL, WRN and WT1.   Based on Crystal Gibbs's personal and family history of cancer, she meets medical criteria for genetic testing. Despite  that she meets criteria, she may still have an out of pocket cost. We discussed that if she has an out of pocket cost for testing, the laboratory should reach out to discuss self-pay options or patient pay assistance programs.   PLAN: After considering the risks, benefits, and limitations, Crystal Gibbs provided informed consent to pursue genetic testing and the blood sample was sent to [facility] for analysis of the Multi-Cancer Panel. Results should be available within approximately 3 weeks' time, at which point they will be disclosed by telephone to Crystal Gibbs, as will any additional recommendations warranted by these results. Crystal Gibbs will receive a summary of her genetic counseling visit and a copy of her results once available. This information will also be available in Epic.   Lastly, we  encouraged Crystal Gibbs to remain in contact with cancer genetics annually so that we can continuously update the family history and inform her of any changes in cancer genetics and testing that may be of benefit for this family.   Crystal Gibbs questions were answered to her satisfaction today. Our contact information was provided should additional questions or concerns arise. Thank you for the referral and allowing [place] to share in the care of your patient.   Sakshi Sermons M. Joette Catching, [place], [facility] Genetic Counselor [email or web address] (P) [phone]  The patient was seen for a total of 40 minutes in face-to-face genetic counseling.  Drs. Magrinat, Lindi Adie and/or Burr Medico were available to discuss this case as needed.    _______________________________________________________________________ For Office Staff:  Number of people involved in session: 1 Was an Intern/ student involved with case: no

## 2020-08-23 NOTE — Telephone Encounter (Signed)
Scheduled follow-up appointments per 5/9 los. Patient's husband is aware.

## 2020-08-24 ENCOUNTER — Inpatient Hospital Stay: Payer: 59

## 2020-08-24 ENCOUNTER — Encounter: Payer: Self-pay | Admitting: Hematology

## 2020-08-24 LAB — CANCER ANTIGEN 19-9: CA 19-9: 43 U/mL — ABNORMAL HIGH (ref 0–35)

## 2020-08-24 LAB — T4: T4, Total: 7.7 ug/dL (ref 4.5–12.0)

## 2020-08-25 ENCOUNTER — Encounter: Payer: Self-pay | Admitting: General Practice

## 2020-08-25 ENCOUNTER — Inpatient Hospital Stay: Admission: RE | Admit: 2020-08-25 | Payer: 59 | Source: Ambulatory Visit

## 2020-08-25 NOTE — Progress Notes (Signed)
[place] CSW Progress Notes  Call from patient [phone]), called back w Arabic interpreter - [employer] [phone].    Patient wants help in applying for Food Stamps, Medicaid and disability.  Informed her of appointment with [facility] on Thurs May 19 at 11 AM at this agency, provided address.  She states that she can get to this appointment and will attend.  Advised that she will need to apply for Medicaid and Food Stamps at [facility] ([address] in [place]). She is concerned that she would have to wait a long time at [place].  She wonders if she can get an appointment there, CSW advised that she needs to contact DSS w this question.  She has not heard from [facility] [employer].  She arrived in [place] from [place] in 2011.   CSW will follow up w this agency and ask them to contact her.  She would like to them to call her husband's number [phone]).  VM left for [facility] requesting an update.  Edwyna Shell, LCSW Clinical Social Worker Phone:  [phone]

## 2020-08-26 ENCOUNTER — Encounter: Payer: Self-pay | Admitting: General Practice

## 2020-08-26 NOTE — Progress Notes (Signed)
Crystal Gibbs CSW Progress Notes  Secure email from [employer], as follows:" A Museum/gallery conservator from my team called Crystal Gibbs's number and talked to her husband. [name] applied to medicaid two weeks ago and the family is planning to follow up on the application at the [place] office. The CHW explained the importance of having the required documentation during their follow up visit to the [place] office."    Edwyna Shell, [place] Worker Phone:  [phone]

## 2020-08-27 ENCOUNTER — Telehealth: Payer: Self-pay | Admitting: Hematology

## 2020-08-27 ENCOUNTER — Telehealth: Payer: Self-pay | Admitting: General Surgery

## 2020-08-27 NOTE — Progress Notes (Signed)
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 - Z-line regular, 35 cm from the incisors. - Gastritis. Biopsied. - Normal incisura, antrum and pylorus. - Erythematous duodenopathy. Biopsied. - Normal second portion of the duodenum and third portion of the duodenum. - No evidence of primary malignancy site noted on this study.   07/10/2020 Imaging   US Abdomen 07/10/20 IMPRESSION: 1.  No acute hepatobiliary findings.   2. Heterogeneous liver echotexture with 3 masslike areas as described measuring 4.3 cm, 5.4 cm and 6.4 cm respectively. Recommend CT abdomen/pelvis with intravenous contrast for further evaluation.   07/10/2020 Imaging   CT AP 07/10/20  IMPRESSION: 1. There is a large masslike collection or mass in the central liver measuring at least 10.9 x 7.1 x 10.2 cm with 2 smaller adjacent satellite lesions/collections measuring 11 and 17 mm. These findings may represent a large abscess with satellite abscesses or a large neoplasm/malignancy. Recommend MRI for further evaluation. 2. Left renal cyst. 3. Calcified atherosclerosis in the distal abdominal aorta is mild. 4. Fibroid uterus.   07/10/2020 Imaging   MRI Abdomen 07/10/20  IMPRESSION: 1. The large central  hepatic mass does not demonstrate signal characteristics or enhancement typical of a hemangioma or abscess, and is suspicious for malignancy. There is at least 1 satellite lesion centrally in the right lobe. As there are no signs of underlying cirrhosis, primary considerations include peripheral cholangiocarcinoma and metastatic disease. Tissue sampling recommended. 2. Mild intrahepatic biliary dilatation within the left hepatic lobe. Distortion of the hepatic vasculature without evidence of tumor thrombus. 3. No extrahepatic metastatic disease or primary malignancy identified within the abdomen.   07/26/2020 Imaging   CT AP 07/26/20  IMPRESSION: 1. Markedly poorly visualized and ill-defined 12 cm lesion within the liver. This lesion is better evaluated on MR abdomen 07/10/2020. 2. Otherwise no acute intra-abdominal abnormality with markedly limited evaluation on this noncontrast study. 3.  Aortic Atherosclerosis (ICD10-I70.0).   07/26/2020 Initial Diagnosis   FINAL MICROSCOPIC DIAGNOSIS: 07/26/20  A. LIVER MASS, LEFT, NEEDLE CORE BIOPSY:  - Adenocarcinoma.  - See comment.  COMMENT:  Immunohistochemistry will be performed and reported as an addendum.    07/30/2020 Imaging   CT Chest 07/30/20  IMPRESSION: 1. Pulmonary nodules are highly worrisome for metastatic disease. 2. Hepatic masses are consistent with biopsy-proven adenocarcinoma and better evaluated on MR abdomen 07/10/2020.   08/04/2020 Initial Diagnosis   Intrahepatic cholangiocarcinoma ([place])   08/04/2020 Cancer Staging   Staging form: Intrahepatic Bile Duct, AJCC 8th Edition - Clinical: Stage IB (cT1b, cN0, cM0) - Signed by [name], MD on 08/04/2020   08/13/2020 Procedure   PAC placement   08/16/2020 -  Chemotherapy   First line gemcitabine and cisplatin 2 weeks on/1 week off starting 08/16/20     08/16/2020 -  Chemotherapy   -Addition of durvalumab q3weeks with first-line chemo starting  08/16/20    08/18/2020  Procedure   Upper Endoscopy by Dr [name] IMPRESSION - Normal esophagus. - Z-line regular, 35 cm from the incisors. - Gastritis. Biopsied. - Normal incisura, antrum and pylorus. - Erythematous duodenopathy. Biopsied. - Normal second portion of the duodenum and third portion of the duodenum. - No evidence of primary malignancy site noted on this study.    FINAL MICROSCOPIC DIAGNOSIS:   A. DUODENUM, BULB, BIOPSY:  -  Erosive and reactive duodenal mucosa with Brunner's gland hyperplasia  and mixed inflammation  -  No dysplasia or malignancy identified   B. STOMACH, BIOPSY:  -  Chronic active gastritis  -  H. pylori organisms present  -  No intestinal metaplasia identified  -  See comment   COMMENT:   A.  Dr. [name] reviewed the case and agrees with the above diagnosis.   B.  Warthin-Starry stain is POSITIVE for organisms consistent with  Helicobacter pylori.       CURRENT THERAPY:  First line gemcitabine and cisplatin 2 weeks on/1 week off starting 08/16/09 -Addition of durvalumabq3weeks starting 08/16/20  INTERVAL HISTORY:  Crystal Gibbs is here for a follow up. She was last seen by me 08/23/20. She presents to the clinic with her husband and interpretor. She notes she is feeling much better this week. She notes she is eating better and appetite is good. She notes she was not contacted by GI about her Flagyl. She took for 10/14 days so far. She wants to complete it at this point. She notes she is more fatigued and cannot stand for has long given SOB. She denies cough. She does note headaches. I reviewed her medication list with her. She is not taking Aspirin. She notes she was recently given supplements and wonders can she take it. She also taking drink with electrolytes which is helpful.    REVIEW OF SYSTEMS:   Constitutional: Denies fevers, chills or abnormal weight loss Eyes: Denies blurriness of vision Ears, nose, mouth, throat, and face: Denies mucositis or sore  throat Respiratory: Denies cough, dyspnea or wheezes Cardiovascular: Denies palpitation, chest discomfort or lower extremity swelling Gastrointestinal:  Denies nausea, heartburn or change in bowel habits Skin: Denies abnormal skin rashes Lymphatics: Denies new lymphadenopathy or easy bruising Neurological:Denies numbness, tingling or new weaknesses Behavioral/Psych: Mood is stable, no new changes  All other systems were reviewed with the patient and are negative.  MEDICAL HISTORY:  Past Medical History:  Diagnosis Date  . Cancer ([place])   . Family history of breast cancer 08/23/2020  . Family history of pancreatic cancer 08/23/2020  . Family history of prostate cancer 08/23/2020  . Family history of stomach cancer 08/23/2020  . Fibroid   . History of multiple miscarriages    x 3  . Hypertension     SURGICAL HISTORY: Past Surgical History:  Procedure Laterality Date  . BIOPSY  08/18/2020   Procedure: BIOPSY;  Surgeon: Lavena Bullion, DO;  Location: [facility];  Service: Gastroenterology;;  . CATARACT EXTRACTION Bilateral   . ESOPHAGOGASTRODUODENOSCOPY (EGD) WITH PROPOFOL N/A 08/18/2020   Procedure: ESOPHAGOGASTRODUODENOSCOPY (EGD) WITH PROPOFOL;  Surgeon: Lavena Bullion, DO;  Location: [facility];  Service: Gastroenterology;  Laterality: N/A;  . IR IMAGING GUIDED PORT INSERTION  08/13/2020  . PARATHYROIDECTOMY Right 02/19/15    I have reviewed the social history and family history with the patient and they are unchanged from previous note.  ALLERGIES:  has No Known Allergies.  MEDICATIONS:  Current Outpatient Medications  Medication  Sig Dispense Refill  . amLODipine (NORVASC) 2.5 MG tablet Take 1 tablet (2.5 mg total) by mouth daily. 90 tablet 1  . dexamethasone (DECADRON) 4 MG tablet Take 2 tablets at breakfast for 3 days, starting the day after cisplatin (Patient taking differently: Take 8 mg by mouth See admin instructions. Take 2 tablets at breakfast for 3 days,  starting the day after cisplatin) 40 tablet 2  . doxycycline (VIBRA-TABS) 100 MG tablet Take 1 tablet (100 mg total) by mouth 2 (two) times daily for 14 days. 28 tablet 0  . ibuprofen (ADVIL) 200 MG tablet Take 400 mg by mouth every 6 (six) hours as needed for headache.    . lidocaine-prilocaine (EMLA) cream Apply to affected area once (Patient taking differently: Apply 1 application topically daily as needed (port access).) 30 g 3  . magnesium oxide (MAG-OX) 400 (240 Mg) MG tablet Take 1 tablet (400 mg total) by mouth daily. 30 tablet 0  . metroNIDAZOLE (FLAGYL) 250 MG tablet Take 1 tablet (250 mg total) by mouth 4 (four) times daily for 14 days. 56 tablet 0  . omeprazole (PRILOSEC) 20 MG capsule Take 1 capsule (20 mg total) by mouth 2 (two) times daily before a meal for 14 days. 28 capsule 0  . ondansetron (ZOFRAN ODT) 4 MG disintegrating tablet Take 1-2 tablets (4-8 mg total) by mouth every 6 (six) hours as needed for nausea or vomiting. 30 tablet 1  . ondansetron (ZOFRAN) 8 MG tablet Take 1 tablet (8 mg total) by mouth 2 (two) times daily as needed. Start on the third day after cisplatin chemotherapy. 30 tablet 1  . potassium chloride SA (KLOR-CON) 20 MEQ tablet Take 1 tablet (20 mEq total) by mouth daily. 30 tablet 0  . prochlorperazine (COMPAZINE) 10 MG tablet Take 1 tablet (10 mg total) by mouth every 6 (six) hours as needed (Nausea or vomiting). 30 tablet 1  . traMADol (ULTRAM) 50 MG tablet Take 1 tablet (50 mg total) by mouth every 6 (six) hours as needed. (Patient taking differently: Take 50 mg by mouth every 6 (six) hours as needed for moderate pain.) 30 tablet 0   No current facility-administered medications for this visit.   Facility-Administered Medications Ordered in Other Visits  Medication Dose Route Frequency Provider Last Rate Last Admin  . 0.9 %  sodium chloride infusion   Intravenous Once [name], MD 500 mL/hr at 08/30/20 1454 New Bag at 08/30/20 1454  . gemcitabine (GEMZAR)  1,672 mg in sodium chloride 0.9 % 250 mL chemo infusion  1,000 mg/m2 (Treatment Plan Recorded) Intravenous Once [name], MD        PHYSICAL EXAMINATION: ECOG PERFORMANCE STATUS: 2 - Symptomatic, <50% confined to bed  Vitals:   08/30/20 1316  BP: 103/73  Pulse: (!) 118  Resp: 17  Temp: 98.1 F (36.7 C)  SpO2: 100%   Filed Weights   08/30/20 1316  Weight: 138 lb 12.8 oz (63 kg)    Due to COVID19 we will limit examination to appearance. Patient had no complaints.  GENERAL:alert, no distress and comfortable SKIN: skin color normal, no rashes or significant lesions EYES: normal, Conjunctiva are pink and non-injected, sclera clear  NEURO: alert & oriented x 3 with fluent speech   LABORATORY DATA:  I have reviewed the data as listed CBC Latest Ref Rng & Units 08/30/2020 08/23/2020 08/16/2020  WBC 4.0 - 10.5 K/uL 4.8 8.0 6.9  Hemoglobin 12.0 - 15.0 g/dL 11.3(L) 12.0 13.1  Hematocrit 36.0 -  46.0 % 34.0(L) 34.2(L) 38.8  Platelets 150 - 400 K/uL 313 111(L) 185     CMP Latest Ref Rng & Units 08/30/2020 08/23/2020 08/16/2020  Glucose 70 - 99 mg/dL 110(H) 191(H) 136(H)  BUN 6 - 20 mg/dL 10 10 8   Creatinine 0.44 - 1.00 mg/dL 0.57 0.70 0.61  Sodium 135 - 145 mmol/L 135 129(L) 139  Potassium 3.5 - 5.1 mmol/L 4.0 4.1 3.4(L)  Chloride 98 - 111 mmol/L 101 94(L) 101  CO2 22 - 32 mmol/L 28 26 26   Calcium 8.9 - 10.3 mg/dL 9.2 9.3 9.1  Total Protein 6.5 - 8.1 g/dL 7.0 7.4 7.4  Total Bilirubin 0.3 - 1.2 mg/dL 0.2(L) 0.6 0.5  Alkaline Phos 38 - 126 U/L 211(H) 191(H) 253(H)  AST 15 - 41 U/L 23 25 31   ALT 0 - 44 U/L 11 28 32      RADIOGRAPHIC STUDIES: I have personally reviewed the radiological images as listed and agreed with the findings in the report. No results found.   ASSESSMENT & PLAN:  Crystal Gibbs is a 45 y.o. female with    1. 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  -After 2 months of abdominal pain she was seen to have 12cm liver mass concerning for cancer in March when she was in [place] and again in early April scans at our local hospital.  -Her Liver biopsy from 07/26/20 showed adenocarcinoma. I discussed this type of cancer could be from liver primary or from bile duct primary called Cholangiocarcinoma. There is also possibility her primary cancer is elsewhere, although imaging did not show other malignancy outside of liver. I dicussed scans have limitations.  -Her 07/10/20 MRI abdomen also shows there is at least 1 satellite lesion centrally in the right lobeand her 07/30/20 CT Chest showsPulmonary nodules are highly worrisome for metastatic disease.Given size of lung nodules, we can monitor with repeat scan in late summer 2022.  -Her Upper endoscopy by Dr Arnoldo Lenis 08/18/20 did not show malignancy. 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 -I discussed her case in recent GI tumor Board and based onthe largesize and centrallocation of her liver mass,with satellite lesion,this cancer is unresectable. Given surgery is not an option, her cancer is no longer curable, but still treatable. Systemic chemo is her main treatable to control her disease and prolong her life.  -I started her on chemo with First line gemcitabine and cisplatin 2 weeks on/1 week off in addition to  durvalumabq3weeks based on recent TOPAZ1 trial data. She started on 08/16/20. The goal of chemotherapy is palliative, if surgery is not an option -She did not tolerate first dose well but was able to recover well with 2 weeks off treatment. Labs reviewed and adequate to proceed with C2D1 with Gemcitabine alone. If tolerable will add Cisplatin back at lower dose with C2D8 next week with GCSF  -F/u next week.   2. Abdominal Pain, secondary to #1 -She has had abdominal  pain for the last 2 months. The pain is not daily (mostly with eating and prolonged sitting) but discomfort is mostly daily.  -Pain started while she was in [place] and Work up was concerning for malignancy. She chose to return to the [place] for more workup.  -She is on Tramadol for pain. I encouraged her to watch for constipation on  pain medication.   3. Weight Loss, secondary to #1 -She initially was on weight loss pill RE-Eshape for her IVF. She stopped 3 months ago.  -She still has lost 22 kg recently. I recommend she no longer take any weight loss pill and work on maintaining and gaining weight given her cancer diagnosis.  -She did lose more weight with start of chemo. She is tachycardic which is a sign of dehydration. -I encouraged her to increase protein and calorie intake and drink more fluids. Continue to f/u with dietician.    4. Social and Acupuncturist  -She lives in [place] with her husband, although she was recently frequently in [place] for. She is a [place] Citizen.  -She and her husband does not speak much [place]. They require interpretor,one of who they listed as a point of contact. -She is not working. He has job, but not currently working, as to help his wife.  -She has [employer] health insurance. She does not have a PCP currently in the [place].  -Continue f/u with SW and financial advocate for support and to possibly apply for Medicare/Medicaid.   5. Genetic testing  -Given her age and family history of GI cancer with her mother, she is eligible for genetic testing. She was previously referred.   6. H. Pylori  -She was diagnosed on 08/18/20 Upper Endoscopy with Dr [name]. She is being treated with Flagyl, Prilosec and doxycycline. She is not tolerating Flagyl well, but given she could not reach her GI, she plans to complete it.    7. Right chest tightness and right arm pain  -She notes right shoulder radiating from her right chest tightness and then radiates down  arm. -She has Tramadol for her pain, she can continue or Tylenol/Ibuprofen.   8. Indeterminate lung nodules  -will f/u on next scan    PLAN: -Labs reviewed and adequate to proceed with C2D1 Gemcitabine alone today  -Lab, flush, F/u and chemo Cis/Gem in 1 week with dose reduction, plan to give Neulasta on day 9    No problem-specific Assessment & Plan notes found for this encounter.   No orders of the defined types were placed in this encounter.  All questions were answered. The patient knows to call the clinic with any problems, questions or concerns. No barriers to learning was detected. The total time spent in the appointment was 30 minutes.     [name], MD 08/30/2020   I, Joslyn Devon, am acting as scribe for [name], MD.   I have reviewed the above documentation for accuracy and completeness, and I agree with the above.

## 2020-08-27 NOTE — Telephone Encounter (Addendum)
Left message with follow-up appointments per 5/16 los. Gave option to call back to reschedule if needed. 

## 2020-08-27 NOTE — Telephone Encounter (Signed)
-----   Message from [place], DO sent at 08/25/2020  7:22 AM EDT ----- See below. Patient intolerant of her Abx for H pylori. Could be a good use of the Talicia if this gets covered.   Start Talicia 4 caps PO Z9D J57 days Do NOT need to take Protonix or other PPI while taking this medication.  Stop Metronidazole, Doxy  Thanks  ----- Message ----- From: [name], MD Sent: 08/24/2020  11:14 PM EDT To: Lavena Bullion, DO, [place],   She started chemo a week ago, she also started HP with PPI, flagyl and doxycycline on 5/13. She developed anorexia, nausea, fatigue and taste change since 5/14, not sure if this is related to her antibiotics, I usually see chemo related side effects earlier than this. I held chemo yesterday, plan to restart next Monday. Could you modify her HP therapy slightly to make it more tolerable? I know it's important to treat her HP, I do not want her to stop therapy.   Thanks   Krista Blue

## 2020-08-27 NOTE — Telephone Encounter (Signed)
Contacted the patient and she states that she is okay now with taking the metronidazole. She was having a headache after taking it but states she is taking an ibuprofen and it has helped. Does not want to change this mediation. She will call back if things change.

## 2020-08-28 ENCOUNTER — Other Ambulatory Visit: Payer: Self-pay | Admitting: Internal Medicine

## 2020-08-28 MED ORDER — AMLODIPINE BESYLATE 2.5 MG PO TABS: 2.5000 mg | ORAL_TABLET | Freq: Every day | ORAL | 1 refills | Status: DC

## 2020-08-28 NOTE — Progress Notes (Signed)
Patient called requesting refill of Amlodipine. She only has one pill left.

## 2020-08-30 ENCOUNTER — Inpatient Hospital Stay: Payer: 59

## 2020-08-30 ENCOUNTER — Other Ambulatory Visit: Payer: Self-pay

## 2020-08-30 ENCOUNTER — Encounter: Payer: Self-pay | Admitting: Hematology

## 2020-08-30 ENCOUNTER — Inpatient Hospital Stay (HOSPITAL_BASED_OUTPATIENT_CLINIC_OR_DEPARTMENT_OTHER): Payer: 59 | Admitting: Hematology

## 2020-08-30 VITALS — HR 87

## 2020-08-30 VITALS — BP 103/73 | HR 118 | Temp 98.1°F | Resp 17 | Ht 64.0 in | Wt 138.8 lb

## 2020-08-30 DIAGNOSIS — C221 Intrahepatic bile duct carcinoma: Secondary | ICD-10-CM

## 2020-08-30 DIAGNOSIS — Z5111 Encounter for antineoplastic chemotherapy: Secondary | ICD-10-CM | POA: Diagnosis not present

## 2020-08-30 DIAGNOSIS — Z95828 Presence of other vascular implants and grafts: Secondary | ICD-10-CM

## 2020-08-30 LAB — CMP (CANCER CENTER ONLY)
ALT: 11 U/L (ref 0–44)
AST: 23 U/L (ref 15–41)
Albumin: 2.7 g/dL — ABNORMAL LOW (ref 3.5–5.0)
Alkaline Phosphatase: 211 U/L — ABNORMAL HIGH (ref 38–126)
Anion gap: 6 (ref 5–15)
BUN: 10 mg/dL (ref 6–20)
CO2: 28 mmol/L (ref 22–32)
Calcium: 9.2 mg/dL (ref 8.9–10.3)
Chloride: 101 mmol/L (ref 98–111)
Creatinine: 0.57 mg/dL (ref 0.44–1.00)
GFR, Estimated: >60 mL/min (ref >60)
Glucose, Bld: 110 mg/dL — ABNORMAL HIGH (ref 70–99)
Potassium: 4 mmol/L (ref 3.5–5.1)
Sodium: 135 mmol/L (ref 135–145)
Total Bilirubin: 0.2 mg/dL — ABNORMAL LOW (ref 0.3–1.2)
Total Protein: 7 g/dL (ref 6.5–8.1)

## 2020-08-30 LAB — CBC WITH DIFFERENTIAL (CANCER CENTER ONLY)
Abs Immature Granulocytes: 0.03 10*3/uL (ref 0.00–0.07)
Basophils Absolute: 0 10*3/uL (ref 0.0–0.1)
Basophils Relative: 0 %
Eosinophils Absolute: 0 10*3/uL (ref 0.0–0.5)
Eosinophils Relative: 1 %
HCT: 34 % — ABNORMAL LOW (ref 36.0–46.0)
Hemoglobin: 11.3 g/dL — ABNORMAL LOW (ref 12.0–15.0)
Immature Granulocytes: 1 %
Lymphocytes Relative: 41 %
Lymphs Abs: 2 10*3/uL (ref 0.7–4.0)
MCH: 28 pg (ref 26.0–34.0)
MCHC: 33.2 g/dL (ref 30.0–36.0)
MCV: 84.4 fL (ref 80.0–100.0)
Monocytes Absolute: 0.8 10*3/uL (ref 0.1–1.0)
Monocytes Relative: 16 %
Neutro Abs: 2 10*3/uL (ref 1.7–7.7)
Neutrophils Relative %: 41 %
Platelet Count: 313 10*3/uL (ref 150–400)
RBC: 4.03 MIL/uL (ref 3.87–5.11)
RDW: 14 % (ref 11.5–15.5)
WBC Count: 4.8 10*3/uL (ref 4.0–10.5)
nRBC: 0 % (ref 0.0–0.2)

## 2020-08-30 LAB — TSH: TSH: 1.591 u[IU]/mL (ref 0.308–3.960)

## 2020-08-30 LAB — MAGNESIUM: Magnesium: 1.8 mg/dL (ref 1.7–2.4)

## 2020-08-30 LAB — PREGNANCY, URINE: Preg Test, Ur: NEGATIVE

## 2020-08-30 MED ORDER — SODIUM CHLORIDE 0.9% FLUSH
10.0000 mL | Freq: Once | INTRAVENOUS | Status: AC
  Administered 2020-08-30: 10 mL
  Filled 2020-08-30: qty 10

## 2020-08-30 MED ORDER — SODIUM CHLORIDE 0.9 % IV SOLN
1000.0000 mg/m2 | Freq: Once | INTRAVENOUS | Status: AC
  Administered 2020-08-30: 1672 mg via INTRAVENOUS
  Filled 2020-08-30: qty 43.97

## 2020-08-30 MED ORDER — HEPARIN SOD (PORK) LOCK FLUSH 100 UNIT/ML IV SOLN
250.0000 [IU] | Freq: Once | INTRAVENOUS | Status: AC
Start: 2020-08-30 — End: 2020-08-30
  Administered 2020-08-30: 500 [IU]
  Filled 2020-08-30: qty 5

## 2020-08-30 MED ORDER — SODIUM CHLORIDE 0.9 % IV SOLN
10.0000 mg | Freq: Once | INTRAVENOUS | Status: AC
  Administered 2020-08-30: 10 mg via INTRAVENOUS
  Filled 2020-08-30: qty 10

## 2020-08-30 MED ORDER — PALONOSETRON HCL INJECTION 0.25 MG/5ML: 0.2500 mg | Freq: Once | INTRAVENOUS | Status: DC

## 2020-08-30 MED ORDER — SODIUM CHLORIDE 0.9 % IV SOLN
Freq: Once | INTRAVENOUS | Status: AC
  Filled 2020-08-30: qty 250

## 2020-08-30 MED ORDER — PALONOSETRON HCL INJECTION 0.25 MG/5ML
INTRAVENOUS | Status: AC
  Filled 2020-08-30: qty 5

## 2020-08-30 MED ORDER — SODIUM CHLORIDE 0.9 % IV SOLN
10.0000 mg | Freq: Once | INTRAVENOUS | Status: DC
  Filled 2020-08-30: qty 1

## 2020-08-30 MED ORDER — PALONOSETRON HCL INJECTION 0.25 MG/5ML
0.2500 mg | Freq: Once | INTRAVENOUS | Status: AC
  Administered 2020-08-30: 0.25 mg via INTRAVENOUS

## 2020-08-30 NOTE — Patient Instructions (Signed)
[facility]  Discharge Instructions: Thank you for choosing [facility] to provide your oncology and hematology care.   If you have a lab appointment with the Cancer Center, please go directly to the Cancer Center and check in at the registration area.   Wear comfortable clothing and clothing appropriate for easy access to any Portacath or PICC line.   We strive to give you quality time with your provider. You may need to reschedule your appointment if you arrive late (15 or more minutes).  Arriving late affects you and other patients whose appointments are after yours.  Also, if you miss three or more appointments without notifying the office, you may be dismissed from the clinic at the provider's discretion.      For prescription refill requests, have your pharmacy contact our office and allow 72 hours for refills to be completed.    Today you received the following chemotherapy and/or immunotherapy agents Gemzar      To help prevent nausea and vomiting after your treatment, we encourage you to take your nausea medication as directed.  BELOW ARE SYMPTOMS THAT SHOULD BE REPORTED IMMEDIATELY: *FEVER GREATER THAN 100.4 F (38 C) OR HIGHER *CHILLS OR SWEATING *NAUSEA AND VOMITING THAT IS NOT CONTROLLED WITH YOUR NAUSEA MEDICATION *UNUSUAL SHORTNESS OF BREATH *UNUSUAL BRUISING OR BLEEDING *URINARY PROBLEMS (pain or burning when urinating, or frequent urination) *BOWEL PROBLEMS (unusual diarrhea, constipation, pain near the anus) TENDERNESS IN MOUTH AND THROAT WITH OR WITHOUT PRESENCE OF ULCERS (sore throat, sores in mouth, or a toothache) UNUSUAL RASH, SWELLING OR PAIN  UNUSUAL VAGINAL DISCHARGE OR ITCHING   Items with * indicate a potential emergency and should be followed up as soon as possible or go to the Emergency Department if any problems should occur.  Please show the CHEMOTHERAPY ALERT CARD or IMMUNOTHERAPY ALERT CARD at check-in to the  Emergency Department and triage nurse.  Should you have questions after your visit or need to cancel or reschedule your appointment, please contact [facility]  Dept: [phone]  and follow the prompts.  Office hours are 8:00 a.m. to 4:30 p.m. Monday - Friday. Please note that voicemails left after 4:00 p.m. may not be returned until the following business day.  We are closed weekends and major holidays. You have access to a nurse at all times for urgent questions. Please call the main number to the clinic Dept: [phone] and follow the prompts.   For any non-urgent questions, you may also contact your provider using MyChart. We now offer e-Visits for anyone 18 and older to request care online for non-urgent symptoms. For details visit [email or web address].   Also download the MyChart app! Go to the app store, search "MyChart", open the app, select [facility], and log in with your MyChart username and password.  Due to Covid, a mask is required upon entering the hospital/clinic. If you do not have a mask, one will be given to you upon arrival. For doctor visits, patients may have 1 support person aged 18 or older with them. For treatment visits, patients cannot have anyone with them due to current Covid guidelines and our immunocompromised population.   

## 2020-08-30 NOTE — Progress Notes (Signed)
Per MD Burr Medico, ok to treat with HR today. Per MD Burr Medico give IVF (see eMAR) with tx

## 2020-08-30 NOTE — Progress Notes (Signed)
..  The following Medication: Neulasta is approved for drug replacement program by [employer]. The enrollment period is from 08/30/2020 to 04/09/2021.  Reason for Assistance: Insurance Denial. ID: [id number] First DOS:09/15/2020.

## 2020-08-31 ENCOUNTER — Encounter: Payer: Self-pay | Admitting: Hematology

## 2020-08-31 LAB — T4: T4, Total: 7.8 ug/dL (ref 4.5–12.0)

## 2020-08-31 LAB — CANCER ANTIGEN 19-9: CA 19-9: 51 U/mL — ABNORMAL HIGH (ref 0–35)

## 2020-08-31 NOTE — Addendum Note (Signed)
Addended by: [name] on: 08/31/2020 02:28 PM   Modules accepted: Orders

## 2020-09-01 NOTE — Progress Notes (Signed)
[place]   Telephone:([phone]) [phone] Fax:(336) [phone]   Clinic Follow up Note   Patient Care Team: Program, [facility] as PCP - General [name], MD as Consulting Physician (Oncology) [name], RN as Oncology Nurse Navigator  Date of Service:  09/07/2020  CHIEF COMPLAINT: F/u of intrahepatic cholangiocarcinoma   SUMMARY OF ONCOLOGIC HISTORY: Oncology History Overview Note  Cancer Staging Intrahepatic cholangiocarcinoma ([place]) Staging form: Intrahepatic Bile Duct, AJCC 8th Edition - Clinical: Stage IB (cT1b, cN0, cM0) - Signed by [name], MD on 08/04/2020    Intrahepatic cholangiocarcinoma ([place])  08/19/2019 Procedure   Upper Endoscopy by Dr [name]  IMPRESSION - Normal esophagus. - Z-line regular, 35 cm from the incisors. - Gastritis. Biopsied. - Normal incisura, antrum and pylorus. - Erythematous duodenopathy. Biopsied. - Normal second portion of the duodenum and third portion of the duodenum. - No evidence of primary malignancy site noted on this study.   07/10/2020 Imaging   US Abdomen 07/10/20 IMPRESSION: 1.  No acute hepatobiliary findings.   2. Heterogeneous liver echotexture with 3 masslike areas as described measuring 4.3 cm, 5.4 cm and 6.4 cm respectively. Recommend CT abdomen/pelvis with intravenous contrast for further evaluation.   07/10/2020 Imaging   CT AP 07/10/20  IMPRESSION: 1. There is a large masslike collection or mass in the central liver measuring at least 10.9 x 7.1 x 10.2 cm with 2 smaller adjacent satellite lesions/collections measuring 11 and 17 mm. These findings may represent a large abscess with satellite abscesses or a large neoplasm/malignancy. Recommend MRI for further evaluation. 2. Left renal cyst. 3. Calcified atherosclerosis in the distal abdominal aorta is mild. 4. Fibroid uterus.   07/10/2020 Imaging   MRI Abdomen 07/10/20  IMPRESSION: 1. The large central hepatic mass does  not demonstrate signal characteristics or enhancement typical of a hemangioma or abscess, and is suspicious for malignancy. There is at least 1 satellite lesion centrally in the right lobe. As there are no signs of underlying cirrhosis, primary considerations include peripheral cholangiocarcinoma and metastatic disease. Tissue sampling recommended. 2. Mild intrahepatic biliary dilatation within the left hepatic lobe. Distortion of the hepatic vasculature without evidence of tumor thrombus. 3. No extrahepatic metastatic disease or primary malignancy identified within the abdomen.   07/26/2020 Imaging   CT AP 07/26/20  IMPRESSION: 1. Markedly poorly visualized and ill-defined 12 cm lesion within the liver. This lesion is better evaluated on MR abdomen 07/10/2020. 2. Otherwise no acute intra-abdominal abnormality with markedly limited evaluation on this noncontrast study. 3.  Aortic Atherosclerosis (ICD10-I70.0).   07/26/2020 Initial Diagnosis   FINAL MICROSCOPIC DIAGNOSIS: 07/26/20  A. LIVER MASS, LEFT, NEEDLE CORE BIOPSY:  - Adenocarcinoma.  - See comment.  COMMENT:  Immunohistochemistry will be performed and reported as an addendum.    07/30/2020 Imaging   CT Chest 07/30/20  IMPRESSION: 1. Pulmonary nodules are highly worrisome for metastatic disease. 2. Hepatic masses are consistent with biopsy-proven adenocarcinoma and better evaluated on MR abdomen 07/10/2020.   08/04/2020 Initial Diagnosis   Intrahepatic cholangiocarcinoma ([place])   08/04/2020 Cancer Staging   Staging form: Intrahepatic Bile Duct, AJCC 8th Edition - Clinical: Stage IB (cT1b, cN0, cM0) - Signed by [name], MD on 08/04/2020   08/13/2020 Procedure   PAC placement   08/16/2020 -  Chemotherapy   First line gemcitabine and cisplatin 2 weeks on/1 week off starting 08/16/20     08/16/2020 -  Chemotherapy   -Addition of durvalumab q3weeks with first-line chemo starting  08/16/20    08/18/2020 Procedure   Upper  Endoscopy by Dr [name] IMPRESSION - Normal esophagus. - Z-line regular, 35 cm from the incisors. - Gastritis. Biopsied. - Normal incisura, antrum and pylorus. - Erythematous duodenopathy. Biopsied. - Normal second portion of the duodenum and third portion of the duodenum. - No evidence of primary malignancy site noted on this study.    FINAL MICROSCOPIC DIAGNOSIS:   A. DUODENUM, BULB, BIOPSY:  -  Erosive and reactive duodenal mucosa with Brunner's gland hyperplasia  and mixed inflammation  -  No dysplasia or malignancy identified   B. STOMACH, BIOPSY:  -  Chronic active gastritis  -  H. pylori organisms present  -  No intestinal metaplasia identified  -  See comment   COMMENT:   A.  Dr. [name] reviewed the case and agrees with the above diagnosis.   B.  Warthin-Starry stain is POSITIVE for organisms consistent with  Helicobacter pylori.       CURRENT THERAPY:  First line gemcitabine and cisplatin2 weeks on/1 week off starting 08/16/09 -Addition ofdurvalumabq3weeksstarting 08/16/20  INTERVAL HISTORY:  Crystal Gibbs is here for a follow up. She was last seen by me 08/30/20. She presents to the clinic with interpretor and I saw her in infusion room.  She tolerated chemotherapy well last week, no noticeable side effects.  Her main concern is fatigue, she is able to do all ADLs, but not much other activities, and she lays in bed frequently.  Her appetite is fair, no nausea, vomiting.  Her epigastric pain has resolved since she started chemotherapy, she has intermittent left flank pain with movement, which is tolerable and resolves spontaneously.  No fever, cough, or other new complaints.  All other systems were reviewed with the patient and are negative.  MEDICAL HISTORY:  Past Medical History:  Diagnosis Date  . Cancer ([place])   . Family history of breast cancer 08/23/2020  . Family history of pancreatic cancer 08/23/2020  . Family history of prostate cancer 08/23/2020  .  Family history of stomach cancer 08/23/2020  . Fibroid   . History of multiple miscarriages    x 3  . Hypertension     SURGICAL HISTORY: Past Surgical History:  Procedure Laterality Date  . BIOPSY  08/18/2020   Procedure: BIOPSY;  Surgeon: Lavena Bullion, DO;  Location: [facility];  Service: Gastroenterology;;  . CATARACT EXTRACTION Bilateral   . ESOPHAGOGASTRODUODENOSCOPY (EGD) WITH PROPOFOL N/A 08/18/2020   Procedure: ESOPHAGOGASTRODUODENOSCOPY (EGD) WITH PROPOFOL;  Surgeon: Lavena Bullion, DO;  Location: [facility];  Service: Gastroenterology;  Laterality: N/A;  . IR IMAGING GUIDED PORT INSERTION  08/13/2020  . PARATHYROIDECTOMY Right 02/19/15    I have reviewed the social history and family history with the patient and they are unchanged from previous note.  ALLERGIES:  has No Known Allergies.  MEDICATIONS:  Current Outpatient Medications  Medication Sig Dispense Refill  . amLODipine (NORVASC) 2.5 MG tablet Take 1 tablet (2.5 mg total) by mouth daily. 90 tablet 1  . dexamethasone (DECADRON) 4 MG tablet Take 2 tablets at breakfast for 3 days, starting the day after cisplatin (Patient taking differently: Take 8 mg by mouth See admin instructions. Take 2 tablets at breakfast for 3 days, starting the day after cisplatin) 40 tablet 2  . ibuprofen (ADVIL) 200 MG tablet Take 400 mg by mouth every 6 (six) hours as needed for headache.    . lidocaine-prilocaine (EMLA) cream Apply to affected area once (Patient taking differently: Apply 1  application topically daily as needed (port access).) 30 g 3  . magnesium oxide (MAG-OX) 400 (240 Mg) MG tablet Take 1 tablet (400 mg total) by mouth daily. 30 tablet 0  . omeprazole (PRILOSEC) 20 MG capsule Take 1 capsule (20 mg total) by mouth 2 (two) times daily before a meal for 14 days. 28 capsule 0  . ondansetron (ZOFRAN ODT) 4 MG disintegrating tablet Take 1-2 tablets (4-8 mg total) by mouth every 6 (six) hours as needed for nausea or  vomiting. 30 tablet 1  . ondansetron (ZOFRAN) 8 MG tablet Take 1 tablet (8 mg total) by mouth 2 (two) times daily as needed. Start on the third day after cisplatin chemotherapy. 30 tablet 1  . potassium chloride SA (KLOR-CON) 20 MEQ tablet Take 1 tablet (20 mEq total) by mouth daily. 30 tablet 0  . prochlorperazine (COMPAZINE) 10 MG tablet Take 1 tablet (10 mg total) by mouth every 6 (six) hours as needed (Nausea or vomiting). 30 tablet 1  . traMADol (ULTRAM) 50 MG tablet Take 1 tablet (50 mg total) by mouth every 6 (six) hours as needed. (Patient taking differently: Take 50 mg by mouth every 6 (six) hours as needed for moderate pain.) 30 tablet 0   No current facility-administered medications for this visit.   Facility-Administered Medications Ordered in Other Visits  Medication Dose Route Frequency Provider Last Rate Last Admin  . CISplatin (PLATINOL) 42 mg in sodium chloride 0.9 % 250 mL chemo infusion  25 mg/m2 (Treatment Plan Recorded) Intravenous Once [name], MD 292 mL/hr at 09/07/20 1412 42 mg at 09/07/20 1412  . heparin lock flush 100 unit/mL  500 Units Intracatheter Once PRN [name], MD      . sodium chloride flush (NS) 0.9 % injection 10 mL  10 mL Intracatheter PRN [name], MD        PHYSICAL EXAMINATION: ECOG PERFORMANCE STATUS: 3 - Symptomatic, >50% confined to bed Blood pressure 129/90, heart rate 101, respiratory rate 17, temperature 36.9, pulse ox 100% on room air GENERAL:alert, no distress and comfortable SKIN: skin color, texture, turgor are normal, no rashes or significant lesions EYES: normal, Conjunctiva are pink and non-injected, sclera clear NECK: supple, thyroid normal size, non-tender, without nodularity LYMPH:  no palpable lymphadenopathy in the cervical, axillary  LUNGS: clear to auscultation and percussion with normal breathing effort HEART: regular rate & rhythm and no murmurs and no lower extremity edema ABDOMEN:abdomen soft, non-tender and normal bowel  sounds Musculoskeletal:no cyanosis of digits and no clubbing  NEURO: alert & oriented x 3 with fluent speech, no focal motor/sensory deficits  LABORATORY DATA:  I have reviewed the data as listed CBC Latest Ref Rng & Units 09/07/2020 08/30/2020 08/23/2020  WBC 4.0 - 10.5 K/uL 6.6 4.8 8.0  Hemoglobin 12.0 - 15.0 g/dL 11.5(L) 11.3(L) 12.0  Hematocrit 36.0 - 46.0 % 34.7(L) 34.0(L) 34.2(L)  Platelets 150 - 400 K/uL 257 313 111(L)     CMP Latest Ref Rng & Units 09/07/2020 08/30/2020 08/23/2020  Glucose 70 - 99 mg/dL 193(H) 110(H) 191(H)  BUN 6 - 20 mg/dL 8 10 10   Creatinine 0.44 - 1.00 mg/dL 0.61 0.57 0.70  Sodium 135 - 145 mmol/L 139 135 129(L)  Potassium 3.5 - 5.1 mmol/L 3.9 4.0 4.1  Chloride 98 - 111 mmol/L 101 101 94(L)  CO2 22 - 32 mmol/L 27 28 26   Calcium 8.9 - 10.3 mg/dL 9.6 9.2 9.3  Total Protein 6.5 - 8.1 g/dL 7.0 7.0 7.4  Total Bilirubin  0.3 - 1.2 mg/dL 0.3 0.2(L) 0.6  Alkaline Phos 38 - 126 U/L 227(H) 211(H) 191(H)  AST 15 - 41 U/L 33 23 25  ALT 0 - 44 U/L 16 11 28       RADIOGRAPHIC STUDIES: I have personally reviewed the radiological images as listed and agreed with the findings in the report. No results found.   ASSESSMENT & PLAN:  Crystal Gibbs is a 45 y.o. female with    1. 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  -After 2 months of abdominal pain she was seen to have 12cm liver mass concerning for cancer in March when she was in [place] and again in early April scans at our local hospital.  -Her Liver biopsy from 07/26/20 showed adenocarcinoma. I discussed this type of cancer could be from liver primary or from bile duct primary called Cholangiocarcinoma. There is also possibility her primary cancer is elsewhere, although imaging did not show other malignancy outside of liver. I dicussed scans have limitations.   -Her 07/10/20 MRI abdomen also shows there is at least 1 satellite lesion centrally in the right lobeand her 07/30/20 CT Chest showsPulmonary nodules are highly worrisome for metastatic disease.Given size of lung nodules, we can monitor with repeat scan in late summer 2022.  -Her Upper endoscopy by DrCiriglianoon 08/18/20 did not show malignancy. I discussed given, no evidence of other malignancy, this is bile ductCholangiocarcinomaprimary with liver metastasis. -I discussedher case in recent GI tumor Board andbased onthe largesize and centrallocation of her liver mass,with satellite lesion,this cancer is unresectable.Given surgery is not an option, her cancer is no longer curable, but still treatable. Systemic chemo is her main treatable to control her disease and prolong her life. -Istarted her on chemo withFirst line gemcitabine and cisplatin2 weeks on/1 week offin addition to durvalumabq3weeksbased onrecent TOPAZ1 trial data. She started on 08/16/20.The goal of chemotherapy is palliative, if surgery is not an option -She tolerated cycle 2-day 1 chemotherapy with single agent gemcitabine well, lab reviewed, adequate for treatment, will proceed cycle 2-day 8 cisplatin and gemcitabine at full dose today. -She is due for cycle 2 durvalumab today, will proceed -Her abdominal pain has improved since she started chemotherapy, her main concern is profound fatigue, I encouraged her to eat well and to try to stay physically active if she can -Follow-up in 2 weeks before cycle 3.   2. Abdominal Pain, secondary to #1 -She has had abdominal pain for the last 2 months. The pain is not daily (mostly with eating and prolonged sitting) but discomfort is mostly daily.  -Pain started while she was in [place] and Work up was concerning for malignancy. She chose to return to the [place] for more workup.  -Patient has much improved since she started chemo, continue monitoring and tramadol as needed  3.  Fatigue  -Secondary to underlying malignancy and chemotherapy -She reports good response to dexamethasone, which unfortunately would be contraindicated due to his immunotherapy.  I recommend her not to use it after chemo.  She agrees. -I encouraged her to eat well and stay active physically  4. Social and Acupuncturist  -She lives in [place] with her husband, although she was recently frequently in [place] for. She is a [place] Citizen.  -She and her husband does not speak much [place]. They require interpretor,one of who they listed as a point of contact. -She is  not working. He has job, but not currently working, as to help his wife.  -She has [employer] health insurance. She does not have a PCP currently in the [place].  -Continue f/u with [facility] financial advocate for support and to possibly apply for Medicare/Medicaid.   5. Genetic testing  -Given her age and family history of GI cancer with her mother, she is eligible for genetic testing. She waspreviouslyreferred.   6. H. Pylori  -She was diagnosed on 08/18/20 Upper Endoscopy with Dr [name]. She is being treated with Flagyl, Prilosec and doxycycline.  -She has completed therapy  7. Right chest tightness and right arm pain  -She notes right shoulder radiating from her right chest tightness and then radiates down arm. -She has Tramadol for her pain, she can continue or Tylenol/Ibuprofen. -improved   8. Indeterminate lung nodules  -will f/u on next scan    PLAN: -Labs reviewed and adequate to proceed with C2D8 cisplatin and Gemcitabine at full dose and cycle 2 durvalumab today  -GCSF tomorrow  -Lab, flush, F/u and chemo Cis/Gem in 2 weeks     No problem-specific Assessment & Plan notes found for this encounter.   No orders of the defined types were placed in this encounter.  All questions were answered. The patient knows to call the clinic with any problems, questions or concerns. No barriers to learning was  detected. The total time spent in the appointment was 30 minutes.     [name], MD 09/07/2020   I, Joslyn Devon, am acting as scribe for [name], MD.   I have reviewed the above documentation for accuracy and completeness, and I agree with the above.

## 2020-09-02 ENCOUNTER — Inpatient Hospital Stay: Payer: 59 | Admitting: Hematology

## 2020-09-02 ENCOUNTER — Inpatient Hospital Stay: Payer: 59

## 2020-09-03 ENCOUNTER — Inpatient Hospital Stay: Payer: 59

## 2020-09-07 ENCOUNTER — Inpatient Hospital Stay (HOSPITAL_BASED_OUTPATIENT_CLINIC_OR_DEPARTMENT_OTHER): Payer: 59 | Admitting: Hematology

## 2020-09-07 ENCOUNTER — Other Ambulatory Visit: Payer: Self-pay

## 2020-09-07 ENCOUNTER — Encounter: Payer: Self-pay | Admitting: Hematology

## 2020-09-07 ENCOUNTER — Inpatient Hospital Stay: Payer: 59

## 2020-09-07 VITALS — BP 129/90 | HR 99 | Temp 98.4°F | Resp 17 | Wt 139.0 lb

## 2020-09-07 DIAGNOSIS — C221 Intrahepatic bile duct carcinoma: Secondary | ICD-10-CM

## 2020-09-07 DIAGNOSIS — Z5111 Encounter for antineoplastic chemotherapy: Secondary | ICD-10-CM | POA: Diagnosis not present

## 2020-09-07 DIAGNOSIS — Z95828 Presence of other vascular implants and grafts: Secondary | ICD-10-CM

## 2020-09-07 LAB — CMP (CANCER CENTER ONLY)
ALT: 16 U/L (ref 0–44)
AST: 33 U/L (ref 15–41)
Albumin: 2.7 g/dL — ABNORMAL LOW (ref 3.5–5.0)
Alkaline Phosphatase: 227 U/L — ABNORMAL HIGH (ref 38–126)
Anion gap: 11 (ref 5–15)
BUN: 8 mg/dL (ref 6–20)
CO2: 27 mmol/L (ref 22–32)
Calcium: 9.6 mg/dL (ref 8.9–10.3)
Chloride: 101 mmol/L (ref 98–111)
Creatinine: 0.61 mg/dL (ref 0.44–1.00)
GFR, Estimated: >60 mL/min (ref >60)
Glucose, Bld: 193 mg/dL — ABNORMAL HIGH (ref 70–99)
Potassium: 3.9 mmol/L (ref 3.5–5.1)
Sodium: 139 mmol/L (ref 135–145)
Total Bilirubin: 0.3 mg/dL (ref 0.3–1.2)
Total Protein: 7 g/dL (ref 6.5–8.1)

## 2020-09-07 LAB — CBC WITH DIFFERENTIAL (CANCER CENTER ONLY)
Abs Immature Granulocytes: 0.17 10*3/uL — ABNORMAL HIGH (ref 0.00–0.07)
Basophils Absolute: 0.1 10*3/uL (ref 0.0–0.1)
Basophils Relative: 1 %
Eosinophils Absolute: 0 10*3/uL (ref 0.0–0.5)
Eosinophils Relative: 1 %
HCT: 34.7 % — ABNORMAL LOW (ref 36.0–46.0)
Hemoglobin: 11.5 g/dL — ABNORMAL LOW (ref 12.0–15.0)
Immature Granulocytes: 3 %
Lymphocytes Relative: 36 %
Lymphs Abs: 2.3 10*3/uL (ref 0.7–4.0)
MCH: 28 pg (ref 26.0–34.0)
MCHC: 33.1 g/dL (ref 30.0–36.0)
MCV: 84.4 fL (ref 80.0–100.0)
Monocytes Absolute: 0.7 10*3/uL (ref 0.1–1.0)
Monocytes Relative: 11 %
Neutro Abs: 3.2 10*3/uL (ref 1.7–7.7)
Neutrophils Relative %: 48 %
Platelet Count: 257 10*3/uL (ref 150–400)
RBC: 4.11 MIL/uL (ref 3.87–5.11)
RDW: 14 % (ref 11.5–15.5)
WBC Count: 6.6 10*3/uL (ref 4.0–10.5)
nRBC: 0 % (ref 0.0–0.2)

## 2020-09-07 LAB — TSH: TSH: 2.55 u[IU]/mL (ref 0.308–3.960)

## 2020-09-07 LAB — PREGNANCY, URINE: Preg Test, Ur: NEGATIVE

## 2020-09-07 LAB — MAGNESIUM: Magnesium: 1.8 mg/dL (ref 1.7–2.4)

## 2020-09-07 MED ORDER — ACETAMINOPHEN 325 MG PO TABS
ORAL_TABLET | ORAL | Status: AC
  Filled 2020-09-07: qty 2

## 2020-09-07 MED ORDER — SODIUM CHLORIDE 0.9 % IV SOLN
10.0000 mg | Freq: Once | INTRAVENOUS | Status: AC
  Administered 2020-09-07: 10 mg via INTRAVENOUS
  Filled 2020-09-07: qty 10

## 2020-09-07 MED ORDER — SODIUM CHLORIDE 0.9 % IV SOLN: Freq: Once | INTRAVENOUS | Status: DC

## 2020-09-07 MED ORDER — SODIUM CHLORIDE 0.9 % IV SOLN
25.0000 mg/m2 | Freq: Once | INTRAVENOUS | Status: AC
  Administered 2020-09-07: 42 mg via INTRAVENOUS
  Filled 2020-09-07: qty 42

## 2020-09-07 MED ORDER — SODIUM CHLORIDE 0.9% FLUSH
10.0000 mL | INTRAVENOUS | Status: DC | PRN
Start: 2020-09-07 — End: 2020-09-07
  Administered 2020-09-07: 10 mL
  Filled 2020-09-07: qty 10

## 2020-09-07 MED ORDER — HEPARIN SOD (PORK) LOCK FLUSH 100 UNIT/ML IV SOLN
500.0000 [IU] | Freq: Once | INTRAVENOUS | Status: AC | PRN
Start: 2020-09-07 — End: 2020-09-07
  Administered 2020-09-07: 500 [IU]
  Filled 2020-09-07: qty 5

## 2020-09-07 MED ORDER — SODIUM CHLORIDE 0.9 % IV SOLN
150.0000 mg | Freq: Once | INTRAVENOUS | Status: AC
  Administered 2020-09-07: 150 mg via INTRAVENOUS
  Filled 2020-09-07: qty 150

## 2020-09-07 MED ORDER — MAGNESIUM SULFATE 2 GM/50ML IV SOLN
INTRAVENOUS | Status: AC
  Filled 2020-09-07: qty 50

## 2020-09-07 MED ORDER — SODIUM CHLORIDE 0.9 % IV SOLN
1500.0000 mg | Freq: Once | INTRAVENOUS | Status: AC
  Administered 2020-09-07: 1500 mg via INTRAVENOUS
  Filled 2020-09-07: qty 30

## 2020-09-07 MED ORDER — PALONOSETRON HCL INJECTION 0.25 MG/5ML
0.2500 mg | Freq: Once | INTRAVENOUS | Status: AC
  Administered 2020-09-07: 0.25 mg via INTRAVENOUS

## 2020-09-07 MED ORDER — PALONOSETRON HCL INJECTION 0.25 MG/5ML
INTRAVENOUS | Status: AC
  Filled 2020-09-07: qty 5

## 2020-09-07 MED ORDER — POTASSIUM CHLORIDE IN NACL 20-0.9 MEQ/L-% IV SOLN
Freq: Once | INTRAVENOUS | Status: AC
Start: 2020-09-07 — End: 2020-09-07
  Filled 2020-09-07: qty 1000

## 2020-09-07 MED ORDER — SODIUM CHLORIDE 0.9 % IV SOLN
Freq: Once | INTRAVENOUS | Status: AC
  Filled 2020-09-07: qty 250

## 2020-09-07 MED ORDER — MAGNESIUM SULFATE 2 GM/50ML IV SOLN
2.0000 g | Freq: Once | INTRAVENOUS | Status: AC
  Administered 2020-09-07: 2 g via INTRAVENOUS

## 2020-09-07 MED ORDER — SODIUM CHLORIDE 0.9 % IV SOLN
1000.0000 mg/m2 | Freq: Once | INTRAVENOUS | Status: AC
  Administered 2020-09-07: 1672 mg via INTRAVENOUS
  Filled 2020-09-07: qty 43.97

## 2020-09-07 MED ORDER — ACETAMINOPHEN 325 MG PO TABS
650.0000 mg | ORAL_TABLET | Freq: Once | ORAL | Status: AC
  Administered 2020-09-07: 650 mg via ORAL

## 2020-09-07 MED ORDER — SODIUM CHLORIDE 0.9% FLUSH
10.0000 mL | Freq: Once | INTRAVENOUS | Status: AC
  Administered 2020-09-07: 10 mL
  Filled 2020-09-07: qty 10

## 2020-09-07 NOTE — Patient Instructions (Signed)
[facility]  Discharge Instructions: Thank you for choosing [facility] to provide your oncology and hematology care.   If you have a lab appointment with the [facility], please go directly to the [facility] and check in at the registration area.   Wear comfortable clothing and clothing appropriate for easy access to any Portacath or PICC line.   We strive to give you quality time with your provider. You may need to reschedule your appointment if you arrive late (15 or more minutes).  Arriving late affects you and other patients whose appointments are after yours.  Also, if you miss three or more appointments without notifying the office, you may be dismissed from the clinic at the provider's discretion.      For prescription refill requests, have your pharmacy contact our office and allow 72 hours for refills to be completed.    Today you received the following chemotherapy and/or immunotherapy agents imfinzi, gemzar, cisplatin   To help prevent nausea and vomiting after your treatment, we encourage you to take your nausea medication as directed.  BELOW ARE SYMPTOMS THAT SHOULD BE REPORTED IMMEDIATELY: . *FEVER GREATER THAN 100.4 F (38 C) OR HIGHER . *CHILLS OR SWEATING . *NAUSEA AND VOMITING THAT IS NOT CONTROLLED WITH YOUR NAUSEA MEDICATION . *UNUSUAL SHORTNESS OF BREATH . *UNUSUAL BRUISING OR BLEEDING . *URINARY PROBLEMS (pain or burning when urinating, or frequent urination) . *BOWEL PROBLEMS (unusual diarrhea, constipation, pain near the anus) . TENDERNESS IN MOUTH AND THROAT WITH OR WITHOUT PRESENCE OF ULCERS (sore throat, sores in mouth, or a toothache) . UNUSUAL RASH, SWELLING OR PAIN  . UNUSUAL VAGINAL DISCHARGE OR ITCHING   Items with * indicate a potential emergency and should be followed up as soon as possible or go to the Emergency Department if any problems should occur.  Please show the CHEMOTHERAPY ALERT CARD or  IMMUNOTHERAPY ALERT CARD at check-in to the Emergency Department and triage nurse.  Should you have questions after your visit or need to cancel or reschedule your appointment, please contact [facility]  Dept: [phone]  and follow the prompts.  Office hours are 8:00 a.m. to 4:30 p.m. Monday - Friday. Please note that voicemails left after 4:00 p.m. may not be returned until the following business day.  We are closed weekends and major holidays. You have access to a nurse at all times for urgent questions. Please call the main number to the clinic Dept: [phone] and follow the prompts.   For any non-urgent questions, you may also contact your provider using MyChart. We now offer e-Visits for anyone 65 and older to request care online for non-urgent symptoms. For details visit [email or web address].   Also download the MyChart app! Go to the app store, search "MyChart", open the app, select [facility], and log in with your MyChart username and password.  Due to Covid, a mask is required upon entering the hospital/clinic. If you do not have a mask, one will be given to you upon arrival. For doctor visits, patients may have 1 support person aged 49 or older with them. For treatment visits, patients cannot have anyone with them due to current Covid guidelines and our immunocompromised population.

## 2020-09-07 NOTE — Progress Notes (Signed)
    SUBJECTIVE:   CHIEF COMPLAINT / HPI: HTN & R arm pain   Patient reports RUE pain that extends down from elbow. She  thinks pain may be worse with chemo sessions. Pain worse with cold temp. Pain moves down from right elbow to wrist. Denies that pain is like "electric shock". Pain is present intermittently for years. Does not radiate from shoulder but is in elbow and wrist joints more so. Usually has joint pain during rain. Has tried ibuprofen and it works better.   HTN  She was started on amlodipine 2.5mg . Patient reports she has not had her medication for today. She states that she has been taking the one 2.5mg  tablet daily and has refills. She reports tolerating that well. She does not check her BP at home.   PERTINENT  PMH / PSH: HCC   OBJECTIVE:   BP 139/80   Pulse (!) 107   Ht 5' 4.17" (1.63 m)   Wt 144 lb 6.4 oz (65.5 kg)   SpO2 100%   BMI 24.65 kg/m   General: female appearing stated age in no acute distress Cardio: Normal S1 and S2, no S3 or S4. Rhythm is regular Pulm: Clear to auscultation bilaterally, no crackles, wheezing, or diminished breath sounds. Normal respiratory effort Extremities: RUE without muscle atrophy, no visual ecchymosis or edema noted, 5/5 strength in bilateral upper extremities, no joint deformities, patient demonstrates normal ROM, no crepitus, mild tenderness of elbow and wrist    ASSESSMENT/PLAN:   HTN (hypertension) Goal <140/90. BP mildly elevated compared to last visit. Patient to continue amlodipine 2.5mg  daily  Recent blood work with oncology does not show any electrolyte abnormalities   Osteoarthritis of right forearm Pain primarily in right elbow and wrist that seems to be consistent with OA, present intermittently, worse with changes in weather/cold temperatures and relieved with ibuprofen. Exam is not notable for red flag findings. Patient denies neuropathic pain and hx does not fit fracture concern  - prescribed topical voltaren gel   - creatinine <1 on last check       [name], MD [facility]

## 2020-09-07 NOTE — Progress Notes (Signed)
Give full dose of chemotherapy today as lab's look good per Dr Burr Medico.  T.O. Dr Lavonda Jumbo, PharmD

## 2020-09-08 ENCOUNTER — Ambulatory Visit
Admission: RE | Admit: 2020-09-08 | Discharge: 2020-09-08 | Disposition: A | Discharge status: Home / Self Care | Payer: 59 | Source: Ambulatory Visit | Attending: Family Medicine | Admitting: Family Medicine

## 2020-09-08 ENCOUNTER — Other Ambulatory Visit: Payer: Self-pay

## 2020-09-08 ENCOUNTER — Inpatient Hospital Stay: Payer: 59 | Attending: Hematology

## 2020-09-08 ENCOUNTER — Ambulatory Visit: Payer: 59 | Admitting: Family Medicine

## 2020-09-08 ENCOUNTER — Encounter: Payer: Self-pay | Admitting: Family Medicine

## 2020-09-08 VITALS — BP 126/93 | HR 102 | Temp 98.3°F | Resp 20

## 2020-09-08 DIAGNOSIS — Z800 Family history of malignant neoplasm of digestive organs: Secondary | ICD-10-CM | POA: Insufficient documentation

## 2020-09-08 DIAGNOSIS — M19031 Primary osteoarthritis, right wrist: Secondary | ICD-10-CM | POA: Diagnosis not present

## 2020-09-08 DIAGNOSIS — Z5189 Encounter for other specified aftercare: Secondary | ICD-10-CM | POA: Insufficient documentation

## 2020-09-08 DIAGNOSIS — K219 Gastro-esophageal reflux disease without esophagitis: Secondary | ICD-10-CM | POA: Diagnosis not present

## 2020-09-08 DIAGNOSIS — Z5111 Encounter for antineoplastic chemotherapy: Secondary | ICD-10-CM | POA: Insufficient documentation

## 2020-09-08 DIAGNOSIS — Z79899 Other long term (current) drug therapy: Secondary | ICD-10-CM | POA: Insufficient documentation

## 2020-09-08 DIAGNOSIS — G893 Neoplasm related pain (acute) (chronic): Secondary | ICD-10-CM | POA: Insufficient documentation

## 2020-09-08 DIAGNOSIS — I10 Essential (primary) hypertension: Secondary | ICD-10-CM | POA: Insufficient documentation

## 2020-09-08 DIAGNOSIS — R918 Other nonspecific abnormal finding of lung field: Secondary | ICD-10-CM | POA: Insufficient documentation

## 2020-09-08 DIAGNOSIS — R5383 Other fatigue: Secondary | ICD-10-CM | POA: Insufficient documentation

## 2020-09-08 DIAGNOSIS — C221 Intrahepatic bile duct carcinoma: Secondary | ICD-10-CM | POA: Insufficient documentation

## 2020-09-08 DIAGNOSIS — N939 Abnormal uterine and vaginal bleeding, unspecified: Secondary | ICD-10-CM

## 2020-09-08 LAB — T4: T4, Total: 8 ug/dL (ref 4.5–12.0)

## 2020-09-08 MED ORDER — PEGFILGRASTIM INJECTION 6 MG/0.6ML ~~LOC~~
6.0000 mg | PREFILLED_SYRINGE | Freq: Once | SUBCUTANEOUS | Status: AC
  Administered 2020-09-08: 6 mg via SUBCUTANEOUS
  Filled 2020-09-08: qty 0.6

## 2020-09-08 MED ORDER — PEGFILGRASTIM 6 MG/0.6ML ~~LOC~~ PSKT
PREFILLED_SYRINGE | SUBCUTANEOUS | Status: AC
  Filled 2020-09-08: qty 0.6

## 2020-09-08 MED ORDER — DICLOFENAC SODIUM 1 % EX GEL: 4.0000 g | Freq: Four times a day (QID) | CUTANEOUS | 2 refills | Status: DC

## 2020-09-08 NOTE — Patient Instructions (Signed)
I have prescribed a gel for you to use on your arm to help with your pain that is likely due to arthritis.  I have attached some exercises to help with the pain as well below.   Please plan to follow up with me in 2-3 weeks for blood pressure and arm pain.    Distal Biceps Tendinitis Rehab Ask your health care provider which exercises are safe for you. Do exercises exactly as told by your health care provider and adjust them as directed. It is normal to feel mild stretching, pulling, tightness, or discomfort as you do these exercises. Stop right away if you feel sudden pain or your pain gets worse. Do not begin these exercises until told by your health care provider. Stretching and range-of-motion exercises  Strengthening exercises These exercises build strength and endurance in your arm and shoulder. Endurance is the ability to use your muscles for a long time, even after they get tired. Forearm rotation, supination 1. Sit with your left / right forearm supported on a table. Your elbow should be at waist height. 2. Gently grasp a lightweight hammer near the head. As this exercise gets easier for you, try holding the hammer farther down the handle. 3. Rest your hand over the edge of the table, palm down. 4. Without moving your left / right elbow, slowly rotate your palm up (supination), stopping when your thumb is pointed toward the ceiling. 5. Hold this position for__________ seconds. 6. Slowly return to the starting position. Repeat __________ times. Complete this exercise __________ times a day.   Forearm rotation, pronation 1. Sit with your left / right forearm supported on a table. Your elbow should be at waist height. 2. Gently grasp a lightweight hammer near the head. As this exercise gets easier for you, try holding the hammer farther down the handle. 3. Rest your hand over the edge of the table, palm up. 4. Without moving your left / right elbow, slowly rotate your palm down  (pronation), stopping when your thumb is pointed toward the floor. 5. Hold this position for __________ seconds. 6. Slowly return to the starting position. Repeat __________ times. Complete this exercise __________ times a day.   Biceps curls 1. Sit on a stable chair without armrests, or stand up. 2. If directed, hold a __________ weight in your left / right hand, or hold an exercise band with both hands. Your palms should face up toward the ceiling at the starting position. 3. Bend your left / right elbow and move your hand up toward your shoulder. Keep your other arm straight down, in the starting position. 4. Slowly return to the starting position. Repeat __________ times. Complete this exercise __________ times a day.   Elbow extension, supine 1. Lie on your back (supine position). 2. Hold a __________ weight in your left / right hand. 3. Bend your left / right elbow to a 90-degree angle (right angle) so the weight is in front of your face, over your chest, and your elbow is pointed up to the ceiling. 4. Straighten your elbow, raising your hand toward the ceiling (extension). Use your other hand to support your left / right upper arm and to keep it still. 5. Slowly return to the starting position. Repeat __________ times. Complete this exercise __________ times a day.   This information is not intended to replace advice given to you by your health care provider. Make sure you discuss any questions you have with your health care provider. Document  Revised: 07/23/2018 Document Reviewed: 03/28/2018 Elsevier Patient Education  2021 [employer].

## 2020-09-08 NOTE — Patient Instructions (Signed)
Pegfilgrastim injection What is this medicine? PEGFILGRASTIM (PEG fil gra stim) is a long-acting granulocyte colony-stimulating factor that stimulates the growth of neutrophils, a type of white blood cell important in the body's fight against infection. It is used to reduce the incidence of fever and infection in patients with certain types of cancer who are receiving chemotherapy that affects the bone marrow, and to increase survival after being exposed to high doses of radiation. This medicine may be used for other purposes; ask your health care provider or pharmacist if you have questions. COMMON BRAND NAME(S): Fulphila, Neulasta, Nyvepria, UDENYCA, Ziextenzo What should I tell my health care provider before I take this medicine? They need to know if you have any of these conditions:  kidney disease  latex allergy  ongoing radiation therapy  sickle cell disease  skin reactions to acrylic adhesives (On-Body Injector only)  an unusual or allergic reaction to pegfilgrastim, filgrastim, other medicines, foods, dyes, or preservatives  pregnant or trying to get pregnant  breast-feeding How should I use this medicine? This medicine is for injection under the skin. If you get this medicine at home, you will be taught how to prepare and give the pre-filled syringe or how to use the On-body Injector. Refer to the patient Instructions for Use for detailed instructions. Use exactly as directed. Tell your healthcare provider immediately if you suspect that the On-body Injector may not have performed as intended or if you suspect the use of the On-body Injector resulted in a missed or partial dose. It is important that you put your used needles and syringes in a special sharps container. Do not put them in a trash can. If you do not have a sharps container, call your pharmacist or healthcare provider to get one. Talk to your pediatrician regarding the use of this medicine in children. While this drug  may be prescribed for selected conditions, precautions do apply. Overdosage: If you think you have taken too much of this medicine contact a poison control center or emergency room at once. NOTE: This medicine is only for you. Do not share this medicine with others. What if I miss a dose? It is important not to miss your dose. Call your doctor or health care professional if you miss your dose. If you miss a dose due to an On-body Injector failure or leakage, a new dose should be administered as soon as possible using a single prefilled syringe for manual use. What may interact with this medicine? Interactions have not been studied. This list may not describe all possible interactions. Give your health care provider a list of all the medicines, herbs, non-prescription drugs, or dietary supplements you use. Also tell them if you smoke, drink alcohol, or use illegal drugs. Some items may interact with your medicine. What should I watch for while using this medicine? Your condition will be monitored carefully while you are receiving this medicine. You may need blood work done while you are taking this medicine. Talk to your health care provider about your risk of cancer. You may be more at risk for certain types of cancer if you take this medicine. If you are going to need a MRI, CT scan, or other procedure, tell your doctor that you are using this medicine (On-Body Injector only). What side effects may I notice from receiving this medicine? Side effects that you should report to your doctor or health care professional as soon as possible:  allergic reactions (skin rash, itching or hives, swelling of   the face, lips, or tongue)  back pain  dizziness  fever  pain, redness, or irritation at site where injected  pinpoint red spots on the skin  red or dark-brown urine  shortness of breath or breathing problems  stomach or side pain, or pain at the shoulder  swelling  tiredness  trouble  passing urine or change in the amount of urine  unusual bruising or bleeding Side effects that usually do not require medical attention (report to your doctor or health care professional if they continue or are bothersome):  bone pain  muscle pain This list may not describe all possible side effects. Call your doctor for medical advice about side effects. You may report side effects to FDA at 1-800-FDA-1088. Where should I keep my medicine? Keep out of the reach of children. If you are using this medicine at home, you will be instructed on how to store it. Throw away any unused medicine after the expiration date on the label. NOTE: This sheet is a summary. It may not cover all possible information. If you have questions about this medicine, talk to your doctor, pharmacist, or health care provider.  2021 Elsevier/Gold Standard (2019-04-18 13:20:51)  

## 2020-09-09 ENCOUNTER — Other Ambulatory Visit: Payer: Self-pay | Admitting: Hematology

## 2020-09-09 DIAGNOSIS — C221 Intrahepatic bile duct carcinoma: Secondary | ICD-10-CM

## 2020-09-09 DIAGNOSIS — I10 Essential (primary) hypertension: Secondary | ICD-10-CM | POA: Insufficient documentation

## 2020-09-09 DIAGNOSIS — M19031 Primary osteoarthritis, right wrist: Secondary | ICD-10-CM | POA: Insufficient documentation

## 2020-09-09 MED ORDER — MAGNESIUM OXIDE -MG SUPPLEMENT 400 (240 MG) MG PO TABS: 400.0000 mg | ORAL_TABLET | Freq: Every day | ORAL | 3 refills | Status: DC

## 2020-09-09 NOTE — Assessment & Plan Note (Signed)
Pain primarily in right elbow and wrist that seems to be consistent with OA, present intermittently, worse with changes in weather/cold temperatures and relieved with ibuprofen. Exam is not notable for red flag findings. Patient denies neuropathic pain and hx does not fit fracture concern  - prescribed topical voltaren gel  - creatinine <1 on last check

## 2020-09-09 NOTE — Assessment & Plan Note (Signed)
Goal <140/90. BP mildly elevated compared to last visit. Patient to continue amlodipine 2.5mg  daily  Recent blood work with oncology does not show any electrolyte abnormalities

## 2020-09-10 ENCOUNTER — Telehealth: Payer: Self-pay

## 2020-09-10 NOTE — Telephone Encounter (Signed)
I contacted [name] services at [phone] and spoke with Interpreter # [phone]. We were able to speak with Crystal Gibbs per DPR to advise him that [name] states that she refilled the patient's magnesium but that her potassium has been normal so for now the patient should stop taking the potassium. [name] verbalized understanding and states that he will inform the patient.

## 2020-09-14 ENCOUNTER — Other Ambulatory Visit: Payer: 59

## 2020-09-14 ENCOUNTER — Encounter: Payer: 59 | Admitting: Nutrition

## 2020-09-14 ENCOUNTER — Ambulatory Visit: Payer: 59

## 2020-09-15 ENCOUNTER — Encounter: Payer: Self-pay | Admitting: Genetic Counselor

## 2020-09-15 ENCOUNTER — Ambulatory Visit: Payer: 59

## 2020-09-15 ENCOUNTER — Encounter: Payer: 59 | Admitting: Nutrition

## 2020-09-15 ENCOUNTER — Telehealth: Payer: Self-pay | Admitting: Genetic Counselor

## 2020-09-15 DIAGNOSIS — Z1379 Encounter for other screening for genetic and chromosomal anomalies: Secondary | ICD-10-CM | POA: Insufficient documentation

## 2020-09-15 NOTE — Telephone Encounter (Signed)
Called Ms. Topor in attempt to disclose results of genetic testing.  Home phone number listed [phone]) was disconnected.  Called mobile number, which is husband's cell phone number.  Husband not with Ms. Petersen at time of call.  Provided her direct line [phone]).  LVM with assistance of interpreter [place], [phone]) stated that would try to call back in a couple days to discuss results of genetic testing.

## 2020-09-16 ENCOUNTER — Encounter: Payer: Self-pay | Admitting: General Practice

## 2020-09-16 NOTE — Progress Notes (Signed)
[place] CSW Progress Notes  Call from niece, patient is receiving bills from [facility] and  is concerned about cost of care.  She is concerned that patient is losing her [place] coverage before she will have Medicaid.  CSW stressed the importance of following the process of applying for [place] at [place] and for disability w the help of [facility].  She has been referred to [facility] - per niece, she has an appointment with disability next week.  Provided online portal for Medicaid application as per niece, when she went to DSS to apply for Medicaid she was told to apply for disability first.  Patient has also been contacted by [employer] from [employer] for [employer] who reinforced the process of applying for disability and Medicaid per procedures.    Edwyna Shell, LCSW Clinical Social Worker Phone:  [phone]

## 2020-09-16 NOTE — Progress Notes (Signed)
[place] CSW Progress Notes  Secure email from [facility] re patient's disability application - "Her claim was filed on 08/29/2020. I suggest she call [place] [phone] in case there is an issue. I know she dropped of the signature packet that came. We did flag her case TERI as it is a compassionate allowance claim. It's possible that it is hung up at [facility] and hasn't moved forward to [place]. If they have tried to reach her and she is not responding, they may not reach out to [place]."  Patient's niece informed by phone of the above and encouraged to help patient contact [place].  Edwyna Shell, LCSW Clinical Social Worker Phone:  [phone]

## 2020-09-20 ENCOUNTER — Other Ambulatory Visit: Payer: Self-pay

## 2020-09-20 ENCOUNTER — Inpatient Hospital Stay: Payer: 59

## 2020-09-20 ENCOUNTER — Inpatient Hospital Stay: Payer: 59 | Admitting: Nutrition

## 2020-09-20 ENCOUNTER — Inpatient Hospital Stay (HOSPITAL_BASED_OUTPATIENT_CLINIC_OR_DEPARTMENT_OTHER): Payer: 59 | Admitting: Hematology

## 2020-09-20 VITALS — BP 121/89 | HR 100 | Temp 98.3°F | Resp 20 | Wt 142.0 lb

## 2020-09-20 DIAGNOSIS — Z95828 Presence of other vascular implants and grafts: Secondary | ICD-10-CM

## 2020-09-20 DIAGNOSIS — Z5111 Encounter for antineoplastic chemotherapy: Secondary | ICD-10-CM | POA: Diagnosis not present

## 2020-09-20 DIAGNOSIS — C221 Intrahepatic bile duct carcinoma: Secondary | ICD-10-CM

## 2020-09-20 LAB — CMP (CANCER CENTER ONLY)
ALT: 15 U/L (ref 0–44)
AST: 28 U/L (ref 15–41)
Albumin: 3.1 g/dL — ABNORMAL LOW (ref 3.5–5.0)
Alkaline Phosphatase: 279 U/L — ABNORMAL HIGH (ref 38–126)
Anion gap: 9 (ref 5–15)
BUN: 10 mg/dL (ref 6–20)
CO2: 26 mmol/L (ref 22–32)
Calcium: 9.3 mg/dL (ref 8.9–10.3)
Chloride: 102 mmol/L (ref 98–111)
Creatinine: 0.6 mg/dL (ref 0.44–1.00)
GFR, Estimated: >60 mL/min (ref >60)
Glucose, Bld: 191 mg/dL — ABNORMAL HIGH (ref 70–99)
Potassium: 4 mmol/L (ref 3.5–5.1)
Sodium: 137 mmol/L (ref 135–145)
Total Bilirubin: 0.3 mg/dL (ref 0.3–1.2)
Total Protein: 7.3 g/dL (ref 6.5–8.1)

## 2020-09-20 LAB — CBC WITH DIFFERENTIAL (CANCER CENTER ONLY)
Abs Immature Granulocytes: 0.08 10*3/uL — ABNORMAL HIGH (ref 0.00–0.07)
Basophils Absolute: 0 10*3/uL (ref 0.0–0.1)
Basophils Relative: 0 %
Eosinophils Absolute: 0.1 10*3/uL (ref 0.0–0.5)
Eosinophils Relative: 1 %
HCT: 33.9 % — ABNORMAL LOW (ref 36.0–46.0)
Hemoglobin: 11.2 g/dL — ABNORMAL LOW (ref 12.0–15.0)
Immature Granulocytes: 1 %
Lymphocytes Relative: 28 %
Lymphs Abs: 2.6 10*3/uL (ref 0.7–4.0)
MCH: 28.8 pg (ref 26.0–34.0)
MCHC: 33 g/dL (ref 30.0–36.0)
MCV: 87.1 fL (ref 80.0–100.0)
Monocytes Absolute: 0.7 10*3/uL (ref 0.1–1.0)
Monocytes Relative: 8 %
Neutro Abs: 5.6 10*3/uL (ref 1.7–7.7)
Neutrophils Relative %: 62 %
Platelet Count: 214 10*3/uL (ref 150–400)
RBC: 3.89 MIL/uL (ref 3.87–5.11)
RDW: 16.1 % — ABNORMAL HIGH (ref 11.5–15.5)
WBC Count: 9.1 10*3/uL (ref 4.0–10.5)
nRBC: 0 % (ref 0.0–0.2)

## 2020-09-20 LAB — MAGNESIUM: Magnesium: 1.8 mg/dL (ref 1.7–2.4)

## 2020-09-20 LAB — PREGNANCY, URINE: Preg Test, Ur: NEGATIVE

## 2020-09-20 LAB — TSH: TSH: 2.516 u[IU]/mL (ref 0.308–3.960)

## 2020-09-20 MED ORDER — HEPARIN SOD (PORK) LOCK FLUSH 100 UNIT/ML IV SOLN
500.0000 [IU] | Freq: Once | INTRAVENOUS | Status: AC | PRN
  Administered 2020-09-20: 500 [IU]
  Filled 2020-09-20: qty 5

## 2020-09-20 MED ORDER — SODIUM CHLORIDE 0.9% FLUSH
10.0000 mL | Freq: Once | INTRAVENOUS | Status: AC
  Administered 2020-09-20: 10 mL
  Filled 2020-09-20: qty 10

## 2020-09-20 MED ORDER — SODIUM CHLORIDE 0.9 % IV SOLN
20.0000 mg/m2 | Freq: Once | INTRAVENOUS | Status: AC
  Administered 2020-09-20: 34 mg via INTRAVENOUS
  Filled 2020-09-20: qty 34

## 2020-09-20 MED ORDER — SODIUM CHLORIDE 0.9% FLUSH
10.0000 mL | INTRAVENOUS | Status: DC | PRN
  Administered 2020-09-20: 10 mL
  Filled 2020-09-20: qty 10

## 2020-09-20 MED ORDER — MAGNESIUM SULFATE 2 GM/50ML IV SOLN
INTRAVENOUS | Status: AC
  Filled 2020-09-20: qty 50

## 2020-09-20 MED ORDER — FOSAPREPITANT DIMEGLUMINE INJECTION 150 MG
150.0000 mg | Freq: Once | INTRAVENOUS | Status: AC
  Administered 2020-09-20: 150 mg via INTRAVENOUS
  Filled 2020-09-20: qty 150

## 2020-09-20 MED ORDER — SODIUM CHLORIDE 0.9 % IV SOLN
Freq: Once | INTRAVENOUS | Status: AC
Start: 2020-09-20 — End: 2020-09-20
  Filled 2020-09-20: qty 250

## 2020-09-20 MED ORDER — PALONOSETRON HCL INJECTION 0.25 MG/5ML
INTRAVENOUS | Status: AC
  Filled 2020-09-20: qty 5

## 2020-09-20 MED ORDER — SODIUM CHLORIDE 0.9 % IV SOLN
800.0000 mg/m2 | Freq: Once | INTRAVENOUS | Status: AC
  Administered 2020-09-20: 1368 mg via INTRAVENOUS
  Filled 2020-09-20: qty 35.98

## 2020-09-20 MED ORDER — MAGNESIUM SULFATE 2 GM/50ML IV SOLN
2.0000 g | Freq: Once | INTRAVENOUS | Status: AC
  Administered 2020-09-20: 2 g via INTRAVENOUS

## 2020-09-20 MED ORDER — POTASSIUM CHLORIDE IN NACL 20-0.9 MEQ/L-% IV SOLN
Freq: Once | INTRAVENOUS | Status: AC
  Filled 2020-09-20: qty 1000

## 2020-09-20 MED ORDER — SODIUM CHLORIDE 0.9 % IV SOLN
Freq: Once | INTRAVENOUS | Status: AC
  Filled 2020-09-20: qty 250

## 2020-09-20 MED ORDER — ACETAMINOPHEN 325 MG PO TABS
ORAL_TABLET | ORAL | Status: AC
  Filled 2020-09-20: qty 2

## 2020-09-20 MED ORDER — PALONOSETRON HCL INJECTION 0.25 MG/5ML
0.2500 mg | Freq: Once | INTRAVENOUS | Status: AC
  Administered 2020-09-20: 0.25 mg via INTRAVENOUS

## 2020-09-20 MED ORDER — ACETAMINOPHEN 325 MG PO TABS
650.0000 mg | ORAL_TABLET | Freq: Four times a day (QID) | ORAL | Status: DC | PRN
  Administered 2020-09-20: 650 mg via ORAL

## 2020-09-20 NOTE — Progress Notes (Signed)
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 Weight documented as 142 pounds on June 13, stable from 142.8 pounds May 10.  Noted fluctuations between 135 pounds and 144 pounds. Noted labs: Glucose 191, albumin 3.1, magnesium 1.8.  Patient reports she generally is drinking 1 Ensure a day.  This has been primarily high-protein Ensure.  She eats yogurt every morning for breakfast.  She denies other nutrition impact symptoms presently.  Nutrition diagnosis: Food and nutrition related knowledge deficit improved.  Intervention: Continue smaller more frequent meals and snacks with higher calorie, higher protein foods as tolerated. Continue to monitor nausea and take medications as needed. Change oral nutrition supplement to ensure complete and consume 1-2 cartons daily. Questions were answered.  Teach back method used.  Monitoring, evaluation, goals: Patient will tolerate adequate calories and protein to promote weight stabilization.  Follow-up to be scheduled as needed.  Patient has contact information for further questions and concerns.  **Disclaimer: This note was dictated with voice recognition software. Similar sounding words can inadvertently be transcribed and this note may contain transcription errors which may not have been corrected upon publication of note.**

## 2020-09-20 NOTE — Patient Instructions (Signed)
[facility]  Discharge Instructions: Thank you for choosing [facility] to provide your oncology and hematology care.   If you have a lab appointment with the Cancer Center, please go directly to the Cancer Center and check in at the registration area.   Wear comfortable clothing and clothing appropriate for easy access to any Portacath or PICC line.   We strive to give you quality time with your provider. You may need to reschedule your appointment if you arrive late (15 or more minutes).  Arriving late affects you and other patients whose appointments are after yours.  Also, if you miss three or more appointments without notifying the office, you may be dismissed from the clinic at the provider's discretion.      For prescription refill requests, have your pharmacy contact our office and allow 72 hours for refills to be completed.    Today you received the following chemotherapy and/or immunotherapy agents : Cisplatin, Gemzar    To help prevent nausea and vomiting after your treatment, we encourage you to take your nausea medication as directed.  BELOW ARE SYMPTOMS THAT SHOULD BE REPORTED IMMEDIATELY: *FEVER GREATER THAN 100.4 F (38 C) OR HIGHER *CHILLS OR SWEATING *NAUSEA AND VOMITING THAT IS NOT CONTROLLED WITH YOUR NAUSEA MEDICATION *UNUSUAL SHORTNESS OF BREATH *UNUSUAL BRUISING OR BLEEDING *URINARY PROBLEMS (pain or burning when urinating, or frequent urination) *BOWEL PROBLEMS (unusual diarrhea, constipation, pain near the anus) TENDERNESS IN MOUTH AND THROAT WITH OR WITHOUT PRESENCE OF ULCERS (sore throat, sores in mouth, or a toothache) UNUSUAL RASH, SWELLING OR PAIN  UNUSUAL VAGINAL DISCHARGE OR ITCHING   Items with * indicate a potential emergency and should be followed up as soon as possible or go to the Emergency Department if any problems should occur.  Please show the CHEMOTHERAPY ALERT CARD or IMMUNOTHERAPY ALERT CARD at  check-in to the Emergency Department and triage nurse.  Should you have questions after your visit or need to cancel or reschedule your appointment, please contact [facility]  Dept: [phone]  and follow the prompts.  Office hours are 8:00 a.m. to 4:30 p.m. Monday - Friday. Please note that voicemails left after 4:00 p.m. may not be returned until the following business day.  We are closed weekends and major holidays. You have access to a nurse at all times for urgent questions. Please call the main number to the clinic Dept: [phone] and follow the prompts.   For any non-urgent questions, you may also contact your provider using MyChart. We now offer e-Visits for anyone 18 and older to request care online for non-urgent symptoms. For details visit [email or web address].   Also download the MyChart app! Go to the app store, search "MyChart", open the app, select [facility], and log in with your MyChart username and password.  Due to Covid, a mask is required upon entering the hospital/clinic. If you do not have a mask, one will be given to you upon arrival. For doctor visits, patients may have 1 support person aged 18 or older with them. For treatment visits, patients cannot have anyone with them due to current Covid guidelines and our immunocompromised population.   

## 2020-09-20 NOTE — Progress Notes (Signed)
[name]   Telephone:([phone] [phone]   Clinic Follow up Note   Patient Care Team: [name], MD as PCP - General (Family Medicine) [name], MD as Consulting Physician (Oncology) [name], RN as Oncology Nurse Navigator  Date of Service:  09/20/2020  CHIEF COMPLAINT: F/u of intrahepatic cholangiocarcinoma   SUMMARY OF ONCOLOGIC HISTORY: Oncology History Overview Note  Cancer Staging Intrahepatic cholangiocarcinoma (Crystal Gibbs) Staging form: Intrahepatic Bile Duct, AJCC 8th Edition - Clinical: Stage IB (cT1b, cN0, cM0) - Signed by [name], MD on 08/04/2020    Intrahepatic cholangiocarcinoma (Crystal Gibbs)  08/19/2019 Procedure   Upper Endoscopy by Dr [name]  IMPRESSION - Normal esophagus. - Z-line regular, 35 cm from the incisors. - Gastritis. Biopsied. - Normal incisura, antrum and pylorus. - Erythematous duodenopathy. Biopsied. - Normal second portion of the duodenum and third portion of the duodenum. - No evidence of primary malignancy site noted on this study.   07/10/2020 Imaging   US Abdomen 07/10/20 IMPRESSION: 1.  No acute hepatobiliary findings.   2. Heterogeneous liver echotexture with 3 masslike areas as described measuring 4.3 cm, 5.4 cm and 6.4 cm respectively. Recommend CT abdomen/pelvis with intravenous contrast for further evaluation.   07/10/2020 Imaging   CT AP 07/10/20  IMPRESSION: 1. There is a large masslike collection or mass in the central liver measuring at least 10.9 x 7.1 x 10.2 cm with 2 smaller adjacent satellite lesions/collections measuring 11 and 17 mm. These findings may represent a large abscess with satellite abscesses or a large neoplasm/malignancy. Recommend MRI for further evaluation. 2. Left renal cyst. 3. Calcified atherosclerosis in the distal abdominal aorta is mild. 4. Fibroid uterus.   07/10/2020 Imaging   MRI Abdomen 07/10/20  IMPRESSION: 1. The large central hepatic mass does  not demonstrate signal characteristics or enhancement typical of a hemangioma or abscess, and is suspicious for malignancy. There is at least 1 satellite lesion centrally in the right lobe. As there are no signs of underlying cirrhosis, primary considerations include peripheral cholangiocarcinoma and metastatic disease. Tissue sampling recommended. 2. Mild intrahepatic biliary dilatation within the left hepatic lobe. Distortion of the hepatic vasculature without evidence of tumor thrombus. 3. No extrahepatic metastatic disease or primary malignancy identified within the abdomen.   07/26/2020 Imaging   CT AP 07/26/20  IMPRESSION: 1. Markedly poorly visualized and ill-defined 12 cm lesion within the liver. This lesion is better evaluated on MR abdomen 07/10/2020. 2. Otherwise no acute intra-abdominal abnormality with markedly limited evaluation on this noncontrast study. 3.  Aortic Atherosclerosis (ICD10-I70.0).   07/26/2020 Initial Diagnosis   FINAL MICROSCOPIC DIAGNOSIS: 07/26/20  A. LIVER MASS, LEFT, NEEDLE CORE BIOPSY:  - Adenocarcinoma.  - See comment.  COMMENT:  Immunohistochemistry will be performed and reported as an addendum.    07/30/2020 Imaging   CT Chest 07/30/20  IMPRESSION: 1. Pulmonary nodules are highly worrisome for metastatic disease. 2. Hepatic masses are consistent with biopsy-proven adenocarcinoma and better evaluated on MR abdomen 07/10/2020.   08/04/2020 Initial Diagnosis   Intrahepatic cholangiocarcinoma (Crystal Gibbs)    08/04/2020 Cancer Staging   Staging form: Intrahepatic Bile Duct, AJCC 8th Edition - Clinical: Stage IB (cT1b, cN0, cM0) - Signed by [name], MD on 08/04/2020    08/13/2020 Procedure   PAC placement   08/16/2020 -  Chemotherapy   First line gemcitabine and cisplatin 2 weeks on/1 week off starting 08/16/20     08/16/2020 -  Chemotherapy   -Addition of durvalumab q3weeks with first-line chemo  starting 08/16/20    08/18/2020 Procedure   Upper  Endoscopy by Dr [name] IMPRESSION - Normal esophagus. - Z-line regular, 35 cm from the incisors. - Gastritis. Biopsied. - Normal incisura, antrum and pylorus. - Erythematous duodenopathy. Biopsied. - Normal second portion of the duodenum and third portion of the duodenum. - No evidence of primary malignancy site noted on this study.    FINAL MICROSCOPIC DIAGNOSIS:   A. DUODENUM, BULB, BIOPSY:  -  Erosive and reactive duodenal mucosa with Brunner's gland hyperplasia  and mixed inflammation  -  No dysplasia or malignancy identified   B. STOMACH, BIOPSY:  -  Chronic active gastritis  -  H. pylori organisms present  -  No intestinal metaplasia identified  -  See comment   COMMENT:   A.  Dr. [name] reviewed the case and agrees with the above diagnosis.   B.  Warthin-Starry stain is POSITIVE for organisms consistent with  Helicobacter pylori.    09/08/2020 Genetic Testing   Negative hereditary cancer genetic testing: no pathogenic variants detected in [facility] Multi-Cancer Panel + Pancreatitis Genes.  Variants of uncertain significance detected in RECQL4 at c.119-20C>A (Intronic) and SDHA at c.1039A>G (p.Met347Val).  The report date is September 08, 2020.    The Multi-Cancer Panel with pancreatitis genes offered by [facility] includes sequencing and/or deletion duplication testing of the following 89 genes: AIP, ALK, APC, ATM, AXIN2,BAP1,  BARD1, BLM, BMPR1A, BRCA1, BRCA2, BRIP1, CASR, CDC73, CDH1, CDK4, CDKN1B, CDKN1C, CDKN2A (p14ARF), CDKN2A (p16INK4a), CEBPA, CFTR, CHEK2, CPA1, CTNNA1, CTRC, DICER1, DIS3L2, EGFR (c.2369C>T, p.Thr790Met variant only), EPCAM (Deletion/duplication testing only), FH, FLCN, GATA2, GPC3, GREM1 (Promoter region deletion/duplication testing only), HOXB13 (c.251G>A, p.Gly84Glu), HRAS, KIT, MAX, MEN1, MET, MITF (c.952G>A, p.Glu318Lys variant only), MLH1, MSH2, MSH3, MSH6, MUTYH, NBN, NF1, NF2, NTHL1, PALB2, PDGFRA, PHOX2B, PMS2, POLD1, POLE, POT1, PRKAR1A, PRSS1,  PTCH1, PTEN, RAD50, RAD51C, RAD51D, RB1, RECQL4, RET, RNF43, RUNX1, SDHAF2, SDHA (sequence changes only), SDHB, SDHC, SDHD, SMAD4, SMARCA4, SMARCB1, SMARCE1, SPINK1, STK11, SUFU, TERC, TERT, TMEM127, TP53, TSC1, TSC2, VHL, WRN and WT1.       CURRENT THERAPY:  First line gemcitabine and cisplatin 2 weeks on/1 week off starting 08/16/09 -Addition of Durvalumab q3weeks starting 08/16/20   INTERVAL HISTORY:  Joanie Duprey is here for a follow up. She was last seen by me 09/07/20. She presents to the clinic with her interpretor. She notes she tolerated her first cycle and able to recover on her week off. She notes when she is up or trying to walk she will feel bad. She notes she doe snot feel comfortable sitting, she rather lay down. She notes she has pain in LUQ after eating. She notes she takes Tramadol occasionally, but will also use Tylenol.    REVIEW OF SYSTEMS:   Constitutional: Denies fevers, chills or abnormal weight loss (+) Fatigue and weakness  Eyes: Denies blurriness of vision Ears, nose, mouth, throat, and face: Denies mucositis or sore throat Respiratory: Denies cough, dyspnea or wheezes Cardiovascular: Denies palpitation, chest discomfort or lower extremity swelling Gastrointestinal:  Denies nausea, heartburn or change in bowel habits (+) LUQ pain, postprandial  Skin: Denies abnormal skin rashes Lymphatics: Denies new lymphadenopathy or easy bruising Neurological:Denies numbness, tingling or new weaknesses Behavioral/Psych: Mood is stable, no new changes  All other systems were reviewed with the patient and are negative.  MEDICAL HISTORY:  Past Medical History:  Diagnosis Date   Cancer [facility])    Family history of breast cancer 08/23/2020   Family history of pancreatic cancer  08/23/2020   Family history of prostate cancer 08/23/2020   Family history of stomach cancer 08/23/2020   Fibroid    History of multiple miscarriages    x 3   Hypertension     SURGICAL HISTORY: Past  Surgical History:  Procedure Laterality Date   BIOPSY  08/18/2020   Procedure: BIOPSY;  Surgeon: Lavena Bullion, DO;  Location: [facility];  Service: Gastroenterology;;   CATARACT EXTRACTION Bilateral    ESOPHAGOGASTRODUODENOSCOPY (EGD) WITH PROPOFOL N/A 08/18/2020   Procedure: ESOPHAGOGASTRODUODENOSCOPY (EGD) WITH PROPOFOL;  Surgeon: Lavena Bullion, DO;  Location: [facility];  Service: Gastroenterology;  Laterality: N/A;   IR IMAGING GUIDED PORT INSERTION  08/13/2020   PARATHYROIDECTOMY Right 02/19/15    I have reviewed the social history and family history with the patient and they are unchanged from previous note.  ALLERGIES:  has No Known Allergies.  MEDICATIONS:  Current Outpatient Medications  Medication Sig Dispense Refill   amLODipine (NORVASC) 2.5 MG tablet Take 1 tablet (2.5 mg total) by mouth daily. 90 tablet 1   dexamethasone (DECADRON) 4 MG tablet Take 2 tablets at breakfast for 3 days, starting the day after cisplatin (Patient not taking: Reported on 09/08/2020) 40 tablet 2   diclofenac Sodium (VOLTAREN) 1 % GEL Apply 4 g topically 4 (four) times daily. 50 g 2   ibuprofen (ADVIL) 200 MG tablet Take 400 mg by mouth every 6 (six) hours as needed for headache.     lidocaine-prilocaine (EMLA) cream Apply to affected area once (Patient taking differently: Apply 1 application topically daily as needed (port access).) 30 g 3   magnesium oxide (MAG-OX) 400 (240 Mg) MG tablet Take 1 tablet (400 mg total) by mouth daily. 30 tablet 3   ondansetron (ZOFRAN ODT) 4 MG disintegrating tablet Take 1-2 tablets (4-8 mg total) by mouth every 6 (six) hours as needed for nausea or vomiting. 30 tablet 1   ondansetron (ZOFRAN) 8 MG tablet Take 1 tablet (8 mg total) by mouth 2 (two) times daily as needed. Start on the third day after cisplatin chemotherapy. 30 tablet 1   potassium chloride SA (KLOR-CON) 20 MEQ tablet Take 1 tablet (20 mEq total) by mouth daily. 30 tablet 0   prochlorperazine  (COMPAZINE) 10 MG tablet Take 1 tablet (10 mg total) by mouth every 6 (six) hours as needed (Nausea or vomiting). 30 tablet 1   traMADol (ULTRAM) 50 MG tablet Take 1 tablet (50 mg total) by mouth every 6 (six) hours as needed. (Patient taking differently: Take 50 mg by mouth every 6 (six) hours as needed for moderate pain.) 30 tablet 0   No current facility-administered medications for this visit.   Facility-Administered Medications Ordered in Other Visits  Medication Dose Route Frequency Provider Last Rate Last Admin   0.9 %  sodium chloride infusion   Intravenous Once [name], MD       CISplatin (PLATINOL) 34 mg in sodium chloride 0.9 % 250 mL chemo infusion  20 mg/m2 (Treatment Plan Recorded) Intravenous Once [name], MD       fosaprepitant (EMEND) 150 mg in sodium chloride 0.9 % 145 mL IVPB  150 mg Intravenous Once [name], MD       gemcitabine (GEMZAR) 1,368 mg in sodium chloride 0.9 % 250 mL chemo infusion  800 mg/m2 (Treatment Plan Recorded) Intravenous Once [name], MD       heparin lock flush 100 unit/mL  500 Units Intracatheter Once PRN [name], MD  magnesium sulfate IVPB 2 g 50 mL  2 g Intravenous Once [name], MD 50 mL/hr at 09/20/20 1054 2 g at 09/20/20 1054   palonosetron (ALOXI) injection 0.25 mg  0.25 mg Intravenous Once [name], MD       sodium chloride flush (NS) 0.9 % injection 10 mL  10 mL Intracatheter PRN [name], MD        PHYSICAL EXAMINATION: ECOG PERFORMANCE STATUS: 3 - Symptomatic, >50% confined to bed  Vitals with BMI 09/20/2020  Height 5'4.173"  Weight   BMI   Systolic 827  Diastolic 89  Pulse 078    GENERAL:alert, no distress and comfortable SKIN: skin color, texture, turgor are normal, no rashes or significant lesions EYES: normal, Conjunctiva are pink and non-injected, sclera clear  NECK: supple, thyroid normal size, non-tender, without nodularity LYMPH:  no palpable lymphadenopathy in the cervical, axillary  LUNGS: clear to  auscultation and percussion with normal breathing effort HEART: regular rate & rhythm and no murmurs and no lower extremity edema ABDOMEN:abdomen soft, non-tender and normal bowel sounds (+) hepatomegaly  Musculoskeletal:no cyanosis of digits and no clubbing  NEURO: alert & oriented x 3 with fluent speech, no focal motor/sensory deficits  LABORATORY DATA:  I have reviewed the data as listed CBC Latest Ref Rng & Units 09/20/2020 09/07/2020 08/30/2020  WBC 4.0 - 10.5 K/uL 9.1 6.6 4.8  Hemoglobin 12.0 - 15.0 g/dL 11.2(L) 11.5(L) 11.3(L)  Hematocrit 36.0 - 46.0 % 33.9(L) 34.7(L) 34.0(L)  Platelets 150 - 400 K/uL 214 257 313     CMP Latest Ref Rng & Units 09/20/2020 09/07/2020 08/30/2020  Glucose 70 - 99 mg/dL 191(H) 193(H) 110(H)  BUN 6 - 20 mg/dL 10 8 10   Creatinine 0.44 - 1.00 mg/dL 0.60 0.61 0.57  Sodium 135 - 145 mmol/L 137 139 135  Potassium 3.5 - 5.1 mmol/L 4.0 3.9 4.0  Chloride 98 - 111 mmol/L 102 101 101  CO2 22 - 32 mmol/L 26 27 28   Calcium 8.9 - 10.3 mg/dL 9.3 9.6 9.2  Total Protein 6.5 - 8.1 g/dL 7.3 7.0 7.0  Total Bilirubin 0.3 - 1.2 mg/dL 0.3 0.3 0.2(L)  Alkaline Phos 38 - 126 U/L 279(H) 227(H) 211(H)  AST 15 - 41 U/L 28 33 23  ALT 0 - 44 U/L 15 16 11       RADIOGRAPHIC STUDIES: I have personally reviewed the radiological images as listed and agreed with the findings in the report. No results found.   ASSESSMENT & PLAN:  Crystal Gibbs is a 45 y.o. female with    1. 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 -Her Liver biopsy from 07/26/20 showed adenocarcinoma. I discussed this type of cancer could be from liver primary or from bile duct primary called Cholangiocarcinoma. There is also possibility her primary cancer is elsewhere, although imaging  did not show other malignancy outside of liver. I dicussed scans have limitations. -Her 07/10/20 MRI abdomen also shows there is at least 1 satellite lesion centrally in the right lobe and her 07/30/20 CT Chest shows Pulmonary nodules are highly worrisome for metastatic disease. Given size of lung nodules, we can monitor with repeat scan in late summer 2022.  -Her Upper endoscopy by Dr [name] on 08/18/20 did not show malignancy. 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 -I discussed her case in recent GI tumor Board and based on the large size and central location of her liver mass, with satellite lesion, this cancer is unresectable. Given surgery is not an option, her cancer is no longer curable, but still treatable. Systemic chemo is her main treatable to control her disease and prolong her life.  -I started her on chemo with First line gemcitabine and cisplatin 2 weeks on/1 week off in addition to  Sara Lee based on recent TOPAZ1 trial data. She started on 08/16/20. The goal of chemotherapy is palliative, if surgery is not an option -She is tolerating treatment well except weakness with standing and moving around. Labs reviewed and adequate to proceed C3D1 Cis/Gem today. Will stop her dexamethasone premeds, IV and Oral.  -Proceed with day 8 chemo and Imfinzi next week.  -f/u next week.  -if she does well with this cycle chemo, will see her every 3 weeks from next cycle    2. Abdominal Pain, secondary to #1 -She has had abdominal pain 2 months before diagnosis. The pain is not daily (mostly with eating and prolonged sitting) but discomfort is mostly daily. -Pain started while she was in [place] and Work up was concerning for malignancy. She chose to return to the [place] for more workup.  -Pain has much improved since she started chemo, continue monitoring and tramadol as needed. She has only needed occasionally. She also  uses Tylenol.  -She has hepatomegaly on exam today. (09/20/20). Her Alk Phos is slowly trending up.    3. Fatigue -Secondary to underlying malignancy and chemotherapy -She reports good response to dexamethasone, which unfortunately would be contraindicated due to his immunotherapy. I recommend her not to use it after chemo.  She agrees. -She notes being uncomfortable with sitting and remains weak when up and active. I again encouraged her to eat well and stay active physically -I also encouraged her to eat adequately with high protein, high calorie diet and use nutritional supplement.    4. Social and Acupuncturist -She lives in [place] with her husband, although she was recently frequently in [place] for. She is a [place] Citizen. -She and her husband does not speak much [place]. They require interpretor, one of who they listed as a point of contact.   -She is not working. He has job, but not currently working, as to help his wife. -She has [employer] health insurance. She does not have a PCP currently in the [place]. -Continue f/u with SW and financial advocate for support and to possibly apply for Medicare/Medicaid.   5. Genetic testing -Given her age and family history of GI cancer with her mother, she is eligible for genetic testing. She was previously referred.   6. H. Pylori -She was diagnosed on 08/18/20 Upper Endoscopy with Dr [name]. She is being treated with Flagyl, Prilosec and doxycycline.  -She has completed therapy   7. Right chest tightness and right arm pain -She notes right shoulder radiating from her right chest tightness and then radiates down arm. -She has Tramadol for her pain, she can continue or Tylenol/Ibuprofen.  -previously improved. Not mentioned today  8. Indeterminate lung nodules -will f/u on next scan      PLAN:  -She can stop oral magnesium, but will refill if her mag level drops in future  -remove Dexamethasone from pre-meds and will not take it  orally -Labs reviewed and adequate  to proceed with C3D1 cisplatin and Gemcitabine today  -Lab, flush, F/u, Cis/Gem and Imfinzi next week.  -GCSF on 6/21   No problem-specific Assessment & Plan notes found for this encounter.   No orders of the defined types were placed in this encounter.  All questions were answered. The patient knows to call the clinic with any problems, questions or concerns. No barriers to learning was detected. The total time spent in the appointment was 30 minutes.     [name], MD 09/20/2020   I, Joslyn Devon, am acting as scribe for [name], MD.   I have reviewed the above documentation for accuracy and completeness, and I agree with the above.

## 2020-09-21 ENCOUNTER — Encounter: Payer: Self-pay | Admitting: Family Medicine

## 2020-09-21 ENCOUNTER — Ambulatory Visit: Payer: 59 | Admitting: Family Medicine

## 2020-09-21 ENCOUNTER — Telehealth: Payer: Self-pay | Admitting: Hematology

## 2020-09-21 ENCOUNTER — Other Ambulatory Visit: Payer: Self-pay

## 2020-09-21 DIAGNOSIS — M19031 Primary osteoarthritis, right wrist: Secondary | ICD-10-CM | POA: Diagnosis not present

## 2020-09-21 DIAGNOSIS — R519 Headache, unspecified: Secondary | ICD-10-CM | POA: Diagnosis not present

## 2020-09-21 DIAGNOSIS — I10 Essential (primary) hypertension: Secondary | ICD-10-CM

## 2020-09-21 LAB — CANCER ANTIGEN 19-9: CA 19-9: 72 U/mL — ABNORMAL HIGH (ref 0–35)

## 2020-09-21 LAB — T4: T4, Total: 8.4 ug/dL (ref 4.5–12.0)

## 2020-09-21 NOTE — Progress Notes (Signed)
    SUBJECTIVE:   CHIEF COMPLAINT / HPI: HTN f/u and arm pain   Arm Pain  Patient reports that she has had significant improvement in her arm pain by monitoring her temperature in her home.  She reports that as long as she stays warm she does not have arm pain.  She did try the Voltaren gel that helped some.  She has no further complaints of arm pain at this time  HTN  Patient continues to tolerate amlodipine 2.5mg  daily. She denies presence of chest pain or shortness of breath.  Patient does report headache but does not believe this is related to her blood pressure as her blood pressure has been normal recently.  Headache Patient states that she had chemotherapy yesterday.  She sometimes has headaches after her chemotherapy sessions but states that yesterday's pain was worse.  She reports left-sided headache that is throbbing in nature.  She also has some nausea that is normal after her chemotherapy.  She states that pain is mostly involved in her left and sometimes in her left ear.  She has been taking ibuprofen and tramadol as prescribed by her oncologist that minimally reduces the pain.  She reports that she has not noticed any change in the amount of headache pain when she drinks or eats.  She reports that she is able to sleep after taking pain medication otherwise that headache will wake her up.  She denies any weakness involved with a headache or any changes in sensation.  She denies any decreased hearing.  She reports some visual changes but attributes this to her history of having cataract surgery in her eyes.  PERTINENT  PMH / PSH:  HTN  Hepatocellular carcinoma  Hyperparathyroidism   OBJECTIVE:   BP 121/85   Pulse 67   Wt 143 lb 12.8 oz (65.2 kg)   SpO2 98%   BMI 24.55 kg/m   General: female appearing stated age in no acute distress HEENT: MMM, no oral lesions noted Cardio: Normal S1 and S2, no S3 or S4. Rhythm is regular. No murmurs or rubs.  Bilateral radial pulses  palpable Pulm: Clear to auscultation bilaterally, no crackles, wheezing, or diminished breath sounds. Normal respiratory effort, stable on room air Extremities: No peripheral edema. Warm/ well perfused.  Neuro: pt alert and oriented x4, cranial nerves intact bilaterally, 5/5 strength in bilateral upper and lower extremities, sensation intact to light touch throughout all extremities, normal gait, pupils equal round reactive to light, extraocular muscles intact bilaterally  ASSESSMENT/PLAN:   HTN (hypertension) BP within normal limits today.  Continue 2.5mg  amlodipine daily    Headache Patient reports HA that occurs after chemotherapy sessions, with current HA lasting longer than prior headaches. Concerned for worsening severity of HA, does not demonstrate neurological abnormalities on exam.  -RX tramadol refill for HA  -will discuss obtaining MRI   Osteoarthritis of right forearm Patient reports improved pain with temperature control      Crystal Foster, MD [place]

## 2020-09-21 NOTE — Telephone Encounter (Signed)
Scheduled follow-up appointments per 6/13 los. Patient's husband is aware.

## 2020-09-21 NOTE — Patient Instructions (Addendum)
It was a pleasure to see you today!  Thank you for choosing [facility] for your primary care.   Crystal Gibbs was seen for headache, blood pressure follow-up, arm pain follow-up.   Our plans for today were: For your headache, I will prescribe a medication to help with your pain. Please continue to take your 2.5 mg of amlodipine for your blood pressure.  Blood pressure looks great today keep up the good work! We were able to review the results of your transvaginal ultrasound.  At your request, we will wait until you have finished your chemotherapy to discuss a referral to gynecology for potential surgery to remove your uterine fibroids.  To keep you healthy, please keep in mind the following health maintenance items that you are due for:   COVID-vaccine Pneumonia vaccine Shingles vaccine   You should return to our clinic in 1 months for follow-up.   Best Wishes,   Dr. Alba Cory

## 2020-09-22 MED ORDER — TRAMADOL HCL 50 MG PO TABS: 100.0000 mg | ORAL_TABLET | Freq: Four times a day (QID) | ORAL | 0 refills | Status: AC | PRN

## 2020-09-22 NOTE — Progress Notes (Signed)
[name]   [phone] [phone]   Clinic Follow up Note   Patient Care Team: [name], MD as PCP - General (Family Medicine) [name], MD as Consulting Physician (Oncology) [name], RN as Oncology Nurse Navigator  Date of Service:  09/27/2020  CHIEF COMPLAINT: F/u of intrahepatic cholangiocarcinoma   SUMMARY OF ONCOLOGIC HISTORY: Oncology History Overview Note  Cancer Staging Intrahepatic cholangiocarcinoma ([place]) Staging form: Intrahepatic Bile Duct, AJCC 8th Edition - Clinical: Stage IB (cT1b, cN0, cM0) - Signed by [name], MD on 08/04/2020    Intrahepatic cholangiocarcinoma ([place])  08/19/2019 Procedure   Upper Endoscopy by Dr [name]  IMPRESSION - Normal esophagus. - Z-line regular, 35 cm from the incisors. - Gastritis. Biopsied. - Normal incisura, antrum and pylorus. - Erythematous duodenopathy. Biopsied. - Normal second portion of the duodenum and third portion of the duodenum. - No evidence of primary malignancy site noted on this study.   07/10/2020 Imaging   US Abdomen 07/10/20 IMPRESSION: 1.  No acute hepatobiliary findings.   2. Heterogeneous liver echotexture with 3 masslike areas as described measuring 4.3 cm, 5.4 cm and 6.4 cm respectively. Recommend CT abdomen/pelvis with intravenous contrast for further evaluation.   07/10/2020 Imaging   CT AP 07/10/20  IMPRESSION: 1. There is a large masslike collection or mass in the central liver measuring at least 10.9 x 7.1 x 10.2 cm with 2 smaller adjacent satellite lesions/collections measuring 11 and 17 mm. These findings may represent a large abscess with satellite abscesses or a large neoplasm/malignancy. Recommend MRI for further evaluation. 2. Left renal cyst. 3. Calcified atherosclerosis in the distal abdominal aorta is mild. 4. Fibroid uterus.   07/10/2020 Imaging   MRI Abdomen 07/10/20  IMPRESSION: 1. The large central hepatic mass does  not demonstrate signal characteristics or enhancement typical of a hemangioma or abscess, and is suspicious for malignancy. There is at least 1 satellite lesion centrally in the right lobe. As there are no signs of underlying cirrhosis, primary considerations include peripheral cholangiocarcinoma and metastatic disease. Tissue sampling recommended. 2. Mild intrahepatic biliary dilatation within the left hepatic lobe. Distortion of the hepatic vasculature without evidence of tumor thrombus. 3. No extrahepatic metastatic disease or primary malignancy identified within the abdomen.   07/26/2020 Imaging   CT AP 07/26/20  IMPRESSION: 1. Markedly poorly visualized and ill-defined 12 cm lesion within the liver. This lesion is better evaluated on MR abdomen 07/10/2020. 2. Otherwise no acute intra-abdominal abnormality with markedly limited evaluation on this noncontrast study. 3.  Aortic Atherosclerosis (ICD10-I70.0).   07/26/2020 Initial Diagnosis   FINAL MICROSCOPIC DIAGNOSIS: 07/26/20  A. LIVER MASS, LEFT, NEEDLE CORE BIOPSY:  - Adenocarcinoma.  - See comment.  COMMENT:  Immunohistochemistry will be performed and reported as an addendum.    07/30/2020 Imaging   CT Chest 07/30/20  IMPRESSION: 1. Pulmonary nodules are highly worrisome for metastatic disease. 2. Hepatic masses are consistent with biopsy-proven adenocarcinoma and better evaluated on MR abdomen 07/10/2020.   08/04/2020 Initial Diagnosis   Intrahepatic cholangiocarcinoma ([place])    08/04/2020 Cancer Staging   Staging form: Intrahepatic Bile Duct, AJCC 8th Edition - Clinical: Stage IB (cT1b, cN0, cM0) - Signed by [name], MD on 08/04/2020    08/13/2020 Procedure   PAC placement   08/16/2020 -  Chemotherapy   First line gemcitabine and cisplatin 2 weeks on/1 week off starting 08/16/20     08/16/2020 -  Chemotherapy   -Addition of durvalumab q3weeks with first-line chemo  starting 08/16/20    08/18/2020 Procedure   Upper  Endoscopy by Dr [name] IMPRESSION - Normal esophagus. - Z-line regular, 35 cm from the incisors. - Gastritis. Biopsied. - Normal incisura, antrum and pylorus. - Erythematous duodenopathy. Biopsied. - Normal second portion of the duodenum and third portion of the duodenum. - No evidence of primary malignancy site noted on this study.    FINAL MICROSCOPIC DIAGNOSIS:   A. DUODENUM, BULB, BIOPSY:  -  Erosive and reactive duodenal mucosa with Brunner's gland hyperplasia  and mixed inflammation  -  No dysplasia or malignancy identified   B. STOMACH, BIOPSY:  -  Chronic active gastritis  -  H. pylori organisms present  -  No intestinal metaplasia identified  -  See comment   COMMENT:   A.  Dr. [name] reviewed the case and agrees with the above diagnosis.   B.  Warthin-Starry stain is POSITIVE for organisms consistent with  Helicobacter pylori.    09/08/2020 Genetic Testing   Negative hereditary cancer genetic testing: no pathogenic variants detected in [facility] Multi-Cancer Panel + Pancreatitis Genes.  Variants of uncertain significance detected in RECQL4 at c.119-20C>A (Intronic) and SDHA at c.1039A>G (p.Met347Val).  The report date is September 08, 2020.    The Multi-Cancer Panel with pancreatitis genes offered by [facility] includes sequencing and/or deletion duplication testing of the following 89 genes: AIP, ALK, APC, ATM, AXIN2,BAP1,  BARD1, BLM, BMPR1A, BRCA1, BRCA2, BRIP1, CASR, CDC73, CDH1, CDK4, CDKN1B, CDKN1C, CDKN2A (p14ARF), CDKN2A (p16INK4a), CEBPA, CFTR, CHEK2, CPA1, CTNNA1, CTRC, DICER1, DIS3L2, EGFR (c.2369C>T, p.Thr790Met variant only), EPCAM (Deletion/duplication testing only), FH, FLCN, GATA2, GPC3, GREM1 (Promoter region deletion/duplication testing only), HOXB13 (c.251G>A, p.Gly84Glu), HRAS, KIT, MAX, MEN1, MET, MITF (c.952G>A, p.Glu318Lys variant only), MLH1, MSH2, MSH3, MSH6, MUTYH, NBN, NF1, NF2, NTHL1, PALB2, PDGFRA, PHOX2B, PMS2, POLD1, POLE, POT1, PRKAR1A, PRSS1,  PTCH1, PTEN, RAD50, RAD51C, RAD51D, RB1, RECQL4, RET, RNF43, RUNX1, SDHAF2, SDHA (sequence changes only), SDHB, SDHC, SDHD, SMAD4, SMARCA4, SMARCB1, SMARCE1, SPINK1, STK11, SUFU, TERC, TERT, TMEM127, TP53, TSC1, TSC2, VHL, WRN and WT1.       CURRENT THERAPY:  First line gemcitabine and cisplatin 2 weeks on/1 week off starting 08/16/09 -Addition of Durvalumab q3weeks starting 08/16/20  INTERVAL HISTORY:   Minnetta Sandora is here for a follow up. She presents to the clinic with her interpretor. She notes last week she had RQU pain. She notes this pain occurs after eating or certain movements. Her pain will reach 5/10. She has not required medication for this pain, mostly tolerable. She notes her eating is still low and fair. Her weight is mostly stable.    REVIEW OF SYSTEMS:   Constitutional: Denies fevers, chills or abnormal weight loss (+) Low appetite.  Eyes: Denies blurriness of vision Ears, nose, mouth, throat, and face: Denies mucositis or sore throat Respiratory: Denies cough, dyspnea or wheezes Cardiovascular: Denies palpitation, chest discomfort or lower extremity swelling Gastrointestinal:  Denies nausea, heartburn or change in bowel habits Skin: Denies abnormal skin rashes Lymphatics: Denies new lymphadenopathy or easy bruising Neurological:Denies numbness, tingling or new weaknesses Behavioral/Psych: Mood is stable, no new changes  All other systems were reviewed with the patient and are negative.  MEDICAL HISTORY:  Past Medical History:  Diagnosis Date   Cancer [facility])    Family history of breast cancer 08/23/2020   Family history of pancreatic cancer 08/23/2020   Family history of prostate cancer 08/23/2020   Family history of stomach cancer 08/23/2020   Fibroid    History of multiple miscarriages  x 3   Hypertension     SURGICAL HISTORY: Past Surgical History:  Procedure Laterality Date   BIOPSY  08/18/2020   Procedure: BIOPSY;  Surgeon: Lavena Bullion, DO;  Location:  [facility];  Service: Gastroenterology;;   CATARACT EXTRACTION Bilateral    ESOPHAGOGASTRODUODENOSCOPY (EGD) WITH PROPOFOL N/A 08/18/2020   Procedure: ESOPHAGOGASTRODUODENOSCOPY (EGD) WITH PROPOFOL;  Surgeon: Lavena Bullion, DO;  Location: [facility];  Service: Gastroenterology;  Laterality: N/A;   IR IMAGING GUIDED PORT INSERTION  08/13/2020   PARATHYROIDECTOMY Right 02/19/15    I have reviewed the social history and family history with the patient and they are unchanged from previous note.  ALLERGIES:  has No Known Allergies.  MEDICATIONS:  Current Outpatient Medications  Medication Sig Dispense Refill   amLODipine (NORVASC) 2.5 MG tablet Take 1 tablet (2.5 mg total) by mouth daily. 90 tablet 1   dexamethasone (DECADRON) 4 MG tablet Take 2 tablets at breakfast for 3 days, starting the day after cisplatin 40 tablet 2   diclofenac Sodium (VOLTAREN) 1 % GEL Apply 4 g topically 4 (four) times daily. 50 g 2   ibuprofen (ADVIL) 200 MG tablet Take 400 mg by mouth every 6 (six) hours as needed for headache.     lidocaine-prilocaine (EMLA) cream Apply to affected area once (Patient taking differently: Apply 1 application topically daily as needed (port access).) 30 g 3   magnesium oxide (MAG-OX) 400 (240 Mg) MG tablet Take 1 tablet (400 mg total) by mouth daily. 30 tablet 3   ondansetron (ZOFRAN ODT) 4 MG disintegrating tablet Take 1-2 tablets (4-8 mg total) by mouth every 6 (six) hours as needed for nausea or vomiting. 30 tablet 1   ondansetron (ZOFRAN) 8 MG tablet Take 1 tablet (8 mg total) by mouth 2 (two) times daily as needed. Start on the third day after cisplatin chemotherapy. 30 tablet 1   potassium chloride SA (KLOR-CON) 20 MEQ tablet Take 1 tablet (20 mEq total) by mouth daily. (Patient not taking: Reported on 09/21/2020) 30 tablet 0   prochlorperazine (COMPAZINE) 10 MG tablet Take 1 tablet (10 mg total) by mouth every 6 (six) hours as needed (Nausea or vomiting). 30 tablet 1    traMADol (ULTRAM) 50 MG tablet Take 2 tablets (100 mg total) by mouth every 6 (six) hours as needed for up to 5 days. 30 tablet 0   No current facility-administered medications for this visit.   Facility-Administered Medications Ordered in Other Visits  Medication Dose Route Frequency Provider Last Rate Last Admin   CISplatin (PLATINOL) 42 mg in sodium chloride 0.9 % 250 mL chemo infusion  25 mg/m2 (Treatment Plan Recorded) Intravenous Once [name], MD        PHYSICAL EXAMINATION: ECOG PERFORMANCE STATUS: 2 - Symptomatic, <50% confined to bed  Vitals with BMI 09/27/2020  Height 5' 4"   Weight 141 lbs 8 oz  BMI 65.46  Systolic 503  Diastolic 66  Pulse 97    GENERAL:alert, no distress and comfortable SKIN: skin color, texture, turgor are normal, no rashes or significant lesions EYES: normal, Conjunctiva are pink and non-injected, sclera clear  NECK: supple, thyroid normal size, non-tender, without nodularity LYMPH:  no palpable lymphadenopathy in the cervical, axillary  LUNGS: clear to auscultation and percussion with normal breathing effort HEART: regular rate & rhythm and no murmurs and no lower extremity edema ABDOMEN:abdomen soft, non-tender and normal bowel sounds (+) Right abdominal tenderness with mild hepatomegaly  Musculoskeletal:no cyanosis of digits and no clubbing  NEURO: alert & oriented x 3 with fluent speech, no focal motor/sensory deficits EXAM PERFORMED IN CHAIR  LABORATORY DATA:  I have reviewed the data as listed CBC Latest Ref Rng & Units 09/27/2020 09/20/2020 09/07/2020  WBC 4.0 - 10.5 K/uL 7.6 9.1 6.6  Hemoglobin 12.0 - 15.0 g/dL 10.4(L) 11.2(L) 11.5(L)  Hematocrit 36.0 - 46.0 % 31.5(L) 33.9(L) 34.7(L)  Platelets 150 - 400 K/uL 260 214 257     CMP Latest Ref Rng & Units 09/27/2020 09/20/2020 09/07/2020  Glucose 70 - 99 mg/dL 185(H) 191(H) 193(H)  BUN 6 - 20 mg/dL 12 10 8   Creatinine 0.44 - 1.00 mg/dL 0.53 0.60 0.61  Sodium 135 - 145 mmol/L 136 137 139   Potassium 3.5 - 5.1 mmol/L 3.8 4.0 3.9  Chloride 98 - 111 mmol/L 99 102 101  CO2 22 - 32 mmol/L 28 26 27   Calcium 8.9 - 10.3 mg/dL 9.1 9.3 9.6  Total Protein 6.5 - 8.1 g/dL 7.5 7.3 7.0  Total Bilirubin 0.3 - 1.2 mg/dL 0.4 0.3 0.3  Alkaline Phos 38 - 126 U/L 236(H) 279(H) 227(H)  AST 15 - 41 U/L 34 28 33  ALT 0 - 44 U/L 25 15 16       RADIOGRAPHIC STUDIES: I have personally reviewed the radiological images as listed and agreed with the findings in the report. No results found.   ASSESSMENT & PLAN:  Crystal Gibbs is a 45 y.o. female with    1. 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 -Her Liver biopsy from 07/26/20 showed adenocarcinoma. I discussed this type of cancer could be from liver primary or from bile duct primary called Cholangiocarcinoma. There is also possibility her primary cancer is elsewhere, although imaging did not show other malignancy outside of liver. I dicussed scans have limitations. -Her 07/10/20 MRI abdomen also shows there is at least 1 satellite lesion centrally in the right lobe and her 07/30/20 CT Chest shows Pulmonary nodules are highly worrisome for metastatic disease. Given size of lung nodules, we can monitor with repeat scan in late summer 2022.  -Her Upper endoscopy by Dr [name] on 08/18/20 did not show malignancy. 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 -I discussed her case in recent GI tumor Board and based on the large size and central location of her liver mass, with satellite lesion, this cancer is unresectable. Given surgery is not an option, her cancer is no longer curable, but still treatable. Systemic chemo is her main treatable to control her  disease and prolong her life.  -I started her on chemo with First line gemcitabine and cisplatin 2 weeks on/1 week off in addition to  [place] based on recent TOPAZ1 trial data. She started on 08/16/20. The goal of chemotherapy is palliative, if surgery is not an option.  -Plan to scan her after C4 in July.  -She is tolerating treatment well except fatigue and low appetite. Labs reviewed and adequate to proceed C3D8 Cis/Gem and Imfinzi today.  -Continue Cis/Gem in 2 and 3 weeks. Continue Imfinzi every 3 weeks (day 8) -F/u in 2 weeks.      2. Abdominal Pain, Hepatomegaly, secondary to #1 -She has had abdominal pain 2 months before diagnosis. The  pain is not daily (mostly with eating and prolonged sitting) but discomfort is mostly daily. -Pain started while she was in [place] and Work up was concerning for malignancy. She chose to return to the [place] for more workup.  -Pain has much improved since she started chemo, continue monitoring and tramadol as needed. She has only needed occasionally. She also uses Tylenol. -She has hepatomegaly since 09/20/20 exam. Her Alk Phos is still elevated, but stable. She has intermittent RUQ mainly with movement and after eating, 5/10. She is fine to take her pain medication, tramadol if needed.    3. Fatigue, Low Appetite  -Secondary to underlying malignancy and chemotherapy -She reports good response to dexamethasone, which unfortunately would be contraindicated due to his immunotherapy. I recommend her not to use it after chemo.  She agrees. -I encouraged her to eat well and stay active physically -I also encouraged her to eat adequately with high protein, high calorie diet and use nutritional supplement.  -Given her RUQ pain and Acid Reflux, she is still eating less. So far able to maintain weight. I recommend she use nutritional supplement to help. I will also call in Protonix for her Acid Reflux (09/27/20)   4. Social and Acupuncturist -She lives in  [place] with her husband, although she was recently frequently in [place] for. She is a [place] Citizen. -She and her husband does not speak much [place]. They require interpretor, one of who they listed as a point of contact.   -She is not working. He has job, but not currently working, as to help his wife. -She has [employer] health insurance. She does not have a PCP currently in the [place]. -Continue f/u with SW and financial advocate for support and to possibly apply for Medicare/Medicaid.   5. Genetic testing -Given her age and family history of GI cancer with her mother, she is eligible for genetic testing. She was previously referred.   6. H. Pylori -She was diagnosed on 08/18/20 Upper Endoscopy with Dr [name]. She is being treated with Flagyl, Prilosec and doxycycline.  -She has completed therapy   7. Right chest tightness and right arm pain -She notes right shoulder radiating from her right chest tightness and then radiates down arm. -She has Tramadol for her pain, she can continue or Tylenol/Ibuprofen.  -previously improved. Not mentioned again today. Likely much improved or resolved.    8. Indeterminate lung nodules -will f/u on next scan      PLAN:  -I called in Protonix today at [facility] reviewed and adequate to proceed with C3D8 cisplatin and Gemcitabine at full dose and Imfinzi today.  -Lab, flush, Cis/Gem in 2 and 3 weeks.   -Imfinzi in 3 weeks.  -F/u in 2 weeks.     No problem-specific Assessment & Plan notes found for this encounter.   No orders of the defined types were placed in this encounter.  All questions were answered. The patient knows to call the clinic with any problems, questions or concerns. No barriers to learning was detected. The total time spent in the appointment was 30 minutes.     [name], MD 09/27/2020   I, Joslyn Devon, am acting as scribe for [name], MD.   I have reviewed the above documentation for accuracy and  completeness, and I agree with the above.

## 2020-09-22 NOTE — Telephone Encounter (Signed)
Called Ms. Dogan in attempt to disclose results of genetic testing.  LVM with assistance of interpreter (ID: [id number]).  Attempt 2.

## 2020-09-23 DIAGNOSIS — R519 Headache, unspecified: Secondary | ICD-10-CM | POA: Insufficient documentation

## 2020-09-23 NOTE — Assessment & Plan Note (Signed)
BP within normal limits today.  Continue 2.5mg  amlodipine daily

## 2020-09-23 NOTE — Assessment & Plan Note (Signed)
Patient reports improved pain with temperature control

## 2020-09-23 NOTE — Assessment & Plan Note (Signed)
Patient reports HA that occurs after chemotherapy sessions, with current HA lasting longer than prior headaches. Concerned for worsening severity of HA, does not demonstrate neurological abnormalities on exam.  -RX tramadol refill for HA  -will discuss obtaining MRI

## 2020-09-27 ENCOUNTER — Encounter: Payer: Self-pay | Admitting: Hematology

## 2020-09-27 ENCOUNTER — Inpatient Hospital Stay: Payer: 59

## 2020-09-27 ENCOUNTER — Other Ambulatory Visit: Payer: Self-pay

## 2020-09-27 ENCOUNTER — Inpatient Hospital Stay (HOSPITAL_BASED_OUTPATIENT_CLINIC_OR_DEPARTMENT_OTHER): Payer: 59 | Admitting: Hematology

## 2020-09-27 VITALS — BP 128/66 | HR 97 | Temp 98.3°F | Resp 18 | Ht 64.0 in | Wt 141.5 lb

## 2020-09-27 DIAGNOSIS — Z95828 Presence of other vascular implants and grafts: Secondary | ICD-10-CM

## 2020-09-27 DIAGNOSIS — C221 Intrahepatic bile duct carcinoma: Secondary | ICD-10-CM

## 2020-09-27 DIAGNOSIS — R519 Headache, unspecified: Secondary | ICD-10-CM

## 2020-09-27 DIAGNOSIS — Z5111 Encounter for antineoplastic chemotherapy: Secondary | ICD-10-CM | POA: Diagnosis not present

## 2020-09-27 LAB — CBC WITH DIFFERENTIAL (CANCER CENTER ONLY)
Abs Immature Granulocytes: 0.07 10*3/uL (ref 0.00–0.07)
Basophils Absolute: 0 10*3/uL (ref 0.0–0.1)
Basophils Relative: 1 %
Eosinophils Absolute: 0 10*3/uL (ref 0.0–0.5)
Eosinophils Relative: 1 %
HCT: 31.5 % — ABNORMAL LOW (ref 36.0–46.0)
Hemoglobin: 10.4 g/dL — ABNORMAL LOW (ref 12.0–15.0)
Immature Granulocytes: 1 %
Lymphocytes Relative: 24 %
Lymphs Abs: 1.8 10*3/uL (ref 0.7–4.0)
MCH: 28.7 pg (ref 26.0–34.0)
MCHC: 33 g/dL (ref 30.0–36.0)
MCV: 86.8 fL (ref 80.0–100.0)
Monocytes Absolute: 0.5 10*3/uL (ref 0.1–1.0)
Monocytes Relative: 6 %
Neutro Abs: 5.2 10*3/uL (ref 1.7–7.7)
Neutrophils Relative %: 67 %
Platelet Count: 260 10*3/uL (ref 150–400)
RBC: 3.63 MIL/uL — ABNORMAL LOW (ref 3.87–5.11)
RDW: 15 % (ref 11.5–15.5)
WBC Count: 7.6 10*3/uL (ref 4.0–10.5)
nRBC: 0 % (ref 0.0–0.2)

## 2020-09-27 LAB — CMP (CANCER CENTER ONLY)
ALT: 25 U/L (ref 0–44)
AST: 34 U/L (ref 15–41)
Albumin: 3.3 g/dL — ABNORMAL LOW (ref 3.5–5.0)
Alkaline Phosphatase: 236 U/L — ABNORMAL HIGH (ref 38–126)
Anion gap: 9 (ref 5–15)
BUN: 12 mg/dL (ref 6–20)
CO2: 28 mmol/L (ref 22–32)
Calcium: 9.1 mg/dL (ref 8.9–10.3)
Chloride: 99 mmol/L (ref 98–111)
Creatinine: 0.53 mg/dL (ref 0.44–1.00)
GFR, Estimated: >60 mL/min (ref >60)
Glucose, Bld: 185 mg/dL — ABNORMAL HIGH (ref 70–99)
Potassium: 3.8 mmol/L (ref 3.5–5.1)
Sodium: 136 mmol/L (ref 135–145)
Total Bilirubin: 0.4 mg/dL (ref 0.3–1.2)
Total Protein: 7.5 g/dL (ref 6.5–8.1)

## 2020-09-27 LAB — PREGNANCY, URINE: Preg Test, Ur: NEGATIVE

## 2020-09-27 LAB — TSH: TSH: 2.396 u[IU]/mL (ref 0.350–4.500)

## 2020-09-27 LAB — MAGNESIUM: Magnesium: 1.6 mg/dL — ABNORMAL LOW (ref 1.7–2.4)

## 2020-09-27 MED ORDER — SODIUM CHLORIDE 0.9 % IV SOLN
Freq: Once | INTRAVENOUS | Status: AC
Start: 2020-09-27 — End: 2020-09-27
  Filled 2020-09-27: qty 250

## 2020-09-27 MED ORDER — DIPHENHYDRAMINE HCL 25 MG PO CAPS
ORAL_CAPSULE | ORAL | Status: AC
  Filled 2020-09-27: qty 1

## 2020-09-27 MED ORDER — SODIUM CHLORIDE 0.9 % IV SOLN
150.0000 mg | Freq: Once | INTRAVENOUS | Status: AC
  Administered 2020-09-27: 150 mg via INTRAVENOUS
  Filled 2020-09-27: qty 150

## 2020-09-27 MED ORDER — ACETAMINOPHEN 325 MG PO TABS
650.0000 mg | ORAL_TABLET | Freq: Once | ORAL | Status: AC
  Administered 2020-09-27: 650 mg via ORAL

## 2020-09-27 MED ORDER — SODIUM CHLORIDE 0.9 % IV SOLN
25.0000 mg/m2 | Freq: Once | INTRAVENOUS | Status: AC
  Administered 2020-09-27: 42 mg via INTRAVENOUS
  Filled 2020-09-27: qty 42

## 2020-09-27 MED ORDER — SODIUM CHLORIDE 0.9 % IV SOLN
1000.0000 mg/m2 | Freq: Once | INTRAVENOUS | Status: AC
  Administered 2020-09-27: 1672 mg via INTRAVENOUS
  Filled 2020-09-27: qty 43.97

## 2020-09-27 MED ORDER — PALONOSETRON HCL INJECTION 0.25 MG/5ML
0.2500 mg | Freq: Once | INTRAVENOUS | Status: AC
  Administered 2020-09-27: 0.25 mg via INTRAVENOUS

## 2020-09-27 MED ORDER — MAGNESIUM SULFATE 2 GM/50ML IV SOLN
INTRAVENOUS | Status: AC
  Filled 2020-09-27: qty 50

## 2020-09-27 MED ORDER — ACETAMINOPHEN 325 MG PO TABS
ORAL_TABLET | ORAL | Status: AC
  Filled 2020-09-27: qty 2

## 2020-09-27 MED ORDER — SODIUM CHLORIDE 0.9% FLUSH
10.0000 mL | Freq: Once | INTRAVENOUS | Status: AC
Start: 2020-09-27 — End: 2020-09-27
  Administered 2020-09-27: 10 mL via INTRAVENOUS
  Filled 2020-09-27: qty 10

## 2020-09-27 MED ORDER — HEPARIN SOD (PORK) LOCK FLUSH 100 UNIT/ML IV SOLN
500.0000 [IU] | Freq: Once | INTRAVENOUS | Status: AC
  Administered 2020-09-27: 500 [IU] via INTRAVENOUS
  Filled 2020-09-27: qty 5

## 2020-09-27 MED ORDER — MAGNESIUM SULFATE 2 GM/50ML IV SOLN
2.0000 g | Freq: Once | INTRAVENOUS | Status: AC
  Administered 2020-09-27: 2 g via INTRAVENOUS

## 2020-09-27 MED ORDER — PALONOSETRON HCL INJECTION 0.25 MG/5ML
INTRAVENOUS | Status: AC
  Filled 2020-09-27: qty 5

## 2020-09-27 MED ORDER — SODIUM CHLORIDE 0.9% FLUSH
10.0000 mL | Freq: Once | INTRAVENOUS | Status: AC
Start: 2020-09-27 — End: 2020-09-27
  Administered 2020-09-27: 10 mL
  Filled 2020-09-27: qty 10

## 2020-09-27 MED ORDER — SODIUM CHLORIDE 0.9 % IV SOLN
1500.0000 mg | Freq: Once | INTRAVENOUS | Status: AC
  Administered 2020-09-27: 1500 mg via INTRAVENOUS
  Filled 2020-09-27: qty 30

## 2020-09-27 MED ORDER — POTASSIUM CHLORIDE IN NACL 20-0.9 MEQ/L-% IV SOLN
Freq: Once | INTRAVENOUS | Status: AC
Start: 2020-09-27 — End: 2020-09-27
  Filled 2020-09-27: qty 1000

## 2020-09-27 MED ORDER — PANTOPRAZOLE SODIUM 40 MG PO TBEC: 40.0000 mg | DELAYED_RELEASE_TABLET | Freq: Every day | ORAL | 1 refills | Status: DC

## 2020-09-27 NOTE — Patient Instructions (Addendum)
[facility]  Discharge Instructions: Thank you for choosing [facility] to provide your oncology and hematology care.   If you have a lab appointment with the [facility], please go directly to the [facility] and check in at the registration area.   Wear comfortable clothing and clothing appropriate for easy access to any Portacath or PICC line.   We strive to give you quality time with your provider. You may need to reschedule your appointment if you arrive late (15 or more minutes).  Arriving late affects you and other patients whose appointments are after yours.  Also, if you miss three or more appointments without notifying the office, you may be dismissed from the clinic at the provider's discretion.      For prescription refill requests, have your pharmacy contact our office and allow 72 hours for refills to be completed.    Today you received the following chemotherapy and/or immunotherapy agents cisplatin, gemzar, imfinzi.      To help prevent nausea and vomiting after your treatment, we encourage you to take your nausea medication as directed.  BELOW ARE SYMPTOMS THAT SHOULD BE REPORTED IMMEDIATELY: *FEVER GREATER THAN 100.4 F (38 C) OR HIGHER *CHILLS OR SWEATING *NAUSEA AND VOMITING THAT IS NOT CONTROLLED WITH YOUR NAUSEA MEDICATION *UNUSUAL SHORTNESS OF BREATH *UNUSUAL BRUISING OR BLEEDING *URINARY PROBLEMS (pain or burning when urinating, or frequent urination) *BOWEL PROBLEMS (unusual diarrhea, constipation, pain near the anus) TENDERNESS IN MOUTH AND THROAT WITH OR WITHOUT PRESENCE OF ULCERS (sore throat, sores in mouth, or a toothache) UNUSUAL RASH, SWELLING OR PAIN  UNUSUAL VAGINAL DISCHARGE OR ITCHING   Items with * indicate a potential emergency and should be followed up as soon as possible or go to the Emergency Department if any problems should occur.  Please show the CHEMOTHERAPY ALERT CARD or IMMUNOTHERAPY ALERT  CARD at check-in to the Emergency Department and triage nurse.  Should you have questions after your visit or need to cancel or reschedule your appointment, please contact [facility]  Dept: [phone]  and follow the prompts.  Office hours are 8:00 a.m. to 4:30 p.m. Monday - Friday. Please note that voicemails left after 4:00 p.m. may not be returned until the following business day.  We are closed weekends and major holidays. You have access to a nurse at all times for urgent questions. Please call the main number to the clinic Dept: [phone] and follow the prompts.   For any non-urgent questions, you may also contact your provider using MyChart. We now offer e-Visits for anyone 76 and older to request care online for non-urgent symptoms. For details visit [email or web address].   Also download the MyChart app! Go to the app store, search "MyChart", open the app, select [facility], and log in with your MyChart username and password.  Due to Covid, a mask is required upon entering the hospital/clinic. If you do not have a mask, one will be given to you upon arrival. For doctor visits, patients may have 1 support person aged 57 or older with them. For treatment visits, patients cannot have anyone with them due to current Covid guidelines and our immunocompromised population.

## 2020-09-27 NOTE — Progress Notes (Signed)
Give gemzar 1000mg /m2 and cisplatin 25mg /m2 today.  Add mag 2g.  Per Dr Burr Medico

## 2020-09-28 ENCOUNTER — Inpatient Hospital Stay: Payer: 59

## 2020-09-28 VITALS — BP 143/85 | HR 100 | Temp 98.9°F | Resp 18

## 2020-09-28 DIAGNOSIS — Z5111 Encounter for antineoplastic chemotherapy: Secondary | ICD-10-CM | POA: Diagnosis not present

## 2020-09-28 DIAGNOSIS — C221 Intrahepatic bile duct carcinoma: Secondary | ICD-10-CM

## 2020-09-28 LAB — T4: T4, Total: 8.7 ug/dL (ref 4.5–12.0)

## 2020-09-28 LAB — CANCER ANTIGEN 19-9: CA 19-9: 59 U/mL — ABNORMAL HIGH (ref 0–35)

## 2020-09-28 MED ORDER — PEGFILGRASTIM INJECTION 6 MG/0.6ML ~~LOC~~
6.0000 mg | PREFILLED_SYRINGE | Freq: Once | SUBCUTANEOUS | Status: AC
  Administered 2020-09-28: 6 mg via SUBCUTANEOUS
  Filled 2020-09-28: qty 0.6

## 2020-09-28 NOTE — Patient Instructions (Signed)
[facility] ONCOLOGY  Discharge Instructions: Thank you for choosing [facility] to provide your oncology and hematology care.   If you have a lab appointment with the [facility], please go directly to the [facility] and check in at the registration area.   Wear comfortable clothing and clothing appropriate for easy access to any Portacath or PICC line.   We strive to give you quality time with your provider. You may need to reschedule your appointment if you arrive late (15 or more minutes).  Arriving late affects you and other patients whose appointments are after yours.  Also, if you miss three or more appointments without notifying the office, you may be dismissed from the clinic at the provider's discretion.      For prescription refill requests, have your pharmacy contact our office and allow 72 hours for refills to be completed.    Today you received neulasta injection   To help prevent nausea and vomiting after your treatment, we encourage you to take your nausea medication as directed.  BELOW ARE SYMPTOMS THAT SHOULD BE REPORTED IMMEDIATELY: *FEVER GREATER THAN 100.4 F (38 C) OR HIGHER *CHILLS OR SWEATING *NAUSEA AND VOMITING THAT IS NOT CONTROLLED WITH YOUR NAUSEA MEDICATION *UNUSUAL SHORTNESS OF BREATH *UNUSUAL BRUISING OR BLEEDING *URINARY PROBLEMS (pain or burning when urinating, or frequent urination) *BOWEL PROBLEMS (unusual diarrhea, constipation, pain near the anus) TENDERNESS IN MOUTH AND THROAT WITH OR WITHOUT PRESENCE OF ULCERS (sore throat, sores in mouth, or a toothache) UNUSUAL RASH, SWELLING OR PAIN  UNUSUAL VAGINAL DISCHARGE OR ITCHING   Items with * indicate a potential emergency and should be followed up as soon as possible or go to the Emergency Department if any problems should occur.  Please show the CHEMOTHERAPY ALERT CARD or IMMUNOTHERAPY ALERT CARD at check-in to the Emergency Department and triage  nurse.  Should you have questions after your visit or need to cancel or reschedule your appointment, please contact [facility]  Dept: [phone]  and follow the prompts.  Office hours are 8:00 a.m. to 4:30 p.m. Monday - Friday. Please note that voicemails left after 4:00 p.m. may not be returned until the following business day.  We are closed weekends and major holidays. You have access to a nurse at all times for urgent questions. Please call the main number to the clinic Dept: [phone] and follow the prompts.   For any non-urgent questions, you may also contact your provider using MyChart. We now offer e-Visits for anyone 53 and older to request care online for non-urgent symptoms. For details visit [email or web address].   Also download the MyChart app! Go to the app store, search "MyChart", open the app, select [facility], and log in with your MyChart username and password.  Due to Covid, a mask is required upon entering the hospital/clinic. If you do not have a mask, one will be given to you upon arrival. For doctor visits, patients may have 1 support person aged 25 or older with them. For treatment visits, patients cannot have anyone with them due to current Covid guidelines and our immunocompromised population.

## 2020-10-06 ENCOUNTER — Telehealth: Payer: Self-pay | Admitting: Genetic Counselor

## 2020-10-06 NOTE — Telephone Encounter (Signed)
LVM with assistance of interpreter [place], [id number]) informing Crystal Gibbs that genetic testing results are available.  Third attempt at contact.  Will provide results to [name] to be discussed at next appointment.

## 2020-10-12 ENCOUNTER — Other Ambulatory Visit: Payer: 59

## 2020-10-12 ENCOUNTER — Ambulatory Visit: Payer: 59 | Admitting: Nurse Practitioner

## 2020-10-15 ENCOUNTER — Inpatient Hospital Stay: Payer: 59

## 2020-10-15 ENCOUNTER — Inpatient Hospital Stay (HOSPITAL_BASED_OUTPATIENT_CLINIC_OR_DEPARTMENT_OTHER): Payer: 59 | Admitting: Hematology

## 2020-10-15 ENCOUNTER — Other Ambulatory Visit: Payer: Self-pay

## 2020-10-15 ENCOUNTER — Inpatient Hospital Stay: Payer: 59 | Attending: Hematology

## 2020-10-15 VITALS — BP 110/86 | HR 94 | Temp 98.2°F | Resp 18 | Wt 144.4 lb

## 2020-10-15 DIAGNOSIS — Z5111 Encounter for antineoplastic chemotherapy: Secondary | ICD-10-CM | POA: Insufficient documentation

## 2020-10-15 DIAGNOSIS — Z3202 Encounter for pregnancy test, result negative: Secondary | ICD-10-CM | POA: Diagnosis not present

## 2020-10-15 DIAGNOSIS — Z800 Family history of malignant neoplasm of digestive organs: Secondary | ICD-10-CM | POA: Diagnosis not present

## 2020-10-15 DIAGNOSIS — C221 Intrahepatic bile duct carcinoma: Secondary | ICD-10-CM

## 2020-10-15 DIAGNOSIS — R918 Other nonspecific abnormal finding of lung field: Secondary | ICD-10-CM | POA: Diagnosis not present

## 2020-10-15 DIAGNOSIS — K219 Gastro-esophageal reflux disease without esophagitis: Secondary | ICD-10-CM | POA: Diagnosis not present

## 2020-10-15 DIAGNOSIS — I10 Essential (primary) hypertension: Secondary | ICD-10-CM | POA: Insufficient documentation

## 2020-10-15 DIAGNOSIS — M79606 Pain in leg, unspecified: Secondary | ICD-10-CM | POA: Insufficient documentation

## 2020-10-15 DIAGNOSIS — Z5189 Encounter for other specified aftercare: Secondary | ICD-10-CM | POA: Insufficient documentation

## 2020-10-15 DIAGNOSIS — R5383 Other fatigue: Secondary | ICD-10-CM | POA: Insufficient documentation

## 2020-10-15 DIAGNOSIS — G893 Neoplasm related pain (acute) (chronic): Secondary | ICD-10-CM | POA: Diagnosis not present

## 2020-10-15 DIAGNOSIS — R140 Abdominal distension (gaseous): Secondary | ICD-10-CM | POA: Insufficient documentation

## 2020-10-15 DIAGNOSIS — Z95828 Presence of other vascular implants and grafts: Secondary | ICD-10-CM

## 2020-10-15 LAB — CBC WITH DIFFERENTIAL (CANCER CENTER ONLY)
Abs Immature Granulocytes: 0.07 10*3/uL (ref 0.00–0.07)
Basophils Absolute: 0.1 10*3/uL (ref 0.0–0.1)
Basophils Relative: 1 %
Eosinophils Absolute: 0.1 10*3/uL (ref 0.0–0.5)
Eosinophils Relative: 1 %
HCT: 33.4 % — ABNORMAL LOW (ref 36.0–46.0)
Hemoglobin: 10.9 g/dL — ABNORMAL LOW (ref 12.0–15.0)
Immature Granulocytes: 1 %
Lymphocytes Relative: 24 %
Lymphs Abs: 2.4 10*3/uL (ref 0.7–4.0)
MCH: 29.8 pg (ref 26.0–34.0)
MCHC: 32.6 g/dL (ref 30.0–36.0)
MCV: 91.3 fL (ref 80.0–100.0)
Monocytes Absolute: 0.9 10*3/uL (ref 0.1–1.0)
Monocytes Relative: 10 %
Neutro Abs: 6.2 10*3/uL (ref 1.7–7.7)
Neutrophils Relative %: 63 %
Platelet Count: 341 10*3/uL (ref 150–400)
RBC: 3.66 MIL/uL — ABNORMAL LOW (ref 3.87–5.11)
RDW: 18.6 % — ABNORMAL HIGH (ref 11.5–15.5)
WBC Count: 9.8 10*3/uL (ref 4.0–10.5)
nRBC: 0 % (ref 0.0–0.2)

## 2020-10-15 LAB — CMP (CANCER CENTER ONLY)
ALT: 15 U/L (ref 0–44)
AST: 25 U/L (ref 15–41)
Albumin: 3.2 g/dL — ABNORMAL LOW (ref 3.5–5.0)
Alkaline Phosphatase: 244 U/L — ABNORMAL HIGH (ref 38–126)
Anion gap: 11 (ref 5–15)
BUN: 11 mg/dL (ref 6–20)
CO2: 26 mmol/L (ref 22–32)
Calcium: 9.4 mg/dL (ref 8.9–10.3)
Chloride: 103 mmol/L (ref 98–111)
Creatinine: 0.65 mg/dL (ref 0.44–1.00)
GFR, Estimated: >60 mL/min (ref >60)
Glucose, Bld: 160 mg/dL — ABNORMAL HIGH (ref 70–99)
Potassium: 4 mmol/L (ref 3.5–5.1)
Sodium: 140 mmol/L (ref 135–145)
Total Bilirubin: 0.4 mg/dL (ref 0.3–1.2)
Total Protein: 7.4 g/dL (ref 6.5–8.1)

## 2020-10-15 LAB — MAGNESIUM: Magnesium: 1.6 mg/dL — ABNORMAL LOW (ref 1.7–2.4)

## 2020-10-15 LAB — PREGNANCY, URINE: Preg Test, Ur: NEGATIVE

## 2020-10-15 LAB — TSH: TSH: 3.124 u[IU]/mL (ref 0.308–3.960)

## 2020-10-15 MED ORDER — PALONOSETRON HCL INJECTION 0.25 MG/5ML
0.2500 mg | Freq: Once | INTRAVENOUS | Status: AC
Start: 2020-10-15 — End: 2020-10-15
  Administered 2020-10-15: 0.25 mg via INTRAVENOUS

## 2020-10-15 MED ORDER — MAGNESIUM SULFATE 2 GM/50ML IV SOLN
INTRAVENOUS | Status: AC
  Filled 2020-10-15: qty 50

## 2020-10-15 MED ORDER — FOSAPREPITANT DIMEGLUMINE INJECTION 150 MG
150.0000 mg | Freq: Once | INTRAVENOUS | Status: AC
  Administered 2020-10-15: 150 mg via INTRAVENOUS
  Filled 2020-10-15: qty 150

## 2020-10-15 MED ORDER — MAGNESIUM SULFATE 2 GM/50ML IV SOLN
2.0000 g | Freq: Once | INTRAVENOUS | Status: AC
  Administered 2020-10-15: 2 g via INTRAVENOUS

## 2020-10-15 MED ORDER — SODIUM CHLORIDE 0.9 % IV SOLN
Freq: Once | INTRAVENOUS | Status: AC
  Filled 2020-10-15: qty 250

## 2020-10-15 MED ORDER — POTASSIUM CHLORIDE IN NACL 20-0.9 MEQ/L-% IV SOLN
Freq: Once | INTRAVENOUS | Status: AC
  Filled 2020-10-15: qty 1000

## 2020-10-15 MED ORDER — SODIUM CHLORIDE 0.9% FLUSH
10.0000 mL | INTRAVENOUS | Status: DC | PRN
  Administered 2020-10-15: 10 mL
  Filled 2020-10-15: qty 10

## 2020-10-15 MED ORDER — PALONOSETRON HCL INJECTION 0.25 MG/5ML
INTRAVENOUS | Status: AC
  Filled 2020-10-15: qty 5

## 2020-10-15 MED ORDER — HEPARIN SOD (PORK) LOCK FLUSH 100 UNIT/ML IV SOLN
500.0000 [IU] | Freq: Once | INTRAVENOUS | Status: AC | PRN
  Administered 2020-10-15: 500 [IU]
  Filled 2020-10-15: qty 5

## 2020-10-15 MED ORDER — SODIUM CHLORIDE 0.9% FLUSH
10.0000 mL | Freq: Once | INTRAVENOUS | Status: AC
  Administered 2020-10-15: 10 mL
  Filled 2020-10-15: qty 10

## 2020-10-15 MED ORDER — SODIUM CHLORIDE 0.9 % IV SOLN
25.0000 mg/m2 | Freq: Once | INTRAVENOUS | Status: AC
  Administered 2020-10-15: 42 mg via INTRAVENOUS
  Filled 2020-10-15: qty 42

## 2020-10-15 MED ORDER — SODIUM CHLORIDE 0.9 % IV SOLN
1000.0000 mg/m2 | Freq: Once | INTRAVENOUS | Status: AC
  Administered 2020-10-15: 1672 mg via INTRAVENOUS
  Filled 2020-10-15: qty 43.97

## 2020-10-15 NOTE — Patient Instructions (Signed)
[facility]  Discharge Instructions: Thank you for choosing [facility] to provide your oncology and hematology care.   If you have a lab appointment with the Cancer Center, please go directly to the Cancer Center and check in at the registration area.   Wear comfortable clothing and clothing appropriate for easy access to any Portacath or PICC line.   We strive to give you quality time with your provider. You may need to reschedule your appointment if you arrive late (15 or more minutes).  Arriving late affects you and other patients whose appointments are after yours.  Also, if you miss three or more appointments without notifying the office, you may be dismissed from the clinic at the provider's discretion.      For prescription refill requests, have your pharmacy contact our office and allow 72 hours for refills to be completed.    Today you received the following chemotherapy and/or immunotherapy agents : Cisplatin, Gemzar    To help prevent nausea and vomiting after your treatment, we encourage you to take your nausea medication as directed.  BELOW ARE SYMPTOMS THAT SHOULD BE REPORTED IMMEDIATELY: *FEVER GREATER THAN 100.4 F (38 C) OR HIGHER *CHILLS OR SWEATING *NAUSEA AND VOMITING THAT IS NOT CONTROLLED WITH YOUR NAUSEA MEDICATION *UNUSUAL SHORTNESS OF BREATH *UNUSUAL BRUISING OR BLEEDING *URINARY PROBLEMS (pain or burning when urinating, or frequent urination) *BOWEL PROBLEMS (unusual diarrhea, constipation, pain near the anus) TENDERNESS IN MOUTH AND THROAT WITH OR WITHOUT PRESENCE OF ULCERS (sore throat, sores in mouth, or a toothache) UNUSUAL RASH, SWELLING OR PAIN  UNUSUAL VAGINAL DISCHARGE OR ITCHING   Items with * indicate a potential emergency and should be followed up as soon as possible or go to the Emergency Department if any problems should occur.  Please show the CHEMOTHERAPY ALERT CARD or IMMUNOTHERAPY ALERT CARD at  check-in to the Emergency Department and triage nurse.  Should you have questions after your visit or need to cancel or reschedule your appointment, please contact [facility]  Dept: [phone]  and follow the prompts.  Office hours are 8:00 a.m. to 4:30 p.m. Monday - Friday. Please note that voicemails left after 4:00 p.m. may not be returned until the following business day.  We are closed weekends and major holidays. You have access to a nurse at all times for urgent questions. Please call the main number to the clinic Dept: [phone] and follow the prompts.   For any non-urgent questions, you may also contact your provider using MyChart. We now offer e-Visits for anyone 18 and older to request care online for non-urgent symptoms. For details visit [email or web address].   Also download the MyChart app! Go to the app store, search "MyChart", open the app, select [facility], and log in with your MyChart username and password.  Due to Covid, a mask is required upon entering the hospital/clinic. If you do not have a mask, one will be given to you upon arrival. For doctor visits, patients may have 1 support person aged 18 or older with them. For treatment visits, patients cannot have anyone with them due to current Covid guidelines and our immunocompromised population.   

## 2020-10-15 NOTE — Progress Notes (Signed)
Crystal Gibbs   [phone] [phone]   Clinic Follow up Note   Patient Care Team: [name], MD as PCP - General (Family Medicine) [name], MD as Consulting Physician (Oncology) [name], RN (Inactive) as Oncology Nurse Navigator  Date of Service:  10/15/2020  CHIEF COMPLAINT: f/u of intrahepatic cholangiocarcinoma  SUMMARY OF ONCOLOGIC HISTORY: Oncology History Overview Note  Cancer Staging Intrahepatic cholangiocarcinoma ([place]) Staging form: Intrahepatic Bile Duct, AJCC 8th Edition - Clinical: Stage IB (cT1b, cN0, cM0) - Signed by [name], MD on 08/04/2020    Intrahepatic cholangiocarcinoma ([place])  08/19/2019 Procedure   Upper Endoscopy by Dr [name]  IMPRESSION - Normal esophagus. - Z-line regular, 35 cm from the incisors. - Gastritis. Biopsied. - Normal incisura, antrum and pylorus. - Erythematous duodenopathy. Biopsied. - Normal second portion of the duodenum and third portion of the duodenum. - No evidence of primary malignancy site noted on this study.   07/10/2020 Imaging   US Abdomen 07/10/20 IMPRESSION: 1.  No acute hepatobiliary findings.   2. Heterogeneous liver echotexture with 3 masslike areas as described measuring 4.3 cm, 5.4 cm and 6.4 cm respectively. Recommend CT abdomen/pelvis with intravenous contrast for further evaluation.   07/10/2020 Imaging   CT AP 07/10/20  IMPRESSION: 1. There is a large masslike collection or mass in the central liver measuring at least 10.9 x 7.1 x 10.2 cm with 2 smaller adjacent satellite lesions/collections measuring 11 and 17 mm. These findings may represent a large abscess with satellite abscesses or a large neoplasm/malignancy. Recommend MRI for further evaluation. 2. Left renal cyst. 3. Calcified atherosclerosis in the distal abdominal aorta is mild. 4. Fibroid uterus.   07/10/2020 Imaging   MRI Abdomen 07/10/20  IMPRESSION: 1. The large central hepatic  mass does not demonstrate signal characteristics or enhancement typical of a hemangioma or abscess, and is suspicious for malignancy. There is at least 1 satellite lesion centrally in the right lobe. As there are no signs of underlying cirrhosis, primary considerations include peripheral cholangiocarcinoma and metastatic disease. Tissue sampling recommended. 2. Mild intrahepatic biliary dilatation within the left hepatic lobe. Distortion of the hepatic vasculature without evidence of tumor thrombus. 3. No extrahepatic metastatic disease or primary malignancy identified within the abdomen.   07/26/2020 Imaging   CT AP 07/26/20  IMPRESSION: 1. Markedly poorly visualized and ill-defined 12 cm lesion within the liver. This lesion is better evaluated on MR abdomen 07/10/2020. 2. Otherwise no acute intra-abdominal abnormality with markedly limited evaluation on this noncontrast study. 3.  Aortic Atherosclerosis (ICD10-I70.0).   07/26/2020 Initial Diagnosis   FINAL MICROSCOPIC DIAGNOSIS: 07/26/20  A. LIVER MASS, LEFT, NEEDLE CORE BIOPSY:  - Adenocarcinoma.  - See comment.  COMMENT:  Immunohistochemistry will be performed and reported as an addendum.    07/30/2020 Imaging   CT Chest 07/30/20  IMPRESSION: 1. Pulmonary nodules are highly worrisome for metastatic disease. 2. Hepatic masses are consistent with biopsy-proven adenocarcinoma and better evaluated on MR abdomen 07/10/2020.   08/04/2020 Initial Diagnosis   Intrahepatic cholangiocarcinoma ([place])    08/04/2020 Cancer Staging   Staging form: Intrahepatic Bile Duct, AJCC 8th Edition - Clinical: Stage IB (cT1b, cN0, cM0) - Signed by [name], MD on 08/04/2020    08/13/2020 Procedure   PAC placement   08/16/2020 -  Chemotherapy   First line gemcitabine and cisplatin 2 weeks on/1 week off starting 08/16/20     08/16/2020 -  Chemotherapy   -Addition of durvalumab q3weeks with first-line chemo  starting 08/16/20    08/18/2020 Procedure    Upper Endoscopy by Dr [name] IMPRESSION - Normal esophagus. - Z-line regular, 35 cm from the incisors. - Gastritis. Biopsied. - Normal incisura, antrum and pylorus. - Erythematous duodenopathy. Biopsied. - Normal second portion of the duodenum and third portion of the duodenum. - No evidence of primary malignancy site noted on this study.    FINAL MICROSCOPIC DIAGNOSIS:   A. DUODENUM, BULB, BIOPSY:  -  Erosive and reactive duodenal mucosa with Brunner's gland hyperplasia  and mixed inflammation  -  No dysplasia or malignancy identified   B. STOMACH, BIOPSY:  -  Chronic active gastritis  -  H. pylori organisms present  -  No intestinal metaplasia identified  -  See comment   COMMENT:   A.  Dr. [name] reviewed the case and agrees with the above diagnosis.   B.  Warthin-Starry stain is POSITIVE for organisms consistent with  Helicobacter pylori.    09/08/2020 Genetic Testing   Negative hereditary cancer genetic testing: no pathogenic variants detected in [facility] Multi-Cancer Panel + Pancreatitis Genes.  Variants of uncertain significance detected in RECQL4 at c.119-20C>A (Intronic) and SDHA at c.1039A>G (p.Met347Val).  The report date is September 08, 2020.    The Multi-Cancer Panel with pancreatitis genes offered by [facility] includes sequencing and/or deletion duplication testing of the following 89 genes: AIP, ALK, APC, ATM, AXIN2,BAP1,  BARD1, BLM, BMPR1A, BRCA1, BRCA2, BRIP1, CASR, CDC73, CDH1, CDK4, CDKN1B, CDKN1C, CDKN2A (p14ARF), CDKN2A (p16INK4a), CEBPA, CFTR, CHEK2, CPA1, CTNNA1, CTRC, DICER1, DIS3L2, EGFR (c.2369C>T, p.Thr790Met variant only), EPCAM (Deletion/duplication testing only), FH, FLCN, GATA2, GPC3, GREM1 (Promoter region deletion/duplication testing only), HOXB13 (c.251G>A, p.Gly84Glu), HRAS, KIT, MAX, MEN1, MET, MITF (c.952G>A, p.Glu318Lys variant only), MLH1, MSH2, MSH3, MSH6, MUTYH, NBN, NF1, NF2, NTHL1, PALB2, PDGFRA, PHOX2B, PMS2, POLD1, POLE, POT1, PRKAR1A,  PRSS1, PTCH1, PTEN, RAD50, RAD51C, RAD51D, RB1, RECQL4, RET, RNF43, RUNX1, SDHAF2, SDHA (sequence changes only), SDHB, SDHC, SDHD, SMAD4, SMARCA4, SMARCB1, SMARCE1, SPINK1, STK11, SUFU, TERC, TERT, TMEM127, TP53, TSC1, TSC2, VHL, WRN and WT1.       CURRENT THERAPY:  First line gemcitabine and cisplatin 2 weeks on/1 week off starting 08/16/20 -Addition of Durvalumab q3weeks starting 08/16/20  INTERVAL HISTORY:  Crystal Gibbs is here for a follow up of intrahepatic cholangiocarcinoma. She was last seen by me on 09/27/20. She was evaluated in the infusion area. An interpreter is present. She reports fatigue, leg pain, and abdominal bloating. She notes she is only able to walk or stand for about 3 minutes at a time because of her leg pain. She requires support to stand and ambulate. This has left her unable to do cooking or housework.  She notes her appetite is improving. She notes a toothache and wonders if she can use an ointment for it.  All other systems were reviewed with the patient and are negative.  MEDICAL HISTORY:  Past Medical History:  Diagnosis Date   Cancer [facility])    Family history of breast cancer 08/23/2020   Family history of pancreatic cancer 08/23/2020   Family history of prostate cancer 08/23/2020   Family history of stomach cancer 08/23/2020   Fibroid    History of multiple miscarriages    x 3   Hypertension     SURGICAL HISTORY: Past Surgical History:  Procedure Laterality Date   BIOPSY  08/18/2020   Procedure: BIOPSY;  Surgeon: Lavena Bullion, DO;  Location: [facility];  Service: Gastroenterology;;   CATARACT EXTRACTION Bilateral    ESOPHAGOGASTRODUODENOSCOPY (  EGD) WITH PROPOFOL N/A 08/18/2020   Procedure: ESOPHAGOGASTRODUODENOSCOPY (EGD) WITH PROPOFOL;  Surgeon: Lavena Bullion, DO;  Location: [facility];  Service: Gastroenterology;  Laterality: N/A;   IR IMAGING GUIDED PORT INSERTION  08/13/2020   PARATHYROIDECTOMY Right 02/19/15    I have reviewed the social  history and family history with the patient and they are unchanged from previous note.  ALLERGIES:  has No Known Allergies.  MEDICATIONS:  Current Outpatient Medications  Medication Sig Dispense Refill   amLODipine (NORVASC) 2.5 MG tablet Take 1 tablet (2.5 mg total) by mouth daily. 90 tablet 1   dexamethasone (DECADRON) 4 MG tablet Take 2 tablets at breakfast for 3 days, starting the day after cisplatin 40 tablet 2   diclofenac Sodium (VOLTAREN) 1 % GEL Apply 4 g topically 4 (four) times daily. 50 g 2   ibuprofen (ADVIL) 200 MG tablet Take 400 mg by mouth every 6 (six) hours as needed for headache.     lidocaine-prilocaine (EMLA) cream Apply to affected area once (Patient taking differently: Apply 1 application topically daily as needed (port access).) 30 g 3   magnesium oxide (MAG-OX) 400 (240 Mg) MG tablet Take 1 tablet (400 mg total) by mouth daily. 30 tablet 3   ondansetron (ZOFRAN ODT) 4 MG disintegrating tablet Take 1-2 tablets (4-8 mg total) by mouth every 6 (six) hours as needed for nausea or vomiting. 30 tablet 1   ondansetron (ZOFRAN) 8 MG tablet Take 1 tablet (8 mg total) by mouth 2 (two) times daily as needed. Start on the third day after cisplatin chemotherapy. 30 tablet 1   pantoprazole (PROTONIX) 40 MG tablet Take 1 tablet (40 mg total) by mouth daily. 30 tablet 1   potassium chloride SA (KLOR-CON) 20 MEQ tablet Take 1 tablet (20 mEq total) by mouth daily. (Patient not taking: Reported on 09/21/2020) 30 tablet 0   prochlorperazine (COMPAZINE) 10 MG tablet Take 1 tablet (10 mg total) by mouth every 6 (six) hours as needed (Nausea or vomiting). 30 tablet 1   No current facility-administered medications for this visit.    PHYSICAL EXAMINATION: ECOG PERFORMANCE STATUS: 2 - Symptomatic, <50% confined to bed  There were no vitals filed for this visit. There were no vitals filed for this visit.  GENERAL:alert, no distress and comfortable SKIN: skin color, texture, turgor are  normal, no rashes or significant lesions EYES: normal, Conjunctiva are pink and non-injected, sclera clear  ABDOMEN:abdomen soft, non-tender and normal bowel sounds; liver slightly enlarged but smaller than prior. Musculoskeletal:no cyanosis of digits and no clubbing  NEURO: alert & oriented x 3 with fluent speech, no focal motor/sensory deficits  LABORATORY DATA:  I have reviewed the data as listed CBC Latest Ref Rng & Units 10/15/2020 09/27/2020 09/20/2020  WBC 4.0 - 10.5 K/uL 9.8 7.6 9.1  Hemoglobin 12.0 - 15.0 g/dL 10.9(L) 10.4(L) 11.2(L)  Hematocrit 36.0 - 46.0 % 33.4(L) 31.5(L) 33.9(L)  Platelets 150 - 400 K/uL 341 260 214     CMP Latest Ref Rng & Units 10/15/2020 09/27/2020 09/20/2020  Glucose 70 - 99 mg/dL 160(H) 185(H) 191(H)  BUN 6 - 20 mg/dL 11 12 10   Creatinine 0.44 - 1.00 mg/dL 0.65 0.53 0.60  Sodium 135 - 145 mmol/L 140 136 137  Potassium 3.5 - 5.1 mmol/L 4.0 3.8 4.0  Chloride 98 - 111 mmol/L 103 99 102  CO2 22 - 32 mmol/L 26 28 26   Calcium 8.9 - 10.3 mg/dL 9.4 9.1 9.3  Total Protein 6.5 - 8.1  g/dL 7.4 7.5 7.3  Total Bilirubin 0.3 - 1.2 mg/dL 0.4 0.4 0.3  Alkaline Phos 38 - 126 U/L 244(H) 236(H) 279(H)  AST 15 - 41 U/L 25 34 28  ALT 0 - 44 U/L 15 25 15       RADIOGRAPHIC STUDIES: I have personally reviewed the radiological images as listed and agreed with the findings in the report. No results found.   ASSESSMENT & PLAN:  Crystal Gibbs is a 45 y.o. female with   1. 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 -Her Liver biopsy from 07/26/20 showed adenocarcinoma. I discussed this type of cancer could be from liver primary or from bile duct primary called Cholangiocarcinoma. There is also possibility her primary cancer is elsewhere, although  imaging did not show other malignancy outside of liver. I dicussed scans have limitations. -Her 07/10/20 MRI abdomen also shows there is at least 1 satellite lesion centrally in the right lobe and her 07/30/20 CT Chest shows Pulmonary nodules are highly worrisome for metastatic disease. Given size of lung nodules, we can monitor with repeat scan in late summer 2022.  -Her Upper endoscopy by Dr [name] on 08/18/20 did not show malignancy.  -I discussed her case in recent GI tumor Board and based on the large size and central location of her liver mass, with satellite lesion, this cancer is unresectable. Given surgery is not an option, her cancer is no longer curable, but still treatable. Systemic chemo is her main treatable to control her disease and prolong her life.  -I started her on chemo with First line gemcitabine and cisplatin 2 weeks on/1 week off in addition to [employer] based on recent TOPAZ1 trial data. She started on 08/16/20. The goal of chemotherapy is palliative. -She is tolerating treatment well except her pre-existing fatigue, low appetite, and leg pain which have not improved much since she started chemo   -F/u in 3 weeks with restaging CT prior to visit.    2. Abdominal Pain, Hepatomegaly, secondary to #1 -She has had abdominal pain 2 months before diagnosis. The pain is not daily (mostly with eating and prolonged sitting) but discomfort is mostly daily. -Pain started while she was in [place] and Work up was concerning for malignancy. She chose to return to the [place] for more workup.  -Pain has much improved since she started chemo, continue monitoring and tramadol as needed. She has only needed occasionally. She also uses Tylenol. -She has hepatomegaly since 09/20/20 exam. Her Alk Phos is still elevated, but stable. She feels bloated today. Physical exam showed slightly smaller liver. (10/15/20)   3. Fatigue, Low Appetite  -Secondary to underlying malignancy and chemotherapy -She  reports good response to dexamethasone, which unfortunately would be contraindicated due to his immunotherapy. I recommend her not to use it after chemo.  She agrees. -I encouraged her to eat well and stay active physically -I also encouraged her to eat adequately with high protein, high calorie diet and use nutritional supplement.  -Given her RUQ pain and Acid Reflux, she is still eating less. So far able to maintain weight. I prescribed Protonix for her Acid Reflux on 09/27/20   4. Social and Acupuncturist -She lives in [place] with her husband, although she was recently frequently in [place]. She  is a [place] Citizen. -She and her husband does not speak much [place]. They require interpretor, one of who they listed as a point of contact.   -She is not working. He has job, but not currently working, as to help his wife. -She has [employer] health insurance. She does not have a PCP currently in the [place]. -Continue f/u with SW and financial advocate for support and to possibly apply for Medicare/Medicaid.   5. Genetic testing -Given her age and family history of GI cancer with her mother, she is eligible for genetic testing.  -Results negative with VUS in SDHA and RECQL4.   6. Right chest tightness and right arm pain -She notes right shoulder radiating from her right chest tightness and then radiates down arm. -She has Tramadol for her pain, she can continue or Tylenol/Ibuprofen.  -previously improved. Not mentioned again today.   7. Indeterminate lung nodules -will f/u on next scan      PLAN:  -C3 Cis/gem today and next week  -labs and f/u in 3 weeks, with restaging CT prior to visit    No problem-specific Assessment & Plan notes found for this encounter.   Orders Placed This Encounter  Procedures   CT CHEST ABDOMEN PELVIS W CONTRAST    Standing Status:   Future    Standing Expiration Date:   10/16/2021    Order Specific Question:   If indicated for the ordered procedure, I authorize  the administration of contrast media per Radiology protocol    Answer:   Yes    Order Specific Question:   Is patient pregnant?    Answer:   No    Order Specific Question:   Preferred imaging location?    Answer:   [facility]    Order Specific Question:   Is Oral Contrast requested for this exam?    Answer:   Yes, Per Radiology protocol    Order Specific Question:   Reason for Exam (SYMPTOM  OR DIAGNOSIS REQUIRED)    Answer:   evaluate response to chemo    All questions were answered. The patient knows to call the clinic with any problems, questions or concerns. No barriers to learning was detected. The total time spent in the appointment was 30 minutes.     [name], MD 10/15/2020  I, Wilburn Mylar, am acting as scribe for [name], MD.   I have reviewed the above documentation for accuracy and completeness, and I agree with the above.

## 2020-10-16 ENCOUNTER — Encounter: Payer: Self-pay | Admitting: Hematology

## 2020-10-16 LAB — CANCER ANTIGEN 19-9: CA 19-9: 57 U/mL — ABNORMAL HIGH (ref 0–35)

## 2020-10-16 LAB — T4: T4, Total: 8.1 ug/dL (ref 4.5–12.0)

## 2020-10-18 ENCOUNTER — Other Ambulatory Visit: Payer: 59

## 2020-10-18 ENCOUNTER — Ambulatory Visit: Payer: 59

## 2020-10-22 ENCOUNTER — Other Ambulatory Visit: Payer: Self-pay

## 2020-10-22 ENCOUNTER — Inpatient Hospital Stay: Payer: 59

## 2020-10-22 ENCOUNTER — Telehealth: Payer: Self-pay | Admitting: Hematology

## 2020-10-22 ENCOUNTER — Other Ambulatory Visit: Payer: Self-pay | Admitting: Hematology

## 2020-10-22 DIAGNOSIS — Z95828 Presence of other vascular implants and grafts: Secondary | ICD-10-CM

## 2020-10-22 DIAGNOSIS — Z5111 Encounter for antineoplastic chemotherapy: Secondary | ICD-10-CM | POA: Diagnosis not present

## 2020-10-22 DIAGNOSIS — C221 Intrahepatic bile duct carcinoma: Secondary | ICD-10-CM

## 2020-10-22 LAB — CMP (CANCER CENTER ONLY)
ALT: 21 U/L (ref 0–44)
AST: 28 U/L (ref 15–41)
Albumin: 3.1 g/dL — ABNORMAL LOW (ref 3.5–5.0)
Alkaline Phosphatase: 246 U/L — ABNORMAL HIGH (ref 38–126)
Anion gap: 7 (ref 5–15)
BUN: 12 mg/dL (ref 6–20)
CO2: 28 mmol/L (ref 22–32)
Calcium: 9 mg/dL (ref 8.9–10.3)
Chloride: 102 mmol/L (ref 98–111)
Creatinine: 0.66 mg/dL (ref 0.44–1.00)
GFR, Estimated: >60 mL/min (ref >60)
Glucose, Bld: 197 mg/dL — ABNORMAL HIGH (ref 70–99)
Potassium: 4 mmol/L (ref 3.5–5.1)
Sodium: 137 mmol/L (ref 135–145)
Total Bilirubin: 0.3 mg/dL (ref 0.3–1.2)
Total Protein: 7.2 g/dL (ref 6.5–8.1)

## 2020-10-22 LAB — CBC WITH DIFFERENTIAL (CANCER CENTER ONLY)
Abs Immature Granulocytes: 0.03 10*3/uL (ref 0.00–0.07)
Basophils Absolute: 0 10*3/uL (ref 0.0–0.1)
Basophils Relative: 1 %
Eosinophils Absolute: 0.1 10*3/uL (ref 0.0–0.5)
Eosinophils Relative: 1 %
HCT: 29.4 % — ABNORMAL LOW (ref 36.0–46.0)
Hemoglobin: 9.8 g/dL — ABNORMAL LOW (ref 12.0–15.0)
Immature Granulocytes: 1 %
Lymphocytes Relative: 32 %
Lymphs Abs: 1.7 10*3/uL (ref 0.7–4.0)
MCH: 30.2 pg (ref 26.0–34.0)
MCHC: 33.3 g/dL (ref 30.0–36.0)
MCV: 90.7 fL (ref 80.0–100.0)
Monocytes Absolute: 0.3 10*3/uL (ref 0.1–1.0)
Monocytes Relative: 5 %
Neutro Abs: 3.3 10*3/uL (ref 1.7–7.7)
Neutrophils Relative %: 60 %
Platelet Count: 181 10*3/uL (ref 150–400)
RBC: 3.24 MIL/uL — ABNORMAL LOW (ref 3.87–5.11)
RDW: 16.5 % — ABNORMAL HIGH (ref 11.5–15.5)
WBC Count: 5.3 10*3/uL (ref 4.0–10.5)
nRBC: 0 % (ref 0.0–0.2)

## 2020-10-22 LAB — TSH: TSH: 2.129 u[IU]/mL (ref 0.308–3.960)

## 2020-10-22 LAB — PREGNANCY, URINE: Preg Test, Ur: NEGATIVE

## 2020-10-22 LAB — MAGNESIUM: Magnesium: 1.6 mg/dL — ABNORMAL LOW (ref 1.7–2.4)

## 2020-10-22 MED ORDER — SODIUM CHLORIDE 0.9 % IV SOLN
Freq: Once | INTRAVENOUS | Status: AC
  Filled 2020-10-22: qty 250

## 2020-10-22 MED ORDER — MAGNESIUM SULFATE 2 GM/50ML IV SOLN
2.0000 g | Freq: Once | INTRAVENOUS | Status: AC
  Administered 2020-10-22: 2 g via INTRAVENOUS

## 2020-10-22 MED ORDER — ACETAMINOPHEN 325 MG PO TABS
650.0000 mg | ORAL_TABLET | Freq: Once | ORAL | Status: AC
  Administered 2020-10-22: 650 mg via ORAL

## 2020-10-22 MED ORDER — ACETAMINOPHEN 325 MG PO TABS
ORAL_TABLET | ORAL | Status: AC
  Filled 2020-10-22: qty 2

## 2020-10-22 MED ORDER — MAGNESIUM SULFATE 2 GM/50ML IV SOLN
INTRAVENOUS | Status: AC
  Filled 2020-10-22: qty 100

## 2020-10-22 MED ORDER — SODIUM CHLORIDE 0.9 % IV SOLN
1000.0000 mg/m2 | Freq: Once | INTRAVENOUS | Status: AC
  Administered 2020-10-22: 1672 mg via INTRAVENOUS
  Filled 2020-10-22: qty 43.97

## 2020-10-22 MED ORDER — SODIUM CHLORIDE 0.9 % IV SOLN
150.0000 mg | Freq: Once | INTRAVENOUS | Status: AC
  Administered 2020-10-22: 150 mg via INTRAVENOUS
  Filled 2020-10-22: qty 150

## 2020-10-22 MED ORDER — SODIUM CHLORIDE 0.9 % IV SOLN
1500.0000 mg | Freq: Once | INTRAVENOUS | Status: AC
  Administered 2020-10-22: 1500 mg via INTRAVENOUS
  Filled 2020-10-22: qty 30

## 2020-10-22 MED ORDER — SODIUM CHLORIDE 0.9 % IV SOLN
Freq: Once | INTRAVENOUS | Status: DC
  Filled 2020-10-22: qty 250

## 2020-10-22 MED ORDER — SODIUM CHLORIDE 0.9% FLUSH
10.0000 mL | Freq: Once | INTRAVENOUS | Status: AC
  Administered 2020-10-22: 10 mL
  Filled 2020-10-22: qty 10

## 2020-10-22 MED ORDER — SODIUM CHLORIDE 0.9 % IV SOLN
25.0000 mg/m2 | Freq: Once | INTRAVENOUS | Status: AC
  Administered 2020-10-22: 42 mg via INTRAVENOUS
  Filled 2020-10-22: qty 42

## 2020-10-22 MED ORDER — SODIUM CHLORIDE 0.9% FLUSH
10.0000 mL | INTRAVENOUS | Status: DC | PRN
  Administered 2020-10-22: 10 mL
  Filled 2020-10-22: qty 10

## 2020-10-22 MED ORDER — PALONOSETRON HCL INJECTION 0.25 MG/5ML
0.2500 mg | Freq: Once | INTRAVENOUS | Status: AC
  Administered 2020-10-22: 0.25 mg via INTRAVENOUS

## 2020-10-22 MED ORDER — HEPARIN SOD (PORK) LOCK FLUSH 100 UNIT/ML IV SOLN
500.0000 [IU] | Freq: Once | INTRAVENOUS | Status: AC | PRN
  Administered 2020-10-22: 500 [IU]
  Filled 2020-10-22: qty 5

## 2020-10-22 MED ORDER — POTASSIUM CHLORIDE IN NACL 20-0.9 MEQ/L-% IV SOLN
Freq: Once | INTRAVENOUS | Status: AC
Start: 2020-10-22 — End: 2020-10-22
  Filled 2020-10-22: qty 1000

## 2020-10-22 MED ORDER — PALONOSETRON HCL INJECTION 0.25 MG/5ML
INTRAVENOUS | Status: AC
  Filled 2020-10-22: qty 5

## 2020-10-22 NOTE — Patient Instructions (Addendum)
[facility]  Discharge Instructions: Thank you for choosing [facility] to provide your oncology and hematology care.   If you have a lab appointment with the [facility], please go directly to the [facility] and check in at the registration area.   Wear comfortable clothing and clothing appropriate for easy access to any Portacath or PICC line.   We strive to give you quality time with your provider. You may need to reschedule your appointment if you arrive late (15 or more minutes).  Arriving late affects you and other patients whose appointments are after yours.  Also, if you miss three or more appointments without notifying the office, you may be dismissed from the clinic at the provider's discretion.      For prescription refill requests, have your pharmacy contact our office and allow 72 hours for refills to be completed.    Today you received the following chemotherapy and/or immunotherapy agents : Cisplatin, Gemzar; Imfinzi   To help prevent nausea and vomiting after your treatment, we encourage you to take your nausea medication as directed.  BELOW ARE SYMPTOMS THAT SHOULD BE REPORTED IMMEDIATELY: *FEVER GREATER THAN 100.4 F (38 C) OR HIGHER *CHILLS OR SWEATING *NAUSEA AND VOMITING THAT IS NOT CONTROLLED WITH YOUR NAUSEA MEDICATION *UNUSUAL SHORTNESS OF BREATH *UNUSUAL BRUISING OR BLEEDING *URINARY PROBLEMS (pain or burning when urinating, or frequent urination) *BOWEL PROBLEMS (unusual diarrhea, constipation, pain near the anus) TENDERNESS IN MOUTH AND THROAT WITH OR WITHOUT PRESENCE OF ULCERS (sore throat, sores in mouth, or a toothache) UNUSUAL RASH, SWELLING OR PAIN  UNUSUAL VAGINAL DISCHARGE OR ITCHING   Items with * indicate a potential emergency and should be followed up as soon as possible or go to the Emergency Department if any problems should occur.  Please show the CHEMOTHERAPY ALERT CARD or IMMUNOTHERAPY ALERT CARD  at check-in to the Emergency Department and triage nurse.  Should you have questions after your visit or need to cancel or reschedule your appointment, please contact [facility]  Dept: [phone]  and follow the prompts.  Office hours are 8:00 a.m. to 4:30 p.m. Monday - Friday. Please note that voicemails left after 4:00 p.m. may not be returned until the following business day.  We are closed weekends and major holidays. You have access to a nurse at all times for urgent questions. Please call the main number to the clinic Dept: [phone] and follow the prompts.   For any non-urgent questions, you may also contact your provider using MyChart. We now offer e-Visits for anyone 79 and older to request care online for non-urgent symptoms. For details visit [email or web address].   Also download the MyChart app! Go to the app store, search "MyChart", open the app, select [facility], and log in with your MyChart username and password.  Due to Covid, a mask is required upon entering the hospital/clinic. If you do not have a mask, one will be given to you upon arrival. For doctor visits, patients may have 1 support person aged 79 or older with them. For treatment visits, patients cannot have anyone with them due to current Covid guidelines and our immunocompromised population.

## 2020-10-22 NOTE — Telephone Encounter (Signed)
Scheduled appointment per 07/15 sch msg. Patient is aware. 

## 2020-10-23 ENCOUNTER — Inpatient Hospital Stay: Payer: 59

## 2020-10-23 LAB — CANCER ANTIGEN 19-9: CA 19-9: 49 U/mL — ABNORMAL HIGH (ref 0–35)

## 2020-10-23 LAB — T4: T4, Total: 8.2 ug/dL (ref 4.5–12.0)

## 2020-10-25 ENCOUNTER — Other Ambulatory Visit: Payer: Self-pay

## 2020-10-25 ENCOUNTER — Inpatient Hospital Stay: Payer: 59

## 2020-10-25 VITALS — BP 118/90 | HR 100 | Temp 97.9°F | Resp 18

## 2020-10-25 DIAGNOSIS — Z5111 Encounter for antineoplastic chemotherapy: Secondary | ICD-10-CM | POA: Diagnosis not present

## 2020-10-25 DIAGNOSIS — C221 Intrahepatic bile duct carcinoma: Secondary | ICD-10-CM

## 2020-10-25 MED ORDER — PEGFILGRASTIM INJECTION 6 MG/0.6ML ~~LOC~~
6.0000 mg | PREFILLED_SYRINGE | Freq: Once | SUBCUTANEOUS | Status: AC
  Administered 2020-10-25: 6 mg via SUBCUTANEOUS
  Filled 2020-10-25: qty 0.6

## 2020-10-25 MED ORDER — PEGFILGRASTIM 6 MG/0.6ML ~~LOC~~ PSKT
PREFILLED_SYRINGE | SUBCUTANEOUS | Status: AC
  Filled 2020-10-25: qty 0.6

## 2020-10-25 NOTE — Progress Notes (Signed)
MD Burr Medico confirmed pt can receive neulasta inj today.

## 2020-10-25 NOTE — Patient Instructions (Signed)
Pegfilgrastim injection ?? ??? ??????? ????????????? ?? ???? ????? ???? ??????? ???????? ??????? ??????? ????? ?????? ??? ??????? ???? ?? ??? ?? ????? ???? ??????? ??? ??????? ?? ?????? ????? ?? ??????. ??? ?????? ???? ?? ??????? ?????? ??????? ?? ?????? ???????? ?????? ????? ?? ??????? ??????? ?????? ????????? ???? ??? ???? ?????? ??????? ?????????? ??? ?????? ?????? ????? ?? ???????. ???? ??????? ??? ?????? ?????? ????? ???? ???? ??????? ?????? ?? ??????? ??????? ???? ?????. ????? ???????? ???????? ????????:? Fulphila, Neulasta, Nyvepria, UDENYCA,Ziextenzo ?? ?? ??????? ???? ??? ?? ???? ??? ???? ??????? ?????? ??? ????? ??? ??????? ?? ????? ??? ????? ?? ??? ??? ?????? ??? ?? ??? ??????? ????: ????? ????? ???????? ???? ??????? ???? ?????? ????? ???????? ???????? ??????? ????? ???? ?????? ??????? ??????????? (???? ????? ??? ????? ???) ?? ??? ??? ????? ?? ?????? ?? ????????????? ?? ?????????? ?? ??? ??? ???? ?? ?????? ?? ??????? ?????? ?? ??????? ?? ??????? ?? ?????? ??????? ???? ?? ????? ????? ??????? ???????? ??? ??? ???? ??????? ??? ??????? ??? ?????? ???? ????? ??? ?????. ??? ??? ????? ??? ?????? ?? ??????? ????? ?????? ????? ????? ?????? ?????? ???????? ?????? ?? ????? ??????? ???? ????? ??? ?????. ???? ??? ??????? ????????? ?????? ??????? ??????? ??? ????????? ?????????. ?????? ??? ?? ????? ??????. ???? ????? ?? ?????? ??????? ?????? ??? ????? ??? ??? ??? ?? ??? ???? "???? ????? ??? ?????" ?????? ?????? ?? ??? ?????? ?? ?? ??????? "???? ????? ??? ?????" ?? ??? ??? ???? ????? ?? ?????. ?? ????? ?? ??? ????? ?????? ????????? ?? ???? ???? ?????? ?? ??????? ??????. ?? ????? ?? ????? ?????. ??? ?? ??? ???? ???? ??????? ??????? ???? ??????? ???????? ??????? ?????? ?????? ??? ?????. ???? ??? ???? ??????? ???? ??????? ??? ?????? ???????. ?? ??? ?? ??? ?????????? ???? ?????? ?????? ??? ??? ???? ????? ?????????? ???????. ?????? ??????? : ??? ?????? ??? ?????? ???? ????? ???? ?? ??? ??????? ?????????  ?????? ?? ?????? ?? ???? ??????? ?????. ??????: ?????? ??? ?????? ?? ???? ??? ???. ?? ????? ????? ??? ?? ????? ?????????. ???? ?? ???? (????) ????? ?? ????? ??? ???? ????. ??? ????? ????? ???? ?????? ?? ??????? ??????? ??????. ??? ????? ???? ???? ???? ??? ?? ????? ?? ???? ????? ??? ?????? ??? ????? ???? ????? ?? ???? ??? ???? ???????? ???? ????? ?????? ?????? ?????? ???????????????. ?? ?? ??????? ???? ?? ?????? ?? ??? ??????? ?? ???? ?????????. ??? ??????? ?? ?? ??? ?? ????????? ????????. ?? ?????? ???? ??????? ?????? ????? ??? ??????? ?? ??????? ?? ??????? ???? ???? ?? ???????? ???????? ???? ????????. ?????? ????? ??? ??? ???? ?? ???? ?????? ?? ?????? ?????? ??????????. ?? ?????? ??? ??????? ?? ?????. ?? ???? ??? ???? ????? ??? ??????? ??? ??????? ??? ??? ??????? ?? ??? ????? ???? ????? ??????. ??? ????? ??? ????? ???????? ?????? ???? ????? ?????? ???? ??????. ???? ??? ????? ?? ?????? ??????? ?????? ?? ??? ?????? ????????. ?? ???? ??????????? ????? ???? ??????? ?????? ????? ?? ??????? ??? ?????? ??? ??????. ??? ??? ?? ???? ?????? ???? ??????? ?????????? ?? ???????? ??????? ??????? ???? ????? ???? ????? ????? ???? ?????? ??? ?????? (???? ????? ??? ????? ???). ?? ?? ?????? ???????? ???? ???? ?? ??????? ??? ???? ??? ??????? ?????? ???????? ???? ??? ?? ???? ????? ?? ?????? ??????? ?????? ??? ?? ???? ???????: ??????? ???????? (????? ??????? ?? ?????? ?? ?????????? (?????)? ???? ????? ?? ?????? ?? ??????) ??? ????? ?????? ?????? ??? ?? ?????? ?? ??? ?? ?????? ?? ????? ?????? ??? ????? ???? ?????? ??? ????? ???? ????? ?????? ?????? ?? ????? ?????? ??? ????? ?? ????? ?? ?????? ??? ?????? ?? ???? ????? ?? ??? ?? ????? ??? ?????? ?????? ????? ?????? ?? ???? ?? ???? ????? ???? ?? ????? ??? ?????? ?????? ???????? ???? ?? ????? ????? ???? (???? ????? ?? ?????? ??????? ????????? ?????? ?? ???? ????? ?????): ??? ?????? ????? ?? ???? ??????? ??? ??????? ?? ?? ??? ?? ?????? ???????? ????????. ????  ?????? ????????? ?????? ?? ?????? ????????. ????? ??????? ?? ?????? ???????? ?????? ??????? ????????????????? (  FDA) ??? ????? ?[id number]??? ??? ??? ???? ???????? ??????? ???? ?????? ?? ?????? ???????. ??? ??? ?????? ??? ?????? ?? ??????? ???? ?????? ??? ????? ??????. ???? ?? ?????? ??? ?????? ??? ????? ?????? ???????? ??????? ??? ?????? ?? ??????. ??????: ??? ??????? ????? ?? ????: ?? ?? ???? ??? ???????? ?? ????????? ???????. ??? ???? ???? ?? ????? ?? ??? ??????? ????? ??? ?????? ?? ??????? ?????? ??????? ??????.  2022 Elsevier/Gold Standard (2019-09-30 00:00:00)

## 2020-10-26 ENCOUNTER — Encounter: Payer: Self-pay | Admitting: *Deleted

## 2020-10-26 NOTE — Progress Notes (Signed)
Crystal Gibbs  Patient and patients niece came to [facility] office with questions regarding status of food stamps application and social security disability status.  Patients niece stated patient had applied for food stamps a few weeks ago and had e-mail contact for the caseworker.  CSW does not have access to DSS/food stamp information.  CSW encouraged patient to e-mail caseworker to request update on status of application.  Patient stated she received a packet in the mail from social security.  Patient completed requested information and returned via mail.  Patient stated her medicaid cannot be approved until there has been a determination on her disability application.  CSW emailed [employer], who assisted patient in the [facility] application process.  [name] with the [facility] responded that she would check the status of patients claim and call patient directly.  CSW encouraged patient and patients niece to call with additional questions or concerns.  [name], MSW, LCSW, OSW-C Clinical Social Worker [facility] [phone]

## 2020-10-31 NOTE — Progress Notes (Signed)
    SUBJECTIVE:   CHIEF COMPLAINT / HPI: Left-sided abdominal  pain   Left Flank Pain  Patient reports that for the last few days she has been having increasing left-sided abdominal pain associated with meals.  She is not identified any specific food triggers that cause the pain.  She describes the pain as dull and achy.  She reports that she discussed this pain with her oncologist who said that it may be related to her tumor burden.  Patient states that she often has worsening nausea associated with this.  She denies any dysuria or abnormal color to her urine.  She reports having regular bowel movements that have not been characterized as hematochezia or melena.  PERTINENT  PMH / PSH:  Hepatic carcinoma   OBJECTIVE:   BP 110/80   Pulse (!) 105   Ht '5\' 6"'$  (1.676 m)   Wt 147 lb (66.7 kg)   SpO2 99%   BMI 23.73 kg/m   General: female appearing stated age in no acute distress Cardio: Normal S1 and S2, no S3 or S4. Rhythm is regular. No murmurs or rubs.  Bilateral radial pulses palpable Pulm: Clear to auscultation bilaterally, no crackles, wheezing, or diminished breath sounds. Normal respiratory effort, stable on RA Abdomen: Bowel sounds normal. Abdomen soft, tenderness in L upper and mid quadrants, no left-sided flank pain, no edema, no skin changes Extremities: No peripheral edema. Warm/ well perfused   ASSESSMENT/PLAN:   Left sided abdominal pain Given patient's large tumor burden, will discuss referral to palliative care for symptom management with patient appears to be having large symptom burden related to chemotherapy in tumor burden. -Prescribed MiraLAX - U/A  - patient scheduled for abdominal imaging scheduled for 11/04/20, encouraged importance of attending this  - ref to outpatient palliative care      [name], MD [facility]

## 2020-11-01 ENCOUNTER — Encounter: Payer: Self-pay | Admitting: Family Medicine

## 2020-11-01 ENCOUNTER — Ambulatory Visit: Payer: 59 | Admitting: Family Medicine

## 2020-11-01 ENCOUNTER — Telehealth: Payer: Self-pay | Admitting: Hematology

## 2020-11-01 ENCOUNTER — Other Ambulatory Visit: Payer: Self-pay

## 2020-11-01 VITALS — BP 110/80 | HR 105 | Ht 66.0 in | Wt 147.0 lb

## 2020-11-01 DIAGNOSIS — C221 Intrahepatic bile duct carcinoma: Secondary | ICD-10-CM

## 2020-11-01 DIAGNOSIS — R109 Unspecified abdominal pain: Secondary | ICD-10-CM | POA: Diagnosis not present

## 2020-11-01 LAB — POCT UA - MICROSCOPIC ONLY

## 2020-11-01 LAB — POCT URINALYSIS DIP (MANUAL ENTRY)
Bilirubin, UA: NEGATIVE
Blood, UA: NEGATIVE
Glucose, UA: NEGATIVE mg/dL
Ketones, POC UA: NEGATIVE mg/dL
Nitrite, UA: NEGATIVE
Protein Ur, POC: NEGATIVE mg/dL
Spec Grav, UA: 1.025 (ref 1.010–1.025)
Urobilinogen, UA: 0.2 E.U./dL
pH, UA: 6.5 (ref 5.0–8.0)

## 2020-11-01 MED ORDER — POLYETHYLENE GLYCOL 3350 17 GM/SCOOP PO POWD: 17.0000 g | Freq: Two times a day (BID) | ORAL | 1 refills | Status: AC | PRN

## 2020-11-01 NOTE — Assessment & Plan Note (Signed)
Given patient's large tumor burden, will discuss referral to palliative care for symptom management with patient appears to be having large symptom burden related to chemotherapy in tumor burden. -Prescribed MiraLAX - U/A  - patient scheduled for abdominal imaging scheduled for 11/04/20, encouraged importance of attending this  - ref to outpatient palliative care

## 2020-11-01 NOTE — Telephone Encounter (Signed)
Scheduled appointment per 07/25 sch msg. Patient is aware. 

## 2020-11-01 NOTE — Patient Instructions (Addendum)
I have submitted a referral to palliative care to help with symptom management due to your tumor burden and treatment associated symptoms.  I have also prescribed MiraLAX  We will also complete urine studies today to see if there is any possibility that your symptoms may be due to any urinary problems.  It will be important to keep your appointment on 7/28 for your abdominal imaging so that we can see what may be contributing to your pain on the left side.  Please plan to follow-up with me in the next 3-4 weeks.  Thoracic Strain Rehab Ask your health care provider which exercises are safe for you. Do exercises exactly as told by your health care provider and adjust them as directed. It is normal to feel mild stretching, pulling, tightness, or discomfort as you do these exercises. Stop right away if you feel sudden pain or your pain gets worse. Do not begin these exercises until told by your health care provider. Stretching and range-of-motion exercise This exercise warms up your muscles and joints and improves the movement and flexibility of your back and shoulders. This exercise also helps to relievepain. Chest and spine stretch  Lie down on your back on a firm surface. Roll a towel or a small blanket so it is about 4 inches (10 cm) in diameter. Put the towel lengthwise under the middle of your back so it is under your spine, but not under your shoulder blades. Put your hands behind your head and let your elbows fall to your sides. This will increase your stretch. Take a deep breath (inhale). Hold for __________ seconds. Relax after you breathe out (exhale). Repeat __________ times. Complete this exercise __________ times a day. Strengthening exercises These exercises build strength and endurance in your back and your shoulder blade muscles. Endurance is the ability to use your muscles for a long time,even after they get tired. Alternating arm and leg raises  Get on your hands and knees  on a firm surface. If you are on a hard floor, you may want to use padding, such as an exercise mat, to cushion your knees. Line up your arms and legs. Your hands should be directly below your shoulders, and your knees should be directly below your hips. Lift your left leg behind you. At the same time, raise your right arm and straighten it in front of you. Do not lift your leg higher than your hip. Do not lift your arm higher than your shoulder. Keep your abdominal and back muscles tight. Keep your hips facing the ground. Do not arch your back. Keep your balance carefully, and do not hold your breath. Hold for __________ seconds. Slowly return to the starting position and repeat with your right leg and your left arm. Repeat __________ times. Complete this exercise __________ times a day. Straight arm rows This exercise is also called shoulder extension exercise. Stand with your feet shoulder width apart. Secure an exercise band to a stable object in front of you so the band is at or above shoulder height. Hold one end of the exercise band in each hand. Straighten your elbows and lift your hands up to shoulder height. Step back, away from the secured end of the exercise band, until the band stretches. Squeeze your shoulder blades together and pull your hands down to the sides of your thighs. Stop when your hands are straight down by your sides. This is shoulder extension. Do not let your hands go behind your body. Hold for  __________ seconds. Slowly return to the starting position. Repeat __________ times. Complete this exercise __________ times a day. Prone shoulder external rotation Lie on your abdomen on a firm bed so your left / right forearm hangs over the edge of the bed and your upper arm is on the bed, straight out from your body. This is the prone position. Your elbow should be bent. Your palm should be facing your feet. If instructed, hold a __________ weight in your  hand. Squeeze your shoulder blade toward the middle of your back. Do not let your shoulder lift toward your ear. Keep your elbow bent in a 90-degree angle (right angle) while you slowly move your forearm up toward the ceiling. Move your forearm up to the height of the bed, toward your head. This is external rotation. Your upper arm should not move. At the top of the movement, your palm should face the floor. Hold for __________ seconds. Slowly return to the starting position and relax your muscles. Repeat __________ times. Complete this exercise __________ times a day. Rowing scapular retraction This is an exercise in which the shoulder blades (scapulae) are pulled toward each other (retraction). Sit in a stable chair without armrests, or stand up. Secure an exercise band to a stable object in front of you so the band is at shoulder height. Hold one end of the exercise band in each hand. Your palms should face down. Bring your arms out straight in front of you. Step back, away from the secured end of the exercise band, until the band stretches. Pull the band backward. As you do this, bend your elbows and squeeze your shoulder blades together, but avoid letting the rest of your body move. Do not shrug your shoulders upward while you do this. Stop when your elbows are at your sides or slightly behind your body. Hold for __________ seconds. Slowly straighten your arms to return to the starting position. Repeat __________ times. Complete this exercise __________ times a day. Posture and body mechanics Good posture and healthy body mechanics can help to relieve stress in your body's tissues and joints. Body mechanics refers to the movements and positions of your body while you do your daily activities. Posture is part of body mechanics. Good posture means: Your spine is in its natural S-curve position (neutral). Your shoulders are pulled back slightly. Your head is not tipped forward. Follow  these guidelines to improve your posture and body mechanics in youreveryday activities. Standing  When standing, keep your spine neutral and your feet about hip width apart. Keep a slight bend in your knees. Your ears, shoulders, and hips should line up with each other. When you do a task in which you lean forward while standing in one place for a long time, place one foot up on a stable object that is 2-4 inches (5-10 cm) high, such as a footstool. This helps keep your spine neutral.  Sitting  When sitting, keep your spine neutral and keep your feet flat on the floor. Use a footrest, if necessary, and keep your thighs parallel to the floor. Avoid rounding your shoulders, and avoid tilting your head forward. When working at a desk or a computer, keep your desk at a height where your hands are slightly lower than your elbows. Slide your chair under your desk so you are close enough to maintain good posture. When working at a computer, place your monitor at a height where you are looking straight ahead and you do not have  to tilt your head forward or downward to look at the screen.  Resting When lying down and resting, avoid positions that are most painful for you. If you have pain with activities such as sitting, bending, stooping, or squatting (flexion-basedactivities), lie in a position in which your body does not bend very much. For example, avoid curling up on your side with your arms and knees near your chest (fetal position). If you have pain with activities such as standing for a long time or reaching with your arms (extension-basedactivities), lie with your spine in a neutral position and bend your knees slightly. Try the following positions: Lie on your side with a pillow between your knees. Lie on your back with a pillow under your knees.  Lifting  When lifting objects, keep your feet at least shoulder width apart and tighten your abdominal muscles. Bend your knees and hips and keep  your spine neutral. It is important to lift using the strength of your legs, not your back. Do not lock your knees straight out. Always ask for help to lift heavy or awkward objects.  This information is not intended to replace advice given to you by your health care provider. Make sure you discuss any questions you have with your healthcare provider. Document Revised: 07/19/2018 Document Reviewed: 05/06/2018 Elsevier Patient Education  [employer].

## 2020-11-02 ENCOUNTER — Telehealth: Payer: Self-pay

## 2020-11-02 NOTE — Telephone Encounter (Signed)
Attempted to contact patient to schedule a Palliative Care consult appointment. No answer left a message to return call. Mychart message sent also.

## 2020-11-03 ENCOUNTER — Telehealth: Payer: Self-pay

## 2020-11-03 NOTE — Telephone Encounter (Signed)
Spoke with patient and scheduled an in-person Palliative Consult for 12/06/20 @ 12:30PM.  COVID screening was negative. No pets in home. Patient lives with husband.  Consent obtained; updated Outlook/Netsmart/Team List and Epic.   Family is aware they may be receiving a call from provider the day before or day of to confirm appointment.

## 2020-11-03 NOTE — Telephone Encounter (Signed)
Opened in error

## 2020-11-04 ENCOUNTER — Inpatient Hospital Stay: Payer: 59

## 2020-11-04 ENCOUNTER — Other Ambulatory Visit: Payer: Self-pay

## 2020-11-04 ENCOUNTER — Ambulatory Visit (HOSPITAL_COMMUNITY)
Admission: RE | Admit: 2020-11-04 | Discharge: 2020-11-04 | Disposition: A | Discharge status: Home / Self Care | Payer: 59 | Source: Ambulatory Visit | Attending: Hematology | Admitting: Hematology

## 2020-11-04 DIAGNOSIS — C221 Intrahepatic bile duct carcinoma: Secondary | ICD-10-CM

## 2020-11-04 DIAGNOSIS — Z5111 Encounter for antineoplastic chemotherapy: Secondary | ICD-10-CM | POA: Diagnosis not present

## 2020-11-04 DIAGNOSIS — Z95828 Presence of other vascular implants and grafts: Secondary | ICD-10-CM

## 2020-11-04 LAB — PREGNANCY, URINE: Preg Test, Ur: NEGATIVE

## 2020-11-04 LAB — CMP (CANCER CENTER ONLY)
ALT: 10 U/L (ref 0–44)
AST: 23 U/L (ref 15–41)
Albumin: 3.6 g/dL (ref 3.5–5.0)
Alkaline Phosphatase: 274 U/L — ABNORMAL HIGH (ref 38–126)
Anion gap: 9 (ref 5–15)
BUN: 9 mg/dL (ref 6–20)
CO2: 28 mmol/L (ref 22–32)
Calcium: 9.8 mg/dL (ref 8.9–10.3)
Chloride: 102 mmol/L (ref 98–111)
Creatinine: 0.67 mg/dL (ref 0.44–1.00)
GFR, Estimated: >60 mL/min (ref >60)
Glucose, Bld: 94 mg/dL (ref 70–99)
Potassium: 4.1 mmol/L (ref 3.5–5.1)
Sodium: 139 mmol/L (ref 135–145)
Total Bilirubin: 0.3 mg/dL (ref 0.3–1.2)
Total Protein: 7.8 g/dL (ref 6.5–8.1)

## 2020-11-04 LAB — CBC WITH DIFFERENTIAL (CANCER CENTER ONLY)
Abs Immature Granulocytes: 0.41 10*3/uL — ABNORMAL HIGH (ref 0.00–0.07)
Basophils Absolute: 0 10*3/uL (ref 0.0–0.1)
Basophils Relative: 0 %
Eosinophils Absolute: 0.1 10*3/uL (ref 0.0–0.5)
Eosinophils Relative: 1 %
HCT: 31 % — ABNORMAL LOW (ref 36.0–46.0)
Hemoglobin: 10.2 g/dL — ABNORMAL LOW (ref 12.0–15.0)
Immature Granulocytes: 4 %
Lymphocytes Relative: 23 %
Lymphs Abs: 2.3 10*3/uL (ref 0.7–4.0)
MCH: 30.4 pg (ref 26.0–34.0)
MCHC: 32.9 g/dL (ref 30.0–36.0)
MCV: 92.3 fL (ref 80.0–100.0)
Monocytes Absolute: 0.8 10*3/uL (ref 0.1–1.0)
Monocytes Relative: 8 %
Neutro Abs: 6.4 10*3/uL (ref 1.7–7.7)
Neutrophils Relative %: 64 %
Platelet Count: 240 10*3/uL (ref 150–400)
RBC: 3.36 MIL/uL — ABNORMAL LOW (ref 3.87–5.11)
RDW: 18.4 % — ABNORMAL HIGH (ref 11.5–15.5)
WBC Count: 10.2 10*3/uL (ref 4.0–10.5)
nRBC: 0.3 % — ABNORMAL HIGH (ref 0.0–0.2)

## 2020-11-04 LAB — MAGNESIUM: Magnesium: 1.7 mg/dL (ref 1.7–2.4)

## 2020-11-04 LAB — TSH: TSH: 2.065 u[IU]/mL (ref 0.308–3.960)

## 2020-11-04 MED ORDER — HEPARIN SOD (PORK) LOCK FLUSH 100 UNIT/ML IV SOLN
INTRAVENOUS | Status: AC
  Administered 2020-11-04: 500 [IU]
  Filled 2020-11-04: qty 5

## 2020-11-04 MED ORDER — SODIUM CHLORIDE 0.9% FLUSH
10.0000 mL | Freq: Once | INTRAVENOUS | Status: AC
  Administered 2020-11-04: 10 mL
  Filled 2020-11-04: qty 10

## 2020-11-04 MED ORDER — HEPARIN SOD (PORK) LOCK FLUSH 100 UNIT/ML IV SOLN: 500.0000 [IU] | Freq: Once | INTRAVENOUS | Status: DC

## 2020-11-04 MED ORDER — IOHEXOL 350 MG/ML SOLN
100.0000 mL | Freq: Once | INTRAVENOUS | Status: AC | PRN
  Administered 2020-11-04: 80 mL via INTRAVENOUS

## 2020-11-04 MED ORDER — SODIUM CHLORIDE (PF) 0.9 % IJ SOLN
INTRAMUSCULAR | Status: AC
  Filled 2020-11-04: qty 50

## 2020-11-04 NOTE — Progress Notes (Signed)
Crystal Gibbs   [phone] [phone]   Clinic Follow up Note   Patient Care Team: [name], MD as PCP - General (Family Medicine) [name], MD as Consulting Physician (Oncology) [name], RN (Inactive) as Oncology Nurse Navigator  Date of Service:  10/15/2020  CHIEF COMPLAINT: f/u of intrahepatic cholangiocarcinoma  SUMMARY OF ONCOLOGIC HISTORY: Oncology History Overview Note  Cancer Staging Intrahepatic cholangiocarcinoma ([place]) Staging form: Intrahepatic Bile Duct, AJCC 8th Edition - Clinical: Stage IB (cT1b, cN0, cM0) - Signed by [name], MD on 08/04/2020    Intrahepatic cholangiocarcinoma ([place])  08/19/2019 Procedure   Upper Endoscopy by Dr [name]  IMPRESSION - Normal esophagus. - Z-line regular, 35 cm from the incisors. - Gastritis. Biopsied. - Normal incisura, antrum and pylorus. - Erythematous duodenopathy. Biopsied. - Normal second portion of the duodenum and third portion of the duodenum. - No evidence of primary malignancy site noted on this study.   07/10/2020 Imaging   US Abdomen 07/10/20 IMPRESSION: 1.  No acute hepatobiliary findings.   2. Heterogeneous liver echotexture with 3 masslike areas as described measuring 4.3 cm, 5.4 cm and 6.4 cm respectively. Recommend CT abdomen/pelvis with intravenous contrast for further evaluation.   07/10/2020 Imaging   CT AP 07/10/20  IMPRESSION: 1. There is a large masslike collection or mass in the central liver measuring at least 10.9 x 7.1 x 10.2 cm with 2 smaller adjacent satellite lesions/collections measuring 11 and 17 mm. These findings may represent a large abscess with satellite abscesses or a large neoplasm/malignancy. Recommend MRI for further evaluation. 2. Left renal cyst. 3. Calcified atherosclerosis in the distal abdominal aorta is mild. 4. Fibroid uterus.   07/10/2020 Imaging   MRI Abdomen 07/10/20  IMPRESSION: 1. The large central hepatic mass  does not demonstrate signal characteristics or enhancement typical of a hemangioma or abscess, and is suspicious for malignancy. There is at least 1 satellite lesion centrally in the right lobe. As there are no signs of underlying cirrhosis, primary considerations include peripheral cholangiocarcinoma and metastatic disease. Tissue sampling recommended. 2. Mild intrahepatic biliary dilatation within the left hepatic lobe. Distortion of the hepatic vasculature without evidence of tumor thrombus. 3. No extrahepatic metastatic disease or primary malignancy identified within the abdomen.   07/26/2020 Imaging   CT AP 07/26/20  IMPRESSION: 1. Markedly poorly visualized and ill-defined 12 cm lesion within the liver. This lesion is better evaluated on MR abdomen 07/10/2020. 2. Otherwise no acute intra-abdominal abnormality with markedly limited evaluation on this noncontrast study. 3.  Aortic Atherosclerosis (ICD10-I70.0).   07/26/2020 Initial Diagnosis   FINAL MICROSCOPIC DIAGNOSIS: 07/26/20  A. LIVER MASS, LEFT, NEEDLE CORE BIOPSY:  - Adenocarcinoma.   ADDENDUM: The adenocarcinoma is positive with immunohistochemistry for cytokeratin 7 and PAX 8.  The carcinoma is negative with cytokeratin 20, CDX 2, estrogen receptor, GATA3, GCDFP, monoclonal and polyclonal CEA, cytokeratin 5/6, TTF-1, Napsin A, HepPar1, arginase 1 and WT1.  The morphology and immunophenotype are consistent with adenocarcinoma.  The differential for possible primary sites is broad including kidney although the morphology is not typical for more common types of renal cell carcinoma.  Pancreaticobiliary and gynecologic cannot be ruled out.    07/30/2020 Imaging   CT Chest 07/30/20  IMPRESSION: 1. Pulmonary nodules are highly worrisome for metastatic disease. 2. Hepatic masses are consistent with biopsy-proven adenocarcinoma and better evaluated on MR abdomen 07/10/2020.   08/04/2020 Initial Diagnosis   Intrahepatic  cholangiocarcinoma ([place])    08/04/2020 Cancer Staging  Staging form: Intrahepatic Bile Duct, AJCC 8th Edition - Clinical: Stage IB (cT1b, cN0, cM0) - Signed by [name], MD on 08/04/2020    08/13/2020 Procedure   PAC placement   08/16/2020 -  Chemotherapy   First line gemcitabine and cisplatin 2 weeks on/1 week off starting 08/16/20     08/16/2020 -  Chemotherapy   -Addition of durvalumab q3weeks with first-line chemo starting 08/16/20    08/18/2020 Procedure   Upper Endoscopy by Dr [name] IMPRESSION - Normal esophagus. - Z-line regular, 35 cm from the incisors. - Gastritis. Biopsied. - Normal incisura, antrum and pylorus. - Erythematous duodenopathy. Biopsied. - Normal second portion of the duodenum and third portion of the duodenum. - No evidence of primary malignancy site noted on this study.   08/18/2020 Pathology Results   FINAL MICROSCOPIC DIAGNOSIS:   A. DUODENUM, BULB, BIOPSY:  -  Erosive and reactive duodenal mucosa with Brunner's gland hyperplasia and mixed inflammation  -  No dysplasia or malignancy identified   B. STOMACH, BIOPSY:  -  Chronic active gastritis  -  H. pylori organisms present  -  No intestinal metaplasia identified  -  See comment   COMMENT:  Warthin-Starry stain is POSITIVE for organisms consistent with  Helicobacter pylori.    09/08/2020 Genetic Testing   Negative hereditary cancer genetic testing: no pathogenic variants detected in [facility] Multi-Cancer Panel + Pancreatitis Genes.  Variants of uncertain significance detected in RECQL4 at c.119-20C>A (Intronic) and SDHA at c.1039A>G (p.Met347Val).  The report date is September 08, 2020.    The Multi-Cancer Panel with pancreatitis genes offered by [facility] includes sequencing and/or deletion duplication testing of the following 89 genes: AIP, ALK, APC, ATM, AXIN2,BAP1,  BARD1, BLM, BMPR1A, BRCA1, BRCA2, BRIP1, CASR, CDC73, CDH1, CDK4, CDKN1B, CDKN1C, CDKN2A (p14ARF), CDKN2A (p16INK4a), CEBPA, CFTR, CHEK2,  CPA1, CTNNA1, CTRC, DICER1, DIS3L2, EGFR (c.2369C>T, p.Thr790Met variant only), EPCAM (Deletion/duplication testing only), FH, FLCN, GATA2, GPC3, GREM1 (Promoter region deletion/duplication testing only), HOXB13 (c.251G>A, p.Gly84Glu), HRAS, KIT, MAX, MEN1, MET, MITF (c.952G>A, p.Glu318Lys variant only), MLH1, MSH2, MSH3, MSH6, MUTYH, NBN, NF1, NF2, NTHL1, PALB2, PDGFRA, PHOX2B, PMS2, POLD1, POLE, POT1, PRKAR1A, PRSS1, PTCH1, PTEN, RAD50, RAD51C, RAD51D, RB1, RECQL4, RET, RNF43, RUNX1, SDHAF2, SDHA (sequence changes only), SDHB, SDHC, SDHD, SMAD4, SMARCA4, SMARCB1, SMARCE1, SPINK1, STK11, SUFU, TERC, TERT, TMEM127, TP53, TSC1, TSC2, VHL, WRN and WT1.    11/04/2020 Imaging   CT CAP  IMPRESSION: 1. Redemonstrated large, hypoenhancing mass of the central right lobe of the liver, which is stable or perhaps slightly enlarged compared to prior examination, measuring 11.5 x 7.0 cm, previously 10.9 x 7.1 cm when measured similarly. Small satellite lesions appear to have enlarged in the interval and there is new tumor involvement of the caudate, overall constellation of findings consistent with worsened cholangiocarcinoma. 2. There is segmental biliary ductal dilatation, primarily seen in the left lobe of the liver, not significantly changed compared to prior examination. The portal veins remain patent. 3. Interval enlargement of a nodule of the medial right lung base, consistent with worsened pulmonary metastatic disease. Additional small nodules are essentially stable and although technically nonspecific remain worrisome for additional small metastases. 4. Aortic atherosclerosis, advanced for patient age and gender. 5. Uterine fibroids.      CURRENT THERAPY:  First line gemcitabine and cisplatin 2 weeks on/1 week off starting 08/16/20 -Addition of Durvalumab q3weeks starting 08/16/20  INTERVAL HISTORY:  Crystal Gibbs is here for a follow up of intrahepatic cholangiocarcinoma. She was last seen by me  on 10/16/20. She was evaluated in the infusion area. Her husband an interpreter is present. She reports she feels bad following infusion and following the Neulasta injection. She reports body aches and headaches for about 4 days afterwards. She does not want to stop it entirely, as she is concerned about disease progression. Her husband notes overall improvement, despite her not feeling well for the days following treatment. She agrees, and notes she can feel a bit of a difference when she palpates her side.  All other systems were reviewed with the patient and are negative.  MEDICAL HISTORY:  Past Medical History:  Diagnosis Date   Cancer [facility])    Family history of breast cancer 08/23/2020   Family history of pancreatic cancer 08/23/2020   Family history of prostate cancer 08/23/2020   Family history of stomach cancer 08/23/2020   Fibroid    History of multiple miscarriages    x 3   Hypertension     SURGICAL HISTORY: Past Surgical History:  Procedure Laterality Date   BIOPSY  08/18/2020   Procedure: BIOPSY;  Surgeon: Lavena Bullion, DO;  Location: [facility];  Service: Gastroenterology;;   CATARACT EXTRACTION Bilateral    ESOPHAGOGASTRODUODENOSCOPY (EGD) WITH PROPOFOL N/A 08/18/2020   Procedure: ESOPHAGOGASTRODUODENOSCOPY (EGD) WITH PROPOFOL;  Surgeon: Lavena Bullion, DO;  Location: [facility];  Service: Gastroenterology;  Laterality: N/A;   IR IMAGING GUIDED PORT INSERTION  08/13/2020   PARATHYROIDECTOMY Right 02/19/15    I have reviewed the social history and family history with the patient and they are unchanged from previous note.  ALLERGIES:  has No Known Allergies.  MEDICATIONS:  Current Outpatient Medications  Medication Sig Dispense Refill   amLODipine (NORVASC) 2.5 MG tablet Take 1 tablet (2.5 mg total) by mouth daily. 90 tablet 1   dexamethasone (DECADRON) 4 MG tablet Take 2 tablets at breakfast for 3 days, starting the day after cisplatin 40 tablet 2   diclofenac  Sodium (VOLTAREN) 1 % GEL Apply 4 g topically 4 (four) times daily. 50 g 2   ibuprofen (ADVIL) 200 MG tablet Take 400 mg by mouth every 6 (six) hours as needed for headache.     lidocaine-prilocaine (EMLA) cream Apply to affected area once (Patient taking differently: Apply 1 application topically daily as needed (port access).) 30 g 3   magnesium oxide (MAG-OX) 400 (240 Mg) MG tablet Take 1 tablet (400 mg total) by mouth daily. 30 tablet 3   ondansetron (ZOFRAN ODT) 4 MG disintegrating tablet Take 1-2 tablets (4-8 mg total) by mouth every 6 (six) hours as needed for nausea or vomiting. 30 tablet 1   ondansetron (ZOFRAN) 8 MG tablet Take 1 tablet (8 mg total) by mouth 2 (two) times daily as needed. Start on the third day after cisplatin chemotherapy. 30 tablet 1   pantoprazole (PROTONIX) 40 MG tablet Take 1 tablet (40 mg total) by mouth daily. 30 tablet 1   polyethylene glycol powder (GLYCOLAX/MIRALAX) 17 GM/SCOOP powder Take 17 g by mouth 2 (two) times daily as needed. 3350 g 1   potassium chloride SA (KLOR-CON) 20 MEQ tablet Take 1 tablet (20 mEq total) by mouth daily. (Patient not taking: Reported on 09/21/2020) 30 tablet 0   prochlorperazine (COMPAZINE) 10 MG tablet Take 1 tablet (10 mg total) by mouth every 6 (six) hours as needed (Nausea or vomiting). 30 tablet 1   No current facility-administered medications for this visit.   Facility-Administered Medications Ordered in Other Visits  Medication Dose Route Frequency Provider  Last Rate Last Admin   0.9 %  sodium chloride infusion   Intravenous Once [name], MD       heparin lock flush 100 unit/mL  500 Units Intracatheter Once PRN [name], MD       sodium chloride flush (NS) 0.9 % injection 10 mL  10 mL Intracatheter PRN [name], MD        PHYSICAL EXAMINATION: ECOG PERFORMANCE STATUS: 2 - Symptomatic, <50% confined to bed Blood pressure 135/85, heart rate 104, respirate 18, temperature 36.8, pulse ox 100% on room air GENERAL:alert,  no distress and comfortable SKIN: skin color, texture, turgor are normal, no rashes or significant lesions EYES: normal, Conjunctiva are pink and non-injected, sclera clear  Musculoskeletal:no cyanosis of digits and no clubbing  NEURO: alert & oriented x 3 with fluent speech, no focal motor/sensory deficits  LABORATORY DATA:  I have reviewed the data as listed CBC Latest Ref Rng & Units 11/04/2020 10/22/2020 10/15/2020  WBC 4.0 - 10.5 K/uL 10.2 5.3 9.8  Hemoglobin 12.0 - 15.0 g/dL 10.2(L) 9.8(L) 10.9(L)  Hematocrit 36.0 - 46.0 % 31.0(L) 29.4(L) 33.4(L)  Platelets 150 - 400 K/uL 240 181 341     CMP Latest Ref Rng & Units 11/04/2020 10/22/2020 10/15/2020  Glucose 70 - 99 mg/dL 94 197(H) 160(H)  BUN 6 - 20 mg/dL 9 12 11   Creatinine 0.44 - 1.00 mg/dL 0.67 0.66 0.65  Sodium 135 - 145 mmol/L 139 137 140  Potassium 3.5 - 5.1 mmol/L 4.1 4.0 4.0  Chloride 98 - 111 mmol/L 102 102 103  CO2 22 - 32 mmol/L 28 28 26   Calcium 8.9 - 10.3 mg/dL 9.8 9.0 9.4  Total Protein 6.5 - 8.1 g/dL 7.8 7.2 7.4  Total Bilirubin 0.3 - 1.2 mg/dL 0.3 0.3 0.4  Alkaline Phos 38 - 126 U/L 274(H) 246(H) 244(H)  AST 15 - 41 U/L 23 28 25   ALT 0 - 44 U/L 10 21 15       RADIOGRAPHIC STUDIES: I have personally reviewed the radiological images as listed and agreed with the findings in the report. CT CHEST ABDOMEN PELVIS W CONTRAST  Result Date: 11/05/2020 CLINICAL DATA:  Intrahepatic cholangiocarcinoma restaging EXAM: CT CHEST, ABDOMEN, AND PELVIS WITH CONTRAST TECHNIQUE: Multidetector CT imaging of the chest, abdomen and pelvis was performed following the standard protocol during bolus administration of intravenous contrast. CONTRAST:  30m OMNIPAQUE IOHEXOL 350 MG/ML SOLN, additional oral enteric contrast COMPARISON:  MR abdomen, 07/10/2020, CT abdomen pelvis, 07/10/2020, CT chest, 07/29/2018 FINDINGS: CT CHEST FINDINGS Cardiovascular: Right chest port catheter. Scattered aortic atherosclerosis. Normal heart size. No pericardial  effusion. Mediastinum/Nodes: No enlarged mediastinal, hilar, or axillary lymph nodes. Thyroid gland, trachea, and esophagus demonstrate no significant findings. Lungs/Pleura: Interval enlargement of a nodule of the medial right lung base, measuring 8 mm, previously 4 mm (series 12, image 97) unchanged small nodule of the peripheral superior segment right lower lobe measuring 5 mm (series 12, image 31). Unchanged small nodule of the peripheral right upper lobe measuring 4 mm (series 12, image 35). Stable or slightly diminished nodule of the left lung base measuring 5 mm, previously 6 mm (series 12, image 107). No pleural effusion or pneumothorax. Musculoskeletal: No chest wall mass or suspicious bone lesions identified. CT ABDOMEN PELVIS FINDINGS Hepatobiliary: Hepatomegaly, maximum coronal span 23.1 cm. Redemonstrated large, hypoenhancing mass of the central right lobe of the liver, which is stable or perhaps slightly enlarged compared to prior examination, measuring 11.5 x 7.0 cm, previously 10.9 x 7.1  cm when measured similarly (series 11, image 73). Small satellite lesions appear to have enlarged in the interval, for example in the medial dome of the liver a lesion measuring 1.5 cm (series 11, image 60). There additionally peers to be new tumor involvement of the caudate (series 11, image 91). There is segmental biliary ductal dilatation, primarily seen in the left lobe of the liver, not significantly changed compared to prior examination (series 11, image 84). No gallstones, gallbladder wall thickening, or biliary dilatation. Pancreas: Unremarkable. No pancreatic ductal dilatation or surrounding inflammatory changes. Spleen: Normal in size without significant abnormality. Adrenals/Urinary Tract: Adrenal glands are unremarkable. Kidneys are normal, without renal calculi, solid lesion, or hydronephrosis. Bladder is unremarkable. Stomach/Bowel: Stomach is within normal limits. Appendix appears normal. No evidence  of bowel wall thickening, distention, or inflammatory changes. Vascular/Lymphatic: Aortic atherosclerosis. No enlarged abdominal or pelvic lymph nodes. Reproductive: Uterine fibroids. Other: No abdominal wall hernia or abnormality. No abdominopelvic ascites. Musculoskeletal: No acute or significant osseous findings. IMPRESSION: 1. Redemonstrated large, hypoenhancing mass of the central right lobe of the liver, which is stable or perhaps slightly enlarged compared to prior examination, measuring 11.5 x 7.0 cm, previously 10.9 x 7.1 cm when measured similarly. Small satellite lesions appear to have enlarged in the interval and there is new tumor involvement of the caudate, overall constellation of findings consistent with worsened cholangiocarcinoma. 2. There is segmental biliary ductal dilatation, primarily seen in the left lobe of the liver, not significantly changed compared to prior examination. The portal veins remain patent. 3. Interval enlargement of a nodule of the medial right lung base, consistent with worsened pulmonary metastatic disease. Additional small nodules are essentially stable and although technically nonspecific remain worrisome for additional small metastases. 4. Aortic atherosclerosis, advanced for patient age and gender. 5. Uterine fibroids. Aortic Atherosclerosis (ICD10-I70.0). Electronically Signed   By: Eddie Candle M.D.   On: 11/05/2020 09:51     ASSESSMENT & PLAN:  Malu Pellegrini is a 45 y.o. female with   1. 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 -Her Liver biopsy from 07/26/20 showed adenocarcinoma. I discussed this type of cancer could be from liver primary or from bile duct primary called Cholangiocarcinoma. There is also possibility her primary  cancer is elsewhere, although imaging did not show other malignancy outside of liver. I dicussed scans have limitations. -Her 07/10/20 MRI abdomen also shows there is at least 1 satellite lesion centrally in the right lobe and her 07/30/20 CT Chest shows Pulmonary nodules are highly worrisome for metastatic disease.  -Her Upper endoscopy by Dr [name] on 08/18/20 did not show malignancy.  -I started her on chemo with First line gemcitabine and cisplatin 2 weeks on/1 week off in addition to Durvalumab q3weeks based on recent TOPAZ1 trial data. She started on 08/16/20. The goal of chemotherapy is palliative. -Her 11/04/20 CT CAP showed overall stable disease with mild increase of of her primary tumor and satellite lesion, but still in the range of stable disease.  Her right pulmonary nodule also slightly increased from 4 mm to 8 mm.  I discussed with patient in detail.  She is clinically doing better, I will continue current treatment for now. -I will present her case to our tumor conference next week. If this is  felt to be true progression, I will change her treatment sooner. I will call her with our recommendation. For now, we will continue her current treatment for 2 more cycles with repeat scan with abdominal MRI for better quality of image.   2. Abdominal Pain, Hepatomegaly, secondary to #1 -She has had abdominal pain 2 months before diagnosis. The pain is not daily (mostly with eating and prolonged sitting) but discomfort is mostly daily. -Pain started while she was in [place] and Work up was concerning for malignancy. She chose to return to the [place] for more workup.  -Pain has much improved since she started chemo, continue monitoring and tramadol as needed. She has only needed occasionally. She also uses Tylenol. -She has hepatomegaly since 09/20/20 exam. Also demonstrated on 11/04/20 CT.  3. Right shoulder and arm pain -She notes right shoulder pain that radiates down arm. She notes this is not constant  and can fluctuate in severity, between 5-8/10. -She has Tramadol for her pain, she can continue or Tylenol/Ibuprofen.  -CT scan was negative for chest wall mass or bone lesions, her pain could be muscular pain in nature   4. Fatigue, Low Appetite  -Secondary to underlying malignancy and chemotherapy -She reports good response to dexamethasone, which unfortunately would be contraindicated due to his immunotherapy. I recommend her not to use it after chemo.  She agrees. -I encouraged her to eat well and stay active physically -I also encouraged her to eat adequately with high protein, high calorie diet and use nutritional supplement.  -Given her RUQ pain and Acid Reflux, she is still eating less. So far able to maintain weight. I prescribed Protonix for her Acid Reflux on 09/27/20   5. Social and Acupuncturist -She lives in [place] with her husband, although she was recently frequently in [place]. She is a [place] Citizen. -She and her husband does not speak much [place]. They require interpretor, one of who they listed as a point of contact.   -She is not working. He has job, but not currently working, as to help his wife. -She has [employer] health insurance. She does not have a PCP currently in the [place]. -Continue f/u with SW and financial advocate for support and to possibly apply for Medicare/Medicaid.   6. Genetic testing -Given her age and family history of GI cancer with her mother, she is eligible for genetic testing.  -Results negative with VUS in SDHA and RECQL4.   7. Indeterminate lung nodules -initially seen on chest CT 07/30/20 most are 60m or less  -increased size of medial right lung base nodule on 11/04/20 scan, other nodules stable.     PLAN:  -C4 Cis/gem today and next week, durvalumab today  -GI conference discussion next week -labs and f/u in 3 weeks    No problem-specific Assessment & Plan notes found for this encounter.   No orders of the defined types were placed in  this encounter.   All questions were answered. The patient knows to call the clinic with any problems, questions or concerns. No barriers to learning was detected. The total time spent in the appointment was 30 minutes.     YTruitt Merle MD 10/15/2020  I, KWilburn Mylar am acting as scribe for YTruitt Merle MD.   I have reviewed the above documentation for accuracy and completeness, and I agree with the above.

## 2020-11-05 ENCOUNTER — Inpatient Hospital Stay: Payer: 59

## 2020-11-05 ENCOUNTER — Encounter: Payer: Self-pay | Admitting: Hematology

## 2020-11-05 ENCOUNTER — Inpatient Hospital Stay (HOSPITAL_BASED_OUTPATIENT_CLINIC_OR_DEPARTMENT_OTHER): Payer: 59 | Admitting: Hematology

## 2020-11-05 ENCOUNTER — Telehealth: Payer: Self-pay

## 2020-11-05 VITALS — BP 135/85 | HR 104 | Temp 98.2°F | Resp 18 | Wt 146.0 lb

## 2020-11-05 DIAGNOSIS — C221 Intrahepatic bile duct carcinoma: Secondary | ICD-10-CM

## 2020-11-05 DIAGNOSIS — Z5111 Encounter for antineoplastic chemotherapy: Secondary | ICD-10-CM | POA: Diagnosis not present

## 2020-11-05 LAB — CANCER ANTIGEN 19-9: CA 19-9: 52 U/mL — ABNORMAL HIGH (ref 0–35)

## 2020-11-05 LAB — T4: T4, Total: 9.6 ug/dL (ref 4.5–12.0)

## 2020-11-05 MED ORDER — SODIUM CHLORIDE 0.9 % IV SOLN
Freq: Once | INTRAVENOUS | Status: DC
  Filled 2020-11-05: qty 250

## 2020-11-05 MED ORDER — SODIUM CHLORIDE 0.9 % IV SOLN
1000.0000 mg/m2 | Freq: Once | INTRAVENOUS | Status: AC
  Administered 2020-11-05: 1672 mg via INTRAVENOUS
  Filled 2020-11-05: qty 43.97

## 2020-11-05 MED ORDER — SODIUM CHLORIDE 0.9 % IV SOLN
1500.0000 mg | Freq: Once | INTRAVENOUS | Status: AC
  Administered 2020-11-05: 1500 mg via INTRAVENOUS
  Filled 2020-11-05: qty 30

## 2020-11-05 MED ORDER — SODIUM CHLORIDE 0.9 % IV SOLN
150.0000 mg | Freq: Once | INTRAVENOUS | Status: AC
  Administered 2020-11-05: 150 mg via INTRAVENOUS
  Filled 2020-11-05: qty 150

## 2020-11-05 MED ORDER — HEPARIN SOD (PORK) LOCK FLUSH 100 UNIT/ML IV SOLN
500.0000 [IU] | Freq: Once | INTRAVENOUS | Status: AC | PRN
  Administered 2020-11-05: 500 [IU]
  Filled 2020-11-05: qty 5

## 2020-11-05 MED ORDER — CISPLATIN CHEMO INJECTION 100MG/100ML
25.0000 mg/m2 | Freq: Once | INTRAVENOUS | Status: AC
  Administered 2020-11-05: 42 mg via INTRAVENOUS
  Filled 2020-11-05: qty 42

## 2020-11-05 MED ORDER — MAGNESIUM SULFATE 2 GM/50ML IV SOLN
INTRAVENOUS | Status: AC
  Filled 2020-11-05: qty 50

## 2020-11-05 MED ORDER — PALONOSETRON HCL INJECTION 0.25 MG/5ML
INTRAVENOUS | Status: AC
  Filled 2020-11-05: qty 5

## 2020-11-05 MED ORDER — PALONOSETRON HCL INJECTION 0.25 MG/5ML
0.2500 mg | Freq: Once | INTRAVENOUS | Status: AC
  Administered 2020-11-05: 0.25 mg via INTRAVENOUS

## 2020-11-05 MED ORDER — ACETAMINOPHEN 325 MG PO TABS
ORAL_TABLET | ORAL | Status: AC
  Filled 2020-11-05: qty 2

## 2020-11-05 MED ORDER — POTASSIUM CHLORIDE IN NACL 20-0.9 MEQ/L-% IV SOLN
Freq: Once | INTRAVENOUS | Status: AC
Start: 2020-11-05 — End: 2020-11-05
  Filled 2020-11-05: qty 1000

## 2020-11-05 MED ORDER — SODIUM CHLORIDE 0.9 % IV SOLN
Freq: Once | INTRAVENOUS | Status: AC
  Filled 2020-11-05: qty 250

## 2020-11-05 MED ORDER — MAGNESIUM SULFATE 2 GM/50ML IV SOLN
2.0000 g | Freq: Once | INTRAVENOUS | Status: AC
  Administered 2020-11-05: 2 g via INTRAVENOUS

## 2020-11-05 MED ORDER — SODIUM CHLORIDE 0.9% FLUSH
10.0000 mL | INTRAVENOUS | Status: DC | PRN
  Administered 2020-11-05: 10 mL
  Filled 2020-11-05: qty 10

## 2020-11-05 MED ORDER — ACETAMINOPHEN 325 MG PO TABS
650.0000 mg | ORAL_TABLET | Freq: Once | ORAL | Status: AC
  Administered 2020-11-05: 650 mg via ORAL

## 2020-11-05 NOTE — Telephone Encounter (Signed)
[facility] Radiology to request STAT readout on pt's CT. They state they will escalate this before noon.

## 2020-11-05 NOTE — Progress Notes (Signed)
HR elevated MD ok to treat

## 2020-11-05 NOTE — Patient Instructions (Signed)
[facility]  Discharge Instructions: Thank you for choosing [facility] to provide your oncology and hematology care.   If you have a lab appointment with the [facility], please go directly to the [facility] and check in at the registration area.   Wear comfortable clothing and clothing appropriate for easy access to any Portacath or PICC line.   We strive to give you quality time with your provider. You may need to reschedule your appointment if you arrive late (15 or more minutes).  Arriving late affects you and other patients whose appointments are after yours.  Also, if you miss three or more appointments without notifying the office, you may be dismissed from the clinic at the provider's discretion.      For prescription refill requests, have your pharmacy contact our office and allow 72 hours for refills to be completed.    Today you received the following chemotherapy and/or immunotherapy agents Cisplatin, Gemcitabine, and Durvalumab      To help prevent nausea and vomiting after your treatment, we encourage you to take your nausea medication as directed.  BELOW ARE SYMPTOMS THAT SHOULD BE REPORTED IMMEDIATELY: *FEVER GREATER THAN 100.4 F (38 C) OR HIGHER *CHILLS OR SWEATING *NAUSEA AND VOMITING THAT IS NOT CONTROLLED WITH YOUR NAUSEA MEDICATION *UNUSUAL SHORTNESS OF BREATH *UNUSUAL BRUISING OR BLEEDING *URINARY PROBLEMS (pain or burning when urinating, or frequent urination) *BOWEL PROBLEMS (unusual diarrhea, constipation, pain near the anus) TENDERNESS IN MOUTH AND THROAT WITH OR WITHOUT PRESENCE OF ULCERS (sore throat, sores in mouth, or a toothache) UNUSUAL RASH, SWELLING OR PAIN  UNUSUAL VAGINAL DISCHARGE OR ITCHING   Items with * indicate a potential emergency and should be followed up as soon as possible or go to the Emergency Department if any problems should occur.  Please show the CHEMOTHERAPY ALERT CARD or  IMMUNOTHERAPY ALERT CARD at check-in to the Emergency Department and triage nurse.  Should you have questions after your visit or need to cancel or reschedule your appointment, please contact [facility]  Dept: [phone]  and follow the prompts.  Office hours are 8:00 a.m. to 4:30 p.m. Monday - Friday. Please note that voicemails left after 4:00 p.m. may not be returned until the following business day.  We are closed weekends and major holidays. You have access to a nurse at all times for urgent questions. Please call the main number to the clinic Dept: [phone] and follow the prompts.   For any non-urgent questions, you may also contact your provider using MyChart. We now offer e-Visits for anyone 76 and older to request care online for non-urgent symptoms. For details visit [email or web address].   Also download the MyChart app! Go to the app store, search "MyChart", open the app, select [facility], and log in with your MyChart username and password.  Due to Covid, a mask is required upon entering the hospital/clinic. If you do not have a mask, one will be given to you upon arrival. For doctor visits, patients may have 1 support person aged 56 or older with them. For treatment visits, patients cannot have anyone with them due to current Covid guidelines and our immunocompromised population.

## 2020-11-05 NOTE — Progress Notes (Signed)
Gave pt gas card this visit.  Reminded pt to ask for gas card at check-in to insure she gets her gas card.  (Interpreter translated instructions to pt).  Pt verbalized understanding of instructions.

## 2020-11-08 ENCOUNTER — Telehealth: Payer: Self-pay | Admitting: Hematology

## 2020-11-08 NOTE — Telephone Encounter (Signed)
Left message with follow-up appointments per 7/29 los. 

## 2020-11-10 ENCOUNTER — Other Ambulatory Visit: Payer: Self-pay | Admitting: *Deleted

## 2020-11-10 NOTE — Progress Notes (Signed)
The proposed treatment discussed in conference is for discussion purpose only and is not a binding recommendation.  The patients have not been physically examined, or presented with their treatment options.  Therefore, final treatment plans cannot be decided.  

## 2020-11-11 ENCOUNTER — Inpatient Hospital Stay: Payer: 59 | Attending: Hematology

## 2020-11-11 ENCOUNTER — Other Ambulatory Visit: Payer: Self-pay

## 2020-11-11 DIAGNOSIS — K219 Gastro-esophageal reflux disease without esophagitis: Secondary | ICD-10-CM | POA: Insufficient documentation

## 2020-11-11 DIAGNOSIS — Z79899 Other long term (current) drug therapy: Secondary | ICD-10-CM | POA: Insufficient documentation

## 2020-11-11 DIAGNOSIS — Z5111 Encounter for antineoplastic chemotherapy: Secondary | ICD-10-CM | POA: Diagnosis present

## 2020-11-11 DIAGNOSIS — M25511 Pain in right shoulder: Secondary | ICD-10-CM | POA: Insufficient documentation

## 2020-11-11 DIAGNOSIS — C221 Intrahepatic bile duct carcinoma: Secondary | ICD-10-CM

## 2020-11-11 DIAGNOSIS — G893 Neoplasm related pain (acute) (chronic): Secondary | ICD-10-CM | POA: Diagnosis not present

## 2020-11-11 DIAGNOSIS — I10 Essential (primary) hypertension: Secondary | ICD-10-CM | POA: Insufficient documentation

## 2020-11-11 DIAGNOSIS — Z95828 Presence of other vascular implants and grafts: Secondary | ICD-10-CM

## 2020-11-11 DIAGNOSIS — R5383 Other fatigue: Secondary | ICD-10-CM | POA: Diagnosis not present

## 2020-11-11 DIAGNOSIS — R630 Anorexia: Secondary | ICD-10-CM | POA: Insufficient documentation

## 2020-11-11 LAB — CBC WITH DIFFERENTIAL (CANCER CENTER ONLY)
Abs Immature Granulocytes: 0.01 10*3/uL (ref 0.00–0.07)
Basophils Absolute: 0 10*3/uL (ref 0.0–0.1)
Basophils Relative: 1 %
Eosinophils Absolute: 0.1 10*3/uL (ref 0.0–0.5)
Eosinophils Relative: 1 %
HCT: 28.2 % — ABNORMAL LOW (ref 36.0–46.0)
Hemoglobin: 9.4 g/dL — ABNORMAL LOW (ref 12.0–15.0)
Immature Granulocytes: 0 %
Lymphocytes Relative: 35 %
Lymphs Abs: 1.5 10*3/uL (ref 0.7–4.0)
MCH: 30.9 pg (ref 26.0–34.0)
MCHC: 33.3 g/dL (ref 30.0–36.0)
MCV: 92.8 fL (ref 80.0–100.0)
Monocytes Absolute: 0.3 10*3/uL (ref 0.1–1.0)
Monocytes Relative: 7 %
Neutro Abs: 2.3 10*3/uL (ref 1.7–7.7)
Neutrophils Relative %: 56 %
Platelet Count: 293 10*3/uL (ref 150–400)
RBC: 3.04 MIL/uL — ABNORMAL LOW (ref 3.87–5.11)
RDW: 16.8 % — ABNORMAL HIGH (ref 11.5–15.5)
WBC Count: 4.2 10*3/uL (ref 4.0–10.5)
nRBC: 0 % (ref 0.0–0.2)

## 2020-11-11 LAB — CMP (CANCER CENTER ONLY)
ALT: 20 U/L (ref 0–44)
AST: 27 U/L (ref 15–41)
Albumin: 3.4 g/dL — ABNORMAL LOW (ref 3.5–5.0)
Alkaline Phosphatase: 214 U/L — ABNORMAL HIGH (ref 38–126)
Anion gap: 9 (ref 5–15)
BUN: 9 mg/dL (ref 6–20)
CO2: 27 mmol/L (ref 22–32)
Calcium: 9.4 mg/dL (ref 8.9–10.3)
Chloride: 102 mmol/L (ref 98–111)
Creatinine: 0.63 mg/dL (ref 0.44–1.00)
GFR, Estimated: >60 mL/min (ref >60)
Glucose, Bld: 133 mg/dL — ABNORMAL HIGH (ref 70–99)
Potassium: 4.2 mmol/L (ref 3.5–5.1)
Sodium: 138 mmol/L (ref 135–145)
Total Bilirubin: 0.3 mg/dL (ref 0.3–1.2)
Total Protein: 7.3 g/dL (ref 6.5–8.1)

## 2020-11-11 LAB — TSH: TSH: 2.082 u[IU]/mL (ref 0.308–3.960)

## 2020-11-11 LAB — PREGNANCY, URINE: Preg Test, Ur: NEGATIVE

## 2020-11-11 LAB — MAGNESIUM: Magnesium: 1.7 mg/dL (ref 1.7–2.4)

## 2020-11-11 MED ORDER — SODIUM CHLORIDE 0.9% FLUSH
10.0000 mL | Freq: Once | INTRAVENOUS | Status: AC
  Administered 2020-11-11: 10 mL
  Filled 2020-11-11: qty 10

## 2020-11-11 MED ORDER — HEPARIN SOD (PORK) LOCK FLUSH 100 UNIT/ML IV SOLN
500.0000 [IU] | Freq: Once | INTRAVENOUS | Status: AC
  Administered 2020-11-11: 500 [IU]
  Filled 2020-11-11: qty 5

## 2020-11-12 ENCOUNTER — Other Ambulatory Visit: Payer: Self-pay

## 2020-11-12 ENCOUNTER — Encounter: Payer: Self-pay | Admitting: Hematology

## 2020-11-12 ENCOUNTER — Inpatient Hospital Stay: Payer: 59

## 2020-11-12 VITALS — BP 136/80 | HR 99 | Temp 99.2°F | Resp 16

## 2020-11-12 DIAGNOSIS — C221 Intrahepatic bile duct carcinoma: Secondary | ICD-10-CM

## 2020-11-12 DIAGNOSIS — Z5111 Encounter for antineoplastic chemotherapy: Secondary | ICD-10-CM | POA: Diagnosis not present

## 2020-11-12 LAB — T4: T4, Total: 8 ug/dL (ref 4.5–12.0)

## 2020-11-12 LAB — CANCER ANTIGEN 19-9: CA 19-9: 50 U/mL — ABNORMAL HIGH (ref 0–35)

## 2020-11-12 MED ORDER — PALONOSETRON HCL INJECTION 0.25 MG/5ML
INTRAVENOUS | Status: AC
  Filled 2020-11-12: qty 5

## 2020-11-12 MED ORDER — SODIUM CHLORIDE 0.9 % IV SOLN
150.0000 mg | Freq: Once | INTRAVENOUS | Status: AC
  Administered 2020-11-12: 150 mg via INTRAVENOUS
  Filled 2020-11-12: qty 150

## 2020-11-12 MED ORDER — SODIUM CHLORIDE 0.9 % IV SOLN
1000.0000 mg/m2 | Freq: Once | INTRAVENOUS | Status: AC
  Administered 2020-11-12: 1672 mg via INTRAVENOUS
  Filled 2020-11-12: qty 43.97

## 2020-11-12 MED ORDER — SODIUM CHLORIDE 0.9 % IV SOLN
25.0000 mg/m2 | Freq: Once | INTRAVENOUS | Status: AC
  Administered 2020-11-12: 42 mg via INTRAVENOUS
  Filled 2020-11-12: qty 42

## 2020-11-12 MED ORDER — HEPARIN SOD (PORK) LOCK FLUSH 100 UNIT/ML IV SOLN
500.0000 [IU] | Freq: Once | INTRAVENOUS | Status: DC | PRN
  Filled 2020-11-12: qty 5

## 2020-11-12 MED ORDER — ACETAMINOPHEN 325 MG PO TABS
650.0000 mg | ORAL_TABLET | Freq: Once | ORAL | Status: AC
  Administered 2020-11-12: 650 mg via ORAL

## 2020-11-12 MED ORDER — SODIUM CHLORIDE 0.9 % IV SOLN
Freq: Once | INTRAVENOUS | Status: AC
  Filled 2020-11-12: qty 250

## 2020-11-12 MED ORDER — ACETAMINOPHEN 325 MG PO TABS
ORAL_TABLET | ORAL | Status: AC
  Filled 2020-11-12: qty 2

## 2020-11-12 MED ORDER — POTASSIUM CHLORIDE IN NACL 20-0.9 MEQ/L-% IV SOLN
Freq: Once | INTRAVENOUS | Status: AC
  Filled 2020-11-12: qty 1000

## 2020-11-12 MED ORDER — PALONOSETRON HCL INJECTION 0.25 MG/5ML
0.2500 mg | Freq: Once | INTRAVENOUS | Status: AC
  Administered 2020-11-12: 0.25 mg via INTRAVENOUS

## 2020-11-12 MED ORDER — MAGNESIUM SULFATE 2 GM/50ML IV SOLN
2.0000 g | Freq: Once | INTRAVENOUS | Status: AC
  Administered 2020-11-12: 2 g via INTRAVENOUS

## 2020-11-12 MED ORDER — SODIUM CHLORIDE 0.9% FLUSH
10.0000 mL | INTRAVENOUS | Status: DC | PRN
  Administered 2020-11-12: 10 mL
  Filled 2020-11-12: qty 10

## 2020-11-12 MED ORDER — MAGNESIUM SULFATE 2 GM/50ML IV SOLN
INTRAVENOUS | Status: AC
  Filled 2020-11-12: qty 50

## 2020-11-12 NOTE — Progress Notes (Signed)
Per [name], patient will not receive G-CSF with this cycle. Alferd Apa, LPN agreed to notify patient.

## 2020-11-12 NOTE — Patient Instructions (Signed)
[facility]COL[facility]   Discharge Instructions: Thank you for choosing [facility] to provide your oncology and hematology care.   If you have a lab appointment with the [facility], please go directly to the [facility] and check in at the registration area.   Wear comfortable clothing and clothing appropriate for easy access to any Portacath or PICC line.   We strive to give you quality time with your provider. You may need to reschedule your appointment if you arrive late (15 or more minutes).  Arriving late affects you and other patients whose appointments are after yours.  Also, if you miss three or more appointments without notifying the office, you may be dismissed from the clinic at the provider's discretion.      For prescription refill requests, have your pharmacy contact our office and allow 72 hours for refills to be completed.    Today you received the following chemotherapy and/or immunotherapy agents: gemcitabine and cisplatin.      To help prevent nausea and vomiting after your treatment, we encourage you to take your nausea medication as directed.  BELOW ARE SYMPTOMS THAT SHOULD BE REPORTED IMMEDIATELY: *FEVER GREATER THAN 100.4 F (38 C) OR HIGHER *CHILLS OR SWEATING *NAUSEA AND VOMITING THAT IS NOT CONTROLLED WITH YOUR NAUSEA MEDICATION *UNUSUAL SHORTNESS OF BREATH *UNUSUAL BRUISING OR BLEEDING *URINARY PROBLEMS (pain or burning when urinating, or frequent urination) *BOWEL PROBLEMS (unusual diarrhea, constipation, pain near the anus) TENDERNESS IN MOUTH AND THROAT WITH OR WITHOUT PRESENCE OF ULCERS (sore throat, sores in mouth, or a toothache) UNUSUAL RASH, SWELLING OR PAIN  UNUSUAL VAGINAL DISCHARGE OR ITCHING   Items with * indicate a potential emergency and should be followed up as soon as possible or go to the Emergency Department if any problems should occur.  Please show the CHEMOTHERAPY ALERT CARD or IMMUNOTHERAPY ALERT  CARD at check-in to the Emergency Department and triage nurse.  Should you have questions after your visit or need to cancel or reschedule your appointment, please contact [facility]  Dept: [phone]  and follow the prompts.  Office hours are 8:00 a.m. to 4:30 p.m. Monday - Friday. Please note that voicemails left after 4:00 p.m. may not be returned until the following business day.  We are closed weekends and major holidays. You have access to a nurse at all times for urgent questions. Please call the main number to the clinic Dept: [phone] and follow the prompts.   For any non-urgent questions, you may also contact your provider using MyChart. We now offer e-Visits for anyone 10 and older to request care online for non-urgent symptoms. For details visit [email or web address].   Also download the MyChart app! Go to the app store, search "MyChart", open the app, select [facility], and log in with your MyChart username and password.  Due to Covid, a mask is required upon entering the hospital/clinic. If you do not have a mask, one will be given to you upon arrival. For doctor visits, patients may have 1 support person aged 66 or older with them. For treatment visits, patients cannot have anyone with them due to current Covid guidelines and our immunocompromised population.

## 2020-11-25 ENCOUNTER — Encounter: Payer: Self-pay | Admitting: Hematology

## 2020-11-25 ENCOUNTER — Inpatient Hospital Stay (HOSPITAL_BASED_OUTPATIENT_CLINIC_OR_DEPARTMENT_OTHER): Payer: 59 | Admitting: Hematology

## 2020-11-25 ENCOUNTER — Inpatient Hospital Stay: Payer: 59

## 2020-11-25 ENCOUNTER — Other Ambulatory Visit: Payer: Self-pay

## 2020-11-25 VITALS — BP 140/97 | HR 124 | Temp 98.2°F | Wt 145.6 lb

## 2020-11-25 DIAGNOSIS — C221 Intrahepatic bile duct carcinoma: Secondary | ICD-10-CM

## 2020-11-25 DIAGNOSIS — Z95828 Presence of other vascular implants and grafts: Secondary | ICD-10-CM

## 2020-11-25 DIAGNOSIS — Z5111 Encounter for antineoplastic chemotherapy: Secondary | ICD-10-CM | POA: Diagnosis not present

## 2020-11-25 LAB — CMP (CANCER CENTER ONLY)
ALT: 13 U/L (ref 0–44)
AST: 24 U/L (ref 15–41)
Albumin: 3.7 g/dL (ref 3.5–5.0)
Alkaline Phosphatase: 198 U/L — ABNORMAL HIGH (ref 38–126)
Anion gap: 11 (ref 5–15)
BUN: 16 mg/dL (ref 6–20)
CO2: 25 mmol/L (ref 22–32)
Calcium: 9.5 mg/dL (ref 8.9–10.3)
Chloride: 101 mmol/L (ref 98–111)
Creatinine: 0.73 mg/dL (ref 0.44–1.00)
GFR, Estimated: >60 mL/min (ref >60)
Glucose, Bld: 207 mg/dL — ABNORMAL HIGH (ref 70–99)
Potassium: 3.9 mmol/L (ref 3.5–5.1)
Sodium: 137 mmol/L (ref 135–145)
Total Bilirubin: 0.3 mg/dL (ref 0.3–1.2)
Total Protein: 7.5 g/dL (ref 6.5–8.1)

## 2020-11-25 LAB — CBC WITH DIFFERENTIAL (CANCER CENTER ONLY)
Abs Immature Granulocytes: 0.02 10*3/uL (ref 0.00–0.07)
Basophils Absolute: 0 10*3/uL (ref 0.0–0.1)
Basophils Relative: 0 %
Eosinophils Absolute: 0.1 10*3/uL (ref 0.0–0.5)
Eosinophils Relative: 1 %
HCT: 30.3 % — ABNORMAL LOW (ref 36.0–46.0)
Hemoglobin: 9.9 g/dL — ABNORMAL LOW (ref 12.0–15.0)
Immature Granulocytes: 0 %
Lymphocytes Relative: 24 %
Lymphs Abs: 1.5 10*3/uL (ref 0.7–4.0)
MCH: 30.7 pg (ref 26.0–34.0)
MCHC: 32.7 g/dL (ref 30.0–36.0)
MCV: 94.1 fL (ref 80.0–100.0)
Monocytes Absolute: 0.6 10*3/uL (ref 0.1–1.0)
Monocytes Relative: 10 %
Neutro Abs: 3.9 10*3/uL (ref 1.7–7.7)
Neutrophils Relative %: 65 %
Platelet Count: 191 10*3/uL (ref 150–400)
RBC: 3.22 MIL/uL — ABNORMAL LOW (ref 3.87–5.11)
RDW: 16.8 % — ABNORMAL HIGH (ref 11.5–15.5)
WBC Count: 6.1 10*3/uL (ref 4.0–10.5)
nRBC: 0 % (ref 0.0–0.2)

## 2020-11-25 LAB — MAGNESIUM: Magnesium: 1.5 mg/dL — ABNORMAL LOW (ref 1.7–2.4)

## 2020-11-25 LAB — PREGNANCY, URINE: Preg Test, Ur: NEGATIVE

## 2020-11-25 LAB — TSH: TSH: 1.144 u[IU]/mL (ref 0.308–3.960)

## 2020-11-25 MED ORDER — SODIUM CHLORIDE 0.9% FLUSH
10.0000 mL | Freq: Once | INTRAVENOUS | Status: AC
  Administered 2020-11-25: 10 mL

## 2020-11-25 MED ORDER — HEPARIN SOD (PORK) LOCK FLUSH 100 UNIT/ML IV SOLN
500.0000 [IU] | Freq: Once | INTRAVENOUS | Status: AC
  Administered 2020-11-25: 500 [IU]

## 2020-11-25 MED ORDER — LIDOCAINE-PRILOCAINE 2.5-2.5 % EX CREA: 1.0000 "application " | TOPICAL_CREAM | Freq: Every day | CUTANEOUS | 2 refills | Status: DC | PRN

## 2020-11-25 MED FILL — Fosaprepitant Dimeglumine For IV Infusion 150 MG (Base Eq): INTRAVENOUS | Qty: 5 | Status: AC

## 2020-11-25 NOTE — Progress Notes (Signed)
[place]   [phone] [phone]   Clinic Follow up Note   Patient Care Team: [name], MD as PCP - General (Family Medicine) [name], MD as Consulting Physician (Oncology) [name], RN (Inactive) as Oncology Nurse Navigator  Date of Service:  11/25/2020  CHIEF COMPLAINT: f/u of intrahepatic cholangiocarcinoma  SUMMARY OF ONCOLOGIC HISTORY: Oncology History Overview Note  Cancer Staging Intrahepatic cholangiocarcinoma ([place]) Staging form: Intrahepatic Bile Duct, AJCC 8th Edition - Clinical: Stage IB (cT1b, cN0, cM0) - Signed by [name], MD on 08/04/2020    Intrahepatic cholangiocarcinoma ([place])  08/19/2019 Procedure   Upper Endoscopy by Dr [name]  IMPRESSION - Normal esophagus. - Z-line regular, 35 cm from the incisors. - Gastritis. Biopsied. - Normal incisura, antrum and pylorus. - Erythematous duodenopathy. Biopsied. - Normal second portion of the duodenum and third portion of the duodenum. - No evidence of primary malignancy site noted on this study.   07/10/2020 Imaging   US Abdomen 07/10/20 IMPRESSION: 1.  No acute hepatobiliary findings.   2. Heterogeneous liver echotexture with 3 masslike areas as described measuring 4.3 cm, 5.4 cm and 6.4 cm respectively. Recommend CT abdomen/pelvis with intravenous contrast for further evaluation.   07/10/2020 Imaging   CT AP 07/10/20  IMPRESSION: 1. There is a large masslike collection or mass in the central liver measuring at least 10.9 x 7.1 x 10.2 cm with 2 smaller adjacent satellite lesions/collections measuring 11 and 17 mm. These findings may represent a large abscess with satellite abscesses or a large neoplasm/malignancy. Recommend MRI for further evaluation. 2. Left renal cyst. 3. Calcified atherosclerosis in the distal abdominal aorta is mild. 4. Fibroid uterus.   07/10/2020 Imaging   MRI Abdomen 07/10/20  IMPRESSION: 1. The large central hepatic mass  does not demonstrate signal characteristics or enhancement typical of a hemangioma or abscess, and is suspicious for malignancy. There is at least 1 satellite lesion centrally in the right lobe. As there are no signs of underlying cirrhosis, primary considerations include peripheral cholangiocarcinoma and metastatic disease. Tissue sampling recommended. 2. Mild intrahepatic biliary dilatation within the left hepatic lobe. Distortion of the hepatic vasculature without evidence of tumor thrombus. 3. No extrahepatic metastatic disease or primary malignancy identified within the abdomen.   07/26/2020 Imaging   CT AP 07/26/20  IMPRESSION: 1. Markedly poorly visualized and ill-defined 12 cm lesion within the liver. This lesion is better evaluated on MR abdomen 07/10/2020. 2. Otherwise no acute intra-abdominal abnormality with markedly limited evaluation on this noncontrast study. 3.  Aortic Atherosclerosis (ICD10-I70.0).   07/26/2020 Initial Diagnosis   FINAL MICROSCOPIC DIAGNOSIS: 07/26/20  A. LIVER MASS, LEFT, NEEDLE CORE BIOPSY:  - Adenocarcinoma.   ADDENDUM: The adenocarcinoma is positive with immunohistochemistry for cytokeratin 7 and PAX 8.  The carcinoma is negative with cytokeratin 20, CDX 2, estrogen receptor, GATA3, GCDFP, monoclonal and polyclonal CEA, cytokeratin 5/6, TTF-1, Napsin A, HepPar1, arginase 1 and WT1.  The morphology and immunophenotype are consistent with adenocarcinoma.  The differential for possible primary sites is broad including kidney although the morphology is not typical for more common types of renal cell carcinoma.  Pancreaticobiliary and gynecologic cannot be ruled out.    07/30/2020 Imaging   CT Chest 07/30/20  IMPRESSION: 1. Pulmonary nodules are highly worrisome for metastatic disease. 2. Hepatic masses are consistent with biopsy-proven adenocarcinoma and better evaluated on MR abdomen 07/10/2020.   08/04/2020 Initial Diagnosis   Intrahepatic  cholangiocarcinoma ([place])   08/04/2020 Cancer Staging  Staging form: Intrahepatic Bile Duct, AJCC 8th Edition - Clinical: Stage IB (cT1b, cN0, cM0) - Signed by [name], MD on 08/04/2020   08/13/2020 Procedure   PAC placement   08/16/2020 -  Chemotherapy   First line gemcitabine and cisplatin 2 weeks on/1 week off starting 08/16/20     08/16/2020 -  Chemotherapy   -Addition of durvalumab q3weeks with first-line chemo starting 08/16/20    08/18/2020 Procedure   Upper Endoscopy by Dr [name] IMPRESSION - Normal esophagus. - Z-line regular, 35 cm from the incisors. - Gastritis. Biopsied. - Normal incisura, antrum and pylorus. - Erythematous duodenopathy. Biopsied. - Normal second portion of the duodenum and third portion of the duodenum. - No evidence of primary malignancy site noted on this study.   08/18/2020 Pathology Results   FINAL MICROSCOPIC DIAGNOSIS:   A. DUODENUM, BULB, BIOPSY:  -  Erosive and reactive duodenal mucosa with Brunner's gland hyperplasia and mixed inflammation  -  No dysplasia or malignancy identified   B. STOMACH, BIOPSY:  -  Chronic active gastritis  -  H. pylori organisms present  -  No intestinal metaplasia identified  -  See comment   COMMENT:  Warthin-Starry stain is POSITIVE for organisms consistent with  Helicobacter pylori.    09/08/2020 Genetic Testing   Negative hereditary cancer genetic testing: no pathogenic variants detected in [facility] Multi-Cancer Panel + Pancreatitis Genes.  Variants of uncertain significance detected in RECQL4 at c.119-20C>A (Intronic) and SDHA at c.1039A>G (p.Met347Val).  The report date is September 08, 2020.    The Multi-Cancer Panel with pancreatitis genes offered by [facility] includes sequencing and/or deletion duplication testing of the following 89 genes: AIP, ALK, APC, ATM, AXIN2,BAP1,  BARD1, BLM, BMPR1A, BRCA1, BRCA2, BRIP1, CASR, CDC73, CDH1, CDK4, CDKN1B, CDKN1C, CDKN2A (p14ARF), CDKN2A (p16INK4a), CEBPA, CFTR, CHEK2,  CPA1, CTNNA1, CTRC, DICER1, DIS3L2, EGFR (c.2369C>T, p.Thr790Met variant only), EPCAM (Deletion/duplication testing only), FH, FLCN, GATA2, GPC3, GREM1 (Promoter region deletion/duplication testing only), HOXB13 (c.251G>A, p.Gly84Glu), HRAS, KIT, MAX, MEN1, MET, MITF (c.952G>A, p.Glu318Lys variant only), MLH1, MSH2, MSH3, MSH6, MUTYH, NBN, NF1, NF2, NTHL1, PALB2, PDGFRA, PHOX2B, PMS2, POLD1, POLE, POT1, PRKAR1A, PRSS1, PTCH1, PTEN, RAD50, RAD51C, RAD51D, RB1, RECQL4, RET, RNF43, RUNX1, SDHAF2, SDHA (sequence changes only), SDHB, SDHC, SDHD, SMAD4, SMARCA4, SMARCB1, SMARCE1, SPINK1, STK11, SUFU, TERC, TERT, TMEM127, TP53, TSC1, TSC2, VHL, WRN and WT1.    11/04/2020 Imaging   CT CAP  IMPRESSION: 1. Redemonstrated large, hypoenhancing mass of the central right lobe of the liver, which is stable or perhaps slightly enlarged compared to prior examination, measuring 11.5 x 7.0 cm, previously 10.9 x 7.1 cm when measured similarly. Small satellite lesions appear to have enlarged in the interval and there is new tumor involvement of the caudate, overall constellation of findings consistent with worsened cholangiocarcinoma. 2. There is segmental biliary ductal dilatation, primarily seen in the left lobe of the liver, not significantly changed compared to prior examination. The portal veins remain patent. 3. Interval enlargement of a nodule of the medial right lung base, consistent with worsened pulmonary metastatic disease. Additional small nodules are essentially stable and although technically nonspecific remain worrisome for additional small metastases. 4. Aortic atherosclerosis, advanced for patient age and gender. 5. Uterine fibroids.      CURRENT THERAPY:  First line gemcitabine and cisplatin 2 weeks on/1 week off starting 08/16/20 -Addition of Durvalumab q3weeks starting 08/16/20  INTERVAL HISTORY:  Crystal Gibbs is here for a follow up of intrahepatic cholangiocarcinoma. She was last seen by me  on  10/15/20. She presents to the clinic accompanied by her niece (who lives just outside of [place] towards [place]). Her niece acts as our Optometrist today. She reports she continues to feel better with each treatment. She notes she sleeps a lot. She reports she is unable to stand still without leaning on something. She notes this is not the case when she walks. She also reports an intermittent, aching pain to the tumor site (her left flank) that can get up to a 7/10. She reports the flank pain is most intense for the two days following treatment. She notes tramadol gave her nausea and vomiting, and she tries not to take any pain medication. She thinks the reason she had nausea is because she did not eat enough food with it. She reports some minimal dizziness with a certain neck/head position.   All other systems were reviewed with the patient and are negative.  MEDICAL HISTORY:  Past Medical History:  Diagnosis Date   Cancer [facility])    Family history of breast cancer 08/23/2020   Family history of pancreatic cancer 08/23/2020   Family history of prostate cancer 08/23/2020   Family history of stomach cancer 08/23/2020   Fibroid    History of multiple miscarriages    x 3   Hypertension     SURGICAL HISTORY: Past Surgical History:  Procedure Laterality Date   BIOPSY  08/18/2020   Procedure: BIOPSY;  Surgeon: Lavena Bullion, DO;  Location: [facility];  Service: Gastroenterology;;   CATARACT EXTRACTION Bilateral    ESOPHAGOGASTRODUODENOSCOPY (EGD) WITH PROPOFOL N/A 08/18/2020   Procedure: ESOPHAGOGASTRODUODENOSCOPY (EGD) WITH PROPOFOL;  Surgeon: Lavena Bullion, DO;  Location: [facility];  Service: Gastroenterology;  Laterality: N/A;   IR IMAGING GUIDED PORT INSERTION  08/13/2020   PARATHYROIDECTOMY Right 02/19/15    I have reviewed the social history and family history with the patient and they are unchanged from previous note.  ALLERGIES:  has No Known Allergies.  MEDICATIONS:   Current Outpatient Medications  Medication Sig Dispense Refill   amLODipine (NORVASC) 2.5 MG tablet Take 1 tablet (2.5 mg total) by mouth daily. 90 tablet 1   dexamethasone (DECADRON) 4 MG tablet Take 2 tablets at breakfast for 3 days, starting the day after cisplatin 40 tablet 2   diclofenac Sodium (VOLTAREN) 1 % GEL Apply 4 g topically 4 (four) times daily. 50 g 2   ibuprofen (ADVIL) 200 MG tablet Take 400 mg by mouth every 6 (six) hours as needed for headache.     lidocaine-prilocaine (EMLA) cream Apply 1 application topically daily as needed (port access). 30 g 2   magnesium oxide (MAG-OX) 400 (240 Mg) MG tablet Take 1 tablet (400 mg total) by mouth daily. 30 tablet 3   ondansetron (ZOFRAN ODT) 4 MG disintegrating tablet Take 1-2 tablets (4-8 mg total) by mouth every 6 (six) hours as needed for nausea or vomiting. 30 tablet 1   ondansetron (ZOFRAN) 8 MG tablet Take 1 tablet (8 mg total) by mouth 2 (two) times daily as needed. Start on the third day after cisplatin chemotherapy. 30 tablet 1   pantoprazole (PROTONIX) 40 MG tablet Take 1 tablet (40 mg total) by mouth daily. 30 tablet 1   polyethylene glycol powder (GLYCOLAX/MIRALAX) 17 GM/SCOOP powder Take 17 g by mouth 2 (two) times daily as needed. 3350 g 1   prochlorperazine (COMPAZINE) 10 MG tablet Take 1 tablet (10 mg total) by mouth every 6 (six) hours as needed (Nausea or vomiting). 30 tablet 1  No current facility-administered medications for this visit.    PHYSICAL EXAMINATION: ECOG PERFORMANCE STATUS: 2 - Symptomatic, <50% confined to bed  Vitals:   11/25/20 1451  BP: (!) 140/97  Pulse: (!) 124  Temp: 98.2 F (36.8 C)  SpO2: 100%   Wt Readings from Last 3 Encounters:  11/25/20 145 lb 9.6 oz (66 kg)  11/05/20 146 lb (66.2 kg)  11/01/20 147 lb (66.7 kg)     GENERAL:alert, no distress and comfortable SKIN: skin color normal, no rashes or significant lesions EYES: normal, Conjunctiva are pink and non-injected, sclera  clear  NEURO: alert & oriented x 3 with fluent speech  LABORATORY DATA:  I have reviewed the data as listed CBC Latest Ref Rng & Units 11/25/2020 11/11/2020 11/04/2020  WBC 4.0 - 10.5 K/uL 6.1 4.2 10.2  Hemoglobin 12.0 - 15.0 g/dL 9.9(L) 9.4(L) 10.2(L)  Hematocrit 36.0 - 46.0 % 30.3(L) 28.2(L) 31.0(L)  Platelets 150 - 400 K/uL 191 293 240     CMP Latest Ref Rng & Units 11/25/2020 11/11/2020 11/04/2020  Glucose 70 - 99 mg/dL 207(H) 133(H) 94  BUN 6 - 20 mg/dL _0 Creatinine 0.44 - 1.00 mg/dL 0.73 0.63 0.67  Sodium 135 - 145 mmol/L 137 138 139  Potassium 3.5 - 5.1 mmol/L 3.9 4.2 4.1  Chloride 98 - 111 mmol/L 101 102 102  CO2 22 - 32 mmol/L _1 Calcium 8.9 - 10.3 mg/dL 9.5 9.4 9.8  Total Protein 6.5 - 8.1 g/dL 7.5 7.3 7.8  Total Bilirubin 0.3 - 1.2 mg/dL 0.3 0.3 0.3  Alkaline Phos 38 - 126 U/L 198(H) 214(H) 274(H)  AST 15 - 41 U/L _2 ALT 0 - 44 U/L _3 RADIOGRAPHIC STUDIES: I have personally reviewed the radiological images as listed and agreed with the findings in the report. No results found.   ASSESSMENT & PLAN:  Asiana Benninger is a 45 y.o. female with   1. 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 -Her Liver biopsy from 07/26/20 showed adenocarcinoma. I discussed this type of cancer could be from liver primary or from bile duct primary called Cholangiocarcinoma. There is also possibility her primary cancer is elsewhere, although imaging did not show other malignancy outside of liver. I dicussed scans have limitations. -Her 07/10/20 MRI abdomen also shows there is at least 1 satellite lesion centrally in the right lobe and her 07/30/20 CT Chest shows Pulmonary nodules are highly worrisome for metastatic disease.  -Her Upper endoscopy  by Dr [name] on 08/18/20 did not show malignancy.  -I started her on chemo with First line gemcitabine and cisplatin 2 weeks on/1 week off in addition to Durvalumab q3weeks based on recent TOPAZ1 trial data. She started on 08/16/20. The goal of chemotherapy is palliative. -Her 11/04/20 CT CAP showed overall stable disease with mild increase of of her primary tumor and satellite lesion, but still in the range of stable disease.  Her right pulmonary nodule also slightly increased from 4 mm to 8 mm.  -She continues to tolerate treatment relatively well. She feels she is slowly getting better. We will continue her current treatment for two more cycles. -plan for abdomen MRI and chest CT after cycle 6. -labs  and f/u in 3 weeks. They prefer Thursday so the patient's niece can attend with her.   2. Abdominal Pain, Hepatomegaly -secondary to #1 -She has had abdominal pain 2 months before diagnosis. The pain is not daily (mostly with eating and prolonged sitting) but discomfort is mostly daily. -Pain started while she was in [place] and work up was concerning for malignancy. She chose to return to the [place] for more workup.  -Pain has much improved since she started chemo, continue monitoring and tramadol as needed. She has only needed occasionally. She also uses Tylenol. -She has hepatomegaly since 09/20/20 exam. Also demonstrated on 11/04/20 CT.   3. Right shoulder and arm pain -She notes right shoulder pain that radiates down arm. She notes this is not constant and can fluctuate in severity, between 5-8/10. -She has Tramadol for her pain, she can continue or Tylenol/Ibuprofen.  -CT scan was negative for chest wall mass or bone lesions, her pain could be muscular pain in nature   4. Fatigue, Low Appetite  -Secondary to underlying malignancy and chemotherapy -She reports good response to dexamethasone, which unfortunately would be contraindicated due to this immunotherapy. I recommend her not to use it after  chemo.  She agrees. -Given her RUQ pain and Acid Reflux, she is still eating less. So far able to maintain weight. I prescribed Protonix for her Acid Reflux on 09/27/20   5. Social and Acupuncturist -She lives in [place] with her husband, although she was recently frequently in [place]. She is a [place] Citizen. -She and her husband does not speak much [place]. They require interpretor, one of whom they listed as a point of contact. They have a niece that lives nearby who speaks [place]. -She is not working. He has job, but not currently working, as to help his wife. -She has [employer] health insurance. She does not have a PCP currently in the [place]. -Continue f/u with SW and financial advocate for support and to possibly apply for Medicare/Medicaid.   6. Genetic testing -Given her age and family history of GI cancer in her mother, she is eligible for genetic testing.  -Results negative with VUS in SDHA and RECQL4.   7. Indeterminate lung nodules -initially seen on chest CT 07/30/20, most are 30m or less  -increased size of medial right lung base nodule on 11/04/20 scan, other nodules stable.     PLAN:  -C5 Cis/gem tomorrow and next week, durvalumab tomorrow  -labs and chemo in 3 and 4 weeks with f/u in 3 weeks (on Thursdays, so her niece can come with her) -I will order abdomen MRI and chest CT to be done following cycle 6 (5 weeks from now)    No problem-specific Assessment & Plan notes found for this encounter.   Orders Placed This Encounter  Procedures   MR Abdomen W Wo Contrast    Standing Status:   Future    Standing Expiration Date:   11/25/2021    Order Specific Question:   If indicated for the ordered procedure, I authorize the administration of contrast media per Radiology protocol    Answer:   Yes    Order Specific Question:   What is the patient's sedation requirement?    Answer:   No Sedation    Order Specific Question:   Does the patient have a pacemaker or implanted devices?     Answer:   No    Order Specific Question:   Preferred imaging location?    Answer:  [facility] (table limit - 550 lbs)   CT Chest Wo Contrast    Standing Status:   Future    Standing Expiration Date:   11/25/2021    Order Specific Question:   Is patient pregnant?    Answer:   No    Order Specific Question:   Preferred imaging location?    Answer:   [facility]   All questions were answered. The patient knows to call the clinic with any problems, questions or concerns. No barriers to learning was detected. The total time spent in the appointment was 30 minutes.     [name], MD 11/25/2020   I, Wilburn Mylar, am acting as scribe for [name], MD.   I have reviewed the above documentation for accuracy and completeness, and I agree with the above.

## 2020-11-26 ENCOUNTER — Inpatient Hospital Stay: Payer: 59

## 2020-11-26 VITALS — BP 112/73 | HR 95 | Temp 98.4°F | Resp 18

## 2020-11-26 DIAGNOSIS — C221 Intrahepatic bile duct carcinoma: Secondary | ICD-10-CM

## 2020-11-26 DIAGNOSIS — Z5111 Encounter for antineoplastic chemotherapy: Secondary | ICD-10-CM | POA: Diagnosis not present

## 2020-11-26 LAB — CANCER ANTIGEN 19-9: CA 19-9: 37 U/mL — ABNORMAL HIGH (ref 0–35)

## 2020-11-26 LAB — T4: T4, Total: 9.5 ug/dL (ref 4.5–12.0)

## 2020-11-26 MED ORDER — ACETAMINOPHEN 325 MG PO TABS
650.0000 mg | ORAL_TABLET | Freq: Once | ORAL | Status: AC
  Administered 2020-11-26: 650 mg via ORAL
  Filled 2020-11-26: qty 2

## 2020-11-26 MED ORDER — PALONOSETRON HCL INJECTION 0.25 MG/5ML
0.2500 mg | Freq: Once | INTRAVENOUS | Status: AC
  Administered 2020-11-26: 0.25 mg via INTRAVENOUS
  Filled 2020-11-26: qty 5

## 2020-11-26 MED ORDER — SODIUM CHLORIDE 0.9 % IV SOLN: Freq: Once | INTRAVENOUS | Status: AC

## 2020-11-26 MED ORDER — POTASSIUM CHLORIDE IN NACL 20-0.9 MEQ/L-% IV SOLN
Freq: Once | INTRAVENOUS | Status: AC
  Filled 2020-11-26: qty 1000

## 2020-11-26 MED ORDER — MAGNESIUM SULFATE 2 GM/50ML IV SOLN: 2.0000 g | Freq: Once | INTRAVENOUS | Status: DC

## 2020-11-26 MED ORDER — SODIUM CHLORIDE 0.9 % IV SOLN
150.0000 mg | Freq: Once | INTRAVENOUS | Status: AC
  Administered 2020-11-26: 150 mg via INTRAVENOUS
  Filled 2020-11-26: qty 150

## 2020-11-26 MED ORDER — HEPARIN SOD (PORK) LOCK FLUSH 100 UNIT/ML IV SOLN
500.0000 [IU] | Freq: Once | INTRAVENOUS | Status: AC | PRN
  Administered 2020-11-26: 500 [IU]

## 2020-11-26 MED ORDER — MAGNESIUM SULFATE 4 GM/100ML IV SOLN
4.0000 g | Freq: Once | INTRAVENOUS | Status: AC
  Administered 2020-11-26: 4 g via INTRAVENOUS
  Filled 2020-11-26: qty 100

## 2020-11-26 MED ORDER — SODIUM CHLORIDE 0.9 % IV SOLN
1000.0000 mg/m2 | Freq: Once | INTRAVENOUS | Status: AC
  Administered 2020-11-26: 1672 mg via INTRAVENOUS
  Filled 2020-11-26: qty 43.97

## 2020-11-26 MED ORDER — SODIUM CHLORIDE 0.9% FLUSH
10.0000 mL | INTRAVENOUS | Status: DC | PRN
  Administered 2020-11-26: 10 mL

## 2020-11-26 MED ORDER — SODIUM CHLORIDE 0.9 % IV SOLN
1500.0000 mg | Freq: Once | INTRAVENOUS | Status: AC
  Administered 2020-11-26: 1500 mg via INTRAVENOUS
  Filled 2020-11-26: qty 30

## 2020-11-26 MED ORDER — SODIUM CHLORIDE 0.9 % IV SOLN
25.0000 mg/m2 | Freq: Once | INTRAVENOUS | Status: AC
  Administered 2020-11-26: 42 mg via INTRAVENOUS
  Filled 2020-11-26: qty 42

## 2020-11-26 NOTE — Progress Notes (Signed)
Patient has tolerated previous infusions without issues. Ok to infuse durvalumab over 50 minutes today due to drug stability.  Raul Del [place], [place], BCPS, BCOP 11/26/2020 1:31 PM

## 2020-11-26 NOTE — Patient Instructions (Signed)
[facility]  Discharge Instructions: Thank you for choosing [facility] to provide your oncology and hematology care.   If you have a lab appointment with the [facility], please go directly to the [facility] and check in at the registration area.   Wear comfortable clothing and clothing appropriate for easy access to any Portacath or PICC line.   We strive to give you quality time with your provider. You may need to reschedule your appointment if you arrive late (15 or more minutes).  Arriving late affects you and other patients whose appointments are after yours.  Also, if you miss three or more appointments without notifying the office, you may be dismissed from the clinic at the provider's discretion.      For prescription refill requests, have your pharmacy contact our office and allow 72 hours for refills to be completed.    Today you received the following chemotherapy and/or immunotherapy agents Imfinzi, Gemzar & Cisplatin      To help prevent nausea and vomiting after your treatment, we encourage you to take your nausea medication as directed.  BELOW ARE SYMPTOMS THAT SHOULD BE REPORTED IMMEDIATELY: *FEVER GREATER THAN 100.4 F (38 C) OR HIGHER *CHILLS OR SWEATING *NAUSEA AND VOMITING THAT IS NOT CONTROLLED WITH YOUR NAUSEA MEDICATION *UNUSUAL SHORTNESS OF BREATH *UNUSUAL BRUISING OR BLEEDING *URINARY PROBLEMS (pain or burning when urinating, or frequent urination) *BOWEL PROBLEMS (unusual diarrhea, constipation, pain near the anus) TENDERNESS IN MOUTH AND THROAT WITH OR WITHOUT PRESENCE OF ULCERS (sore throat, sores in mouth, or a toothache) UNUSUAL RASH, SWELLING OR PAIN  UNUSUAL VAGINAL DISCHARGE OR ITCHING   Items with * indicate a potential emergency and should be followed up as soon as possible or go to the Emergency Department if any problems should occur.  Please show the CHEMOTHERAPY ALERT CARD or IMMUNOTHERAPY ALERT  CARD at check-in to the Emergency Department and triage nurse.  Should you have questions after your visit or need to cancel or reschedule your appointment, please contact [facility]  Dept: [phone]  and follow the prompts.  Office hours are 8:00 a.m. to 4:30 p.m. Monday - Friday. Please note that voicemails left after 4:00 p.m. may not be returned until the following business day.  We are closed weekends and major holidays. You have access to a nurse at all times for urgent questions. Please call the main number to the clinic Dept: [phone] and follow the prompts.   For any non-urgent questions, you may also contact your provider using MyChart. We now offer e-Visits for anyone 31 and older to request care online for non-urgent symptoms. For details visit [email or web address].   Also download the MyChart app! Go to the app store, search "MyChart", open the app, select [facility], and log in with your MyChart username and password.  Due to Covid, a mask is required upon entering the hospital/clinic. If you do not have a mask, one will be given to you upon arrival. For doctor visits, patients may have 1 support person aged 15 or older with them. For treatment visits, patients cannot have anyone with them due to current Covid guidelines and our immunocompromised population.   Durvalumab injection ?? ??? ??????? ?????????? ?? ????? ?? ??? ???? ????? ???????. ?????? ?? ???? ????? ?????. ???? ??????? ??? ?????? ?????? ????? ???? ???? ??????? ?????? ?? ??????? ??????? ???? ?????. ????? ???????? ???????? ????????:? IMFINZI ?? ?? ??????? ???? ??? ?? ???? ??? ???? ??????? ?????? ??? ????? ??? ??????? ?? ????? ??? ????? ?? ??? ??? ?????? ??? ?? ??? ??????? ????: ????? ??????? ???????? ??? ??? ???? ?? ?????? ??????? ??????? ?? ?????? ??? ???? ?? ???? ?????? ??? ??????? ??????? ??????? (?????? ??????? ??????? ?????? ???? ???) ????? ????? ??????? ????? ?? ??????? ???  ????? ????? ?????? ??????? ??? ????? ?????? ?????? ?? ??????? ????? ????? ?? ??? ??? ???? ?? ?????? ?? ?????????? ?? ??? ??? ???? ?? ?????? ?? ????? ???? ?? ??? ??? ???? ?? ?????? ?? ??????? ?? ??????? ?? ?????? ??????? ???? ?? ????? ????? ??????? ???????? ??? ??? ???? ??????? ??? ??????? ??? ?????? ???? ??????? ???? ????. ??? ????? ??? ?????? ?? ???? ?????? ????????? ?? ?????? ?? ?????. ????? ?? ???? ??? ??? ??? ??? ?? ????. ???? ?? ????? ??? ????????? ?????? ?? ?????. ???? ??? ???? ??????? ???? ??????? ??? ?????? ???????. ?? ???? ???? ???? ???????? ????. ?????? ??????? : ??? ?????? ??? ?????? ???? ????? ???? ?? ??? ??????? ????????? ?????? ?? ?????? ?? ???? ??????? ?????. ??????: ?????? ??? ?????? ?? ???? ??? ???. ?? ????? ????? ??? ?? ????? ?????????. ???? ?? ???? (????) ????? ?? ????? ??? ???? ????. ???? ?????? ?? ?????? ??????? ?????? ??? ??? ??? ??????? ?????? ??? ????. ?? ?? ??????? ???? ?? ?????? ?? ??? ??????? ?? ???? ?????????. ??? ??????? ?? ?? ??? ?? ????????? ????????. ?? ?????? ???? ??????? ?????? ????? ??? ??????? ?? ??????? ?? ??????? ???? ???? ?? ???????? ???????? ???? ????????. ?????? ????? ??? ??? ???? ?? ???? ?????? ?? ?????? ?????? ??????????. ?? ?????? ??? ??????? ?? ?????. ?? ???? ??? ???? ????? ??? ??????? ??? ??????? ??? ?????? ?? ????? ???? ???? ????? ????? ???? ???. ????? ?? ???? ????? ??? ?????? ???? ??? ??? ????? ????? ???????. ??? ????? ??? ????? ???????? ?????? ???? ????? ?????? ???? ??????. ?? ?????? ????? ????? ????? ??? ?????? ?? ????  3 ???? ??? ?????? ?? ??? ??????. ???? ??? ?????? ????? ??????? ??? ?? ????? ?? ????? ?? ?????? ??????. ???? ?????? ???? ???? ?????? ????? ??? ??????. ????? ??? ????? ?? ?????? ??????? ?????? ?? ??????? ?????? ??? ???? ?? ?????????. ?? ????? ?????? ????? ????? ????????? ?? ???? 3 ???? ??? ?????? ?? ??????. ?? ?? ?????? ???????? ???? ???? ?? ??????? ??? ???? ??? ??????? ?????? ???????? ???? ??? ?? ???? ????? ?? ?????? ???????  ?????? ??? ?? ???? ???????: ???????? ??? ????? ?????? ?????? ??????????? (?????) ???? ????? ?? ?????? ?? ??????. ???? ???? ??? ??????? ????? ???? ?? ???? ????? ?????? ???? ?? ???????? ?? ?????? ?????? ?? ?????? ??????? ?????? ?? ?????? ??? ?? ??? ?? ????? ??????? ???????? ???? ?????? ????? ?????? ?????? ?????? ?????? ?????? ??? ???? ??? ??????? ???? ????? ???? ?????? ????? ??? ????? ????????? ???????? ???? ?????? ????? ?? ????? ????????? ?????? ?????? ?????? ?????? ????? ?????? ??? ????? ?? ????? ?????? ?????? ?? ?????? ?????? ????? ?????? ?? ??????? ??????? ???????????? ?????? ?? ????? ??????? ??? ??????? ???????? ??? ?? ????? ?????? ?????? ?? ?????? ????? ?? ????? ??? ???????? ?????? ??????? ?? ????? ??? ?????? ????? ?????? ?? ???? ?? ???? ????? ????? ????? ?? ????? ????? ?????? ???????? ???? ?? ????? ????? ???? (???? ????? ?? ?????? ??????? ????????? ?????? ??? ?????? ???????? ?? ???? ????? ?????): ??? ?????? ????? ????? ?????? ????? ?? ???? ??????? ??????? ??? ?????? ?? ????? ?? ???? ?????? ?????? ??? ??????? ?? ?? ??? ?? ?????? ???????? ????????. ???? ?????? ????????? ?????? ?? ?????? ????????. ????? ??????? ?? ?????? ???????? ?????? ??????? ????????????????? (FDA) ??? ????? ?.[id number]??? ??? ??? ???? ???????? ??????? ??? ????? ??? ?????? ?? ???? ?????? ????? ???? ?? ?????? ?? ?????. ?? ????????? ??? ?????? ??????? ?? ??????. ??????: ??? ??????? ????? ?? ????: ?? ?? ???? ??? ???????? ?? ????????? ???????. ??? ???? ???? ?? ????? ?? ??? ??????? ????? ??? ?????? ?? ??????? ?????? ??????? ??????.  2022 Elsevier/Gold Standard (2019-07-31 00:00:00)  Gemcitabine injection ?? ??? ??????? ?????????? ?? ???????? ????????? ?? ?????? ???????? ?????? ??? ?????? ????? ????? ????? ?? ???????? ??? ????? ????? ?????? ????? ?????? ????????? ????????????. ???? ??????? ??? ?????? ?????? ????? ???? ???? ??????? ?????? ?? ??????? ??????? ???? ?????. ????? ???????? ???????? ????????:?  Gemzar, Infugem ?? ?? ??????? ???? ??? ?? ???? ??? ???? ??????? ?????? ??? ????? ??? ??????? ?? ????? ??? ????? ?? ??? ??? ?????? ??? ?? ??? ??????? ????: ????? ???? ???? ????? ????? ????? ????? ????? ????? ?? ??????? ??? ????? ???? ?????? ???? ?? ????? ?? ??? ??? ???? ?? ?????? ?? ?????????? ?? ????? ?????? ???????? ?????? ?? ??? ??? ???? ?? ?????? ?? ??????? ?????? ?? ??????? ?? ??????? ?? ?????? ??????? ???? ?? ????? ????? ??????? ???????? ??? ??? ???? ??????? ??? ??????? ??? ?????? ???? ??????? ???? ????. ???? ??? ?????? ?? ?????? ?? ????? ??? ???????? ????? ???? ?????? ??????. ???? ??? ???? ??????? ???? ??????? ??? ?????? ???????. ?? ???? ???? ???? ???????? ????. ?????? ??????? : ??? ?????? ??? ?????? ???? ????? ???? ?? ??? ??????? ????????? ?????? ?? ?????? ?? ???? ??????? ?????. ??????: ?????? ??? ?????? ?? ???? ??? ???. ?? ????? ????? ??? ?? ????? ?????????. ???? ?? ???? (????) ????? ?? ????? ??? ???? ????. ???? ?????? ?? ?????? ??????? ?????? ??? ??? ??? ??????? ?????? ??? ????. ?? ?? ??????? ???? ?? ?????? ?? ??? ??????? ??? ??????? ???? ???? ?? ????? ???? ???? ?????????? ?? ???????????? ?? ???????????? ??? ?????? ?????? ???????? ?????? ??? ????????? ???????? ???? ??? ????? ?? ?????? ??????? ?????? ??? ????? ?? ?? ??? ???????: ???????????? ???????? ???????????? ?????????? ????????? ??? ??????? ?? ?? ??? ?? ????????? ????????. ?? ?????? ???? ??????? ?????? ????? ??? ??????? ?? ??????? ?? ??????? ???? ???? ?? ???????? ???????? ???? ????????. ?????? ????? ??? ??? ???? ?? ???? ?????? ?? ?????? ?????? ??????????. ?? ?????? ??? ??????? ?? ?????. ?? ???? ??? ???? ????? ??? ??????? ??? ??????? ?? ?????? ????? ?? ?????? ??????? ?????? ??????? ????? ???????. ??? ?????? ?? ????? ???? ???? ????? ????? ???? ???. ?? ??? ??? ????? ??? ?????? ??? ?????? ???????? ???? ?? ???? ??? ??????? ?????? ??? ???? ??? ??????? ?????????. ???? ?? ?? ???? ??????. ????? ?? ???? ????? ??? ?? ???? ???? ???  ??? ????? ????????????. ?? ??? ???????? ?? ??? ?????? ????? ?????? ???????? ?? ???? ?????? ????????.???? ????????? ?????? ??????????. ????? ????? ?? ?????? ??????? ?????? ??? ???? ????? ?? ??????? ?? ??? ????? ?? ????? ???? ?? ????? ????? ?? ??????????. ?? ????? ???? ?????. ??? ?????? ?????? ?? ????? ???? ??? ?????? ??????. ???? ???? ???????? ???????? ??????. ??? ?????? ?? ???? ?? ????? ???? ??????? ?? ??????. ???? ?????? ?? ????????????? ?????? ??? ????? ?? ???? ??? ?????. ???? ??? ?????? ?? ??????? ???? ??????? ?? ??????? ????? ??????? ???? ?? ???? ????? ?? ???? ?????? ????. ???? ???? ??????? ???? ?????? ??? ?????? ??? ????????? ????? ??? ??????. ???? ????? ?????? ????? ??? ???????? ?? ?????????????? ?? ???????????? ?? ????????? ?? ?????????? ??? ??? ????? ?????? ????. ??? ??????? ?? ???? ??????????????. ????? ??? ?????? ?????? ??????? ??? ?????? ???? ????? ????? ????? ??? ?????? ?????  6 ???? ??? ?????? ?? ????? ??? ??????. ???? ??? ?????? ????? ??????? ??? ?? ????? ?? ????? ?? ?????? ??????. ????? ??? ?????? ??? ????? ??? ????? ????? ??? ?????? ????? 3 ???? ??? ?????? ?? ??? ??????. ???? ?????? ???? ???? ?????? ????? ??? ??????. ????? ??? ????? ?? ?????? ??????? ?????? ?? ??????? ?????? ??? ???? ?? ?????????. ?? ????? ?????? ????? ????? ??? ?????? ?? ???? ????????? ??? ????? ??? ?????? ?? ??????. ????? ?????? ????? ??????? ??????? ?? ????? ???. ??? ?????? ?? ???? ?? ????????? ???????. ??? ??? ????? ???? ???????? ???? ??? ????? ?? ?????? ?????????????. ?? ?? ?????? ???????? ???? ???? ?? ??????? ??? ???? ??? ??????? ?????? ???????? ???? ??? ?? ???? ????? ?? ?????? ??????? ?????? ??? ?? ???? ???????: ???????? ??? ????? ?????? ?????? ??????????? (?????) ???? ????? ?? ?????? ?? ??????. ????? ?????? ??? ?? ?????? ?? ??? ?? ?????? ?? ????? ?????? ?????? ?????? ?????? ?????? ?? ????? ????? ?? ??? ???? ?????? ??? ??? ?????? ??????? ???? ?? ??? ?????? ????? ?????? ???????? ????? ?????? ????? ??? ???????  ??????? ?? ?????? - ??????? ????? ??? ????? ??? ??? ?????? ??? ????? ??????? ?????? ??? ??????? ????? ?? ?? ????? ????? ?????? ??? ????? ???? ??????? - ??? ?? ??? ??? ????? ?? ????? ??????? ?? ?????? ?????? ?????? - ????? ?????????? ?? ?????? ?? ?????? ????? ?? ?????? ???? ?? ????? ?? ?????? ?????? ?????? ????? ?????? ??? ????? ?????? ?? ?????? ?? ???? ????? ?????? ?????? ????? ?????? ??? ????? ?? ????? ?????? ?????? ?? ?????? ?????? ????? ?????? ?? ??????? ??????? ???????????? ?????? ?? ????? ??????? ??? ??????? ???????? ??? ?? ????? ?????? ?????? ?? ?????? ????? ?? ????? ??? ???????? ?????? ??????? ?? ????? ??? ?????? ?? ????? ?? ???? ?????? ???????? ???? ?? ????? ????? ???? (???? ????? ?? ?????? ??????? ????????? ?????? ?? ???? ????? ?????): ????? ????? ????? ????? ????? ?????? ??????? ??? ??? ??? ??????? ?? ?? ??? ?? ?????? ???????? ????????. ???? ?????? ????????? ?????? ?? ?????? ????????. ????? ??????? ?? ?????? ???????? ?????? ??????? ????????????????? (FDA) ??? ????? ?.[id number]??? ??? ??? ???? ???????? ??????? ??? ????? ??? ?????? ?? ???? ?????? ????? ???? ?? ?????? ?? ?????. ?? ????????? ??? ?????? ??????? ?? ??????. ??????: ??? ??????? ????? ?? ????: ?? ?? ???? ??? ???????? ?? ????????? ???????. ??? ???? ???? ?? ????? ?? ??? ??????? ????? ??? ?????? ?? ??????? ?????? ??????? ??????.  2022 Elsevier/Gold Standard (2017-08-07 00:00:00)  Cisplatin injection ?? ??? ??????? ????????? ?? ???????? ????????? ?? ?????? ????????. ?????? ??? ?????? ??????? ????? ???????? ??? ??????? ????????? ????? ???? ??? ???????. ?????? ??? ??????????? ????? ????? ?? ???????? ??? ????? ??????? ??????? ???????. ???? ??????? ??? ?????? ?????? ????? ???? ???? ??????? ?????? ?? ??????? ??????? ???? ?????. ????? ???????? ???????? ????????:? Platinol, Platinol -AQ ?? ?? ??????? ???? ??? ?? ???? ??? ???? ??????? ?????? ??? ????? ??? ??????? ?? ????? ??? ????? ?? ??? ??? ?????? ??? ?? ??? ???????  ????: ????? ?????? ????? ?????? ????? ?? ????? ????? ????? ??? ??? ?????? ????? ??? ???? ???? ???????? ?? ??????? ???????? ?? ???? ???? ??????? ????? ????? ???? ?????? ?? ??????? ???? ??? ?? ??? ??? ???? ?? ?????? ?? ?????????? ?? ???????????? ?? ????????????? ?? ??? ??? ???? ?? ?????? ?? ??????? ?????? ?? ??????? ?? ??????? ?? ?????? ??????? ???? ?? ????? ????? ??????? ???????? ??? ??? ???? ??????? ??? ??????? ??? ?????? ???? ??????? ???? ????. ???? ??? ?????? ?? ?????? ?? ????? ??? ???????? ????? ???? ?????? ??????. ???? ??? ???? ??????? ???? ??????? ??? ?????? ???????. ?? ???? ???? ???? ???????? ????. ?????? ??????? : ??? ?????? ??? ?????? ???? ????? ???? ?? ??? ??????? ????????? ?????? ?? ?????? ?? ???? ??????? ?????. ??????: ?????? ??? ?????? ?? ???? ??? ???. ?? ????? ????? ??? ?? ????? ?????????. ???? ?? ???? (????) ????? ?? ????? ??? ???? ????. ???? ?????? ?? ?????? ??????? ?????? ??? ??? ??? ??????? ?????? ??? ????. ?? ?? ??????? ???? ?? ?????? ?? ??? ??????? ??? ?????? ???? ?? ?????? ?? ??????? ???????: ????????? ??? ???????? ??????? ??? ????????? ?? ??????????? ?? ????????? ?? ?????????? ?? ?? ????????????? ?? ??????????? ?? ?????????? ??? ??????? ?? ?? ??? ?? ????????? ????????. ?? ?????? ???? ??????? ?????? ????? ??? ??????? ?? ??????? ?? ??????? ???? ???? ?? ???????? ???????? ???? ????????. ?????? ????? ??? ??? ???? ?? ???? ?????? ?? ?????? ?????? ??????????. ?? ?????? ??? ??????? ?? ?????. ?? ???? ??? ???? ????? ??? ??????? ??? ??????? ??? ??? ??????? ?? ??? ????? ???? ????? ??????. ??? ????? ??? ????? ???????????? ???? ????? ?????? ???? ??????. ??? ?????? ?? ????? ???? ???? ????? ????? ???? ???. ?? ??? ??? ????? ??? ?????? ??? ?????? ???????? ???? ?? ???? ??? ??????? ?????? ??? ???? ??? ??????? ?????????. ???? ?? ?? ???? ??????. ????? ?? ???? ????? ??? ?? ???? ???? ??? ???????? ????? ???????. ??? ?????? ?? ???? ?? ??? ??????? ?????. ????? ????? ?? ?????? ??????? ?????? ???  ???? ?????? ?? ???????? ?? ??? ?????? ?? ????? ???? ?? ????? ????? ????????????. ?? ????? ???? ?????. ???? ???? ???????? ???????? ??????. ???? ????? ??????? ???? ????? ??? ???????? ?? ?????????????? ?? ???????????? ?? ????????? ?? ?????????? ??? ??? ????? ?????? ?? ?????? ??????? ?????? ????. ?????????? ?? ???? ??????? ???????. ??? ?????? ?? ???? ?? ????? ???? ??????? ?? ??????. ???? ?????? ?? ????????????? ?????? ??? ????? ?? ???? ??? ?????. ???? ??? ?????? ?? ??????? ???? ??????? ?? ??????? ????? ??????? ???? ?? ???? ????? ?? ???? ?????? ????. ???? ???? ??????? ???? ?????? ??? ?????? ??? ????????? ????? ??? ??????. ?? ?????? ????? ????? ????? ??? ?????? ?? ????  14 ????? ??? ?????? ?? ??????. ??? ??? ?????? ????? ?????? ??????? ?????? ??? ?? ????? ?? ????? ?? ?????? ??????. ????? ??? ?????? ??? ????? ??? ????? ????? ??? ?????? ????? 11 ????? ??? ?????? ?? ??????. ???? ?????? ???? ???? ?????? ????? ??? ??????. ????? ???????? ?? ?????? ??????? ?????? ?????? ??? ???? ?? ?????????. ?? ?????? ???? ????? ????? ??? ??????. ????? ??? ?????? ?? ??? ?????? ??? ??? ??????. ??? ?????? ???? ????? ????. ?????? ????? ???? ???????? ???? ??? ????? ?? ?????? ??????? ??????. ???? ??? ?????? ?? ????? ??? ????????? ??????? ??? ??? ??????. ??? ???? ????? ????? ????? ??????. ??? ??? ????? ???? ???????? ???? ??? ????? ?? ????????????? ??????. ???? ??????? ??? ????????? ????? ???? ?????? ???? ??????. ??? ??????? ??? ???????????. ???? ????? ?? ?????? ??????? ?????? ??? ???? ??????. ?? ????? ???? ?????. ?? ?? ?????? ???????? ???? ???? ?? ??????? ??? ???? ??? ??????? ?????? ???????? ???? ??? ?? ???? ????? ?? ?????? ??????? ?????? ??? ?? ???? ???????: ???????? ??? ????? ?????? ?????? ??????????? (?????) ???? ????? ?? ?????? ?? ??????. ??? ??? ?????? ?????? ?? ?????? ??? ?????? ??? ????? ?? ???? ?? ????? ????? ?? ??? ??? ?? ?????? ?? ??? ?? ?????? ?? ????? ?????? ??? ?? ????? ?? ??? ?? ?????? ?? ??????? ?????? ?????? ??????  ??? ?????? ?????? ?? ?????? ??? ???????? ?? ??? ???? ?? ??? ????? ?? ????? ?? ?? ???? ???? ???? ?????? ???????? ?? ??????? ?? ??? ????? ??? ?????? ?? ????? ?? ???? ??? ?????? ?? ????? ?? ????? ?? ????? ?????? ?????? ?????? ??? ?????? ?? ?????????? ??????? ?????? ?????? ?? ?????? ????? ?? ????? ?? ?????? ?????? ?????? ????? ?????? ??? ????? ?????? ?? ?????? ?? ???? ????? ?????? ?????? ?????? ??? ????? ???? ??????? ?? ??? ????? ??? ??? ?? ??? ??? ?????? ?????? ??????? ?? ???????? ??????? ????? ?????? ?????? ???????? ???? ?? ????? ????? ???? (???? ????? ?? ?????? ??????? ????????? ?????? ?? ???? ????? ?????): ????? ?????? ??? ???? ?????? ?? ?????? ??????? ??? ??????? ?? ?? ??? ?? ?????? ???????? ????????. ???? ?????? ????????? ?????? ?? ?????? ????????. ????? ??????? ?? ?????? ???????? ?????? ??????? ????????????????? (FDA) ??? ????? ?.[id number]??? ??? ??? ???? ???????? ??????? ??? ????? ??? ?????? ?? ???? ?????? ????? ???? ?? ?????? ?? ?????. ?? ????????? ??? ?????? ??????? ?? ??????. ??????: ??? ??????? ????? ?? ????: ?? ?? ???? ??? ???????? ?? ????????? ???????. ??? ???? ???? ?? ????? ?? ??? ??????? ????? ??? ?????? ?? ??????? ?????? ??????? ??????.  2022 Elsevier/Gold Standard (2019-07-31 00:00:00)

## 2020-12-02 MED FILL — Fosaprepitant Dimeglumine For IV Infusion 150 MG (Base Eq): INTRAVENOUS | Qty: 5 | Status: AC

## 2020-12-03 ENCOUNTER — Inpatient Hospital Stay: Payer: 59

## 2020-12-03 ENCOUNTER — Other Ambulatory Visit: Payer: Self-pay

## 2020-12-03 ENCOUNTER — Other Ambulatory Visit: Payer: Self-pay | Admitting: Hematology

## 2020-12-03 ENCOUNTER — Inpatient Hospital Stay: Payer: 59 | Admitting: Hematology

## 2020-12-03 VITALS — BP 132/84 | HR 88 | Temp 98.4°F | Resp 18 | Wt 143.5 lb

## 2020-12-03 DIAGNOSIS — C221 Intrahepatic bile duct carcinoma: Secondary | ICD-10-CM

## 2020-12-03 DIAGNOSIS — Z5111 Encounter for antineoplastic chemotherapy: Secondary | ICD-10-CM | POA: Diagnosis not present

## 2020-12-03 DIAGNOSIS — Z95828 Presence of other vascular implants and grafts: Secondary | ICD-10-CM

## 2020-12-03 LAB — MAGNESIUM: Magnesium: 1.7 mg/dL (ref 1.7–2.4)

## 2020-12-03 LAB — CBC WITH DIFFERENTIAL (CANCER CENTER ONLY)
Abs Immature Granulocytes: 0.02 10*3/uL (ref 0.00–0.07)
Basophils Absolute: 0 10*3/uL (ref 0.0–0.1)
Basophils Relative: 1 %
Eosinophils Absolute: 0.1 10*3/uL (ref 0.0–0.5)
Eosinophils Relative: 2 %
HCT: 28.3 % — ABNORMAL LOW (ref 36.0–46.0)
Hemoglobin: 9.6 g/dL — ABNORMAL LOW (ref 12.0–15.0)
Immature Granulocytes: 1 %
Lymphocytes Relative: 39 %
Lymphs Abs: 1.7 10*3/uL (ref 0.7–4.0)
MCH: 31.5 pg (ref 26.0–34.0)
MCHC: 33.9 g/dL (ref 30.0–36.0)
MCV: 92.8 fL (ref 80.0–100.0)
Monocytes Absolute: 0.3 10*3/uL (ref 0.1–1.0)
Monocytes Relative: 6 %
Neutro Abs: 2.2 10*3/uL (ref 1.7–7.7)
Neutrophils Relative %: 51 %
Platelet Count: 226 10*3/uL (ref 150–400)
RBC: 3.05 MIL/uL — ABNORMAL LOW (ref 3.87–5.11)
RDW: 15.1 % (ref 11.5–15.5)
WBC Count: 4.2 10*3/uL (ref 4.0–10.5)
nRBC: 0 % (ref 0.0–0.2)

## 2020-12-03 LAB — CMP (CANCER CENTER ONLY)
ALT: 24 U/L (ref 0–44)
AST: 29 U/L (ref 15–41)
Albumin: 3.6 g/dL (ref 3.5–5.0)
Alkaline Phosphatase: 224 U/L — ABNORMAL HIGH (ref 38–126)
Anion gap: 9 (ref 5–15)
BUN: 16 mg/dL (ref 6–20)
CO2: 27 mmol/L (ref 22–32)
Calcium: 9.5 mg/dL (ref 8.9–10.3)
Chloride: 102 mmol/L (ref 98–111)
Creatinine: 0.7 mg/dL (ref 0.44–1.00)
GFR, Estimated: >60 mL/min (ref >60)
Glucose, Bld: 191 mg/dL — ABNORMAL HIGH (ref 70–99)
Potassium: 4.2 mmol/L (ref 3.5–5.1)
Sodium: 138 mmol/L (ref 135–145)
Total Bilirubin: 0.3 mg/dL (ref 0.3–1.2)
Total Protein: 7.7 g/dL (ref 6.5–8.1)

## 2020-12-03 LAB — TSH: TSH: 2.604 u[IU]/mL (ref 0.308–3.960)

## 2020-12-03 LAB — PREGNANCY, URINE: Preg Test, Ur: NEGATIVE

## 2020-12-03 MED ORDER — SODIUM CHLORIDE 0.9 % IV SOLN: Freq: Once | INTRAVENOUS | Status: AC

## 2020-12-03 MED ORDER — SODIUM CHLORIDE 0.9 % IV SOLN
1000.0000 mg/m2 | Freq: Once | INTRAVENOUS | Status: AC
  Administered 2020-12-03: 1672 mg via INTRAVENOUS
  Filled 2020-12-03: qty 43.97

## 2020-12-03 MED ORDER — SODIUM CHLORIDE 0.9% FLUSH
10.0000 mL | Freq: Once | INTRAVENOUS | Status: AC
  Administered 2020-12-03: 10 mL

## 2020-12-03 MED ORDER — MAGNESIUM OXIDE -MG SUPPLEMENT 400 (240 MG) MG PO TABS: 400.0000 mg | ORAL_TABLET | Freq: Every day | ORAL | 3 refills | Status: DC

## 2020-12-03 MED ORDER — POTASSIUM CHLORIDE IN NACL 20-0.9 MEQ/L-% IV SOLN
Freq: Once | INTRAVENOUS | Status: AC
  Filled 2020-12-03: qty 1000

## 2020-12-03 MED ORDER — SODIUM CHLORIDE 0.9 % IV SOLN: Freq: Once | INTRAVENOUS | Status: DC

## 2020-12-03 MED ORDER — SODIUM CHLORIDE 0.9% FLUSH
10.0000 mL | INTRAVENOUS | Status: DC | PRN
  Administered 2020-12-03: 10 mL

## 2020-12-03 MED ORDER — PALONOSETRON HCL INJECTION 0.25 MG/5ML
0.2500 mg | Freq: Once | INTRAVENOUS | Status: AC
  Administered 2020-12-03: 0.25 mg via INTRAVENOUS
  Filled 2020-12-03: qty 5

## 2020-12-03 MED ORDER — ACETAMINOPHEN 325 MG PO TABS
650.0000 mg | ORAL_TABLET | Freq: Once | ORAL | Status: AC
  Administered 2020-12-03: 650 mg via ORAL
  Filled 2020-12-03: qty 2

## 2020-12-03 MED ORDER — HEPARIN SOD (PORK) LOCK FLUSH 100 UNIT/ML IV SOLN
500.0000 [IU] | Freq: Once | INTRAVENOUS | Status: AC | PRN
  Administered 2020-12-03: 500 [IU]

## 2020-12-03 MED ORDER — MAGNESIUM SULFATE 2 GM/50ML IV SOLN
2.0000 g | Freq: Once | INTRAVENOUS | Status: AC
  Administered 2020-12-03: 2 g via INTRAVENOUS
  Filled 2020-12-03: qty 50

## 2020-12-03 MED ORDER — SODIUM CHLORIDE 0.9 % IV SOLN
150.0000 mg | Freq: Once | INTRAVENOUS | Status: AC
  Administered 2020-12-03: 150 mg via INTRAVENOUS
  Filled 2020-12-03: qty 150

## 2020-12-03 MED ORDER — SODIUM CHLORIDE 0.9 % IV SOLN
25.0000 mg/m2 | Freq: Once | INTRAVENOUS | Status: AC
  Administered 2020-12-03: 42 mg via INTRAVENOUS
  Filled 2020-12-03: qty 42

## 2020-12-03 NOTE — Patient Instructions (Signed)
[facility]  Discharge Instructions: Thank you for choosing [facility] to provide your oncology and hematology care.   If you have a lab appointment with the [facility], please go directly to the [facility] and check in at the registration area.   Wear comfortable clothing and clothing appropriate for easy access to any Portacath or PICC line.   We strive to give you quality time with your provider. You may need to reschedule your appointment if you arrive late (15 or more minutes).  Arriving late affects you and other patients whose appointments are after yours.  Also, if you miss three or more appointments without notifying the office, you may be dismissed from the clinic at the provider's discretion.      For prescription refill requests, have your pharmacy contact our office and allow 72 hours for refills to be completed.    Today you received the following chemotherapy and/or immunotherapy agents gemzar/cisplatin       To help prevent nausea and vomiting after your treatment, we encourage you to take your nausea medication as directed.  BELOW ARE SYMPTOMS THAT SHOULD BE REPORTED IMMEDIATELY: *FEVER GREATER THAN 100.4 F (38 C) OR HIGHER *CHILLS OR SWEATING *NAUSEA AND VOMITING THAT IS NOT CONTROLLED WITH YOUR NAUSEA MEDICATION *UNUSUAL SHORTNESS OF BREATH *UNUSUAL BRUISING OR BLEEDING *URINARY PROBLEMS (pain or burning when urinating, or frequent urination) *BOWEL PROBLEMS (unusual diarrhea, constipation, pain near the anus) TENDERNESS IN MOUTH AND THROAT WITH OR WITHOUT PRESENCE OF ULCERS (sore throat, sores in mouth, or a toothache) UNUSUAL RASH, SWELLING OR PAIN  UNUSUAL VAGINAL DISCHARGE OR ITCHING   Items with * indicate a potential emergency and should be followed up as soon as possible or go to the Emergency Department if any problems should occur.  Please show the CHEMOTHERAPY ALERT CARD or IMMUNOTHERAPY ALERT CARD at  check-in to the Emergency Department and triage nurse.  Should you have questions after your visit or need to cancel or reschedule your appointment, please contact [facility]  Dept: [phone]  and follow the prompts.  Office hours are 8:00 a.m. to 4:30 p.m. Monday - Friday. Please note that voicemails left after 4:00 p.m. may not be returned until the following business day.  We are closed weekends and major holidays. You have access to a nurse at all times for urgent questions. Please call the main number to the clinic Dept: [phone] and follow the prompts.   For any non-urgent questions, you may also contact your provider using MyChart. We now offer e-Visits for anyone 17 and older to request care online for non-urgent symptoms. For details visit [email or web address].   Also download the MyChart app! Go to the app store, search "MyChart", open the app, select [facility], and log in with your MyChart username and password.  Due to Covid, a mask is required upon entering the hospital/clinic. If you do not have a mask, one will be given to you upon arrival. For doctor visits, patients may have 1 support person aged 60 or older with them. For treatment visits, patients cannot have anyone with them due to current Covid guidelines and our immunocompromised population.

## 2020-12-04 LAB — T4: T4, Total: 10.4 ug/dL (ref 4.5–12.0)

## 2020-12-04 LAB — CANCER ANTIGEN 19-9: CA 19-9: 43 U/mL — ABNORMAL HIGH (ref 0–35)

## 2020-12-05 ENCOUNTER — Encounter: Payer: Self-pay | Admitting: Hematology

## 2020-12-05 NOTE — Progress Notes (Deleted)
    SUBJECTIVE:   CHIEF COMPLAINT / HPI: follow up ***   ***   PERTINENT  PMH / PSH:  Intrahepatic cholangiocarcinoma  HTN   OBJECTIVE:   There were no vitals taken for this visit.  General: female appearing stated age in no acute distress HEENT: MMM, no oral lesions noted,Neck non-tender without lymphadenopathy, masses or thyromegaly*** Cardio: Normal S1 and S2, no S3 or S4. Rhythm is regular***. No murmurs or rubs.  Bilateral radial pulses palpable Pulm: Clear to auscultation bilaterally, no crackles, wheezing, or diminished breath sounds. Normal respiratory effort, stable on *** Abdomen: Bowel sounds normal. Abdomen soft and non-tender. *** Extremities: No peripheral edema. Warm/ well perfused. *** Neuro: pt alert and oriented x4    ASSESSMENT/PLAN:   No problem-specific Assessment & Plan notes found for this encounter.     [name], MD [place] [facility]

## 2020-12-06 ENCOUNTER — Ambulatory Visit: Payer: 59 | Admitting: Family Medicine

## 2020-12-06 ENCOUNTER — Other Ambulatory Visit: Payer: 59 | Admitting: Hospice

## 2020-12-06 ENCOUNTER — Other Ambulatory Visit: Payer: Self-pay

## 2020-12-10 ENCOUNTER — Other Ambulatory Visit: Payer: 59 | Admitting: Hospice

## 2020-12-10 ENCOUNTER — Other Ambulatory Visit: Payer: Self-pay

## 2020-12-10 DIAGNOSIS — R110 Nausea: Secondary | ICD-10-CM

## 2020-12-10 DIAGNOSIS — C221 Intrahepatic bile duct carcinoma: Secondary | ICD-10-CM

## 2020-12-10 DIAGNOSIS — Z515 Encounter for palliative care: Secondary | ICD-10-CM

## 2020-12-10 DIAGNOSIS — I10 Essential (primary) hypertension: Secondary | ICD-10-CM

## 2020-12-10 NOTE — Progress Notes (Addendum)
[facility] Consult Note Telephone: [phone]  Fax: [phone]  PATIENT NAME: Crystal Gibbs [address] [place] [address] [phone] (home)  DOB: 20-Apr-1975 MRN: [id number]  PRIMARY CARE PROVIDER:    [name], MD,  [place] [place] [address] [phone]  REFERRING PROVIDER:   [name], [address],  [place] [address] [phone]  RESPONSIBLE PARTY:   Self/spouse Contact Information     Name Relation Home Work [place] Spouse [phone]  [phone]   Julaine Hua Niece [phone]  [phone]       I met face to face with patient and family at home. Palliative Care was asked to follow this patient by consultation request of  [facility], [facility]* to address advance care planning, complex medical decision making and goals of care clarification. Spouse Alsabi Audie Clear was present with patient during visit. This is the initial visit.    ASSESSMENT AND / RECOMMENDATIONS:   Advance Care Planning: Our advance care planning conversation included a discussion about:    The value and importance of advance care planning  Difference between Hospice and Palliative care Exploration of goals of care in the event of a sudden injury or illness  Identification and preparation of a healthcare agent  Review and updating or creation of an  advance directive document . Decision not to resuscitate or to de-escalate disease focused treatments due to poor prognosis.  CODE STATUS: Full code status is desired due to religious reasons. Patient is a Full code  Goals of Care: Goals include to maximize quality of life and symptom management  I spent 46 minutes providing this initial consultation. More than 50% of the time in this consultation was spent on counseling patient and coordinating  communication. --------------------------------------------------------------------------------------------------------------------------------------  Symptom Management/Plan: Weakness: Related to chemo treatment. Encouraged movement/exercise as tolerated for optimal wellbeing. Balance of rest and performance activity. 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 Routine CBC CMP Generalized body pain: Managed with Tylenol and Tramadol Nausea: She reports nausea is mild and well managed with Zofran HTN: Managed with Amlodipine.  Follow up: Palliative care will continue to follow for complex medical decision making, advance care planning, and clarification of goals. Return 6 weeks or prn.Encouraged to call provider sooner with any concerns.   Family /Caregiver/Community Supports: Patient lives at home with her sister and plans to soon go back to live with her husband.  Strong family support system identified.  HOSPICE ELIGIBILITY/DIAGNOSIS: TBD  Chief Complaint: Initial Palliative care visit  HISTORY OF PRESENT ILLNESS:  Crystal Gibbs is a 45 y.o. 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  She reports occasional nausea, managed with Zofran.  She reports intermittent weakness worsened especially 1 to 3 days after chemo session. Weakness is worsened during ADLs. Resting is helpful.  History of hypertension, generalized body pain.  She denies pain/discomfort during visit, no nausea/vomiting.  History obtained from review of EMR, discussion with primary team, caregiver, family and/or Ms. Ursua.  Review and summarization of Epic records shows history from other than patient. Rest of 10 point ROS asked and negative.     Review of lab tests/diagnostics   Results for TENYA, ARAQUE (MRN [id number]) as of 12/10/2020 15:42  Ref. Range 12/03/2020 08:31  Sodium Latest Ref  Range: 135 - 145 mmol/L 138  Potassium Latest Ref Range: 3.5 - 5.1 mmol/L 4.2  Chloride  Latest Ref Range: 98 - 111 mmol/L 102  CO2 Latest Ref Range: 22 - 32 mmol/L 27  Glucose Latest Ref Range: 70 - 99 mg/dL 191 (H)  BUN Latest Ref Range: 6 - 20 mg/dL 16  Creatinine Latest Ref Range: 0.44 - 1.00 mg/dL 0.70  Calcium Latest Ref Range: 8.9 - 10.3 mg/dL 9.5  Anion gap Latest Ref Range: 5 - 15  9  Magnesium Latest Ref Range: 1.7 - 2.4 mg/dL 1.7  Alkaline Phosphatase Latest Ref Range: 38 - 126 U/L 224 (H)  Albumin Latest Ref Range: 3.5 - 5.0 g/dL 3.6  AST Latest Ref Range: 15 - 41 U/L 29  ALT Latest Ref Range: 0 - 44 U/L 24  Total Protein Latest Ref Range: 6.5 - 8.1 g/dL 7.7  Total Bilirubin Latest Ref Range: 0.3 - 1.2 mg/dL 0.3  GFR, Est Non African American Latest Ref Range: >60 mL/min >60  WBC Latest Ref Range: 4.0 - 10.5 K/uL 4.2  RBC Latest Ref Range: 3.87 - 5.11 MIL/uL 3.05 (L)  Hemoglobin Latest Ref Range: 12.0 - 15.0 g/dL 9.6 (L)  HCT Latest Ref Range: 36.0 - 46.0 % 28.3 (L)  MCV Latest Ref Range: 80.0 - 100.0 fL 92.8  MCH Latest Ref Range: 26.0 - 34.0 pg 31.5  MCHC Latest Ref Range: 30.0 - 36.0 g/dL 33.9  RDW Latest Ref Range: 11.5 - 15.5 % 15.1  Platelets Latest Ref Range: 150 - 400 K/uL 226  nRBC Latest Ref Range: 0.0 - 0.2 % 0.0   ROS General: NAD EYES: denies vision changes ENMT: denies dysphagia Cardiovascular: denies chest pain/discomfort Pulmonary: denies cough, denies SOB Abdomen: endorses good appetite, denies constipation/diarrhea, endorses occasional nausea GU: denies dysuria, urinary frequency MSK:  endorses weakness,  no falls reported Skin: denies rashes or wounds Neurological: denies pain, denies insomnia Psych: Endorses positive mood Heme/lymph/immuno: denies bruises, abnormal bleeding  Physical Exam: Constitutional: NAD General: Well groomed, cooperative EYES: anicteric sclera, lids intact, no discharge  ENMT: Moist mucous membrane CV: S1 S2, RRR, no  LE edema Pulmonary: LCTA, no increased work of breathing, no cough, Abdomen: active BS + 4 quadrants, soft and non tender GU: no suprapubic tenderness MSK: weakness,  ambulatory without assistive device Skin: warm and dry, no rashes or wounds on visible skin Neuro:  weakness, otherwise non focal Psych: non-anxious affect Hem/lymph/immuno: no widespread bruising   PAST MEDICAL HISTORY:  Active Ambulatory Problems    Diagnosis Date Noted   Fibroid, uterine 09/30/2014   AMA (advanced maternal age) primigravida 35+ 09/30/2014   Health care maintenance 09/03/2015   Breast cancer screening, high risk patient 09/03/2015   Primary hyperparathyroidism ([place]) 02/09/2015   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   Hypercalcemia 07/02/2020   Disorder of parathyroid gland ([facility]) 07/02/2020   History of cataract 07/02/2020   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   Intrahepatic cholangiocarcinoma ([place]) 08/04/2020   Port-A-Cath in place 08/16/2020   Malignant neoplasm of liver ([facility])    Gastritis and gastroduodenitis    Abnormal uterine bleeding (AUB) 08/19/2020   Elevated blood pressure reading 08/19/2020   Family history of breast cancer 08/23/2020   Family history of pancreatic cancer 08/23/2020   Family history of prostate cancer 08/23/2020   Family history of stomach cancer 08/23/2020   HTN (hypertension) 09/09/2020   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   Genetic testing 09/15/2020   Headache 09/23/2020   Left sided abdominal pain 11/01/2020   Resolved Ambulatory Problems    Diagnosis Date Noted   No Resolved Ambulatory  Problems   Past Medical History:  Diagnosis Date   Cancer ([place])    Fibroid    History of multiple miscarriages    Hypertension     SOCIAL HX:  Social History   Tobacco Use   Smoking status: Never   Smokeless tobacco: Never  Substance Use Topics   Alcohol use: No     FAMILY HX:  Family History  Problem Relation Age of Onset   Pancreatic cancer  Mother 62   Diabetes Father    Liver cancer Father 79   Diabetes Sister    Breast cancer Sister 61   Prostate cancer Paternal Uncle        dx unknown age   Stomach cancer Maternal Grandfather        dx 33s-80s   Prostate cancer Cousin        dx 64s?   Prostate cancer Paternal Uncle        dx unknown age; mets      ALLERGIES: No Known Allergies    PERTINENT MEDICATIONS:  Outpatient Encounter Medications as of 12/10/2020  Medication Sig   amLODipine (NORVASC) 2.5 MG tablet Take 1 tablet (2.5 mg total) by mouth daily.   dexamethasone (DECADRON) 4 MG tablet Take 2 tablets at breakfast for 3 days, starting the day after cisplatin   diclofenac Sodium (VOLTAREN) 1 % GEL Apply 4 g topically 4 (four) times daily.   ibuprofen (ADVIL) 200 MG tablet Take 400 mg by mouth every 6 (six) hours as needed for headache.   lidocaine-prilocaine (EMLA) cream Apply 1 application topically daily as needed (port access).   magnesium oxide (MAG-OX) 400 (240 Mg) MG tablet Take 1 tablet (400 mg total) by mouth daily.   ondansetron (ZOFRAN ODT) 4 MG disintegrating tablet Take 1-2 tablets (4-8 mg total) by mouth every 6 (six) hours as needed for nausea or vomiting.   ondansetron (ZOFRAN) 8 MG tablet Take 1 tablet (8 mg total) by mouth 2 (two) times daily as needed. Start on the third day after cisplatin chemotherapy.   pantoprazole (PROTONIX) 40 MG tablet Take 1 tablet (40 mg total) by mouth daily.   polyethylene glycol powder (GLYCOLAX/MIRALAX) 17 GM/SCOOP powder Take 17 g by mouth 2 (two) times daily as needed.   prochlorperazine (COMPAZINE) 10 MG tablet Take 1 tablet (10 mg total) by mouth every 6 (six) hours as needed (Nausea or vomiting).   No facility-administered encounter medications on file as of 12/10/2020.     Thank you for the opportunity to participate in the care of Ms. Henken.  The palliative care team will continue to follow. Please call our office at [phone] if we can be of additional  assistance.   Note: Portions of this note were generated with [employer]. Dictation errors may occur despite best attempts at proofreading.  Teodoro Spray, NP

## 2020-12-16 ENCOUNTER — Other Ambulatory Visit: Payer: 59

## 2020-12-16 ENCOUNTER — Other Ambulatory Visit: Payer: Self-pay

## 2020-12-16 ENCOUNTER — Inpatient Hospital Stay: Payer: 59 | Attending: Hematology | Admitting: Hematology

## 2020-12-16 ENCOUNTER — Ambulatory Visit: Payer: 59

## 2020-12-16 ENCOUNTER — Inpatient Hospital Stay: Payer: 59

## 2020-12-16 ENCOUNTER — Encounter: Payer: Self-pay | Admitting: Hematology

## 2020-12-16 VITALS — BP 143/99 | HR 117 | Temp 98.6°F | Resp 18 | Ht 66.0 in | Wt 147.3 lb

## 2020-12-16 DIAGNOSIS — M25511 Pain in right shoulder: Secondary | ICD-10-CM | POA: Diagnosis not present

## 2020-12-16 DIAGNOSIS — G893 Neoplasm related pain (acute) (chronic): Secondary | ICD-10-CM | POA: Insufficient documentation

## 2020-12-16 DIAGNOSIS — Z79899 Other long term (current) drug therapy: Secondary | ICD-10-CM | POA: Insufficient documentation

## 2020-12-16 DIAGNOSIS — K219 Gastro-esophageal reflux disease without esophagitis: Secondary | ICD-10-CM | POA: Insufficient documentation

## 2020-12-16 DIAGNOSIS — C221 Intrahepatic bile duct carcinoma: Secondary | ICD-10-CM | POA: Insufficient documentation

## 2020-12-16 DIAGNOSIS — Z5111 Encounter for antineoplastic chemotherapy: Secondary | ICD-10-CM | POA: Diagnosis not present

## 2020-12-16 DIAGNOSIS — I10 Essential (primary) hypertension: Secondary | ICD-10-CM | POA: Insufficient documentation

## 2020-12-16 DIAGNOSIS — M79601 Pain in right arm: Secondary | ICD-10-CM | POA: Diagnosis not present

## 2020-12-16 DIAGNOSIS — Z5189 Encounter for other specified aftercare: Secondary | ICD-10-CM | POA: Diagnosis not present

## 2020-12-16 DIAGNOSIS — Z95828 Presence of other vascular implants and grafts: Secondary | ICD-10-CM

## 2020-12-16 LAB — CMP (CANCER CENTER ONLY)
ALT: 12 U/L (ref 0–44)
AST: 26 U/L (ref 15–41)
Albumin: 3.7 g/dL (ref 3.5–5.0)
Alkaline Phosphatase: 183 U/L — ABNORMAL HIGH (ref 38–126)
Anion gap: 11 (ref 5–15)
BUN: 12 mg/dL (ref 6–20)
CO2: 25 mmol/L (ref 22–32)
Calcium: 9.7 mg/dL (ref 8.9–10.3)
Chloride: 102 mmol/L (ref 98–111)
Creatinine: 0.76 mg/dL (ref 0.44–1.00)
GFR, Estimated: >60 mL/min (ref >60)
Glucose, Bld: 162 mg/dL — ABNORMAL HIGH (ref 70–99)
Potassium: 3.7 mmol/L (ref 3.5–5.1)
Sodium: 138 mmol/L (ref 135–145)
Total Bilirubin: 0.3 mg/dL (ref 0.3–1.2)
Total Protein: 7.6 g/dL (ref 6.5–8.1)

## 2020-12-16 LAB — TSH: TSH: 0.629 u[IU]/mL (ref 0.308–3.960)

## 2020-12-16 LAB — CBC WITH DIFFERENTIAL (CANCER CENTER ONLY)
Abs Immature Granulocytes: 0.01 10*3/uL (ref 0.00–0.07)
Basophils Absolute: 0 10*3/uL (ref 0.0–0.1)
Basophils Relative: 0 %
Eosinophils Absolute: 0.1 10*3/uL (ref 0.0–0.5)
Eosinophils Relative: 2 %
HCT: 29.7 % — ABNORMAL LOW (ref 36.0–46.0)
Hemoglobin: 9.8 g/dL — ABNORMAL LOW (ref 12.0–15.0)
Immature Granulocytes: 0 %
Lymphocytes Relative: 31 %
Lymphs Abs: 1.4 10*3/uL (ref 0.7–4.0)
MCH: 30.8 pg (ref 26.0–34.0)
MCHC: 33 g/dL (ref 30.0–36.0)
MCV: 93.4 fL (ref 80.0–100.0)
Monocytes Absolute: 0.5 10*3/uL (ref 0.1–1.0)
Monocytes Relative: 12 %
Neutro Abs: 2.6 10*3/uL (ref 1.7–7.7)
Neutrophils Relative %: 55 %
Platelet Count: 199 10*3/uL (ref 150–400)
RBC: 3.18 MIL/uL — ABNORMAL LOW (ref 3.87–5.11)
RDW: 15.6 % — ABNORMAL HIGH (ref 11.5–15.5)
WBC Count: 4.6 10*3/uL (ref 4.0–10.5)
nRBC: 0 % (ref 0.0–0.2)

## 2020-12-16 LAB — MAGNESIUM: Magnesium: 1.5 mg/dL — ABNORMAL LOW (ref 1.7–2.4)

## 2020-12-16 LAB — PREGNANCY, URINE: Preg Test, Ur: NEGATIVE

## 2020-12-16 MED ORDER — SODIUM CHLORIDE 0.9% FLUSH
10.0000 mL | Freq: Once | INTRAVENOUS | Status: AC
  Administered 2020-12-16: 10 mL

## 2020-12-16 MED ORDER — MAGNESIUM OXIDE -MG SUPPLEMENT 400 (240 MG) MG PO TABS: 400.0000 mg | ORAL_TABLET | Freq: Every day | ORAL | 3 refills | Status: DC

## 2020-12-16 MED ORDER — PANTOPRAZOLE SODIUM 40 MG PO TBEC: 40.0000 mg | DELAYED_RELEASE_TABLET | Freq: Every day | ORAL | 3 refills | Status: DC

## 2020-12-16 MED FILL — Fosaprepitant Dimeglumine For IV Infusion 150 MG (Base Eq): INTRAVENOUS | Qty: 5 | Status: AC

## 2020-12-16 NOTE — Progress Notes (Signed)
[facility]   Telephone:([phone]) [phone] Fax:(336) [phone]   Clinic Follow up Note   Patient Care Team: [name], MD as PCP - General (Family Medicine) [name], MD as Consulting Physician (Oncology) [name], RN (Inactive) as Oncology Nurse Navigator  Date of Service:  12/16/2020  CHIEF COMPLAINT: f/u of intrahepatic cholangiocarcinoma  CURRENT THERAPY:  First line gemcitabine and cisplatin 2 weeks on/1 week off starting 08/16/20 -Addition of Durvalumab q3weeks starting 08/16/20  ASSESSMENT & PLAN:  Crystal Gibbs is a 45 y.o. female with   1. 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 -Her Liver biopsy from 07/26/20 showed adenocarcinoma. There is possibility her primary cancer is elsewhere, although imaging did not show other malignancy outside of liver. -Her 07/10/20 MRI abdomen also shows there is at least 1 satellite lesion centrally in the right lobe and her 07/30/20 CT Chest shows Pulmonary nodules are highly worrisome for metastatic disease.  -Her Upper endoscopy by Dr [name] on 08/18/20 did not show malignancy.  -I started her on chemo with First line gemcitabine and cisplatin 2 weeks on/1 week off in addition to Durvalumab q3weeks based on recent TOPAZ1 trial data. She started on 08/16/20.  -Her 11/04/20 CT CAP showed overall stable disease with mild increase of of her primary tumor and satellite lesion, but still in the range of stable disease.   -She continues to tolerate treatment relatively well. She feels she is slowly getting better. We will plan for CT and MRI following this cycle. -Lab reviewed, adequate for treatment, will proceed chemo and durvalumab tomorrow. -labs and f/u in 3 weeks. They prefer Thursday so the patient's niece can  attend with her.   2. Abdominal Pain, Hepatomegaly, acid reflux -secondary to #1 -She had abdominal pain 2 months before diagnosis. The pain is not daily (mostly with eating and prolonged sitting) but discomfort is mostly daily. -Pain started while she was in [place] and work up was concerning for malignancy. She chose to return to the [place] for more workup.  -Pain has much improved since she started chemo, continue monitoring and tramadol as needed. She has only needed occasionally. She also uses Tylenol. -continue protonix    3. Right shoulder and arm pain -She notes right shoulder pain that radiates down arm. She notes this is not constant and can fluctuate in severity, between 5-8/10. -She has Tramadol for her pain, she can continue or Tylenol/Ibuprofen.  -CT scan was negative for chest wall mass or bone lesions, her pain could be muscular pain in nature   5. Social and Acupuncturist -She lives in [place] with her husband, although she was recently frequently in [place]. She is a [place] Citizen. -She and her husband does not speak much [place]. They require interpretor, one of whom they listed as a point of contact. They have a niece that lives nearby who speaks [place]. -She is not working. He has job, but not currently working, as to help his wife. -She has [employer] health insurance. She does not have a PCP currently in the [place]. -Continue f/u with SW and financial advocate for support and to possibly apply for Medicare/Medicaid.   6. Genetic testing -Given her age and family history of GI cancer in her mother, she is eligible for genetic testing.  -Results negative  with VUS in [place] and RECQL4.   7. Indeterminate lung nodules -initially seen on chest CT 07/30/20, most are 27m or less  -increased size of medial right lung base nodule on 11/04/20 scan, other nodules stable.     PLAN:  -Lab reviewed, adequate for treatment, will proceed C6 Cis/gem tomorrow and next week, durvalumab tomorrow   -we will scheduled the ordered CT/MRI scans to be done in 2-3 weeks -labs and f/u in 3 weeks (on Thursdays, so her niece can come with her)   No problem-specific Assessment & Plan notes found for this encounter.   SUMMARY OF ONCOLOGIC HISTORY: Oncology History Overview Note  Cancer Staging Intrahepatic cholangiocarcinoma ([place] Staging form: Intrahepatic Bile Duct, AJCC 8th Edition - Clinical: Stage IB (cT1b, cN0, cM0) - Signed by FTruitt Merle MD on 08/04/2020    Intrahepatic cholangiocarcinoma ([facility]  08/19/2019 Procedure   Upper Endoscopy by Dr CBryan Lemma IMPRESSION - Normal esophagus. - Z-line regular, 35 cm from the incisors. - Gastritis. Biopsied. - Normal incisura, antrum and pylorus. - Erythematous duodenopathy. Biopsied. - Normal second portion of the duodenum and third portion of the duodenum. - No evidence of primary malignancy site noted on this study.   07/10/2020 Imaging   [place] 07/10/20 IMPRESSION: 1.  No acute hepatobiliary findings.   2. Heterogeneous liver echotexture with 3 masslike areas as described measuring 4.3 cm, 5.4 cm and 6.4 cm respectively. Recommend CT abdomen/pelvis with intravenous contrast for further evaluation.   07/10/2020 Imaging   CT AP 07/10/20  IMPRESSION: 1. There is a large masslike collection or mass in the central liver measuring at least 10.9 x 7.1 x 10.2 cm with 2 smaller adjacent satellite lesions/collections measuring 11 and 17 mm. These findings may represent a large abscess with satellite abscesses or a large neoplasm/malignancy. Recommend MRI for further evaluation. 2. Left renal cyst. 3. Calcified atherosclerosis in the distal abdominal aorta is mild. 4. Fibroid uterus.   07/10/2020 Imaging   MRI Abdomen 07/10/20  IMPRESSION: 1. The large central hepatic mass does not demonstrate signal characteristics or enhancement typical of a hemangioma or abscess, and is suspicious for malignancy. There is at least 1 satellite lesion  centrally in the right lobe. As there are no signs of underlying cirrhosis, primary considerations include peripheral cholangiocarcinoma and metastatic disease. Tissue sampling recommended. 2. Mild intrahepatic biliary dilatation within the left hepatic lobe. Distortion of the hepatic vasculature without evidence of tumor thrombus. 3. No extrahepatic metastatic disease or primary malignancy identified within the abdomen.   07/26/2020 Imaging   CT AP 07/26/20  IMPRESSION: 1. Markedly poorly visualized and ill-defined 12 cm lesion within the liver. This lesion is better evaluated on MR abdomen 07/10/2020. 2. Otherwise no acute intra-abdominal abnormality with markedly limited evaluation on this noncontrast study. 3.  Aortic Atherosclerosis (ICD10-I70.0).   07/26/2020 Initial Diagnosis   FINAL MICROSCOPIC DIAGNOSIS: 07/26/20  A. LIVER MASS, LEFT, NEEDLE CORE BIOPSY:  - Adenocarcinoma.   ADDENDUM: The adenocarcinoma is positive with immunohistochemistry for cytokeratin 7 and PAX 8.  The carcinoma is negative with cytokeratin 20, CDX 2, estrogen receptor, GATA3, GCDFP, monoclonal and polyclonal CEA, cytokeratin 5/6, TTF-1, Napsin A, HepPar1, arginase 1 and WT1.  The morphology and immunophenotype are consistent with adenocarcinoma.  The differential for possible primary sites is broad including kidney although the morphology is not typical for more common types of renal cell carcinoma.  Pancreaticobiliary and gynecologic cannot be ruled out.    07/30/2020 Imaging   CT Chest 07/30/20  IMPRESSION: 1. Pulmonary nodules are highly worrisome for metastatic disease. 2. Hepatic masses are consistent with biopsy-proven adenocarcinoma and better evaluated on MR abdomen 07/10/2020.   08/04/2020 Initial Diagnosis   Intrahepatic cholangiocarcinoma ([facility])   08/04/2020 Cancer Staging   Staging form: Intrahepatic Bile Duct, AJCC 8th Edition - Clinical: Stage IB (cT1b, cN0, cM0) - Signed by [name], MD on  08/04/2020   08/13/2020 Procedure   PAC placement   08/16/2020 -  Chemotherapy   First line gemcitabine and cisplatin 2 weeks on/1 week off starting 08/16/20     08/16/2020 -  Chemotherapy   -Addition of durvalumab q3weeks with first-line chemo starting 08/16/20    08/18/2020 Procedure   Upper Endoscopy by Dr [name] IMPRESSION - Normal esophagus. - Z-line regular, 35 cm from the incisors. - Gastritis. Biopsied. - Normal incisura, antrum and pylorus. - Erythematous duodenopathy. Biopsied. - Normal second portion of the duodenum and third portion of the duodenum. - No evidence of primary malignancy site noted on this study.   08/18/2020 Pathology Results   FINAL MICROSCOPIC DIAGNOSIS:   A. DUODENUM, BULB, BIOPSY:  -  Erosive and reactive duodenal mucosa with Brunner's gland hyperplasia and mixed inflammation  -  No dysplasia or malignancy identified   B. STOMACH, BIOPSY:  -  Chronic active gastritis  -  H. pylori organisms present  -  No intestinal metaplasia identified  -  See comment   COMMENT:  Warthin-Starry stain is POSITIVE for organisms consistent with  Helicobacter pylori.    09/08/2020 Genetic Testing   Negative hereditary cancer genetic testing: no pathogenic variants detected in [facility] Multi-Cancer Panel + Pancreatitis Genes.  Variants of uncertain significance detected in RECQL4 at c.119-20C>A (Intronic) and SDHA at c.1039A>G (p.Met347Val).  The report date is September 08, 2020.    The Multi-Cancer Panel with pancreatitis genes offered by [facility] includes sequencing and/or deletion duplication testing of the following 89 genes: AIP, ALK, APC, ATM, AXIN2,BAP1,  BARD1, BLM, BMPR1A, BRCA1, BRCA2, BRIP1, CASR, CDC73, CDH1, CDK4, CDKN1B, CDKN1C, CDKN2A (p14ARF), CDKN2A (p16INK4a), CEBPA, CFTR, CHEK2, CPA1, CTNNA1, CTRC, DICER1, DIS3L2, EGFR (c.2369C>T, p.Thr790Met variant only), EPCAM (Deletion/duplication testing only), FH, FLCN, GATA2, GPC3, GREM1 (Promoter region  deletion/duplication testing only), HOXB13 (c.251G>A, p.Gly84Glu), HRAS, KIT, MAX, MEN1, MET, MITF (c.952G>A, p.Glu318Lys variant only), MLH1, MSH2, MSH3, MSH6, MUTYH, NBN, NF1, NF2, NTHL1, PALB2, PDGFRA, PHOX2B, PMS2, POLD1, POLE, POT1, PRKAR1A, PRSS1, PTCH1, PTEN, RAD50, RAD51C, RAD51D, RB1, RECQL4, RET, RNF43, RUNX1, SDHAF2, SDHA (sequence changes only), SDHB, SDHC, SDHD, SMAD4, SMARCA4, SMARCB1, SMARCE1, SPINK1, STK11, SUFU, TERC, TERT, TMEM127, TP53, TSC1, TSC2, VHL, WRN and WT1.    11/04/2020 Imaging   CT CAP  IMPRESSION: 1. Redemonstrated large, hypoenhancing mass of the central right lobe of the liver, which is stable or perhaps slightly enlarged compared to prior examination, measuring 11.5 x 7.0 cm, previously 10.9 x 7.1 cm when measured similarly. Small satellite lesions appear to have enlarged in the interval and there is new tumor involvement of the caudate, overall constellation of findings consistent with worsened cholangiocarcinoma. 2. There is segmental biliary ductal dilatation, primarily seen in the left lobe of the liver, not significantly changed compared to prior examination. The portal veins remain patent. 3. Interval enlargement of a nodule of the medial right lung base, consistent with worsened pulmonary metastatic disease. Additional small nodules are essentially stable and although technically nonspecific remain worrisome for additional small metastases. 4. Aortic atherosclerosis, advanced for patient age and gender. 5. Uterine fibroids.      INTERVAL  HISTORY:  Crystal Gibbs is here for a follow up of intrahepatic cholangiocarcinoma. She was last seen by me on 11/25/20. She presents to the clinic accompanied by her niece, who acts as our Optometrist. She reports she is able to recover in her off week. She notes she walks around to stay active. She reports trouble staying asleep at night and takes several hours to fall back asleep. This is partially due to hot flashes.  She notes she does still feel rested at the end of the night. She reports some body pains-- around her port that goes around her neck/shoulder to her back.   All other systems were reviewed with the patient and are negative.  MEDICAL HISTORY:  Past Medical History:  Diagnosis Date   Cancer [facility])    Family history of breast cancer 08/23/2020   Family history of pancreatic cancer 08/23/2020   Family history of prostate cancer 08/23/2020   Family history of stomach cancer 08/23/2020   Fibroid    History of multiple miscarriages    x 3   Hypertension     SURGICAL HISTORY: Past Surgical History:  Procedure Laterality Date   BIOPSY  08/18/2020   Procedure: BIOPSY;  Surgeon: Lavena Bullion, DO;  Location: [facility];  Service: Gastroenterology;;   CATARACT EXTRACTION Bilateral    ESOPHAGOGASTRODUODENOSCOPY (EGD) WITH PROPOFOL N/A 08/18/2020   Procedure: ESOPHAGOGASTRODUODENOSCOPY (EGD) WITH PROPOFOL;  Surgeon: Lavena Bullion, DO;  Location: [facility];  Service: Gastroenterology;  Laterality: N/A;   IR IMAGING GUIDED PORT INSERTION  08/13/2020   PARATHYROIDECTOMY Right 02/19/15    I have reviewed the social history and family history with the patient and they are unchanged from previous note.  ALLERGIES:  has No Known Allergies.  MEDICATIONS:  Current Outpatient Medications  Medication Sig Dispense Refill   amLODipine (NORVASC) 2.5 MG tablet Take 1 tablet (2.5 mg total) by mouth daily. 90 tablet 1   dexamethasone (DECADRON) 4 MG tablet Take 2 tablets at breakfast for 3 days, starting the day after cisplatin 40 tablet 2   diclofenac Sodium (VOLTAREN) 1 % GEL Apply 4 g topically 4 (four) times daily. 50 g 2   lidocaine-prilocaine (EMLA) cream Apply 1 application topically daily as needed (port access). 30 g 2   magnesium oxide (MAG-OX) 400 (240 Mg) MG tablet Take 1 tablet (400 mg total) by mouth daily. 30 tablet 3   ondansetron (ZOFRAN ODT) 4 MG disintegrating tablet Take 1-2  tablets (4-8 mg total) by mouth every 6 (six) hours as needed for nausea or vomiting. 30 tablet 1   ondansetron (ZOFRAN) 8 MG tablet Take 1 tablet (8 mg total) by mouth 2 (two) times daily as needed. Start on the third day after cisplatin chemotherapy. 30 tablet 1   pantoprazole (PROTONIX) 40 MG tablet Take 1 tablet (40 mg total) by mouth daily. 30 tablet 3   polyethylene glycol powder (GLYCOLAX/MIRALAX) 17 GM/SCOOP powder Take 17 g by mouth 2 (two) times daily as needed. 3350 g 1   prochlorperazine (COMPAZINE) 10 MG tablet Take 1 tablet (10 mg total) by mouth every 6 (six) hours as needed (Nausea or vomiting). 30 tablet 1   No current facility-administered medications for this visit.    PHYSICAL EXAMINATION: ECOG PERFORMANCE STATUS: 2 - Symptomatic, <50% confined to bed  Vitals:   12/16/20 1444  BP: (!) 143/99  Pulse: (!) 117  Resp: 18  Temp: 98.6 F (37 C)  SpO2: 100%   Wt Readings from Last 3 Encounters:  12/16/20 147 lb 4.8 oz (66.8 kg)  12/03/20 143 lb 8 oz (65.1 kg)  11/25/20 145 lb 9.6 oz (66 kg)     GENERAL:alert, no distress and comfortable SKIN: skin color normal, no rashes or significant lesions EYES: normal, Conjunctiva are pink and non-injected, sclera clear  NEURO: alert & oriented x 3 with fluent speech  LABORATORY DATA:  I have reviewed the data as listed CBC Latest Ref Rng & Units 12/16/2020 12/03/2020 11/25/2020  WBC 4.0 - 10.5 K/uL 4.6 4.2 6.1  Hemoglobin 12.0 - 15.0 g/dL 9.8(L) 9.6(L) 9.9(L)  Hematocrit 36.0 - 46.0 % 29.7(L) 28.3(L) 30.3(L)  Platelets 150 - 400 K/uL 199 226 191     CMP Latest Ref Rng & Units 12/16/2020 12/03/2020 11/25/2020  Glucose 70 - 99 mg/dL 162(H) 191(H) 207(H)  BUN 6 - 20 mg/dL 12 16 16   Creatinine 0.44 - 1.00 mg/dL 0.76 0.70 0.73  Sodium 135 - 145 mmol/L 138 138 137  Potassium 3.5 - 5.1 mmol/L 3.7 4.2 3.9  Chloride 98 - 111 mmol/L 102 102 101  CO2 22 - 32 mmol/L 25 27 25   Calcium 8.9 - 10.3 mg/dL 9.7 9.5 9.5  Total Protein 6.5 -  8.1 g/dL 7.6 7.7 7.5  Total Bilirubin 0.3 - 1.2 mg/dL 0.3 0.3 0.3  Alkaline Phos 38 - 126 U/L 183(H) 224(H) 198(H)  AST 15 - 41 U/L 26 29 24   ALT 0 - 44 U/L 12 24 13       RADIOGRAPHIC STUDIES: I have personally reviewed the radiological images as listed and agreed with the findings in the report. No results found.    No orders of the defined types were placed in this encounter.  All questions were answered. The patient knows to call the clinic with any problems, questions or concerns. No barriers to learning was detected. The total time spent in the appointment was 30 minutes.     [name], MD 12/16/2020   I, Wilburn Mylar, am acting as scribe for [name], MD.   I have reviewed the above documentation for accuracy and completeness, and I agree with the above.

## 2020-12-16 NOTE — Progress Notes (Signed)
Patient came in for port flush with lab appointment today and wanted to stay accessed for treatment tomorrow.  Patient requested sensitive skin dressing. Used paper tape as well.

## 2020-12-17 ENCOUNTER — Inpatient Hospital Stay: Payer: 59

## 2020-12-17 VITALS — BP 117/48 | HR 71 | Temp 97.9°F | Resp 17

## 2020-12-17 DIAGNOSIS — Z5111 Encounter for antineoplastic chemotherapy: Secondary | ICD-10-CM | POA: Diagnosis not present

## 2020-12-17 DIAGNOSIS — C221 Intrahepatic bile duct carcinoma: Secondary | ICD-10-CM

## 2020-12-17 LAB — CANCER ANTIGEN 19-9: CA 19-9: 40 U/mL — ABNORMAL HIGH (ref 0–35)

## 2020-12-17 LAB — T4: T4, Total: 9.2 ug/dL (ref 4.5–12.0)

## 2020-12-17 MED ORDER — SODIUM CHLORIDE 0.9 % IV SOLN: Freq: Once | INTRAVENOUS | Status: AC

## 2020-12-17 MED ORDER — ACETAMINOPHEN 325 MG PO TABS
ORAL_TABLET | ORAL | Status: AC
  Filled 2020-12-17: qty 2

## 2020-12-17 MED ORDER — SODIUM CHLORIDE 0.9% FLUSH
10.0000 mL | INTRAVENOUS | Status: DC | PRN
  Administered 2020-12-17: 10 mL

## 2020-12-17 MED ORDER — ACETAMINOPHEN 325 MG PO TABS
650.0000 mg | ORAL_TABLET | Freq: Once | ORAL | Status: AC
  Administered 2020-12-17: 650 mg via ORAL

## 2020-12-17 MED ORDER — SODIUM CHLORIDE 0.9 % IV SOLN
1500.0000 mg | Freq: Once | INTRAVENOUS | Status: AC
  Administered 2020-12-17: 1500 mg via INTRAVENOUS
  Filled 2020-12-17: qty 30

## 2020-12-17 MED ORDER — SODIUM CHLORIDE 0.9 % IV SOLN
1000.0000 mg/m2 | Freq: Once | INTRAVENOUS | Status: AC
  Administered 2020-12-17: 1672 mg via INTRAVENOUS
  Filled 2020-12-17: qty 43.97

## 2020-12-17 MED ORDER — MAGNESIUM SULFATE 2 GM/50ML IV SOLN
2.0000 g | Freq: Once | INTRAVENOUS | Status: AC
  Administered 2020-12-17: 2 g via INTRAVENOUS

## 2020-12-17 MED ORDER — POTASSIUM CHLORIDE IN NACL 20-0.9 MEQ/L-% IV SOLN
Freq: Once | INTRAVENOUS | Status: AC
  Filled 2020-12-17: qty 1000

## 2020-12-17 MED ORDER — PALONOSETRON HCL INJECTION 0.25 MG/5ML
0.2500 mg | Freq: Once | INTRAVENOUS | Status: AC
  Administered 2020-12-17: 0.25 mg via INTRAVENOUS

## 2020-12-17 MED ORDER — PALONOSETRON HCL INJECTION 0.25 MG/5ML
INTRAVENOUS | Status: AC
  Filled 2020-12-17: qty 5

## 2020-12-17 MED ORDER — SODIUM CHLORIDE 0.9 % IV SOLN
25.0000 mg/m2 | Freq: Once | INTRAVENOUS | Status: AC
  Administered 2020-12-17: 42 mg via INTRAVENOUS
  Filled 2020-12-17: qty 42

## 2020-12-17 MED ORDER — SODIUM CHLORIDE 0.9 % IV SOLN
150.0000 mg | Freq: Once | INTRAVENOUS | Status: AC
  Administered 2020-12-17: 150 mg via INTRAVENOUS
  Filled 2020-12-17: qty 150

## 2020-12-17 MED ORDER — MAGNESIUM SULFATE 2 GM/50ML IV SOLN
INTRAVENOUS | Status: AC
  Filled 2020-12-17: qty 50

## 2020-12-17 MED ORDER — HEPARIN SOD (PORK) LOCK FLUSH 100 UNIT/ML IV SOLN
500.0000 [IU] | Freq: Once | INTRAVENOUS | Status: AC | PRN
  Administered 2020-12-17: 500 [IU]

## 2020-12-17 NOTE — Patient Instructions (Signed)
[facility]  Discharge Instructions: Thank you for choosing [facility] to provide your oncology and hematology care.   If you have a lab appointment with the [facility], please go directly to the [facility] and check in at the registration area.   Wear comfortable clothing and clothing appropriate for easy access to any Portacath or PICC line.   We strive to give you quality time with your provider. You may need to reschedule your appointment if you arrive late (15 or more minutes).  Arriving late affects you and other patients whose appointments are after yours.  Also, if you miss three or more appointments without notifying the office, you may be dismissed from the clinic at the provider's discretion.      For prescription refill requests, have your pharmacy contact our office and allow 72 hours for refills to be completed.    Today you received the following chemotherapy and/or immunotherapy agents Gemcitabine, Cisplatin, and Durvalumab.      To help prevent nausea and vomiting after your treatment, we encourage you to take your nausea medication as directed.  BELOW ARE SYMPTOMS THAT SHOULD BE REPORTED IMMEDIATELY: *FEVER GREATER THAN 100.4 F (38 C) OR HIGHER *CHILLS OR SWEATING *NAUSEA AND VOMITING THAT IS NOT CONTROLLED WITH YOUR NAUSEA MEDICATION *UNUSUAL SHORTNESS OF BREATH *UNUSUAL BRUISING OR BLEEDING *URINARY PROBLEMS (pain or burning when urinating, or frequent urination) *BOWEL PROBLEMS (unusual diarrhea, constipation, pain near the anus) TENDERNESS IN MOUTH AND THROAT WITH OR WITHOUT PRESENCE OF ULCERS (sore throat, sores in mouth, or a toothache) UNUSUAL RASH, SWELLING OR PAIN  UNUSUAL VAGINAL DISCHARGE OR ITCHING   Items with * indicate a potential emergency and should be followed up as soon as possible or go to the Emergency Department if any problems should occur.  Please show the CHEMOTHERAPY ALERT CARD or  IMMUNOTHERAPY ALERT CARD at check-in to the Emergency Department and triage nurse.  Should you have questions after your visit or need to cancel or reschedule your appointment, please contact [facility]  Dept: [phone]  and follow the prompts.  Office hours are 8:00 a.m. to 4:30 p.m. Monday - Friday. Please note that voicemails left after 4:00 p.m. may not be returned until the following business day.  We are closed weekends and major holidays. You have access to a nurse at all times for urgent questions. Please call the main number to the clinic Dept: [phone] and follow the prompts.   For any non-urgent questions, you may also contact your provider using MyChart. We now offer e-Visits for anyone 43 and older to request care online for non-urgent symptoms. For details visit [email or web address].   Also download the MyChart app! Go to the app store, search "MyChart", open the app, select [facility], and log in with your MyChart username and password.  Due to Covid, a mask is required upon entering the hospital/clinic. If you do not have a mask, one will be given to you upon arrival. For doctor visits, patients may have 1 support person aged 43 or older with them. For treatment visits, patients cannot have anyone with them due to current Covid guidelines and our immunocompromised population.

## 2020-12-17 NOTE — Progress Notes (Signed)
An addition 2gm of Mg+ given this treatment d/t pt's Mg+ 1.5 today.  Pt got a total of 4gm of Mg+ this treatment. Per verbal order from [name].

## 2020-12-17 NOTE — Progress Notes (Signed)
Per [name], "OK To give prehydration over 1hr and to give post hydration with the Cisplatin infusing today."

## 2020-12-23 ENCOUNTER — Other Ambulatory Visit: Payer: Self-pay

## 2020-12-23 ENCOUNTER — Other Ambulatory Visit: Payer: Self-pay | Admitting: Hematology

## 2020-12-23 ENCOUNTER — Inpatient Hospital Stay: Payer: 59

## 2020-12-23 VITALS — BP 135/97 | HR 98 | Temp 98.1°F | Resp 18 | Ht 66.0 in | Wt 148.0 lb

## 2020-12-23 DIAGNOSIS — C221 Intrahepatic bile duct carcinoma: Secondary | ICD-10-CM

## 2020-12-23 DIAGNOSIS — Z95828 Presence of other vascular implants and grafts: Secondary | ICD-10-CM

## 2020-12-23 DIAGNOSIS — Z5111 Encounter for antineoplastic chemotherapy: Secondary | ICD-10-CM | POA: Diagnosis not present

## 2020-12-23 LAB — CBC WITH DIFFERENTIAL (CANCER CENTER ONLY)
Abs Immature Granulocytes: 0.01 10*3/uL (ref 0.00–0.07)
Basophils Absolute: 0 10*3/uL (ref 0.0–0.1)
Basophils Relative: 2 %
Eosinophils Absolute: 0.1 10*3/uL (ref 0.0–0.5)
Eosinophils Relative: 3 %
HCT: 30.1 % — ABNORMAL LOW (ref 36.0–46.0)
Hemoglobin: 9.9 g/dL — ABNORMAL LOW (ref 12.0–15.0)
Immature Granulocytes: 0 %
Lymphocytes Relative: 64 %
Lymphs Abs: 1.6 10*3/uL (ref 0.7–4.0)
MCH: 30.7 pg (ref 26.0–34.0)
MCHC: 32.9 g/dL (ref 30.0–36.0)
MCV: 93.2 fL (ref 80.0–100.0)
Monocytes Absolute: 0.2 10*3/uL (ref 0.1–1.0)
Monocytes Relative: 7 %
Neutro Abs: 0.6 10*3/uL — ABNORMAL LOW (ref 1.7–7.7)
Neutrophils Relative %: 24 %
Platelet Count: 279 10*3/uL (ref 150–400)
RBC: 3.23 MIL/uL — ABNORMAL LOW (ref 3.87–5.11)
RDW: 14.6 % (ref 11.5–15.5)
WBC Count: 2.4 10*3/uL — ABNORMAL LOW (ref 4.0–10.5)
nRBC: 0 % (ref 0.0–0.2)

## 2020-12-23 LAB — CMP (CANCER CENTER ONLY)
ALT: 33 U/L (ref 0–44)
AST: 36 U/L (ref 15–41)
Albumin: 3.6 g/dL (ref 3.5–5.0)
Alkaline Phosphatase: 208 U/L — ABNORMAL HIGH (ref 38–126)
Anion gap: 10 (ref 5–15)
BUN: 14 mg/dL (ref 6–20)
CO2: 26 mmol/L (ref 22–32)
Calcium: 9.6 mg/dL (ref 8.9–10.3)
Chloride: 103 mmol/L (ref 98–111)
Creatinine: 0.69 mg/dL (ref 0.44–1.00)
GFR, Estimated: >60 mL/min (ref >60)
Glucose, Bld: 163 mg/dL — ABNORMAL HIGH (ref 70–99)
Potassium: 3.7 mmol/L (ref 3.5–5.1)
Sodium: 139 mmol/L (ref 135–145)
Total Bilirubin: 0.2 mg/dL — ABNORMAL LOW (ref 0.3–1.2)
Total Protein: 7.6 g/dL (ref 6.5–8.1)

## 2020-12-23 LAB — PREGNANCY, URINE: Preg Test, Ur: NEGATIVE

## 2020-12-23 LAB — MAGNESIUM: Magnesium: 1.5 mg/dL — ABNORMAL LOW (ref 1.7–2.4)

## 2020-12-23 LAB — TSH: TSH: 1.823 u[IU]/mL (ref 0.308–3.960)

## 2020-12-23 MED ORDER — POTASSIUM CHLORIDE IN NACL 20-0.9 MEQ/L-% IV SOLN
Freq: Once | INTRAVENOUS | Status: AC
  Filled 2020-12-23: qty 1000

## 2020-12-23 MED ORDER — HEPARIN SOD (PORK) LOCK FLUSH 100 UNIT/ML IV SOLN
500.0000 [IU] | Freq: Once | INTRAVENOUS | Status: AC | PRN
  Administered 2020-12-23: 500 [IU]

## 2020-12-23 MED ORDER — SODIUM CHLORIDE 0.9 % IV SOLN
25.0000 mg/m2 | Freq: Once | INTRAVENOUS | Status: AC
  Administered 2020-12-23: 42 mg via INTRAVENOUS
  Filled 2020-12-23: qty 42

## 2020-12-23 MED ORDER — SODIUM CHLORIDE 0.9% FLUSH
10.0000 mL | Freq: Once | INTRAVENOUS | Status: AC
  Administered 2020-12-23: 10 mL

## 2020-12-23 MED ORDER — SODIUM CHLORIDE 0.9 % IV SOLN
800.0000 mg/m2 | Freq: Once | INTRAVENOUS | Status: AC
  Administered 2020-12-23: 1368 mg via INTRAVENOUS
  Filled 2020-12-23: qty 35.98

## 2020-12-23 MED ORDER — SODIUM CHLORIDE 0.9 % IV SOLN
150.0000 mg | Freq: Once | INTRAVENOUS | Status: AC
  Administered 2020-12-23: 150 mg via INTRAVENOUS
  Filled 2020-12-23: qty 150

## 2020-12-23 MED ORDER — SODIUM CHLORIDE 0.9 % IV SOLN: Freq: Once | INTRAVENOUS | Status: AC

## 2020-12-23 MED ORDER — PEGFILGRASTIM 6 MG/0.6ML ~~LOC~~ PSKT
6.0000 mg | PREFILLED_SYRINGE | Freq: Once | SUBCUTANEOUS | Status: AC
  Administered 2020-12-23: 6 mg via SUBCUTANEOUS
  Filled 2020-12-23: qty 0.6

## 2020-12-23 MED ORDER — ACETAMINOPHEN 325 MG PO TABS
650.0000 mg | ORAL_TABLET | Freq: Once | ORAL | Status: AC
  Administered 2020-12-23: 650 mg via ORAL
  Filled 2020-12-23: qty 2

## 2020-12-23 MED ORDER — MAGNESIUM SULFATE 2 GM/50ML IV SOLN
2.0000 g | Freq: Once | INTRAVENOUS | Status: AC
  Administered 2020-12-23: 2 g via INTRAVENOUS
  Filled 2020-12-23: qty 50

## 2020-12-23 MED ORDER — PALONOSETRON HCL INJECTION 0.25 MG/5ML
0.2500 mg | Freq: Once | INTRAVENOUS | Status: AC
  Administered 2020-12-23: 0.25 mg via INTRAVENOUS
  Filled 2020-12-23: qty 5

## 2020-12-23 MED ORDER — SODIUM CHLORIDE 0.9% FLUSH
10.0000 mL | INTRAVENOUS | Status: DC | PRN
  Administered 2020-12-23: 10 mL

## 2020-12-23 NOTE — Progress Notes (Signed)
Per [name] ok to run Post cisplatin fluids concurrently w/ cisplatin today.

## 2020-12-23 NOTE — Progress Notes (Signed)
OK to treat, but [name] will reduce gemzar dose and schedule GCSF

## 2020-12-24 LAB — CANCER ANTIGEN 19-9: CA 19-9: 51 U/mL — ABNORMAL HIGH (ref 0–35)

## 2020-12-24 LAB — T4: T4, Total: 10.1 ug/dL (ref 4.5–12.0)

## 2020-12-27 ENCOUNTER — Other Ambulatory Visit: Payer: Self-pay

## 2020-12-27 ENCOUNTER — Ambulatory Visit (HOSPITAL_COMMUNITY)
Admission: RE | Admit: 2020-12-27 | Discharge: 2020-12-27 | Disposition: A | Discharge status: Home / Self Care | Payer: 59 | Source: Ambulatory Visit | Attending: Hematology | Admitting: Hematology

## 2020-12-27 DIAGNOSIS — C221 Intrahepatic bile duct carcinoma: Secondary | ICD-10-CM

## 2021-01-03 ENCOUNTER — Ambulatory Visit (HOSPITAL_COMMUNITY)
Admission: RE | Admit: 2021-01-03 | Discharge: 2021-01-03 | Disposition: A | Discharge status: Home / Self Care | Payer: 59 | Source: Ambulatory Visit | Attending: Hematology | Admitting: Hematology

## 2021-01-03 ENCOUNTER — Other Ambulatory Visit: Payer: Self-pay | Admitting: Hematology

## 2021-01-03 DIAGNOSIS — C221 Intrahepatic bile duct carcinoma: Secondary | ICD-10-CM

## 2021-01-03 NOTE — Progress Notes (Signed)
Patient was scheduled for MRI Abdomen at [facility] this morning 01/03/2021. patient arrived at 9:20 for her appointment. She also stated that she began drinking contrast at 6:30am and 8:30am which is not for the MRI scan. Patient drank the CT contrast which she should have drank for her appointment last week. Patient will be rescheduled for Saturday 10/1 at 10am, patient is aware to be onsite at 9:30am and to be NPO for exam.

## 2021-01-05 MED FILL — Fosaprepitant Dimeglumine For IV Infusion 150 MG (Base Eq): INTRAVENOUS | Qty: 5 | Status: AC

## 2021-01-06 ENCOUNTER — Other Ambulatory Visit: Payer: Self-pay

## 2021-01-06 ENCOUNTER — Inpatient Hospital Stay: Payer: 59

## 2021-01-06 ENCOUNTER — Inpatient Hospital Stay (HOSPITAL_BASED_OUTPATIENT_CLINIC_OR_DEPARTMENT_OTHER): Payer: 59 | Admitting: Hematology

## 2021-01-06 VITALS — BP 149/69 | HR 95 | Temp 98.1°F | Resp 18 | Ht 66.0 in | Wt 149.6 lb

## 2021-01-06 DIAGNOSIS — C221 Intrahepatic bile duct carcinoma: Secondary | ICD-10-CM | POA: Diagnosis not present

## 2021-01-06 DIAGNOSIS — Z5111 Encounter for antineoplastic chemotherapy: Secondary | ICD-10-CM | POA: Diagnosis not present

## 2021-01-06 DIAGNOSIS — Z95828 Presence of other vascular implants and grafts: Secondary | ICD-10-CM

## 2021-01-06 LAB — CMP (CANCER CENTER ONLY)
ALT: 11 U/L (ref 0–44)
AST: 23 U/L (ref 15–41)
Albumin: 3.6 g/dL (ref 3.5–5.0)
Alkaline Phosphatase: 211 U/L — ABNORMAL HIGH (ref 38–126)
Anion gap: 11 (ref 5–15)
BUN: 11 mg/dL (ref 6–20)
CO2: 25 mmol/L (ref 22–32)
Calcium: 9.3 mg/dL (ref 8.9–10.3)
Chloride: 104 mmol/L (ref 98–111)
Creatinine: 0.73 mg/dL (ref 0.44–1.00)
GFR, Estimated: >60 mL/min
Glucose, Bld: 164 mg/dL — ABNORMAL HIGH (ref 70–99)
Potassium: 3.9 mmol/L (ref 3.5–5.1)
Sodium: 140 mmol/L (ref 135–145)
Total Bilirubin: 0.2 mg/dL — ABNORMAL LOW (ref 0.3–1.2)
Total Protein: 7.4 g/dL (ref 6.5–8.1)

## 2021-01-06 LAB — CBC WITH DIFFERENTIAL (CANCER CENTER ONLY)
Abs Immature Granulocytes: 0.33 10*3/uL — ABNORMAL HIGH (ref 0.00–0.07)
Basophils Absolute: 0 10*3/uL (ref 0.0–0.1)
Basophils Relative: 0 %
Eosinophils Absolute: 0.1 10*3/uL (ref 0.0–0.5)
Eosinophils Relative: 1 %
HCT: 28 % — ABNORMAL LOW (ref 36.0–46.0)
Hemoglobin: 9.2 g/dL — ABNORMAL LOW (ref 12.0–15.0)
Immature Granulocytes: 4 %
Lymphocytes Relative: 22 %
Lymphs Abs: 2 10*3/uL (ref 0.7–4.0)
MCH: 31 pg (ref 26.0–34.0)
MCHC: 32.9 g/dL (ref 30.0–36.0)
MCV: 94.3 fL (ref 80.0–100.0)
Monocytes Absolute: 0.9 10*3/uL (ref 0.1–1.0)
Monocytes Relative: 10 %
Neutro Abs: 5.9 10*3/uL (ref 1.7–7.7)
Neutrophils Relative %: 63 %
Platelet Count: 205 10*3/uL (ref 150–400)
RBC: 2.97 MIL/uL — ABNORMAL LOW (ref 3.87–5.11)
RDW: 17.1 % — ABNORMAL HIGH (ref 11.5–15.5)
WBC Count: 9.3 10*3/uL (ref 4.0–10.5)
nRBC: 0.2 % (ref 0.0–0.2)

## 2021-01-06 LAB — MAGNESIUM: Magnesium: 1.7 mg/dL (ref 1.7–2.4)

## 2021-01-06 LAB — TSH: TSH: 1.966 u[IU]/mL (ref 0.308–3.960)

## 2021-01-06 LAB — PREGNANCY, URINE: Preg Test, Ur: NEGATIVE

## 2021-01-06 MED ORDER — MAGNESIUM SULFATE 2 GM/50ML IV SOLN
2.0000 g | Freq: Once | INTRAVENOUS | Status: AC
  Administered 2021-01-06: 2 g via INTRAVENOUS

## 2021-01-06 MED ORDER — SODIUM CHLORIDE 0.9 % IV SOLN
150.0000 mg | Freq: Once | INTRAVENOUS | Status: AC
  Administered 2021-01-06: 150 mg via INTRAVENOUS
  Filled 2021-01-06: qty 150

## 2021-01-06 MED ORDER — SODIUM CHLORIDE 0.9 % IV SOLN
25.0000 mg/m2 | Freq: Once | INTRAVENOUS | Status: AC
  Administered 2021-01-06: 42 mg via INTRAVENOUS
  Filled 2021-01-06: qty 42

## 2021-01-06 MED ORDER — ACETAMINOPHEN 325 MG PO TABS
650.0000 mg | ORAL_TABLET | Freq: Once | ORAL | Status: AC
  Administered 2021-01-06: 650 mg via ORAL

## 2021-01-06 MED ORDER — SODIUM CHLORIDE 0.9% FLUSH
10.0000 mL | Freq: Once | INTRAVENOUS | Status: AC
  Administered 2021-01-06: 10 mL

## 2021-01-06 MED ORDER — SODIUM CHLORIDE 0.9 % IV SOLN
1000.0000 mg/m2 | Freq: Once | INTRAVENOUS | Status: AC
  Administered 2021-01-06: 1672 mg via INTRAVENOUS
  Filled 2021-01-06: qty 43.97

## 2021-01-06 MED ORDER — PALONOSETRON HCL INJECTION 0.25 MG/5ML
INTRAVENOUS | Status: AC
  Filled 2021-01-06: qty 5

## 2021-01-06 MED ORDER — PALONOSETRON HCL INJECTION 0.25 MG/5ML
0.2500 mg | Freq: Once | INTRAVENOUS | Status: AC
  Administered 2021-01-06: 0.25 mg via INTRAVENOUS

## 2021-01-06 MED ORDER — SODIUM CHLORIDE 0.9 % IV SOLN
1500.0000 mg | Freq: Once | INTRAVENOUS | Status: AC
  Administered 2021-01-06: 1500 mg via INTRAVENOUS
  Filled 2021-01-06: qty 30

## 2021-01-06 MED ORDER — POTASSIUM CHLORIDE IN NACL 20-0.9 MEQ/L-% IV SOLN
Freq: Once | INTRAVENOUS | Status: AC
Start: 2021-01-06 — End: 2021-01-06
  Filled 2021-01-06: qty 1000

## 2021-01-06 MED ORDER — SODIUM CHLORIDE 0.9 % IV SOLN: Freq: Once | INTRAVENOUS | Status: AC

## 2021-01-06 MED ORDER — SODIUM CHLORIDE 0.9% FLUSH
10.0000 mL | INTRAVENOUS | Status: DC | PRN
  Administered 2021-01-06: 10 mL

## 2021-01-06 MED ORDER — HEPARIN SOD (PORK) LOCK FLUSH 100 UNIT/ML IV SOLN
500.0000 [IU] | Freq: Once | INTRAVENOUS | Status: AC | PRN
  Administered 2021-01-06: 500 [IU]

## 2021-01-06 MED ORDER — MAGNESIUM SULFATE 2 GM/50ML IV SOLN
INTRAVENOUS | Status: AC
  Filled 2021-01-06: qty 50

## 2021-01-06 MED ORDER — ACETAMINOPHEN 325 MG PO TABS
ORAL_TABLET | ORAL | Status: AC
  Filled 2021-01-06: qty 2

## 2021-01-06 NOTE — Patient Instructions (Signed)
[facility]  Discharge Instructions: Thank you for choosing [facility] to provide your oncology and hematology care.   If you have a lab appointment with the [facility], please go directly to the [facility] and check in at the registration area.   Wear comfortable clothing and clothing appropriate for easy access to any Portacath or PICC line.   We strive to give you quality time with your provider. You may need to reschedule your appointment if you arrive late (15 or more minutes).  Arriving late affects you and other patients whose appointments are after yours.  Also, if you miss three or more appointments without notifying the office, you may be dismissed from the clinic at the provider's discretion.      For prescription refill requests, have your pharmacy contact our office and allow 72 hours for refills to be completed.    Today you received the following chemotherapy and/or immunotherapy agents: Durvalumab (Imfinzi), Cisplatin, and Gemcitabine (Gemzar).   To help prevent nausea and vomiting after your treatment, we encourage you to take your nausea medication as directed.  BELOW ARE SYMPTOMS THAT SHOULD BE REPORTED IMMEDIATELY: *FEVER GREATER THAN 100.4 F (38 C) OR HIGHER *CHILLS OR SWEATING *NAUSEA AND VOMITING THAT IS NOT CONTROLLED WITH YOUR NAUSEA MEDICATION *UNUSUAL SHORTNESS OF BREATH *UNUSUAL BRUISING OR BLEEDING *URINARY PROBLEMS (pain or burning when urinating, or frequent urination) *BOWEL PROBLEMS (unusual diarrhea, constipation, pain near the anus) TENDERNESS IN MOUTH AND THROAT WITH OR WITHOUT PRESENCE OF ULCERS (sore throat, sores in mouth, or a toothache) UNUSUAL RASH, SWELLING OR PAIN  UNUSUAL VAGINAL DISCHARGE OR ITCHING   Items with * indicate a potential emergency and should be followed up as soon as possible or go to the Emergency Department if any problems should occur.  Please show the CHEMOTHERAPY ALERT  CARD or IMMUNOTHERAPY ALERT CARD at check-in to the Emergency Department and triage nurse.  Should you have questions after your visit or need to cancel or reschedule your appointment, please contact [facility]  Dept: [phone]  and follow the prompts.  Office hours are 8:00 a.m. to 4:30 p.m. Monday - Friday. Please note that voicemails left after 4:00 p.m. may not be returned until the following business day.  We are closed weekends and major holidays. You have access to a nurse at all times for urgent questions. Please call the main number to the clinic Dept: [phone] and follow the prompts.   For any non-urgent questions, you may also contact your provider using MyChart. We now offer e-Visits for anyone 71 and older to request care online for non-urgent symptoms. For details visit [email or web address].   Also download the MyChart app! Go to the app store, search "MyChart", open the app, select [facility], and log in with your MyChart username and password.  Due to Covid, a mask is required upon entering the hospital/clinic. If you do not have a mask, one will be given to you upon arrival. For doctor visits, patients may have 1 support person aged 43 or older with them. For treatment visits, patients cannot have anyone with them due to current Covid guidelines and our immunocompromised population.

## 2021-01-06 NOTE — Addendum Note (Signed)
Addended by: [name] on: 01/06/2021 10:06 AM   Modules accepted: Orders

## 2021-01-06 NOTE — Progress Notes (Signed)
Patient had one episode of emesis during [facility] infusion. [name] notified and gave verbal order for Compazine 10mg  PO or IV if nausea didn't subside in 10-20 minutes. Compazine not needed.

## 2021-01-06 NOTE — Progress Notes (Addendum)
[facility]   Telephone:(336) [phone] Fax:(336) [phone]   Clinic Follow up Note   Patient Care Team: [name], MD as PCP - General (Family Medicine) [name], MD as Consulting Physician (Oncology) [name], RN (Inactive) as Oncology Nurse Navigator  Date of Service:  01/06/2021  CHIEF COMPLAINT: f/u of intrahepatic cholangiocarcinoma  CURRENT THERAPY:  First line gemcitabine and cisplatin 2 weeks on/1 week off starting 08/16/20 -Addition of Durvalumab q3weeks starting 08/16/20  ASSESSMENT & PLAN:  Crystal Gibbs is a 45 y.o. female with   1. 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 -Her Liver biopsy from 07/26/20 showed adenocarcinoma. There is possibility her primary cancer is elsewhere, although imaging did not show other malignancy outside of liver. -Her 07/10/20 MRI abdomen also shows there is at least 1 satellite lesion centrally in the right lobe and her 07/30/20 CT Chest shows Pulmonary nodules are highly worrisome for metastatic disease.  -Her Upper endoscopy by Dr [name] on 08/18/20 did not show malignancy.  -I started her on chemo with First line gemcitabine and cisplatin 2 weeks on/1 week off in addition to Durvalumab q3weeks based on recent TOPAZ1 trial data. She started on 08/16/20.  -Her 11/04/20 CT CAP showed overall stable disease with mild increase of of her primary tumor and satellite lesion, but still in the range of stable disease.   -She continues to tolerate treatment relatively well. She feels she is slowly getting better. There was a miscommunication about her scans and the contrast, so her MRI has been delayed to 01/08/21. -Her CT chest from December 27, 2020 showed stable small lung nodules, some slightly improved, no new  lesions. -Lab reviewed, adequate for treatment, will proceed chemo and durvalumab today. -labs and f/u in 3 weeks. They prefer Thursday so the patient's niece can attend with her.   2. Abdominal Pain, Hepatomegaly, acid reflux -secondary to #1 -She had abdominal pain 2 months before diagnosis. The pain is not daily (mostly with eating and prolonged sitting) but discomfort is mostly daily. -Pain started while she was in [place] and work up was concerning for malignancy. She chose to return to the [place] for more workup.  -Pain has much improved since she started chemo, continue monitoring and tramadol as needed. She has only needed occasionally. She also uses Tylenol. -continue protonix    3. Right shoulder and arm pain -She notes right shoulder pain that radiates down arm. She notes this is not constant and can fluctuate in severity, between 5-8/10. -She has Tramadol for her pain, she can continue or Tylenol/Ibuprofen.  -CT scan was negative for chest wall mass or bone lesions, her pain could be muscular pain in nature   5. Social and Acupuncturist -She lives in [place] with her husband, although she was recently frequently in [place]. She is a [place] Citizen. -She and her husband does not speak much [place]. They require interpretor, one of whom they listed as a point of contact. They have a niece that lives nearby who speaks [place]. -She is not working. He has job, but not currently working, as to help his wife. -She has [employer] health insurance. She does not have a PCP currently in the [place]. -Continue f/u with SW and financial advocate for support and to possibly apply for Medicare/Medicaid.  6. Genetic testing -Given her age and family history of GI cancer in her mother, she is eligible for genetic testing.  -Results negative with VUS in SDHA and RECQL4.   7. Indeterminate lung nodules -initially seen on chest CT 07/30/20, most are 46m or less  -increased size of medial right lung base  nodule on 11/04/20 scan, other nodules stable. -stable to decrease in size of nodules on 12/27/20 CT.     PLAN:  -Lab reviewed, adequate for treatment, will proceed C6 Cis/gem today and next week, durvalumab today, Onpro on day 8  -proceed with MRI on 01/08/21. We will call her with results  -labs and f/u in 3 weeks (on Thursdays, so her niece can come with her) -Her niece notes [facility] only needs an interpreter on normal infusion days. When she has an office visit, she will have her niece present and translate for her    No problem-specific Assessment & Plan notes found for this encounter.   SUMMARY OF ONCOLOGIC HISTORY: Oncology History Overview Note  Cancer Staging Intrahepatic cholangiocarcinoma ([place] Staging form: Intrahepatic Bile Duct, AJCC 8th Edition - Clinical: Stage IB (cT1b, cN0, cM0) - Signed by FTruitt Merle MD on 08/04/2020    Intrahepatic cholangiocarcinoma ([facility]  08/19/2019 Procedure   Upper Endoscopy by Dr CBryan Lemma IMPRESSION - Normal esophagus. - Z-line regular, 35 cm from the incisors. - Gastritis. Biopsied. - Normal incisura, antrum and pylorus. - Erythematous duodenopathy. Biopsied. - Normal second portion of the duodenum and third portion of the duodenum. - No evidence of primary malignancy site noted on this study.   07/10/2020 Imaging   [place] 07/10/20 IMPRESSION: 1.  No acute hepatobiliary findings.   2. Heterogeneous liver echotexture with 3 masslike areas as described measuring 4.3 cm, 5.4 cm and 6.4 cm respectively. Recommend CT abdomen/pelvis with intravenous contrast for further evaluation.   07/10/2020 Imaging   CT AP 07/10/20  IMPRESSION: 1. There is a large masslike collection or mass in the central liver measuring at least 10.9 x 7.1 x 10.2 cm with 2 smaller adjacent satellite lesions/collections measuring 11 and 17 mm. These findings may represent a large abscess with satellite abscesses or a large neoplasm/malignancy. Recommend MRI for further  evaluation. 2. Left renal cyst. 3. Calcified atherosclerosis in the distal abdominal aorta is mild. 4. Fibroid uterus.   07/10/2020 Imaging   MRI Abdomen 07/10/20  IMPRESSION: 1. The large central hepatic mass does not demonstrate signal characteristics or enhancement typical of a hemangioma or abscess, and is suspicious for malignancy. There is at least 1 satellite lesion centrally in the right lobe. As there are no signs of underlying cirrhosis, primary considerations include peripheral cholangiocarcinoma and metastatic disease. Tissue sampling recommended. 2. Mild intrahepatic biliary dilatation within the left hepatic lobe. Distortion of the hepatic vasculature without evidence of tumor thrombus. 3. No extrahepatic metastatic disease or primary malignancy identified within the abdomen.   07/26/2020 Imaging   CT AP 07/26/20  IMPRESSION: 1. Markedly poorly visualized and ill-defined 12 cm lesion within the liver. This lesion is better evaluated on MR abdomen 07/10/2020. 2. Otherwise no acute intra-abdominal abnormality with markedly limited evaluation on this noncontrast study. 3.  Aortic Atherosclerosis (ICD10-I70.0).   07/26/2020 Initial Diagnosis   FINAL MICROSCOPIC DIAGNOSIS: 07/26/20  A. LIVER MASS, LEFT, NEEDLE CORE BIOPSY:  - Adenocarcinoma.   ADDENDUM: The adenocarcinoma is positive with immunohistochemistry for cytokeratin 7 and PAX 8.  The carcinoma is negative with cytokeratin 20, CDX 2, estrogen receptor, GATA3, GCDFP, monoclonal  and polyclonal CEA, cytokeratin 5/6, TTF-1, Napsin A, HepPar1, arginase 1 and WT1.  The morphology and immunophenotype are consistent with adenocarcinoma.  The differential for possible primary sites is broad including kidney although the morphology is not typical for more common types of renal cell carcinoma.  Pancreaticobiliary and gynecologic cannot be ruled out.    07/30/2020 Imaging   CT Chest 07/30/20  IMPRESSION: 1. Pulmonary nodules are  highly worrisome for metastatic disease. 2. Hepatic masses are consistent with biopsy-proven adenocarcinoma and better evaluated on MR abdomen 07/10/2020.   08/04/2020 Initial Diagnosis   Intrahepatic cholangiocarcinoma ([facility])   08/04/2020 Cancer Staging   Staging form: Intrahepatic Bile Duct, AJCC 8th Edition - Clinical: Stage IB (cT1b, cN0, cM0) - Signed by [name], MD on 08/04/2020   08/13/2020 Procedure   PAC placement   08/16/2020 -  Chemotherapy   First line gemcitabine and cisplatin 2 weeks on/1 week off starting 08/16/20     08/16/2020 -  Chemotherapy   -Addition of durvalumab q3weeks with first-line chemo starting 08/16/20    08/18/2020 Procedure   Upper Endoscopy by Dr [name] IMPRESSION - Normal esophagus. - Z-line regular, 35 cm from the incisors. - Gastritis. Biopsied. - Normal incisura, antrum and pylorus. - Erythematous duodenopathy. Biopsied. - Normal second portion of the duodenum and third portion of the duodenum. - No evidence of primary malignancy site noted on this study.   08/18/2020 Pathology Results   FINAL MICROSCOPIC DIAGNOSIS:   A. DUODENUM, BULB, BIOPSY:  -  Erosive and reactive duodenal mucosa with Brunner's gland hyperplasia and mixed inflammation  -  No dysplasia or malignancy identified   B. STOMACH, BIOPSY:  -  Chronic active gastritis  -  H. pylori organisms present  -  No intestinal metaplasia identified  -  See comment   COMMENT:  Warthin-Starry stain is POSITIVE for organisms consistent with  Helicobacter pylori.    09/08/2020 Genetic Testing   Negative hereditary cancer genetic testing: no pathogenic variants detected in [facility] Multi-Cancer Panel + Pancreatitis Genes.  Variants of uncertain significance detected in RECQL4 at c.119-20C>A (Intronic) and SDHA at c.1039A>G (p.Met347Val).  The report date is September 08, 2020.    The Multi-Cancer Panel with pancreatitis genes offered by [facility] includes sequencing and/or deletion duplication  testing of the following 89 genes: AIP, ALK, APC, ATM, AXIN2,BAP1,  BARD1, BLM, BMPR1A, BRCA1, BRCA2, BRIP1, CASR, CDC73, CDH1, CDK4, CDKN1B, CDKN1C, CDKN2A (p14ARF), CDKN2A (p16INK4a), CEBPA, CFTR, CHEK2, CPA1, CTNNA1, CTRC, DICER1, DIS3L2, EGFR (c.2369C>T, p.Thr790Met variant only), EPCAM (Deletion/duplication testing only), FH, FLCN, GATA2, GPC3, GREM1 (Promoter region deletion/duplication testing only), HOXB13 (c.251G>A, p.Gly84Glu), HRAS, KIT, MAX, MEN1, MET, MITF (c.952G>A, p.Glu318Lys variant only), MLH1, MSH2, MSH3, MSH6, MUTYH, NBN, NF1, NF2, NTHL1, PALB2, PDGFRA, PHOX2B, PMS2, POLD1, POLE, POT1, PRKAR1A, PRSS1, PTCH1, PTEN, RAD50, RAD51C, RAD51D, RB1, RECQL4, RET, RNF43, RUNX1, SDHAF2, SDHA (sequence changes only), SDHB, SDHC, SDHD, SMAD4, SMARCA4, SMARCB1, SMARCE1, SPINK1, STK11, SUFU, TERC, TERT, TMEM127, TP53, TSC1, TSC2, VHL, WRN and WT1.    11/04/2020 Imaging   CT CAP  IMPRESSION: 1. Redemonstrated large, hypoenhancing mass of the central right lobe of the liver, which is stable or perhaps slightly enlarged compared to prior examination, measuring 11.5 x 7.0 cm, previously 10.9 x 7.1 cm when measured similarly. Small satellite lesions appear to have enlarged in the interval and there is new tumor involvement of the caudate, overall constellation of findings consistent with worsened cholangiocarcinoma. 2. There is segmental biliary ductal dilatation, primarily seen in the left lobe of  the liver, not significantly changed compared to prior examination. The portal veins remain patent. 3. Interval enlargement of a nodule of the medial right lung base, consistent with worsened pulmonary metastatic disease. Additional small nodules are essentially stable and although technically nonspecific remain worrisome for additional small metastases. 4. Aortic atherosclerosis, advanced for patient age and gender. 5. Uterine fibroids.   12/27/2020 Imaging   CT Chest w/o  contrast  IMPRESSION: Solid pulmonary nodules again seen, most are decreased in size compared to prior, a few are stable.   Hypoattenuating mass in the central right lobe of the liver appear similar to prior exam, although evaluation is limited due to lack of IV contrast.   Aortic Atherosclerosis (ICD10-I70.0).      INTERVAL HISTORY:  Ariell Gunnels is here for a follow up of cholangiocarcinoma. She was last seen by me on 12/16/20. She presents to the clinic accompanied by her niece. She reports she is feeling better-- her energy level is better, she is able to stand still longer now. She notes she was not able to walk as much following this cycle because of bone pain from the growth factor. She notes the pain was intermittent and lasted for about 4 days. She also reports occasional right abdomen pain that will not last very long; this is in the location of her cancer, which she is aware of. She denies any bowel issues, except related to what she eats or doesn't eat. She notes reflux/heartburn related to eating fruit.   All other systems were reviewed with the patient and are negative.  MEDICAL HISTORY:  Past Medical History:  Diagnosis Date   Cancer [facility])    Family history of breast cancer 08/23/2020   Family history of pancreatic cancer 08/23/2020   Family history of prostate cancer 08/23/2020   Family history of stomach cancer 08/23/2020   Fibroid    History of multiple miscarriages    x 3   Hypertension     SURGICAL HISTORY: Past Surgical History:  Procedure Laterality Date   BIOPSY  08/18/2020   Procedure: BIOPSY;  Surgeon: Lavena Bullion, DO;  Location: [facility];  Service: Gastroenterology;;   CATARACT EXTRACTION Bilateral    ESOPHAGOGASTRODUODENOSCOPY (EGD) WITH PROPOFOL N/A 08/18/2020   Procedure: ESOPHAGOGASTRODUODENOSCOPY (EGD) WITH PROPOFOL;  Surgeon: Lavena Bullion, DO;  Location: [facility];  Service: Gastroenterology;  Laterality: N/A;   IR IMAGING GUIDED  PORT INSERTION  08/13/2020   PARATHYROIDECTOMY Right 02/19/15    I have reviewed the social history and family history with the patient and they are unchanged from previous note.  ALLERGIES:  has No Known Allergies.  MEDICATIONS:  Current Outpatient Medications  Medication Sig Dispense Refill   amLODipine (NORVASC) 2.5 MG tablet Take 1 tablet (2.5 mg total) by mouth daily. 90 tablet 1   dexamethasone (DECADRON) 4 MG tablet Take 2 tablets at breakfast for 3 days, starting the day after cisplatin 40 tablet 2   diclofenac Sodium (VOLTAREN) 1 % GEL Apply 4 g topically 4 (four) times daily. 50 g 2   lidocaine-prilocaine (EMLA) cream Apply 1 application topically daily as needed (port access). 30 g 2   magnesium oxide (MAG-OX) 400 (240 Mg) MG tablet Take 1 tablet (400 mg total) by mouth daily. 30 tablet 3   ondansetron (ZOFRAN ODT) 4 MG disintegrating tablet Take 1-2 tablets (4-8 mg total) by mouth every 6 (six) hours as needed for nausea or vomiting. 30 tablet 1   ondansetron (ZOFRAN) 8 MG tablet Take 1  tablet (8 mg total) by mouth 2 (two) times daily as needed. Start on the third day after cisplatin chemotherapy. 30 tablet 1   pantoprazole (PROTONIX) 40 MG tablet Take 1 tablet (40 mg total) by mouth daily. 30 tablet 3   polyethylene glycol powder (GLYCOLAX/MIRALAX) 17 GM/SCOOP powder Take 17 g by mouth 2 (two) times daily as needed. 3350 g 1   prochlorperazine (COMPAZINE) 10 MG tablet Take 1 tablet (10 mg total) by mouth every 6 (six) hours as needed (Nausea or vomiting). 30 tablet 1   No current facility-administered medications for this visit.   Facility-Administered Medications Ordered in Other Visits  Medication Dose Route Frequency Provider Last Rate Last Admin   0.9 % NaCl with KCl 20 mEq/ L  infusion   Intravenous Once [name], MD       acetaminophen (TYLENOL) 325 MG tablet            heparin lock flush 100 unit/mL  500 Units Intracatheter Once PRN [name], MD       magnesium  sulfate 2 GM/50ML IVPB            magnesium sulfate 2 GM/50ML IVPB            magnesium sulfate IVPB 2 g 50 mL  2 g Intravenous Once [name], MD       palonosetron (ALOXI) 0.25 MG/5ML injection            sodium chloride flush (NS) 0.9 % injection 10 mL  10 mL Intracatheter PRN [name], MD        PHYSICAL EXAMINATION: ECOG PERFORMANCE STATUS: 2 - Symptomatic, <50% confined to bed  Vitals:   01/06/21 0914  BP: (!) 149/69  Pulse: 95  Resp: 18  Temp: 98.1 F (36.7 C)  SpO2: 100%   Wt Readings from Last 3 Encounters:  01/06/21 149 lb 9.6 oz (67.9 kg)  12/23/20 148 lb (67.1 kg)  12/16/20 147 lb 4.8 oz (66.8 kg)     GENERAL:alert, no distress and comfortable SKIN: skin color normal, no rashes or significant lesions EYES: normal, Conjunctiva are pink and non-injected, sclera clear  NEURO: alert & oriented x 3 with fluent speech  LABORATORY DATA:  I have reviewed the data as listed CBC Latest Ref Rng & Units 01/06/2021 12/23/2020 12/16/2020  WBC 4.0 - 10.5 K/uL 9.3 2.4(L) 4.6  Hemoglobin 12.0 - 15.0 g/dL 9.2(L) 9.9(L) 9.8(L)  Hematocrit 36.0 - 46.0 % 28.0(L) 30.1(L) 29.7(L)  Platelets 150 - 400 K/uL 205 279 199     CMP Latest Ref Rng & Units 01/06/2021 12/23/2020 12/16/2020  Glucose 70 - 99 mg/dL 164(H) 163(H) 162(H)  BUN 6 - 20 mg/dL 11 14 12   Creatinine 0.44 - 1.00 mg/dL 0.73 0.69 0.76  Sodium 135 - 145 mmol/L 140 139 138  Potassium 3.5 - 5.1 mmol/L 3.9 3.7 3.7  Chloride 98 - 111 mmol/L 104 103 102  CO2 22 - 32 mmol/L 25 26 25   Calcium 8.9 - 10.3 mg/dL 9.3 9.6 9.7  Total Protein 6.5 - 8.1 g/dL 7.4 7.6 7.6  Total Bilirubin 0.3 - 1.2 mg/dL 0.2(L) 0.2(L) 0.3  Alkaline Phos 38 - 126 U/L 211(H) 208(H) 183(H)  AST 15 - 41 U/L 23 36 26  ALT 0 - 44 U/L 11 33 12      RADIOGRAPHIC STUDIES: I have personally reviewed the radiological images as listed and agreed with the findings in the report. No results found.    No orders of  the defined types were placed in this  encounter.  All questions were answered. The patient knows to call the clinic with any problems, questions or concerns. No barriers to learning was detected. The total time spent in the appointment was 30 minutes.     [name], MD 01/06/2021   I, Wilburn Mylar, am acting as scribe for [name], MD.   I have reviewed the above documentation for accuracy and completeness, and I agree with the above.

## 2021-01-07 LAB — T4: T4, Total: 9.4 ug/dL (ref 4.5–12.0)

## 2021-01-07 LAB — CANCER ANTIGEN 19-9: CA 19-9: 86 U/mL — ABNORMAL HIGH (ref 0–35)

## 2021-01-08 ENCOUNTER — Ambulatory Visit (HOSPITAL_COMMUNITY)
Admission: RE | Admit: 2021-01-08 | Discharge: 2021-01-08 | Disposition: A | Discharge status: Home / Self Care | Payer: 59 | Source: Ambulatory Visit | Attending: Hematology | Admitting: Hematology

## 2021-01-08 DIAGNOSIS — C221 Intrahepatic bile duct carcinoma: Secondary | ICD-10-CM | POA: Insufficient documentation

## 2021-01-08 DIAGNOSIS — G893 Neoplasm related pain (acute) (chronic): Secondary | ICD-10-CM | POA: Insufficient documentation

## 2021-01-08 DIAGNOSIS — Z5111 Encounter for antineoplastic chemotherapy: Secondary | ICD-10-CM | POA: Insufficient documentation

## 2021-01-08 MED ORDER — GADOBUTROL 1 MMOL/ML IV SOLN
7.0000 mL | Freq: Once | INTRAVENOUS | Status: AC | PRN
  Administered 2021-01-08: 7 mL via INTRAVENOUS

## 2021-01-11 ENCOUNTER — Telehealth: Payer: Self-pay | Admitting: Hematology

## 2021-01-11 NOTE — Telephone Encounter (Signed)
Left message with rescheduled upcoming appointment per [facility].

## 2021-01-12 MED FILL — Fosaprepitant Dimeglumine For IV Infusion 150 MG (Base Eq): INTRAVENOUS | Qty: 5 | Status: AC

## 2021-01-13 ENCOUNTER — Inpatient Hospital Stay: Payer: 59

## 2021-01-13 ENCOUNTER — Other Ambulatory Visit: Payer: Self-pay

## 2021-01-13 ENCOUNTER — Inpatient Hospital Stay: Payer: 59 | Attending: Hematology

## 2021-01-13 VITALS — BP 143/89 | HR 92 | Temp 98.3°F | Resp 18 | Wt 150.1 lb

## 2021-01-13 DIAGNOSIS — Z5111 Encounter for antineoplastic chemotherapy: Secondary | ICD-10-CM | POA: Diagnosis not present

## 2021-01-13 DIAGNOSIS — C221 Intrahepatic bile duct carcinoma: Secondary | ICD-10-CM

## 2021-01-13 DIAGNOSIS — Z95828 Presence of other vascular implants and grafts: Secondary | ICD-10-CM

## 2021-01-13 DIAGNOSIS — G893 Neoplasm related pain (acute) (chronic): Secondary | ICD-10-CM | POA: Diagnosis not present

## 2021-01-13 LAB — CBC WITH DIFFERENTIAL (CANCER CENTER ONLY)
Abs Immature Granulocytes: 0.01 10*3/uL (ref 0.00–0.07)
Basophils Absolute: 0 10*3/uL (ref 0.0–0.1)
Basophils Relative: 1 %
Eosinophils Absolute: 0.1 10*3/uL (ref 0.0–0.5)
Eosinophils Relative: 1 %
HCT: 26 % — ABNORMAL LOW (ref 36.0–46.0)
Hemoglobin: 8.5 g/dL — ABNORMAL LOW (ref 12.0–15.0)
Immature Granulocytes: 0 %
Lymphocytes Relative: 40 %
Lymphs Abs: 1.5 10*3/uL (ref 0.7–4.0)
MCH: 30.5 pg (ref 26.0–34.0)
MCHC: 32.7 g/dL (ref 30.0–36.0)
MCV: 93.2 fL (ref 80.0–100.0)
Monocytes Absolute: 0.3 10*3/uL (ref 0.1–1.0)
Monocytes Relative: 9 %
Neutro Abs: 1.8 10*3/uL (ref 1.7–7.7)
Neutrophils Relative %: 49 %
Platelet Count: 227 10*3/uL (ref 150–400)
RBC: 2.79 MIL/uL — ABNORMAL LOW (ref 3.87–5.11)
RDW: 15.9 % — ABNORMAL HIGH (ref 11.5–15.5)
WBC Count: 3.7 10*3/uL — ABNORMAL LOW (ref 4.0–10.5)
nRBC: 0 % (ref 0.0–0.2)

## 2021-01-13 LAB — CMP (CANCER CENTER ONLY)
ALT: 24 U/L (ref 0–44)
AST: 30 U/L (ref 15–41)
Albumin: 3.5 g/dL (ref 3.5–5.0)
Alkaline Phosphatase: 188 U/L — ABNORMAL HIGH (ref 38–126)
Anion gap: 9 (ref 5–15)
BUN: 16 mg/dL (ref 6–20)
CO2: 26 mmol/L (ref 22–32)
Calcium: 9.1 mg/dL (ref 8.9–10.3)
Chloride: 104 mmol/L (ref 98–111)
Creatinine: 0.71 mg/dL (ref 0.44–1.00)
GFR, Estimated: >60 mL/min (ref >60)
Glucose, Bld: 174 mg/dL — ABNORMAL HIGH (ref 70–99)
Potassium: 4 mmol/L (ref 3.5–5.1)
Sodium: 139 mmol/L (ref 135–145)
Total Bilirubin: <0.2 mg/dL — ABNORMAL LOW (ref 0.3–1.2)
Total Protein: 7.2 g/dL (ref 6.5–8.1)

## 2021-01-13 LAB — MAGNESIUM: Magnesium: 1.7 mg/dL (ref 1.7–2.4)

## 2021-01-13 LAB — TSH: TSH: 2.171 u[IU]/mL (ref 0.308–3.960)

## 2021-01-13 LAB — PREGNANCY, URINE: Preg Test, Ur: NEGATIVE

## 2021-01-13 MED ORDER — SODIUM CHLORIDE 0.9 % IV SOLN
Freq: Once | INTRAVENOUS | Status: AC
Start: 2021-01-13 — End: 2021-01-13

## 2021-01-13 MED ORDER — PALONOSETRON HCL INJECTION 0.25 MG/5ML
0.2500 mg | Freq: Once | INTRAVENOUS | Status: AC
  Administered 2021-01-13: 0.25 mg via INTRAVENOUS
  Filled 2021-01-13: qty 5

## 2021-01-13 MED ORDER — SODIUM CHLORIDE 0.9 % IV SOLN
25.0000 mg/m2 | Freq: Once | INTRAVENOUS | Status: AC
  Administered 2021-01-13: 42 mg via INTRAVENOUS
  Filled 2021-01-13: qty 42

## 2021-01-13 MED ORDER — ACETAMINOPHEN 325 MG PO TABS
650.0000 mg | ORAL_TABLET | Freq: Once | ORAL | Status: AC
Start: 2021-01-13 — End: 2021-01-13
  Administered 2021-01-13: 650 mg via ORAL
  Filled 2021-01-13: qty 2

## 2021-01-13 MED ORDER — SODIUM CHLORIDE 0.9 % IV SOLN
150.0000 mg | Freq: Once | INTRAVENOUS | Status: AC
  Administered 2021-01-13: 150 mg via INTRAVENOUS
  Filled 2021-01-13: qty 150

## 2021-01-13 MED ORDER — PEGFILGRASTIM 6 MG/0.6ML ~~LOC~~ PSKT
6.0000 mg | PREFILLED_SYRINGE | Freq: Once | SUBCUTANEOUS | Status: AC
  Administered 2021-01-13: 6 mg via SUBCUTANEOUS
  Filled 2021-01-13: qty 0.6

## 2021-01-13 MED ORDER — POTASSIUM CHLORIDE IN NACL 20-0.9 MEQ/L-% IV SOLN
Freq: Once | INTRAVENOUS | Status: AC
  Filled 2021-01-13: qty 1000

## 2021-01-13 MED ORDER — HEPARIN SOD (PORK) LOCK FLUSH 100 UNIT/ML IV SOLN
500.0000 [IU] | Freq: Once | INTRAVENOUS | Status: AC | PRN
  Administered 2021-01-13: 500 [IU]

## 2021-01-13 MED ORDER — GEMCITABINE HCL CHEMO INJECTION 1 GM/26.3ML
1000.0000 mg/m2 | Freq: Once | INTRAVENOUS | Status: AC
  Administered 2021-01-13: 1672 mg via INTRAVENOUS
  Filled 2021-01-13: qty 43.97

## 2021-01-13 MED ORDER — MAGNESIUM SULFATE 2 GM/50ML IV SOLN
2.0000 g | Freq: Once | INTRAVENOUS | Status: AC
  Administered 2021-01-13: 2 g via INTRAVENOUS
  Filled 2021-01-13: qty 50

## 2021-01-13 MED ORDER — SODIUM CHLORIDE 0.9% FLUSH
10.0000 mL | INTRAVENOUS | Status: DC | PRN
  Administered 2021-01-13: 10 mL

## 2021-01-13 MED ORDER — SODIUM CHLORIDE 0.9% FLUSH
10.0000 mL | Freq: Once | INTRAVENOUS | Status: AC
  Administered 2021-01-13: 10 mL

## 2021-01-13 NOTE — Patient Instructions (Signed)
[facility]  Discharge Instructions: Thank you for choosing [facility] to provide your oncology and hematology care.   If you have a lab appointment with the [facility], please go directly to the [facility] and check in at the registration area.   Wear comfortable clothing and clothing appropriate for easy access to any Portacath or PICC line.   We strive to give you quality time with your provider. You may need to reschedule your appointment if you arrive late (15 or more minutes).  Arriving late affects you and other patients whose appointments are after yours.  Also, if you miss three or more appointments without notifying the office, you may be dismissed from the clinic at the provider's discretion.      For prescription refill requests, have your pharmacy contact our office and allow 72 hours for refills to be completed.    Today you received the following chemotherapy and/or immunotherapy agents gemcitabine, cisplatin      To help prevent nausea and vomiting after your treatment, we encourage you to take your nausea medication as directed.  BELOW ARE SYMPTOMS THAT SHOULD BE REPORTED IMMEDIATELY: *FEVER GREATER THAN 100.4 F (38 C) OR HIGHER *CHILLS OR SWEATING *NAUSEA AND VOMITING THAT IS NOT CONTROLLED WITH YOUR NAUSEA MEDICATION *UNUSUAL SHORTNESS OF BREATH *UNUSUAL BRUISING OR BLEEDING *URINARY PROBLEMS (pain or burning when urinating, or frequent urination) *BOWEL PROBLEMS (unusual diarrhea, constipation, pain near the anus) TENDERNESS IN MOUTH AND THROAT WITH OR WITHOUT PRESENCE OF ULCERS (sore throat, sores in mouth, or a toothache) UNUSUAL RASH, SWELLING OR PAIN  UNUSUAL VAGINAL DISCHARGE OR ITCHING   Items with * indicate a potential emergency and should be followed up as soon as possible or go to the Emergency Department if any problems should occur.  Please show the CHEMOTHERAPY ALERT CARD or IMMUNOTHERAPY ALERT CARD at  check-in to the Emergency Department and triage nurse.  Should you have questions after your visit or need to cancel or reschedule your appointment, please contact [facility]  Dept: [phone]  and follow the prompts.  Office hours are 8:00 a.m. to 4:30 p.m. Monday - Friday. Please note that voicemails left after 4:00 p.m. may not be returned until the following business day.  We are closed weekends and major holidays. You have access to a nurse at all times for urgent questions. Please call the main number to the clinic Dept: [phone] and follow the prompts.   For any non-urgent questions, you may also contact your provider using MyChart. We now offer e-Visits for anyone 17 and older to request care online for non-urgent symptoms. For details visit [email or web address].   Also download the MyChart app! Go to the app store, search "MyChart", open the app, select [facility], and log in with your MyChart username and password.  Due to Covid, a mask is required upon entering the hospital/clinic. If you do not have a mask, one will be given to you upon arrival. For doctor visits, patients may have 1 support person aged 50 or older with them. For treatment visits, patients cannot have anyone with them due to current Covid guidelines and our immunocompromised population.

## 2021-01-14 LAB — CANCER ANTIGEN 19-9: CA 19-9: 84 U/mL — ABNORMAL HIGH (ref 0–35)

## 2021-01-14 LAB — T4: T4, Total: 9.7 ug/dL (ref 4.5–12.0)

## 2021-01-18 ENCOUNTER — Other Ambulatory Visit: Payer: Self-pay

## 2021-01-18 ENCOUNTER — Inpatient Hospital Stay (HOSPITAL_BASED_OUTPATIENT_CLINIC_OR_DEPARTMENT_OTHER): Payer: 59 | Admitting: Hematology

## 2021-01-18 ENCOUNTER — Encounter: Payer: Self-pay | Admitting: Hematology

## 2021-01-18 VITALS — BP 136/94 | HR 117 | Temp 98.4°F | Resp 18 | Ht 66.0 in | Wt 152.2 lb

## 2021-01-18 DIAGNOSIS — C221 Intrahepatic bile duct carcinoma: Secondary | ICD-10-CM | POA: Diagnosis not present

## 2021-01-18 DIAGNOSIS — Z5111 Encounter for antineoplastic chemotherapy: Secondary | ICD-10-CM | POA: Diagnosis not present

## 2021-01-18 NOTE — Progress Notes (Signed)
DISCONTINUE OFF PATHWAY REGIMEN - Other   [id number]:Cisplatin 25 mg/m2 D1,8 + Gemcitabine 1,000 mg/m2 D1,8 q21 Days:   A cycle is every 21 days:     Gemcitabine      Cisplatin   **Always confirm dose/schedule in your pharmacy ordering system**  REASON: Disease Progression PRIOR TREATMENT: Cisplatin 25 mg/m2 D1,8 + Gemcitabine 1,000 mg/m2 D1,8 q21 Days TREATMENT RESPONSE: Stable Disease (SD)  START OFF PATHWAY REGIMEN - Other   [id number]:FOLFOXIRI (Fluorouracil 2,400 mg/m2/cycle) q14 Days:   A cycle is every 14 days:     Irinotecan      Oxaliplatin      Leucovorin      Fluorouracil   **Always confirm dose/schedule in your pharmacy ordering system**  Patient Characteristics: Intent of Therapy: Non-Curative / Palliative Intent, Discussed with Patient

## 2021-01-18 NOTE — Progress Notes (Signed)
[facility]   Telephone:(336) [phone] Fax:(336) [phone]   Clinic Follow up Note   Patient Care Team: [name], MD as PCP - General (Family Medicine) [name], MD as Consulting Physician (Oncology) [name], RN (Inactive) as Oncology Nurse Navigator  Date of Service:  01/18/2021  CHIEF COMPLAINT: f/u of intrahepatic cholangiocarcinoma  CURRENT THERAPY:  First line gemcitabine and cisplatin 2 weeks on/1 week off starting 08/16/20 -Addition of Durvalumab q3weeks starting 08/16/20  ASSESSMENT & PLAN:  Crystal Gibbs is a 45 y.o. female with   1. 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 -Her Liver biopsy from 07/26/20 showed adenocarcinoma. There is possibility her primary cancer is elsewhere, although imaging did not show other malignancy outside of liver. -Her 07/10/20 MRI abdomen also shows there is at least 1 satellite lesion centrally in the right lobe and her 07/30/20 CT Chest shows Pulmonary nodules are highly worrisome for metastatic disease.  -Her Upper endoscopy by Dr [name] on 08/18/20 did not show malignancy.  -I started her on chemo with First line gemcitabine and cisplatin 2 weeks on/1 week off in addition to Durvalumab q3weeks based on recent TOPAZ1 trial data. She started on 08/16/20.  -She continues to tolerate treatment relatively well. She feels she is slowly getting better.  -Abdominal MRI from October through 2022, and compared to her previous CT and MRI, especially the MRI from April 2022.  Her primary tumor in the liver is stable, however she has developed a more satellite lesions (at least 4 now), which is consistent with disease progression.  No other new distant metastasis on scan. -I recommend changing chemotherapy to second  line FOLFOX.  I also discussed other options, such as Y 90 or planned embolization.  --Chemotherapy consent: Side effects including but does not not limited to, fatigue, nausea, vomiting, diarrhea, hair loss, cold sensitivity and neuropathy, fluid retention, renal and kidney dysfunction, neutropenic fever, needed for blood transfusion, bleeding, were discussed with patient in great detail. She agrees to proceed.  Chemo consent obtained. -The goal of chemotherapy is palliative, to prolong her life and improve her quality of life -I will also send her initial liver biopsy for next generation [facility], to see if she is candidate for targeted therapy. -Patient had a question about liver surgery and transplant, unfortunately she is not a candidate for surgery given the metastasis in the liver.    2. Abdominal Pain, Hepatomegaly, acid reflux -secondary to #1 -She had abdominal pain 2 months before diagnosis. The pain is not daily (mostly with eating and prolonged sitting) but discomfort is mostly daily. -Pain started while she was in [place] and work up was concerning for malignancy. She chose to return to the [place] for more workup.  -Pain has much improved since she started chemo, continue monitoring and tramadol as needed. She has only needed occasionally. She also uses Tylenol. -continue protonix    3. Right shoulder and arm pain -She notes right shoulder pain that radiates down arm. She notes this is not constant and can fluctuate in severity, between 5-8/10. -She has Tramadol for her pain, she can continue or Tylenol/Ibuprofen.  -CT scan was negative for chest wall mass or bone lesions, her pain could be muscular pain in nature   5. Social and Acupuncturist -  She lives in [place] with her husband, although she was recently frequently in [place]. She is a [place] Citizen. -She and her husband does not speak much [place]. They require interpretor, one of whom they listed as a point of  contact. They have a niece that lives nearby who speaks [place]. -She is not working. He has job, but not currently working, as to help his wife. -She has [employer] health insurance. She does not have a PCP currently in the [place]. -Continue f/u with SW and financial advocate for support and to possibly apply for Medicare/Medicaid.   6. Genetic testing -Given her age and family history of GI cancer in her mother, she is eligible for genetic testing.  -Results negative with VUS in SDHA and RECQL4.   7. Indeterminate lung nodules -initially seen on chest CT 07/30/20, most are 35m or less  -increased size of medial right lung base nodule on 11/04/20 scan, other nodules stable. -stable to decrease in size of nodules on 12/27/20 CT.     PLAN:  -MRI reviewed, which showed cancer progression in liver -plan to change chemotherapy to second line FOLFOX, starting October 20 -We will send her liver biopsy out for [facility], I contact path lab today  -f/u with me with second cycle FOLFOX    No problem-specific Assessment & Plan notes found for this encounter.   SUMMARY OF ONCOLOGIC HISTORY: Oncology History Overview Note  Cancer Staging Intrahepatic cholangiocarcinoma ([facility] Staging form: Intrahepatic Bile Duct, AJCC 8th Edition - Clinical: Stage IB (cT1b, cN0, cM0) - Signed by FTruitt Merle MD on 08/04/2020    Intrahepatic cholangiocarcinoma ([facility]  08/19/2019 Procedure   Upper Endoscopy by Dr CBryan Lemma IMPRESSION - Normal esophagus. - Z-line regular, 35 cm from the incisors. - Gastritis. Biopsied. - Normal incisura, antrum and pylorus. - Erythematous duodenopathy. Biopsied. - Normal second portion of the duodenum and third portion of the duodenum. - No evidence of primary malignancy site noted on this study.   07/10/2020 Imaging   [place] 07/10/20 IMPRESSION: 1.  No acute hepatobiliary findings.   2. Heterogeneous liver echotexture with 3 masslike areas as described measuring 4.3 cm,  5.4 cm and 6.4 cm respectively. Recommend CT abdomen/pelvis with intravenous contrast for further evaluation.   07/10/2020 Imaging   CT AP 07/10/20  IMPRESSION: 1. There is a large masslike collection or mass in the central liver measuring at least 10.9 x 7.1 x 10.2 cm with 2 smaller adjacent satellite lesions/collections measuring 11 and 17 mm. These findings may represent a large abscess with satellite abscesses or a large neoplasm/malignancy. Recommend MRI for further evaluation. 2. Left renal cyst. 3. Calcified atherosclerosis in the distal abdominal aorta is mild. 4. Fibroid uterus.   07/10/2020 Imaging   MRI Abdomen 07/10/20  IMPRESSION: 1. The large central hepatic mass does not demonstrate signal characteristics or enhancement typical of a hemangioma or abscess, and is suspicious for malignancy. There is at least 1 satellite lesion centrally in the right lobe. As there are no signs of underlying cirrhosis, primary considerations include peripheral cholangiocarcinoma and metastatic disease. Tissue sampling recommended. 2. Mild intrahepatic biliary dilatation within the left hepatic lobe. Distortion of the hepatic vasculature without evidence of tumor thrombus. 3. No extrahepatic metastatic disease or primary malignancy identified within the abdomen.   07/26/2020 Imaging   CT AP 07/26/20  IMPRESSION: 1. Markedly poorly visualized and ill-defined 12 cm lesion within the liver. This lesion is better evaluated on MR abdomen 07/10/2020. 2. Otherwise no acute intra-abdominal  abnormality with markedly limited evaluation on this noncontrast study. 3.  Aortic Atherosclerosis (ICD10-I70.0).   07/26/2020 Initial Diagnosis   FINAL MICROSCOPIC DIAGNOSIS: 07/26/20  A. LIVER MASS, LEFT, NEEDLE CORE BIOPSY:  - Adenocarcinoma.   ADDENDUM: The adenocarcinoma is positive with immunohistochemistry for cytokeratin 7 and PAX 8.  The carcinoma is negative with cytokeratin 20, CDX 2, estrogen receptor,  GATA3, GCDFP, monoclonal and polyclonal CEA, cytokeratin 5/6, TTF-1, Napsin A, HepPar1, arginase 1 and WT1.  The morphology and immunophenotype are consistent with adenocarcinoma.  The differential for possible primary sites is broad including kidney although the morphology is not typical for more common types of renal cell carcinoma.  Pancreaticobiliary and gynecologic cannot be ruled out.    07/30/2020 Imaging   CT Chest 07/30/20  IMPRESSION: 1. Pulmonary nodules are highly worrisome for metastatic disease. 2. Hepatic masses are consistent with biopsy-proven adenocarcinoma and better evaluated on MR abdomen 07/10/2020.   08/04/2020 Initial Diagnosis   Intrahepatic cholangiocarcinoma ([facility])   08/04/2020 Cancer Staging   Staging form: Intrahepatic Bile Duct, AJCC 8th Edition - Clinical: Stage IB (cT1b, cN0, cM0) - Signed by [name], MD on 08/04/2020   08/13/2020 Procedure   PAC placement   08/16/2020 -  Chemotherapy   First line gemcitabine and cisplatin 2 weeks on/1 week off starting 08/16/20     08/16/2020 -  Chemotherapy   -Addition of durvalumab q3weeks with first-line chemo starting 08/16/20    08/18/2020 Procedure   Upper Endoscopy by Dr [name] IMPRESSION - Normal esophagus. - Z-line regular, 35 cm from the incisors. - Gastritis. Biopsied. - Normal incisura, antrum and pylorus. - Erythematous duodenopathy. Biopsied. - Normal second portion of the duodenum and third portion of the duodenum. - No evidence of primary malignancy site noted on this study.   08/18/2020 Pathology Results   FINAL MICROSCOPIC DIAGNOSIS:   A. DUODENUM, BULB, BIOPSY:  -  Erosive and reactive duodenal mucosa with Brunner's gland hyperplasia and mixed inflammation  -  No dysplasia or malignancy identified   B. STOMACH, BIOPSY:  -  Chronic active gastritis  -  H. pylori organisms present  -  No intestinal metaplasia identified  -  See comment   COMMENT:  Warthin-Starry stain is POSITIVE for organisms  consistent with  Helicobacter pylori.    09/08/2020 Genetic Testing   Negative hereditary cancer genetic testing: no pathogenic variants detected in [facility] Multi-Cancer Panel + Pancreatitis Genes.  Variants of uncertain significance detected in RECQL4 at c.119-20C>A (Intronic) and SDHA at c.1039A>G (p.Met347Val).  The report date is September 08, 2020.    The Multi-Cancer Panel with pancreatitis genes offered by [facility] includes sequencing and/or deletion duplication testing of the following 89 genes: AIP, ALK, APC, ATM, AXIN2,BAP1,  BARD1, BLM, BMPR1A, BRCA1, BRCA2, BRIP1, CASR, CDC73, CDH1, CDK4, CDKN1B, CDKN1C, CDKN2A (p14ARF), CDKN2A (p16INK4a), CEBPA, CFTR, CHEK2, CPA1, CTNNA1, CTRC, DICER1, DIS3L2, EGFR (c.2369C>T, p.Thr790Met variant only), EPCAM (Deletion/duplication testing only), FH, FLCN, GATA2, GPC3, GREM1 (Promoter region deletion/duplication testing only), HOXB13 (c.251G>A, p.Gly84Glu), HRAS, KIT, MAX, MEN1, MET, MITF (c.952G>A, p.Glu318Lys variant only), MLH1, MSH2, MSH3, MSH6, MUTYH, NBN, NF1, NF2, NTHL1, PALB2, PDGFRA, PHOX2B, PMS2, POLD1, POLE, POT1, PRKAR1A, PRSS1, PTCH1, PTEN, RAD50, RAD51C, RAD51D, RB1, RECQL4, RET, RNF43, RUNX1, SDHAF2, SDHA (sequence changes only), SDHB, SDHC, SDHD, SMAD4, SMARCA4, SMARCB1, SMARCE1, SPINK1, STK11, SUFU, TERC, TERT, TMEM127, TP53, TSC1, TSC2, VHL, WRN and WT1.    11/04/2020 Imaging   CT CAP  IMPRESSION: 1. Redemonstrated large, hypoenhancing mass of the central right lobe of the  liver, which is stable or perhaps slightly enlarged compared to prior examination, measuring 11.5 x 7.0 cm, previously 10.9 x 7.1 cm when measured similarly. Small satellite lesions appear to have enlarged in the interval and there is new tumor involvement of the caudate, overall constellation of findings consistent with worsened cholangiocarcinoma. 2. There is segmental biliary ductal dilatation, primarily seen in the left lobe of the liver, not significantly changed  compared to prior examination. The portal veins remain patent. 3. Interval enlargement of a nodule of the medial right lung base, consistent with worsened pulmonary metastatic disease. Additional small nodules are essentially stable and although technically nonspecific remain worrisome for additional small metastases. 4. Aortic atherosclerosis, advanced for patient age and gender. 5. Uterine fibroids.   12/27/2020 Imaging   CT Chest w/o contrast  IMPRESSION: Solid pulmonary nodules again seen, most are decreased in size compared to prior, a few are stable.   Hypoattenuating mass in the central right lobe of the liver appear similar to prior exam, although evaluation is limited due to lack of IV contrast.   Aortic Atherosclerosis (ICD10-I70.0).   01/27/2021 -  Chemotherapy   Patient is on Treatment Plan : COLORECTAL FOLFOX q14d        INTERVAL HISTORY:  Crystal Gibbs is here for a follow up of cholangiocarcinoma and discuss her restaging findings.  She is accompanied by her niece.  She reports more frequent postprandial abdominal pain in the right upper quadrant, usually happens right after eating or drinking, last for several minutes.  No nausea or other associated discomfort.  She has not needed pain medication for that, but the pain does slightly improved her eating.  Appetite is decent, weight is stable.  Her energy level remains to be moderate, she is able to function well at home, better overall than before.   All other systems were reviewed with the patient and are negative.  MEDICAL HISTORY:  Past Medical History:  Diagnosis Date   Cancer [facility])    Family history of breast cancer 08/23/2020   Family history of pancreatic cancer 08/23/2020   Family history of prostate cancer 08/23/2020   Family history of stomach cancer 08/23/2020   Fibroid    History of multiple miscarriages    x 3   Hypertension     SURGICAL HISTORY: Past Surgical History:  Procedure Laterality Date    BIOPSY  08/18/2020   Procedure: BIOPSY;  Surgeon: Lavena Bullion, DO;  Location: [facility];  Service: Gastroenterology;;   CATARACT EXTRACTION Bilateral    ESOPHAGOGASTRODUODENOSCOPY (EGD) WITH PROPOFOL N/A 08/18/2020   Procedure: ESOPHAGOGASTRODUODENOSCOPY (EGD) WITH PROPOFOL;  Surgeon: Lavena Bullion, DO;  Location: [facility];  Service: Gastroenterology;  Laterality: N/A;   IR IMAGING GUIDED PORT INSERTION  08/13/2020   PARATHYROIDECTOMY Right 02/19/15    I have reviewed the social history and family history with the patient and they are unchanged from previous note.  ALLERGIES:  has No Known Allergies.  MEDICATIONS:  Current Outpatient Medications  Medication Sig Dispense Refill   amLODipine (NORVASC) 2.5 MG tablet Take 1 tablet (2.5 mg total) by mouth daily. 90 tablet 1   dexamethasone (DECADRON) 4 MG tablet Take 2 tablets at breakfast for 3 days, starting the day after cisplatin 40 tablet 2   diclofenac Sodium (VOLTAREN) 1 % GEL Apply 4 g topically 4 (four) times daily. 50 g 2   magnesium oxide (MAG-OX) 400 (240 Mg) MG tablet Take 1 tablet (400 mg total) by mouth daily. 30 tablet 3  ondansetron (ZOFRAN ODT) 4 MG disintegrating tablet Take 1-2 tablets (4-8 mg total) by mouth every 6 (six) hours as needed for nausea or vomiting. 30 tablet 1   pantoprazole (PROTONIX) 40 MG tablet Take 1 tablet (40 mg total) by mouth daily. 30 tablet 3   polyethylene glycol powder (GLYCOLAX/MIRALAX) 17 GM/SCOOP powder Take 17 g by mouth 2 (two) times daily as needed. 3350 g 1   No current facility-administered medications for this visit.   Facility-Administered Medications Ordered in Other Visits  Medication Dose Route Frequency Provider Last Rate Last Admin   acetaminophen (TYLENOL) 325 MG tablet            magnesium sulfate 2 GM/50ML IVPB            magnesium sulfate 2 GM/50ML IVPB            palonosetron (ALOXI) 0.25 MG/5ML injection             PHYSICAL EXAMINATION: ECOG PERFORMANCE  STATUS: 2 - Symptomatic, <50% confined to bed  Vitals:   01/18/21 0859  BP: (!) 136/94  Pulse: (!) 117  Resp: 18  Temp: 98.4 F (36.9 C)  SpO2: 100%   Wt Readings from Last 3 Encounters:  01/18/21 152 lb 3.2 oz (69 kg)  01/13/21 150 lb 2 oz (68.1 kg)  01/06/21 149 lb 9.6 oz (67.9 kg)     GENERAL:alert, no distress and comfortable SKIN: skin color normal, no rashes or significant lesions EYES: normal, Conjunctiva are pink and non-injected, sclera clear  NEURO: alert & oriented x 3 with fluent speech  LABORATORY DATA:  I have reviewed the data as listed CBC Latest Ref Rng & Units 01/13/2021 01/06/2021 12/23/2020  WBC 4.0 - 10.5 K/uL 3.7(L) 9.3 2.4(L)  Hemoglobin 12.0 - 15.0 g/dL 8.5(L) 9.2(L) 9.9(L)  Hematocrit 36.0 - 46.0 % 26.0(L) 28.0(L) 30.1(L)  Platelets 150 - 400 K/uL 227 205 279     CMP Latest Ref Rng & Units 01/13/2021 01/06/2021 12/23/2020  Glucose 70 - 99 mg/dL 174(H) 164(H) 163(H)  BUN 6 - 20 mg/dL 16 11 14   Creatinine 0.44 - 1.00 mg/dL 0.71 0.73 0.69  Sodium 135 - 145 mmol/L 139 140 139  Potassium 3.5 - 5.1 mmol/L 4.0 3.9 3.7  Chloride 98 - 111 mmol/L 104 104 103  CO2 22 - 32 mmol/L 26 25 26   Calcium 8.9 - 10.3 mg/dL 9.1 9.3 9.6  Total Protein 6.5 - 8.1 g/dL 7.2 7.4 7.6  Total Bilirubin 0.3 - 1.2 mg/dL <0.2(L) 0.2(L) 0.2(L)  Alkaline Phos 38 - 126 U/L 188(H) 211(H) 208(H)  AST 15 - 41 U/L 30 23 36  ALT 0 - 44 U/L 24 11 33      RADIOGRAPHIC STUDIES: I have personally reviewed the radiological images as listed and agreed with the findings in the report. No results found.    No orders of the defined types were placed in this encounter.  All questions were answered. The patient knows to call the clinic with any problems, questions or concerns. No barriers to learning was detected. The total time spent in the appointment was 40 minutes.     [name], MD 01/18/2021   I, Wilburn Mylar, am acting as scribe for [name], MD.   I have reviewed the above  documentation for accuracy and completeness, and I agree with the above.

## 2021-01-18 NOTE — Progress Notes (Signed)
OFF PATHWAY REGIMEN - Other  No Change  Continue With Treatment as Ordered.  Original Decision Date/Time: 08/04/2020 19:27   [id number]:Cisplatin 25 mg/m2 D1,8 + Gemcitabine 1,000 mg/m2 D1,8 q21 Days:   A cycle is every 21 days:     Gemcitabine      Cisplatin   **Always confirm dose/schedule in your pharmacy ordering system**  Patient Characteristics: Intent of Therapy: Non-Curative / Palliative Intent, Discussed with Patient

## 2021-01-19 ENCOUNTER — Telehealth: Payer: Self-pay | Admitting: Hematology

## 2021-01-19 NOTE — Telephone Encounter (Signed)
Scheduled follow-up appointments per 10/11 los. Patient's husband is aware.

## 2021-01-20 NOTE — Progress Notes (Signed)
Pharmacist Chemotherapy Monitoring - Initial Assessment    Anticipated start date: 01/27/21   The following has been reviewed per standard work regarding the patient's treatment regimen: The patient's diagnosis, treatment plan and drug doses, and organ/hematologic function Lab orders and baseline tests specific to treatment regimen  The treatment plan start date, drug sequencing, and pre-medications Prior authorization status  Patient's documented medication list, including drug-drug interaction screen and prescriptions for anti-emetics and supportive care specific to the treatment regimen The drug concentrations, fluid compatibility, administration routes, and timing of the medications to be used The patient's access for treatment and lifetime cumulative dose history, if applicable  The patient's medication allergies and previous infusion related reactions, if applicable   Changes made to treatment plan:  N/A  Follow up needed:  Pending authorization for treatment     Kennith Center, Pharm.D., CPP 01/20/2021@9 :47 AM

## 2021-01-24 ENCOUNTER — Other Ambulatory Visit: Payer: Self-pay

## 2021-01-24 ENCOUNTER — Other Ambulatory Visit: Payer: 59 | Admitting: Hospice

## 2021-01-24 DIAGNOSIS — C221 Intrahepatic bile duct carcinoma: Secondary | ICD-10-CM

## 2021-01-24 DIAGNOSIS — I10 Essential (primary) hypertension: Secondary | ICD-10-CM

## 2021-01-24 DIAGNOSIS — Z515 Encounter for palliative care: Secondary | ICD-10-CM

## 2021-01-24 DIAGNOSIS — R531 Weakness: Secondary | ICD-10-CM

## 2021-01-24 NOTE — Progress Notes (Addendum)
[facility] Consult Note Telephone: [phone]  Fax: [phone]  PATIENT NAME: Crystal Gibbs DOB: Aug 08, 1975 MRN: [id number]  PRIMARY CARE PROVIDER:   [name], MD Crystal Gibbs, [address] [place],  [place] [address]  REFERRING PROVIDER: [name], MD Crystal Gibbs, [address] [place],  [place] [address]  RESPONSIBLE PARTY:  Self/Dodo : Crystal Gibbs     Name Crystal Gibbs [phone]  [phone]   Crystal Gibbs Spouse [phone]  [phone]       Visit is to build trust and highlight Palliative Medicine as specialized medical care for people living with serious illness, aimed at facilitating better quality of life through symptoms relief, assisting with advance care planning and complex medical decision making. This is a follow up visit.  RECOMMENDATIONS/PLAN:   Advance Care Planning/Code Status:  Full code status is desired due to religious reasons. Patient is a Full code  Goals of Care: Goals of care include to maximize quality of life and symptom management.  Visit consisted of counseling and education dealing with the complex and emotionally intense issues of symptom management and palliative care in the setting of serious and potentially life-threatening illness.  Patient reports that her faith and family support system help her to cope with her medical conditions.  Palliative care team will continue to support patient, patient's family, and medical team.  Symptom management/Plan:  Weakness: Improved.  Ambulatory without assistive device. Encouraged movement/exercise as tolerated for optimal wellbeing. Balance of rest and performance activity. 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 Appointment with with oncology for next chemo is 10/20.  Generalized body pain: Managed with  Tylenol and Tramadol HTN: Managed with Amlodipine.  Follow up: Palliative care will continue to follow for complex medical decision making, advance care planning, and clarification of goals. Return 6 weeks or prn. Encouraged to call provider sooner with any concerns.  CHIEF COMPLAINT: Palliative follow up  HISTORY OF PRESENT ILLNESS:  Crystal Gibbs a 45 y.o. female with multiple medical problems including weakness likely related to ongoing chemo, intrahepatic cholangiole carcinoma. History of hypertension, generalized body pain.  She denies pain/discomfort during visit, no nausea/vomiting ,able to communicate coherently with NP, her [place] accent not withstanding.  She reports weakness has improved; reports that there is plan to likely change her chemo treatment to something that will be more effective.  Chart review of epic reports on last oncology visit 01/18/2021 indicates that patient's primary tumor is stable, however she has developed at least 4 satellite lesions which is consistent with disease progression.  History obtained from review of EMR, discussion with primary team, family and/or patient. Records reviewed and summarized above. All 10 point systems reviewed and are negative except as documented in history of present illness above  Review and summarization of Epic records shows history from other than patient.   Palliative Care was asked to follow this patient o help address complex decision making in the context of advance care planning and goals of care clarification.   PHYSICAL EXAM  O2 96% room air HR 98 RR 18 BP 138/82 Constitutional: NAD General: Well groomed, cooperative EYES: anicteric sclera, lids intact, no discharge  ENMT: Moist mucous membrane CV: S1 S2, RRR, no LE edema Pulmonary: LCTA, no increased work of breathing, no cough, Abdomen: active BS + 4 quadrants, soft and non tender GU: no suprapubic tenderness MSK: weakness,  ambulatory without assistive device Skin:  warm and dry, no rashes or wounds on visible skin Neuro:  weakness, otherwise non focal Psych: non-anxious affect Hem/lymph/immuno: no widespread bruising  PERTINENT MEDICATIONS:  Outpatient Encounter Medications as of 01/24/2021  Medication Sig   amLODipine (NORVASC) 2.5 MG tablet Take 1 tablet (2.5 mg total) by mouth daily.   dexamethasone (DECADRON) 4 MG tablet Take 2 tablets at breakfast for 3 days, starting the day after cisplatin   diclofenac Sodium (VOLTAREN) 1 % GEL Apply 4 g topically 4 (four) times daily.   magnesium oxide (MAG-OX) 400 (240 Mg) MG tablet Take 1 tablet (400 mg total) by mouth daily.   ondansetron (ZOFRAN ODT) 4 MG disintegrating tablet Take 1-2 tablets (4-8 mg total) by mouth every 6 (six) hours as needed for nausea or vomiting.   pantoprazole (PROTONIX) 40 MG tablet Take 1 tablet (40 mg total) by mouth daily.   polyethylene glycol powder (GLYCOLAX/MIRALAX) 17 GM/SCOOP powder Take 17 g by mouth 2 (two) times daily as needed.   [DISCONTINUED] prochlorperazine (COMPAZINE) 10 MG tablet Take 1 tablet (10 mg total) by mouth every 6 (six) hours as needed (Nausea or vomiting).   Facility-Administered Encounter Medications as of 01/24/2021  Medication   acetaminophen (TYLENOL) 325 MG tablet   magnesium sulfate 2 GM/50ML IVPB   magnesium sulfate 2 GM/50ML IVPB   palonosetron (ALOXI) 0.25 MG/5ML injection    HOSPICE ELIGIBILITY/DIAGNOSIS: TBD  PAST MEDICAL HISTORY:  Past Medical History:  Diagnosis Date   Cancer [facility])    Family history of breast cancer 08/23/2020   Family history of pancreatic cancer 08/23/2020   Family history of prostate cancer 08/23/2020   Family history of stomach cancer 08/23/2020   Fibroid    History of multiple miscarriages    x 3   Hypertension      ALLERGIES: No Known Allergies    I spent 60 minutes providing this consultation; this includes time spent with patient/family, chart review and documentation. More than 50% of the time in  this consultation was spent on counseling and coordinating communication   Thank you for the opportunity to participate in the care of Crystal Gibbs Please call our office at [phone] if we can be of additional assistance.  Note: Portions of this note were generated with [employer]. Dictation errors may occur despite best attempts at proofreading.  Teodoro Spray, NP

## 2021-01-26 MED FILL — Dexamethasone Sodium Phosphate Inj 100 MG/10ML: INTRAMUSCULAR | Qty: 1 | Status: AC

## 2021-01-27 ENCOUNTER — Other Ambulatory Visit: Payer: Self-pay | Admitting: Hematology

## 2021-01-27 ENCOUNTER — Inpatient Hospital Stay: Payer: 59

## 2021-01-27 ENCOUNTER — Telehealth: Payer: Self-pay | Admitting: Hospice

## 2021-01-27 ENCOUNTER — Other Ambulatory Visit: Payer: Self-pay | Admitting: *Deleted

## 2021-01-27 ENCOUNTER — Inpatient Hospital Stay: Payer: 59 | Admitting: Hematology

## 2021-01-27 ENCOUNTER — Other Ambulatory Visit: Payer: Self-pay

## 2021-01-27 VITALS — BP 132/95 | HR 88 | Temp 98.4°F | Resp 16 | Wt 151.0 lb

## 2021-01-27 DIAGNOSIS — C221 Intrahepatic bile duct carcinoma: Secondary | ICD-10-CM

## 2021-01-27 DIAGNOSIS — Z5111 Encounter for antineoplastic chemotherapy: Secondary | ICD-10-CM | POA: Diagnosis not present

## 2021-01-27 LAB — CBC WITH DIFFERENTIAL (CANCER CENTER ONLY)
Abs Immature Granulocytes: 0.37 10*3/uL — ABNORMAL HIGH (ref 0.00–0.07)
Basophils Absolute: 0 10*3/uL (ref 0.0–0.1)
Basophils Relative: 0 %
Eosinophils Absolute: 0.1 10*3/uL (ref 0.0–0.5)
Eosinophils Relative: 2 %
HCT: 26 % — ABNORMAL LOW (ref 36.0–46.0)
Hemoglobin: 8.1 g/dL — ABNORMAL LOW (ref 12.0–15.0)
Immature Granulocytes: 4 %
Lymphocytes Relative: 20 %
Lymphs Abs: 1.9 10*3/uL (ref 0.7–4.0)
MCH: 30.3 pg (ref 26.0–34.0)
MCHC: 31.2 g/dL (ref 30.0–36.0)
MCV: 97.4 fL (ref 80.0–100.0)
Monocytes Absolute: 1.1 10*3/uL — ABNORMAL HIGH (ref 0.1–1.0)
Monocytes Relative: 11 %
Neutro Abs: 5.9 10*3/uL (ref 1.7–7.7)
Neutrophils Relative %: 63 %
Platelet Count: 201 10*3/uL (ref 150–400)
RBC: 2.67 MIL/uL — ABNORMAL LOW (ref 3.87–5.11)
RDW: 19.3 % — ABNORMAL HIGH (ref 11.5–15.5)
WBC Count: 9.4 10*3/uL (ref 4.0–10.5)
nRBC: 0.2 % (ref 0.0–0.2)

## 2021-01-27 LAB — CMP (CANCER CENTER ONLY)
ALT: 12 U/L (ref 0–44)
AST: 23 U/L (ref 15–41)
Albumin: 3.5 g/dL (ref 3.5–5.0)
Alkaline Phosphatase: 191 U/L — ABNORMAL HIGH (ref 38–126)
Anion gap: 10 (ref 5–15)
BUN: 13 mg/dL (ref 6–20)
CO2: 26 mmol/L (ref 22–32)
Calcium: 9.3 mg/dL (ref 8.9–10.3)
Chloride: 103 mmol/L (ref 98–111)
Creatinine: 0.66 mg/dL (ref 0.44–1.00)
GFR, Estimated: >60 mL/min (ref >60)
Glucose, Bld: 151 mg/dL — ABNORMAL HIGH (ref 70–99)
Potassium: 4.2 mmol/L (ref 3.5–5.1)
Sodium: 139 mmol/L (ref 135–145)
Total Bilirubin: <0.2 mg/dL — ABNORMAL LOW (ref 0.3–1.2)
Total Protein: 7.3 g/dL (ref 6.5–8.1)

## 2021-01-27 LAB — PREGNANCY, URINE: Preg Test, Ur: NEGATIVE

## 2021-01-27 MED ORDER — LEUCOVORIN CALCIUM INJECTION 350 MG
400.0000 mg/m2 | Freq: Once | INTRAVENOUS | Status: AC
  Administered 2021-01-27: 716 mg via INTRAVENOUS
  Filled 2021-01-27: qty 35.8

## 2021-01-27 MED ORDER — SODIUM CHLORIDE 0.9 % IV SOLN
10.0000 mg | Freq: Once | INTRAVENOUS | Status: AC
  Administered 2021-01-27: 10 mg via INTRAVENOUS
  Filled 2021-01-27: qty 10

## 2021-01-27 MED ORDER — OXALIPLATIN CHEMO INJECTION 100 MG/20ML
85.0000 mg/m2 | Freq: Once | INTRAVENOUS | Status: AC
  Administered 2021-01-27: 150 mg via INTRAVENOUS
  Filled 2021-01-27: qty 10

## 2021-01-27 MED ORDER — PALONOSETRON HCL INJECTION 0.25 MG/5ML
0.2500 mg | Freq: Once | INTRAVENOUS | Status: AC
  Administered 2021-01-27: 0.25 mg via INTRAVENOUS
  Filled 2021-01-27: qty 5

## 2021-01-27 MED ORDER — SODIUM CHLORIDE 0.9% FLUSH: 10.0000 mL | INTRAVENOUS | Status: DC | PRN

## 2021-01-27 MED ORDER — PROCHLORPERAZINE MALEATE 10 MG PO TABS
10.0000 mg | ORAL_TABLET | Freq: Once | ORAL | Status: AC
  Administered 2021-01-27: 10 mg via ORAL
  Filled 2021-01-27: qty 1

## 2021-01-27 MED ORDER — HEPARIN SOD (PORK) LOCK FLUSH 100 UNIT/ML IV SOLN: 500.0000 [IU] | Freq: Once | INTRAVENOUS | Status: DC | PRN

## 2021-01-27 MED ORDER — DEXTROSE 5 % IV SOLN: Freq: Once | INTRAVENOUS | Status: AC

## 2021-01-27 MED ORDER — SODIUM CHLORIDE 0.9 % IV SOLN
2400.0000 mg/m2 | INTRAVENOUS | Status: DC
  Administered 2021-01-27: 4300 mg via INTRAVENOUS
  Filled 2021-01-27: qty 86

## 2021-01-27 MED ORDER — ACETAMINOPHEN 325 MG PO TABS
650.0000 mg | ORAL_TABLET | Freq: Once | ORAL | Status: AC
  Administered 2021-01-27: 650 mg via ORAL
  Filled 2021-01-27: qty 2

## 2021-01-27 NOTE — Patient Instructions (Signed)
[facility]  Discharge Instructions: Thank you for choosing [facility] to provide your oncology and hematology care.   If you have a lab appointment with the [facility], please go directly to the [facility] and check in at the registration area.   Wear comfortable clothing and clothing appropriate for easy access to any Portacath or PICC line.   We strive to give you quality time with your provider. You may need to reschedule your appointment if you arrive late (15 or more minutes).  Arriving late affects you and other patients whose appointments are after yours.  Also, if you miss three or more appointments without notifying the office, you may be dismissed from the clinic at the provider's discretion.      For prescription refill requests, have your pharmacy contact our office and allow 72 hours for refills to be completed.    Today you received the following chemotherapy and/or immunotherapy agents OXALIPLATIN, LEUCOVORIN, 5FU      To help prevent nausea and vomiting after your treatment, we encourage you to take your nausea medication as directed.  BELOW ARE SYMPTOMS THAT SHOULD BE REPORTED IMMEDIATELY: *FEVER GREATER THAN 100.4 F (38 C) OR HIGHER *CHILLS OR SWEATING *NAUSEA AND VOMITING THAT IS NOT CONTROLLED WITH YOUR NAUSEA MEDICATION *UNUSUAL SHORTNESS OF BREATH *UNUSUAL BRUISING OR BLEEDING *URINARY PROBLEMS (pain or burning when urinating, or frequent urination) *BOWEL PROBLEMS (unusual diarrhea, constipation, pain near the anus) TENDERNESS IN MOUTH AND THROAT WITH OR WITHOUT PRESENCE OF ULCERS (sore throat, sores in mouth, or a toothache) UNUSUAL RASH, SWELLING OR PAIN  UNUSUAL VAGINAL DISCHARGE OR ITCHING   Items with * indicate a potential emergency and should be followed up as soon as possible or go to the Emergency Department if any problems should occur.  Please show the CHEMOTHERAPY ALERT CARD or IMMUNOTHERAPY ALERT  CARD at check-in to the Emergency Department and triage nurse.  Should you have questions after your visit or need to cancel or reschedule your appointment, please contact [facility]  Dept: [phone]  and follow the prompts.  Office hours are 8:00 a.m. to 4:30 p.m. Monday - Friday. Please note that voicemails left after 4:00 p.m. may not be returned until the following business day.  We are closed weekends and major holidays. You have access to a nurse at all times for urgent questions. Please call the main number to the clinic Dept: [phone] and follow the prompts.   For any non-urgent questions, you may also contact your provider using MyChart. We now offer e-Visits for anyone 18 and older to request care online for non-urgent symptoms. For details visit [email or web address].   Also download the MyChart app! Go to the app store, search "MyChart", open the app, select , and log in with your MyChart username and password.  Due to Covid, a mask is required upon entering the hospital/clinic. If you do not have a mask, one will be given to you upon arrival. For doctor visits, patients may have 1 support person aged 43 or older with them. For treatment visits, patients cannot have anyone with them due to current Covid guidelines and our immunocompromised population.

## 2021-01-27 NOTE — Telephone Encounter (Signed)
Voicemail received from [facility] reporting patient's DNR status needs to be changed due to religious reason. NP called back and assured her that was very possible; advised her to tear it up and that it will be removed from Epic and the records adjusted accordingly. She expressed appreciation timely handling. NP set up a follow up visit with patient, her spouse and [facility].

## 2021-01-28 LAB — CANCER ANTIGEN 19-9: CA 19-9: 118 U/mL — ABNORMAL HIGH (ref 0–35)

## 2021-01-29 ENCOUNTER — Other Ambulatory Visit: Payer: Self-pay

## 2021-01-29 ENCOUNTER — Inpatient Hospital Stay: Payer: 59

## 2021-01-29 VITALS — BP 145/97 | HR 127 | Temp 98.5°F | Resp 19

## 2021-01-29 DIAGNOSIS — Z95828 Presence of other vascular implants and grafts: Secondary | ICD-10-CM

## 2021-01-29 MED ORDER — HEPARIN SOD (PORK) LOCK FLUSH 100 UNIT/ML IV SOLN: 250.0000 [IU] | Freq: Once | INTRAVENOUS | Status: DC

## 2021-01-29 MED ORDER — SODIUM CHLORIDE 0.9% FLUSH: 10.0000 mL | Freq: Once | INTRAVENOUS | Status: DC

## 2021-02-01 ENCOUNTER — Encounter (HOSPITAL_COMMUNITY): Payer: Self-pay

## 2021-02-03 ENCOUNTER — Ambulatory Visit: Payer: 59

## 2021-02-03 ENCOUNTER — Other Ambulatory Visit: Payer: 59

## 2021-02-09 MED FILL — Dexamethasone Sodium Phosphate Inj 100 MG/10ML: INTRAMUSCULAR | Qty: 1 | Status: AC

## 2021-02-10 ENCOUNTER — Other Ambulatory Visit: Payer: Self-pay

## 2021-02-10 ENCOUNTER — Inpatient Hospital Stay (HOSPITAL_BASED_OUTPATIENT_CLINIC_OR_DEPARTMENT_OTHER): Payer: 59 | Admitting: Hematology

## 2021-02-10 ENCOUNTER — Inpatient Hospital Stay: Payer: 59

## 2021-02-10 ENCOUNTER — Inpatient Hospital Stay: Payer: 59 | Attending: Hematology

## 2021-02-10 VITALS — BP 131/93 | HR 88 | Temp 98.2°F | Resp 16 | Wt 151.0 lb

## 2021-02-10 DIAGNOSIS — C221 Intrahepatic bile duct carcinoma: Secondary | ICD-10-CM

## 2021-02-10 DIAGNOSIS — I10 Essential (primary) hypertension: Secondary | ICD-10-CM | POA: Diagnosis not present

## 2021-02-10 DIAGNOSIS — Z79899 Other long term (current) drug therapy: Secondary | ICD-10-CM | POA: Diagnosis not present

## 2021-02-10 DIAGNOSIS — K219 Gastro-esophageal reflux disease without esophagitis: Secondary | ICD-10-CM | POA: Insufficient documentation

## 2021-02-10 DIAGNOSIS — Z79891 Long term (current) use of opiate analgesic: Secondary | ICD-10-CM | POA: Diagnosis not present

## 2021-02-10 DIAGNOSIS — Z79630 Long term (current) use of alkylating agent: Secondary | ICD-10-CM | POA: Diagnosis not present

## 2021-02-10 DIAGNOSIS — Z95828 Presence of other vascular implants and grafts: Secondary | ICD-10-CM

## 2021-02-10 DIAGNOSIS — R918 Other nonspecific abnormal finding of lung field: Secondary | ICD-10-CM | POA: Diagnosis not present

## 2021-02-10 DIAGNOSIS — Z79631 Long term (current) use of antimetabolite agent: Secondary | ICD-10-CM | POA: Insufficient documentation

## 2021-02-10 DIAGNOSIS — G893 Neoplasm related pain (acute) (chronic): Secondary | ICD-10-CM | POA: Diagnosis not present

## 2021-02-10 DIAGNOSIS — Z5111 Encounter for antineoplastic chemotherapy: Secondary | ICD-10-CM | POA: Diagnosis not present

## 2021-02-10 LAB — CBC WITH DIFFERENTIAL (CANCER CENTER ONLY)
Abs Immature Granulocytes: 0.01 10*3/uL (ref 0.00–0.07)
Basophils Absolute: 0 10*3/uL (ref 0.0–0.1)
Basophils Relative: 1 %
Eosinophils Absolute: 0.3 10*3/uL (ref 0.0–0.5)
Eosinophils Relative: 6 %
HCT: 29.2 % — ABNORMAL LOW (ref 36.0–46.0)
Hemoglobin: 9.4 g/dL — ABNORMAL LOW (ref 12.0–15.0)
Immature Granulocytes: 0 %
Lymphocytes Relative: 30 %
Lymphs Abs: 1.3 10*3/uL (ref 0.7–4.0)
MCH: 30.6 pg (ref 26.0–34.0)
MCHC: 32.2 g/dL (ref 30.0–36.0)
MCV: 95.1 fL (ref 80.0–100.0)
Monocytes Absolute: 0.7 10*3/uL (ref 0.1–1.0)
Monocytes Relative: 16 %
Neutro Abs: 2.1 10*3/uL (ref 1.7–7.7)
Neutrophils Relative %: 47 %
Platelet Count: 139 10*3/uL — ABNORMAL LOW (ref 150–400)
RBC: 3.07 MIL/uL — ABNORMAL LOW (ref 3.87–5.11)
RDW: 17.3 % — ABNORMAL HIGH (ref 11.5–15.5)
WBC Count: 4.4 10*3/uL (ref 4.0–10.5)
nRBC: 0 % (ref 0.0–0.2)

## 2021-02-10 LAB — CMP (CANCER CENTER ONLY)
ALT: 15 U/L (ref 0–44)
AST: 26 U/L (ref 15–41)
Albumin: 3.6 g/dL (ref 3.5–5.0)
Alkaline Phosphatase: 143 U/L — ABNORMAL HIGH (ref 38–126)
Anion gap: 10 (ref 5–15)
BUN: 15 mg/dL (ref 6–20)
CO2: 26 mmol/L (ref 22–32)
Calcium: 9.2 mg/dL (ref 8.9–10.3)
Chloride: 104 mmol/L (ref 98–111)
Creatinine: 0.7 mg/dL (ref 0.44–1.00)
GFR, Estimated: >60 mL/min (ref >60)
Glucose, Bld: 150 mg/dL — ABNORMAL HIGH (ref 70–99)
Potassium: 3.8 mmol/L (ref 3.5–5.1)
Sodium: 140 mmol/L (ref 135–145)
Total Bilirubin: 0.4 mg/dL (ref 0.3–1.2)
Total Protein: 7.3 g/dL (ref 6.5–8.1)

## 2021-02-10 LAB — SAMPLE TO BLOOD BANK

## 2021-02-10 LAB — PREGNANCY, URINE: Preg Test, Ur: NEGATIVE

## 2021-02-10 MED ORDER — DEXTROSE 5 % IV SOLN: Freq: Once | INTRAVENOUS | Status: AC

## 2021-02-10 MED ORDER — PALONOSETRON HCL INJECTION 0.25 MG/5ML
0.2500 mg | Freq: Once | INTRAVENOUS | Status: AC
  Administered 2021-02-10: 0.25 mg via INTRAVENOUS
  Filled 2021-02-10: qty 5

## 2021-02-10 MED ORDER — SODIUM CHLORIDE 0.9 % IV SOLN
10.0000 mg | Freq: Once | INTRAVENOUS | Status: AC
  Administered 2021-02-10: 10 mg via INTRAVENOUS
  Filled 2021-02-10: qty 10

## 2021-02-10 MED ORDER — OXALIPLATIN CHEMO INJECTION 100 MG/20ML
85.0000 mg/m2 | Freq: Once | INTRAVENOUS | Status: AC
  Administered 2021-02-10: 150 mg via INTRAVENOUS
  Filled 2021-02-10: qty 20

## 2021-02-10 MED ORDER — SODIUM CHLORIDE 0.9 % IV SOLN
2400.0000 mg/m2 | INTRAVENOUS | Status: DC
  Administered 2021-02-10: 4300 mg via INTRAVENOUS
  Filled 2021-02-10: qty 86

## 2021-02-10 MED ORDER — ACETAMINOPHEN 325 MG PO TABS
650.0000 mg | ORAL_TABLET | Freq: Once | ORAL | Status: AC
  Administered 2021-02-10: 650 mg via ORAL
  Filled 2021-02-10: qty 2

## 2021-02-10 MED ORDER — LEUCOVORIN CALCIUM INJECTION 350 MG
400.0000 mg/m2 | Freq: Once | INTRAVENOUS | Status: AC
  Administered 2021-02-10: 716 mg via INTRAVENOUS
  Filled 2021-02-10: qty 35.8

## 2021-02-10 MED ORDER — SODIUM CHLORIDE 0.9% FLUSH
10.0000 mL | Freq: Once | INTRAVENOUS | Status: AC
  Administered 2021-02-10: 10 mL

## 2021-02-10 MED ORDER — PROCHLORPERAZINE EDISYLATE 10 MG/2ML IJ SOLN
10.0000 mg | Freq: Once | INTRAMUSCULAR | Status: AC
  Administered 2021-02-10: 10 mg via INTRAVENOUS

## 2021-02-10 NOTE — Patient Instructions (Signed)
[facility]  Discharge Instructions: Thank you for choosing [facility] to provide your oncology and hematology care.   If you have a lab appointment with the [facility], please go directly to the [facility] and check in at the registration area.   Wear comfortable clothing and clothing appropriate for easy access to any Portacath or PICC line.   We strive to give you quality time with your provider. You may need to reschedule your appointment if you arrive late (15 or more minutes).  Arriving late affects you and other patients whose appointments are after yours.  Also, if you miss three or more appointments without notifying the office, you may be dismissed from the clinic at the provider's discretion.      For prescription refill requests, have your pharmacy contact our office and allow 72 hours for refills to be completed.    Today you received the following chemotherapy and/or immunotherapy agents: oxaliplatin, leucovorin, fluorouracil.       To help prevent nausea and vomiting after your treatment, we encourage you to take your nausea medication as directed.  BELOW ARE SYMPTOMS THAT SHOULD BE REPORTED IMMEDIATELY: *FEVER GREATER THAN 100.4 F (38 C) OR HIGHER *CHILLS OR SWEATING *NAUSEA AND VOMITING THAT IS NOT CONTROLLED WITH YOUR NAUSEA MEDICATION *UNUSUAL SHORTNESS OF BREATH *UNUSUAL BRUISING OR BLEEDING *URINARY PROBLEMS (pain or burning when urinating, or frequent urination) *BOWEL PROBLEMS (unusual diarrhea, constipation, pain near the anus) TENDERNESS IN MOUTH AND THROAT WITH OR WITHOUT PRESENCE OF ULCERS (sore throat, sores in mouth, or a toothache) UNUSUAL RASH, SWELLING OR PAIN  UNUSUAL VAGINAL DISCHARGE OR ITCHING   Items with * indicate a potential emergency and should be followed up as soon as possible or go to the Emergency Department if any problems should occur.  Please show the CHEMOTHERAPY ALERT CARD or  IMMUNOTHERAPY ALERT CARD at check-in to the Emergency Department and triage nurse.  Should you have questions after your visit or need to cancel or reschedule your appointment, please contact [facility]  Dept: [phone]  and follow the prompts.  Office hours are 8:00 a.m. to 4:30 p.m. Monday - Friday. Please note that voicemails left after 4:00 p.m. may not be returned until the following business day.  We are closed weekends and major holidays. You have access to a nurse at all times for urgent questions. Please call the main number to the clinic Dept: [phone] and follow the prompts.   For any non-urgent questions, you may also contact your provider using MyChart. We now offer e-Visits for anyone 40 and older to request care online for non-urgent symptoms. For details visit [email or web address].   Also download the MyChart app! Go to the app store, search "MyChart", open the app, select [facility], and log in with your MyChart username and password.  Due to Covid, a mask is required upon entering the hospital/clinic. If you do not have a mask, one will be given to you upon arrival. For doctor visits, patients may have 1 support person aged 45 or older with them. For treatment visits, patients cannot have anyone with them due to current Covid guidelines and our immunocompromised population.

## 2021-02-10 NOTE — Progress Notes (Signed)
[place]   [phone] [phone]   Clinic Follow up Note   Patient Care Team: [name], MD as PCP - General (Family Medicine) [name], MD as Consulting Physician (Oncology) [name], RN (Inactive) as Oncology Nurse Navigator  Date of Service:  02/10/2021  CHIEF COMPLAINT: f/u of intrahepatic cholangiocarcinoma  CURRENT THERAPY:  Second line FOLFOX, starting 01/27/21  ASSESSMENT & PLAN:  Crystal Gibbs is a 45 y.o. female with   1. 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 -Her Liver biopsy from 07/26/20 showed adenocarcinoma. There is possibility her primary cancer is elsewhere, although imaging did not show other malignancy outside of liver. -Her 07/10/20 MRI abdomen also shows there is at least 1 satellite lesion centrally in the right lobe and her 07/30/20 CT Chest shows Pulmonary nodules are highly worrisome for metastatic disease.  -Her Upper endoscopy by Dr [name] on 08/18/20 did not show malignancy.  -I started her on chemo with First line gemcitabine and cisplatin 2 weeks on/1 week off in addition to Durvalumab q3weeks based on recent TOPAZ1 trial data. She started on 08/16/20.  -She continues to tolerate treatment relatively well. She feels she is slowly getting better.  -Abdominal MRI from October through 2022, and compared to her previous CT and MRI, especially the MRI from April 2022.  Her primary tumor in the liver is stable, however she has developed a more satellite lesions (at least 4 now), which is consistent with disease progression.  No other new distant metastasis on scan. -Foundation One was requested on the initial liver biopsy and showed MSI stable and no other targetable mutations. -she was switched  to second line FOLFOX on 01/27/21. She feels she is tolerating this better than her prior treatment.   2. Abdominal Pain, Hepatomegaly, acid reflux -secondary to #1 -She had abdominal pain 2 months before diagnosis. The pain is not daily (mostly with eating and prolonged sitting) but discomfort is mostly daily. -Pain started while she was in [place] and work up was concerning for malignancy. She chose to return to the [place] for more workup.  -Pain has much improved since she started chemo, continue monitoring and tramadol as needed. She has only needed occasionally. She also uses Tylenol. -continue protonix    3. Right shoulder and arm pain -She notes right shoulder pain that radiates down arm. She notes this is not constant and can fluctuate in severity, between 5-8/10. -She has Tramadol for her pain, she can continue or Tylenol/Ibuprofen.  -CT scan was negative for chest wall mass or bone lesions, her pain could be muscular pain in nature   5. Social and Acupuncturist -She lives in [place] with her husband, although she was recently frequently in [place]. She is a [place] Citizen. -She and her husband does not speak much [place]. They require interpretor, one of whom they listed as a point of contact. They have a niece that lives nearby who speaks [place]. -She is not working. He has job, but not currently working, as to help his wife. -She has [employer] health insurance. She does not have a PCP currently in the [place]. -Continue f/u with SW and financial advocate for support and to possibly apply for Medicare/Medicaid.   6. Genetic testing -Given her age and family history of  GI cancer in her mother, she is eligible for genetic testing.  -Results negative with VUS in SDHA and RECQL4.   7. Indeterminate lung nodules -initially seen on chest CT 07/30/20, most are 19m or less  -increased size of medial right lung base nodule on 11/04/20 scan, other nodules stable. -stable to decrease in size of nodules  on 12/27/20 CT.     PLAN:  -lab reviewed, will proceed with C2 FOLFOX today. -lab, flush, f/u, and FOLFOX in 2 weeks   No problem-specific Assessment & Plan notes found for this encounter.   SUMMARY OF ONCOLOGIC HISTORY: Oncology History Overview Note  Cancer Staging Intrahepatic cholangiocarcinoma ([facility] Staging form: Intrahepatic Bile Duct, AJCC 8th Edition - Clinical: Stage IB (cT1b, cN0, cM0) - Signed by FTruitt Merle MD on 08/04/2020    Intrahepatic cholangiocarcinoma ([facility]  08/19/2019 Procedure   Upper Endoscopy by Dr CBryan Lemma IMPRESSION - Normal esophagus. - Z-line regular, 35 cm from the incisors. - Gastritis. Biopsied. - Normal incisura, antrum and pylorus. - Erythematous duodenopathy. Biopsied. - Normal second portion of the duodenum and third portion of the duodenum. - No evidence of primary malignancy site noted on this study.   07/10/2020 Imaging   [place] 07/10/20 IMPRESSION: 1.  No acute hepatobiliary findings.   2. Heterogeneous liver echotexture with 3 masslike areas as described measuring 4.3 cm, 5.4 cm and 6.4 cm respectively. Recommend CT abdomen/pelvis with intravenous contrast for further evaluation.   07/10/2020 Imaging   CT AP 07/10/20  IMPRESSION: 1. There is a large masslike collection or mass in the central liver measuring at least 10.9 x 7.1 x 10.2 cm with 2 smaller adjacent satellite lesions/collections measuring 11 and 17 mm. These findings may represent a large abscess with satellite abscesses or a large neoplasm/malignancy. Recommend MRI for further evaluation. 2. Left renal cyst. 3. Calcified atherosclerosis in the distal abdominal aorta is mild. 4. Fibroid uterus.   07/10/2020 Imaging   MRI Abdomen 07/10/20  IMPRESSION: 1. The large central hepatic mass does not demonstrate signal characteristics or enhancement typical of a hemangioma or abscess, and is suspicious for malignancy. There is at least 1 satellite lesion centrally in the right  lobe. As there are no signs of underlying cirrhosis, primary considerations include peripheral cholangiocarcinoma and metastatic disease. Tissue sampling recommended. 2. Mild intrahepatic biliary dilatation within the left hepatic lobe. Distortion of the hepatic vasculature without evidence of tumor thrombus. 3. No extrahepatic metastatic disease or primary malignancy identified within the abdomen.   07/26/2020 Imaging   CT AP 07/26/20  IMPRESSION: 1. Markedly poorly visualized and ill-defined 12 cm lesion within the liver. This lesion is better evaluated on MR abdomen 07/10/2020. 2. Otherwise no acute intra-abdominal abnormality with markedly limited evaluation on this noncontrast study. 3.  Aortic Atherosclerosis (ICD10-I70.0).   07/26/2020 Initial Diagnosis   FINAL MICROSCOPIC DIAGNOSIS: 07/26/20  A. LIVER MASS, LEFT, NEEDLE CORE BIOPSY:  - Adenocarcinoma.   ADDENDUM: The adenocarcinoma is positive with immunohistochemistry for cytokeratin 7 and PAX 8.  The carcinoma is negative with cytokeratin 20, CDX 2, estrogen receptor, GATA3, GCDFP, monoclonal and polyclonal CEA, cytokeratin 5/6, TTF-1, Napsin A, HepPar1, arginase 1 and WT1.  The morphology and immunophenotype are consistent with adenocarcinoma.  The differential for possible primary sites is broad including kidney although the morphology is not typical for more common types of renal cell carcinoma.  Pancreaticobiliary and gynecologic cannot be ruled out.    07/30/2020 Imaging   CT Chest 07/30/20  IMPRESSION: 1. Pulmonary nodules  are highly worrisome for metastatic disease. 2. Hepatic masses are consistent with biopsy-proven adenocarcinoma and better evaluated on MR abdomen 07/10/2020.   08/04/2020 Initial Diagnosis   Intrahepatic cholangiocarcinoma ([facility])   08/04/2020 Cancer Staging   Staging form: Intrahepatic Bile Duct, AJCC 8th Edition - Clinical: Stage IB (cT1b, cN0, cM0) - Signed by [name], MD on 08/04/2020     08/13/2020 Procedure   PAC placement   08/16/2020 -  Chemotherapy   First line gemcitabine and cisplatin 2 weeks on/1 week off starting 08/16/20     08/16/2020 -  Chemotherapy   -Addition of durvalumab q3weeks with first-line chemo starting 08/16/20    08/18/2020 Procedure   Upper Endoscopy by Dr [name] IMPRESSION - Normal esophagus. - Z-line regular, 35 cm from the incisors. - Gastritis. Biopsied. - Normal incisura, antrum and pylorus. - Erythematous duodenopathy. Biopsied. - Normal second portion of the duodenum and third portion of the duodenum. - No evidence of primary malignancy site noted on this study.   08/18/2020 Pathology Results   FINAL MICROSCOPIC DIAGNOSIS:   A. DUODENUM, BULB, BIOPSY:  -  Erosive and reactive duodenal mucosa with Brunner's gland hyperplasia and mixed inflammation  -  No dysplasia or malignancy identified   B. STOMACH, BIOPSY:  -  Chronic active gastritis  -  H. pylori organisms present  -  No intestinal metaplasia identified  -  See comment   COMMENT:  Warthin-Starry stain is POSITIVE for organisms consistent with  Helicobacter pylori.    09/08/2020 Genetic Testing   Negative hereditary cancer genetic testing: no pathogenic variants detected in [facility] Multi-Cancer Panel + Pancreatitis Genes.  Variants of uncertain significance detected in RECQL4 at c.119-20C>A (Intronic) and SDHA at c.1039A>G (p.Met347Val).  The report date is September 08, 2020.    The Multi-Cancer Panel with pancreatitis genes offered by [facility] includes sequencing and/or deletion duplication testing of the following 89 genes: AIP, ALK, APC, ATM, AXIN2,BAP1,  BARD1, BLM, BMPR1A, BRCA1, BRCA2, BRIP1, CASR, CDC73, CDH1, CDK4, CDKN1B, CDKN1C, CDKN2A (p14ARF), CDKN2A (p16INK4a), CEBPA, CFTR, CHEK2, CPA1, CTNNA1, CTRC, DICER1, DIS3L2, EGFR (c.2369C>T, p.Thr790Met variant only), EPCAM (Deletion/duplication testing only), FH, FLCN, GATA2, GPC3, GREM1 (Promoter region deletion/duplication  testing only), HOXB13 (c.251G>A, p.Gly84Glu), HRAS, KIT, MAX, MEN1, MET, MITF (c.952G>A, p.Glu318Lys variant only), MLH1, MSH2, MSH3, MSH6, MUTYH, NBN, NF1, NF2, NTHL1, PALB2, PDGFRA, PHOX2B, PMS2, POLD1, POLE, POT1, PRKAR1A, PRSS1, PTCH1, PTEN, RAD50, RAD51C, RAD51D, RB1, RECQL4, RET, RNF43, RUNX1, SDHAF2, SDHA (sequence changes only), SDHB, SDHC, SDHD, SMAD4, SMARCA4, SMARCB1, SMARCE1, SPINK1, STK11, SUFU, TERC, TERT, TMEM127, TP53, TSC1, TSC2, VHL, WRN and WT1.    11/04/2020 Imaging   CT CAP  IMPRESSION: 1. Redemonstrated large, hypoenhancing mass of the central right lobe of the liver, which is stable or perhaps slightly enlarged compared to prior examination, measuring 11.5 x 7.0 cm, previously 10.9 x 7.1 cm when measured similarly. Small satellite lesions appear to have enlarged in the interval and there is new tumor involvement of the caudate, overall constellation of findings consistent with worsened cholangiocarcinoma. 2. There is segmental biliary ductal dilatation, primarily seen in the left lobe of the liver, not significantly changed compared to prior examination. The portal veins remain patent. 3. Interval enlargement of a nodule of the medial right lung base, consistent with worsened pulmonary metastatic disease. Additional small nodules are essentially stable and although technically nonspecific remain worrisome for additional small metastases. 4. Aortic atherosclerosis, advanced for patient age and gender. 5. Uterine fibroids.   12/27/2020 Imaging   CT Chest w/o  contrast  IMPRESSION: Solid pulmonary nodules again seen, most are decreased in size compared to prior, a few are stable.   Hypoattenuating mass in the central right lobe of the liver appear similar to prior exam, although evaluation is limited due to lack of IV contrast.   Aortic Atherosclerosis (ICD10-I70.0).   01/27/2021 -  Chemotherapy   Patient is on Treatment Plan : COLORECTAL FOLFOX q14d         INTERVAL HISTORY:  Crystal Gibbs is here for a follow up of intrahepatic cholangiocarcinoma. She was last seen by me on 01/18/21. She was seen in the infusion area. She reports she is doing well and feels she tolerated her first cycle well.   All other systems were reviewed with the patient and are negative.  MEDICAL HISTORY:  Past Medical History:  Diagnosis Date   Cancer [facility])    Family history of breast cancer 08/23/2020   Family history of pancreatic cancer 08/23/2020   Family history of prostate cancer 08/23/2020   Family history of stomach cancer 08/23/2020   Fibroid    History of multiple miscarriages    x 3   Hypertension     SURGICAL HISTORY: Past Surgical History:  Procedure Laterality Date   BIOPSY  08/18/2020   Procedure: BIOPSY;  Surgeon: Lavena Bullion, DO;  Location: [facility];  Service: Gastroenterology;;   CATARACT EXTRACTION Bilateral    ESOPHAGOGASTRODUODENOSCOPY (EGD) WITH PROPOFOL N/A 08/18/2020   Procedure: ESOPHAGOGASTRODUODENOSCOPY (EGD) WITH PROPOFOL;  Surgeon: Lavena Bullion, DO;  Location: [facility];  Service: Gastroenterology;  Laterality: N/A;   IR IMAGING GUIDED PORT INSERTION  08/13/2020   PARATHYROIDECTOMY Right 02/19/15    I have reviewed the social history and family history with the patient and they are unchanged from previous note.  ALLERGIES:  has No Known Allergies.  MEDICATIONS:  Current Outpatient Medications  Medication Sig Dispense Refill   amLODipine (NORVASC) 2.5 MG tablet Take 1 tablet (2.5 mg total) by mouth daily. 90 tablet 1   dexamethasone (DECADRON) 4 MG tablet Take 2 tablets at breakfast for 3 days, starting the day after cisplatin 40 tablet 2   diclofenac Sodium (VOLTAREN) 1 % GEL Apply 4 g topically 4 (four) times daily. 50 g 2   magnesium oxide (MAG-OX) 400 (240 Mg) MG tablet Take 1 tablet (400 mg total) by mouth daily. 30 tablet 3   ondansetron (ZOFRAN ODT) 4 MG disintegrating tablet Take 1-2 tablets (4-8 mg  total) by mouth every 6 (six) hours as needed for nausea or vomiting. 30 tablet 1   pantoprazole (PROTONIX) 40 MG tablet Take 1 tablet (40 mg total) by mouth daily. 30 tablet 3   polyethylene glycol powder (GLYCOLAX/MIRALAX) 17 GM/SCOOP powder Take 17 g by mouth 2 (two) times daily as needed. 3350 g 1   No current facility-administered medications for this visit.   Facility-Administered Medications Ordered in Other Visits  Medication Dose Route Frequency Provider Last Rate Last Admin   acetaminophen (TYLENOL) 325 MG tablet            dexamethasone (DECADRON) 10 mg in sodium chloride 0.9 % 50 mL IVPB  10 mg Intravenous Once [name], MD 204 mL/hr at 02/10/21 1151 10 mg at 02/10/21 1151   fluorouracil (ADRUCIL) 4,300 mg in sodium chloride 0.9 % 64 mL chemo infusion  2,400 mg/m2 (Treatment Plan Recorded) Intravenous 1 day or 1 dose [name], MD       leucovorin 716 mg in dextrose 5 % 250 mL  infusion  400 mg/m2 (Treatment Plan Recorded) Intravenous Once [name], MD       magnesium sulfate 2 GM/50ML IVPB            magnesium sulfate 2 GM/50ML IVPB            oxaliplatin (ELOXATIN) 150 mg in dextrose 5 % 500 mL chemo infusion  85 mg/m2 (Treatment Plan Recorded) Intravenous Once [name], MD       palonosetron (ALOXI) 0.25 MG/5ML injection             PHYSICAL EXAMINATION: ECOG PERFORMANCE STATUS: 2 - Symptomatic, <50% confined to bed  There were no vitals filed for this visit. Wt Readings from Last 3 Encounters:  02/10/21 151 lb (68.5 kg)  01/27/21 151 lb (68.5 kg)  01/18/21 152 lb 3.2 oz (69 kg)     GENERAL:alert, no distress and comfortable SKIN: skin color normal, no rashes or significant lesions EYES: normal, Conjunctiva are pink and non-injected, sclera clear  NEURO: alert & oriented x 3 with fluent speech  LABORATORY DATA:  I have reviewed the data as listed CBC Latest Ref Rng & Units 02/10/2021 01/27/2021 01/13/2021  WBC 4.0 - 10.5 K/uL 4.4 9.4 3.7(L)  Hemoglobin 12.0 - 15.0  g/dL 9.4(L) 8.1(L) 8.5(L)  Hematocrit 36.0 - 46.0 % 29.2(L) 26.0(L) 26.0(L)  Platelets 150 - 400 K/uL 139(L) 201 227     CMP Latest Ref Rng & Units 02/10/2021 01/27/2021 01/13/2021  Glucose 70 - 99 mg/dL 150(H) 151(H) 174(H)  BUN 6 - 20 mg/dL _0 Creatinine 0.44 - 1.00 mg/dL 0.70 0.66 0.71  Sodium 135 - 145 mmol/L 140 139 139  Potassium 3.5 - 5.1 mmol/L 3.8 4.2 4.0  Chloride 98 - 111 mmol/L 104 103 104  CO2 22 - 32 mmol/L _1 Calcium 8.9 - 10.3 mg/dL 9.2 9.3 9.1  Total Protein 6.5 - 8.1 g/dL 7.3 7.3 7.2  Total Bilirubin 0.3 - 1.2 mg/dL 0.4 <0.2(L) <0.2(L)  Alkaline Phos 38 - 126 U/L 143(H) 191(H) 188(H)  AST 15 - 41 U/L _2 ALT 0 - 44 U/L _3 RADIOGRAPHIC STUDIES: I have personally reviewed the radiological images as listed and agreed with the findings in the report. No results found.    No orders of the defined types were placed in this encounter.  All questions were answered. The patient knows to call the clinic with any problems, questions or concerns. No barriers to learning was detected. The total time spent in the appointment was 30 minutes.     [name], MD 02/10/2021   I, Wilburn Mylar, am acting as scribe for [name], MD.   I have reviewed the above documentation for accuracy and completeness, and I agree with the above.

## 2021-02-10 NOTE — Progress Notes (Signed)
Patient c/o nausea after infusion of oxaliplatin and leucovorin. Patient noted to produce approx 300cc clear yellow emesis. Dr Burr Medico notified and orders placed. After intervention, patient reported feeling much better and was discharged in stable condition.

## 2021-02-11 LAB — CANCER ANTIGEN 19-9: CA 19-9: 108 U/mL — ABNORMAL HIGH (ref 0–35)

## 2021-02-12 ENCOUNTER — Encounter: Payer: Self-pay | Admitting: Hematology

## 2021-02-12 ENCOUNTER — Inpatient Hospital Stay: Payer: 59

## 2021-02-12 ENCOUNTER — Other Ambulatory Visit: Payer: Self-pay

## 2021-02-12 VITALS — BP 136/80 | HR 109 | Temp 97.5°F | Resp 19

## 2021-02-12 DIAGNOSIS — Z5111 Encounter for antineoplastic chemotherapy: Secondary | ICD-10-CM | POA: Diagnosis not present

## 2021-02-12 DIAGNOSIS — C221 Intrahepatic bile duct carcinoma: Secondary | ICD-10-CM

## 2021-02-12 MED ORDER — HEPARIN SOD (PORK) LOCK FLUSH 100 UNIT/ML IV SOLN
500.0000 [IU] | Freq: Once | INTRAVENOUS | Status: AC | PRN
  Administered 2021-02-12: 500 [IU]

## 2021-02-12 MED ORDER — SODIUM CHLORIDE 0.9% FLUSH
10.0000 mL | INTRAVENOUS | Status: DC | PRN
  Administered 2021-02-12: 10 mL

## 2021-02-17 ENCOUNTER — Ambulatory Visit: Payer: 59 | Admitting: Hematology

## 2021-02-17 ENCOUNTER — Other Ambulatory Visit: Payer: 59

## 2021-02-17 ENCOUNTER — Ambulatory Visit: Payer: 59

## 2021-02-24 ENCOUNTER — Other Ambulatory Visit: Payer: Self-pay

## 2021-02-24 ENCOUNTER — Other Ambulatory Visit: Payer: 59

## 2021-02-24 ENCOUNTER — Ambulatory Visit: Payer: 59

## 2021-02-24 ENCOUNTER — Inpatient Hospital Stay: Payer: 59

## 2021-02-24 ENCOUNTER — Inpatient Hospital Stay (HOSPITAL_BASED_OUTPATIENT_CLINIC_OR_DEPARTMENT_OTHER): Payer: 59 | Admitting: Hematology

## 2021-02-24 ENCOUNTER — Encounter: Payer: Self-pay | Admitting: Hematology

## 2021-02-24 VITALS — BP 134/92 | HR 103 | Temp 98.5°F | Resp 18 | Wt 152.6 lb

## 2021-02-24 DIAGNOSIS — Z5111 Encounter for antineoplastic chemotherapy: Secondary | ICD-10-CM | POA: Diagnosis not present

## 2021-02-24 DIAGNOSIS — C221 Intrahepatic bile duct carcinoma: Secondary | ICD-10-CM | POA: Diagnosis not present

## 2021-02-24 DIAGNOSIS — Z95828 Presence of other vascular implants and grafts: Secondary | ICD-10-CM

## 2021-02-24 LAB — CMP (CANCER CENTER ONLY)
ALT: 16 U/L (ref 0–44)
AST: 27 U/L (ref 15–41)
Albumin: 3.6 g/dL (ref 3.5–5.0)
Alkaline Phosphatase: 163 U/L — ABNORMAL HIGH (ref 38–126)
Anion gap: 10 (ref 5–15)
BUN: 16 mg/dL (ref 6–20)
CO2: 24 mmol/L (ref 22–32)
Calcium: 9.1 mg/dL (ref 8.9–10.3)
Chloride: 105 mmol/L (ref 98–111)
Creatinine: 0.75 mg/dL (ref 0.44–1.00)
GFR, Estimated: >60 mL/min (ref >60)
Glucose, Bld: 183 mg/dL — ABNORMAL HIGH (ref 70–99)
Potassium: 4 mmol/L (ref 3.5–5.1)
Sodium: 139 mmol/L (ref 135–145)
Total Bilirubin: 0.4 mg/dL (ref 0.3–1.2)
Total Protein: 7.2 g/dL (ref 6.5–8.1)

## 2021-02-24 LAB — CBC WITH DIFFERENTIAL (CANCER CENTER ONLY)
Abs Immature Granulocytes: 0 10*3/uL (ref 0.00–0.07)
Basophils Absolute: 0 10*3/uL (ref 0.0–0.1)
Basophils Relative: 1 %
Eosinophils Absolute: 0.3 10*3/uL (ref 0.0–0.5)
Eosinophils Relative: 8 %
HCT: 30.6 % — ABNORMAL LOW (ref 36.0–46.0)
Hemoglobin: 10.1 g/dL — ABNORMAL LOW (ref 12.0–15.0)
Immature Granulocytes: 0 %
Lymphocytes Relative: 34 %
Lymphs Abs: 1.4 10*3/uL (ref 0.7–4.0)
MCH: 30.2 pg (ref 26.0–34.0)
MCHC: 33 g/dL (ref 30.0–36.0)
MCV: 91.6 fL (ref 80.0–100.0)
Monocytes Absolute: 0.5 10*3/uL (ref 0.1–1.0)
Monocytes Relative: 13 %
Neutro Abs: 1.8 10*3/uL (ref 1.7–7.7)
Neutrophils Relative %: 44 %
Platelet Count: 97 10*3/uL — ABNORMAL LOW (ref 150–400)
RBC: 3.34 MIL/uL — ABNORMAL LOW (ref 3.87–5.11)
RDW: 15.6 % — ABNORMAL HIGH (ref 11.5–15.5)
WBC Count: 4.1 10*3/uL (ref 4.0–10.5)
nRBC: 0 % (ref 0.0–0.2)

## 2021-02-24 LAB — PREGNANCY, URINE: Preg Test, Ur: NEGATIVE

## 2021-02-24 MED ORDER — ACETAMINOPHEN 325 MG PO TABS
650.0000 mg | ORAL_TABLET | Freq: Once | ORAL | Status: AC
  Administered 2021-02-24: 10:00:00 650 mg via ORAL
  Filled 2021-02-24: qty 2

## 2021-02-24 MED ORDER — SODIUM CHLORIDE 0.9% FLUSH
10.0000 mL | Freq: Once | INTRAVENOUS | Status: AC
  Administered 2021-02-24: 08:00:00 10 mL

## 2021-02-24 MED ORDER — HEPARIN SOD (PORK) LOCK FLUSH 100 UNIT/ML IV SOLN: 500.0000 [IU] | Freq: Once | INTRAVENOUS | Status: DC | PRN

## 2021-02-24 MED ORDER — LEUCOVORIN CALCIUM INJECTION 350 MG
400.0000 mg/m2 | Freq: Once | INTRAVENOUS | Status: AC
  Administered 2021-02-24: 11:00:00 716 mg via INTRAVENOUS
  Filled 2021-02-24: qty 35.8

## 2021-02-24 MED ORDER — DEXTROSE 5 % IV SOLN: Freq: Once | INTRAVENOUS | Status: AC

## 2021-02-24 MED ORDER — PALONOSETRON HCL INJECTION 0.25 MG/5ML
0.2500 mg | Freq: Once | INTRAVENOUS | Status: AC
  Administered 2021-02-24: 10:00:00 0.25 mg via INTRAVENOUS
  Filled 2021-02-24: qty 5

## 2021-02-24 MED ORDER — AMLODIPINE BESYLATE 2.5 MG PO TABS: 2.5000 mg | ORAL_TABLET | Freq: Every day | ORAL | 1 refills | Status: DC

## 2021-02-24 MED ORDER — PROCHLORPERAZINE EDISYLATE 10 MG/2ML IJ SOLN
10.0000 mg | Freq: Once | INTRAMUSCULAR | Status: AC
  Administered 2021-02-24: 09:00:00 10 mg via INTRAVENOUS
  Filled 2021-02-24: qty 2

## 2021-02-24 MED ORDER — OXALIPLATIN CHEMO INJECTION 100 MG/20ML
85.0000 mg/m2 | Freq: Once | INTRAVENOUS | Status: AC
  Administered 2021-02-24: 11:00:00 150 mg via INTRAVENOUS
  Filled 2021-02-24: qty 20

## 2021-02-24 MED ORDER — SODIUM CHLORIDE 0.9% FLUSH: 10.0000 mL | INTRAVENOUS | Status: DC | PRN

## 2021-02-24 MED ORDER — DEXAMETHASONE SODIUM PHOSPHATE 100 MG/10ML IJ SOLN
10.0000 mg | Freq: Once | INTRAMUSCULAR | Status: AC
  Administered 2021-02-24: 10:00:00 10 mg via INTRAVENOUS
  Filled 2021-02-24: qty 10

## 2021-02-24 MED ORDER — SODIUM CHLORIDE 0.9 % IV SOLN
2400.0000 mg/m2 | INTRAVENOUS | Status: DC
  Administered 2021-02-24: 13:00:00 4300 mg via INTRAVENOUS
  Filled 2021-02-24: qty 86

## 2021-02-24 NOTE — Progress Notes (Signed)
[facility]   [phone] [phone]   Clinic Follow up Note   Patient Care Team: [name], MD as PCP - General (Family Medicine) [name], MD as Consulting Physician (Oncology) [name], RN (Inactive) as Oncology Nurse Navigator  Date of Service:  02/24/2021  CHIEF COMPLAINT: f/u of intrahepatic cholangiocarcinoma  CURRENT THERAPY:  Second line FOLFOX, starting 01/27/21  ASSESSMENT & PLAN:  Crystal Gibbs is a 45 y.o. female with   1. 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 -Her Liver biopsy from 07/26/20 showed adenocarcinoma. There is possibility her primary cancer is elsewhere, although imaging did not show other malignancy outside of liver. -Her 07/10/20 MRI abdomen also shows there is at least 1 satellite lesion centrally in the right lobe and her 07/30/20 CT Chest shows Pulmonary nodules are highly worrisome for metastatic disease.  -Her Upper endoscopy by Dr [name] on 08/18/20 did not show malignancy.  -I started her on chemo with First line gemcitabine and cisplatin 2 weeks on/1 week off in addition to Durvalumab q3weeks based on recent TOPAZ1 trial data. She started on 08/16/20.  -She continues to tolerate treatment relatively well. She feels she is slowly getting better.  -Abdominal MRI from October through 2022, and compared to her previous CT and MRI, especially the MRI from April 2022.  Her primary tumor in the liver is stable, however she has developed a more satellite lesions (at least 4 now), which is consistent with disease progression.  No other new distant metastasis on scan. -Foundation One was requested on the initial liver biopsy and showed MSI stable and no other targetable mutations. -she was  switched to second line FOLFOX on 01/27/21. She feels she is tolerating this better than her prior treatment.   2. Abdominal Pain, Hepatomegaly, acid reflux -secondary to #1 -She had abdominal pain 2 months before diagnosis. The pain is not daily (mostly with eating and prolonged sitting) but discomfort is mostly daily. -Pain started while she was in [place] and work up was concerning for malignancy. She chose to return to the [place] for more workup.  -Pain has much improved since she started chemo, continue monitoring and tramadol as needed. She has only needed occasionally. She also uses Tylenol. -continue protonix    3. Right shoulder pain -She notes right shoulder/scapula pain rated 5-6/10. She previously reported the pain radiated down her arm -CT scan was negative for chest wall mass or bone lesions, her pain could be muscular pain in nature -I advised her to continue topical cream (she endorses using Bengay) and to try heating pad.   4. Social and Acupuncturist -She lives in [place] with her husband, although she was recently frequently in [place]. She is a [place] Citizen. -She and her husband does not speak much [place]. They require interpretor, one of whom they listed as a point of contact. They have a niece that lives nearby who speaks [place]. -She is not working. He has job, but not currently working, as to help his wife. -She has [employer] health insurance. She does not have a PCP currently in the [place]. -Continue f/u with SW and financial advocate for support and to possibly apply for Medicare/Medicaid.   5. Genetic testing -Given her age and family history of GI cancer in her  mother, she is eligible for genetic testing.  -Results negative with VUS in SDHA and RECQL4.   6. Indeterminate lung nodules -initially seen on chest CT 07/30/20, most are 87m or less  -increased size of medial right lung base nodule on 11/04/20 scan, other nodules stable. -stable to decrease in size of nodules on  12/27/20 CT.     PLAN:  -proceed with FOLFOX today at same dose  -lab, flush, f/u, and FOLFOX in 2, 4, and 6 weeks   No problem-specific Assessment & Plan notes found for this encounter.   SUMMARY OF ONCOLOGIC HISTORY: Oncology History Overview Note  Cancer Staging Intrahepatic cholangiocarcinoma ([facility] Staging form: Intrahepatic Bile Duct, AJCC 8th Edition - Clinical: Stage IB (cT1b, cN0, cM0) - Signed by FTruitt Merle MD on 08/04/2020    Intrahepatic [place]angiocarcinoma ([place]  08/19/2019 Procedure   Upper Endoscopy by Dr CBryan Lemma IMPRESSION - Normal esophagus. - Z-line regular, 35 cm from the incisors. - Gastritis. Biopsied. - Normal incisura, antrum and pylorus. - Erythematous duodenopathy. Biopsied. - Normal second portion of the duodenum and third portion of the duodenum. - No evidence of primary malignancy site noted on this study.   07/10/2020 Imaging   [place] 07/10/20 IMPRESSION: 1.  No acute hepatobiliary findings.   2. Heterogeneous liver echotexture with 3 masslike areas as described measuring 4.3 cm, 5.4 cm and 6.4 cm respectively. Recommend CT abdomen/pelvis with intravenous contrast for further evaluation.   07/10/2020 Imaging   CT AP 07/10/20  IMPRESSION: 1. There is a large masslike collection or mass in the central liver measuring at least 10.9 x 7.1 x 10.2 cm with 2 smaller adjacent satellite lesions/collections measuring 11 and 17 mm. These findings may represent a large abscess with satellite abscesses or a large neoplasm/malignancy. Recommend MRI for further evaluation. 2. Left renal cyst. 3. Calcified atherosclerosis in the distal abdominal aorta is mild. 4. Fibroid uterus.   07/10/2020 Imaging   MRI Abdomen 07/10/20  IMPRESSION: 1. The large central hepatic mass does not demonstrate signal characteristics or enhancement typical of a hemangioma or abscess, and is suspicious for malignancy. There is at least 1 satellite lesion centrally in the right lobe.  As there are no signs of underlying cirrhosis, primary considerations include peripheral cholangiocarcinoma and metastatic disease. Tissue sampling recommended. 2. Mild intrahepatic biliary dilatation within the left hepatic lobe. Distortion of the hepatic vasculature without evidence of tumor thrombus. 3. No extrahepatic metastatic disease or primary malignancy identified within the abdomen.   07/26/2020 Imaging   CT AP 07/26/20  IMPRESSION: 1. Markedly poorly visualized and ill-defined 12 cm lesion within the liver. This lesion is better evaluated on MR abdomen 07/10/2020. 2. Otherwise no acute intra-abdominal abnormality with markedly limited evaluation on this noncontrast study. 3.  Aortic Atherosclerosis (ICD10-I70.0).   07/26/2020 Initial Diagnosis   FINAL MICROSCOPIC DIAGNOSIS: 07/26/20  A. LIVER MASS, LEFT, NEEDLE CORE BIOPSY:  - Adenocarcinoma.   ADDENDUM: The adenocarcinoma is positive with immunohistochemistry for cytokeratin 7 and PAX 8.  The carcinoma is negative with cytokeratin 20, CDX 2, estrogen receptor, GATA3, GCDFP, monoclonal and polyclonal CEA, cytokeratin 5/6, TTF-1, Napsin A, HepPar1, arginase 1 and WT1.  The morphology and immunophenotype are consistent with adenocarcinoma.  The differential for possible primary sites is broad including kidney although the morphology is not typical for more common types of renal cell carcinoma.  Pancreaticobiliary and gynecologic cannot be ruled out.    07/30/2020 Imaging   CT Chest 07/30/20  IMPRESSION: 1. Pulmonary nodules are  highly worrisome for metastatic disease. 2. Hepatic masses are consistent with biopsy-proven adenocarcinoma and better evaluated on MR abdomen 07/10/2020.   08/04/2020 Initial Diagnosis   Intrahepatic cholangiocarcinoma ([place])   08/04/2020 Cancer Staging   Staging form: Intrahepatic Bile Duct, AJCC 8th Edition - Clinical: Stage IB (cT1b, cN0, cM0) - Signed by [name], MD on 08/04/2020    08/13/2020  Procedure   PAC placement   08/16/2020 -  Chemotherapy   First line gemcitabine and cisplatin 2 weeks on/1 week off starting 08/16/20     08/16/2020 -  Chemotherapy   -Addition of durvalumab q3weeks with first-line chemo starting 08/16/20    08/18/2020 Procedure   Upper Endoscopy by Dr [name] IMPRESSION - Normal esophagus. - Z-line regular, 35 cm from the incisors. - Gastritis. Biopsied. - Normal incisura, antrum and pylorus. - Erythematous duodenopathy. Biopsied. - Normal second portion of the duodenum and third portion of the duodenum. - No evidence of primary malignancy site noted on this study.   08/18/2020 Pathology Results   FINAL MICROSCOPIC DIAGNOSIS:   A. DUODENUM, BULB, BIOPSY:  -  Erosive and reactive duodenal mucosa with Brunner's gland hyperplasia and mixed inflammation  -  No dysplasia or malignancy identified   B. STOMACH, BIOPSY:  -  Chronic active gastritis  -  H. pylori organisms present  -  No intestinal metaplasia identified  -  See comment   COMMENT:  Warthin-Starry stain is POSITIVE for organisms consistent with  Helicobacter pylori.    09/08/2020 Genetic Testing   Negative hereditary cancer genetic testing: no pathogenic variants detected in [facility] Multi-Cancer Panel + Pancreatitis Genes.  Variants of uncertain significance detected in RECQL4 at c.119-20C>A (Intronic) and SDHA at c.1039A>G (p.Met347Val).  The report date is September 08, 2020.    The Multi-Cancer Panel with pancreatitis genes offered by [facility] includes sequencing and/or deletion duplication testing of the following 89 genes: AIP, ALK, APC, ATM, AXIN2,BAP1,  BARD1, BLM, BMPR1A, BRCA1, BRCA2, BRIP1, CASR, CDC73, CDH1, CDK4, CDKN1B, CDKN1C, CDKN2A (p14ARF), CDKN2A (p16INK4a), CEBPA, CFTR, CHEK2, CPA1, CTNNA1, CTRC, DICER1, DIS3L2, EGFR (c.2369C>T, p.Thr790Met variant only), EPCAM (Deletion/duplication testing only), FH, FLCN, GATA2, GPC3, GREM1 (Promoter region deletion/duplication testing only),  HOXB13 (c.251G>A, p.Gly84Glu), HRAS, KIT, MAX, MEN1, MET, MITF (c.952G>A, p.Glu318Lys variant only), MLH1, MSH2, MSH3, MSH6, MUTYH, NBN, NF1, NF2, NTHL1, PALB2, PDGFRA, PHOX2B, PMS2, POLD1, POLE, POT1, PRKAR1A, PRSS1, PTCH1, PTEN, RAD50, RAD51C, RAD51D, RB1, RECQL4, RET, RNF43, RUNX1, SDHAF2, SDHA (sequence changes only), SDHB, SDHC, SDHD, SMAD4, SMARCA4, SMARCB1, SMARCE1, SPINK1, STK11, SUFU, TERC, TERT, TMEM127, TP53, TSC1, TSC2, VHL, WRN and WT1.    11/04/2020 Imaging   CT CAP  IMPRESSION: 1. Redemonstrated large, hypoenhancing mass of the central right lobe of the liver, which is stable or perhaps slightly enlarged compared to prior examination, measuring 11.5 x 7.0 cm, previously 10.9 x 7.1 cm when measured similarly. Small satellite lesions appear to have enlarged in the interval and there is new tumor involvement of the caudate, overall constellation of findings consistent with worsened cholangiocarcinoma. 2. There is segmental biliary ductal dilatation, primarily seen in the left lobe of the liver, not significantly changed compared to prior examination. The portal veins remain patent. 3. Interval enlargement of a nodule of the medial right lung base, consistent with worsened pulmonary metastatic disease. Additional small nodules are essentially stable and although technically nonspecific remain worrisome for additional small metastases. 4. Aortic atherosclerosis, advanced for patient age and gender. 5. Uterine fibroids.   12/27/2020 Imaging   CT Chest w/o contrast  IMPRESSION: Solid pulmonary nodules again seen, most are decreased in size compared to prior, a few are stable.   Hypoattenuating mass in the central right lobe of the liver appear similar to prior exam, although evaluation is limited due to lack of IV contrast.   Aortic Atherosclerosis (ICD10-I70.0).   01/27/2021 -  Chemotherapy   Patient is on Treatment Plan : COLORECTAL FOLFOX q14d        INTERVAL  HISTORY:  Crystal Gibbs is here for a follow up of intrahepatic cholangiocarcinoma. She was last seen by me on 02/10/21. She presents to the clinic accompanied by an interpreter. She notes she was staying with her niece while she was more sick, but now that she's better she is back to living at her home with her husband.  She reports pain in her left shoulder blade area, mostly with bending over. She notes this is new since starting the FOLFOX. She also reports she always has a headache. She notes she is unsure what the cause is, could possibly be chemo or new sleeping pattern.   All other systems were reviewed with the patient and are negative.  MEDICAL HISTORY:  Past Medical History:  Diagnosis Date   Cancer [facility])    Family history of breast cancer 08/23/2020   Family history of pancreatic cancer 08/23/2020   Family history of prostate cancer 08/23/2020   Family history of stomach cancer 08/23/2020   Fibroid    History of multiple miscarriages    x 3   Hypertension     SURGICAL HISTORY: Past Surgical History:  Procedure Laterality Date   BIOPSY  08/18/2020   Procedure: BIOPSY;  Surgeon: Lavena Bullion, DO;  Location: [facility];  Service: Gastroenterology;;   CATARACT EXTRACTION Bilateral    ESOPHAGOGASTRODUODENOSCOPY (EGD) WITH PROPOFOL N/A 08/18/2020   Procedure: ESOPHAGOGASTRODUODENOSCOPY (EGD) WITH PROPOFOL;  Surgeon: Lavena Bullion, DO;  Location: [facility];  Service: Gastroenterology;  Laterality: N/A;   IR IMAGING GUIDED PORT INSERTION  08/13/2020   PARATHYROIDECTOMY Right 02/19/15    I have reviewed the social history and family history with the patient and they are unchanged from previous note.  ALLERGIES:  has No Known Allergies.  MEDICATIONS:  Current Outpatient Medications  Medication Sig Dispense Refill   amLODipine (NORVASC) 2.5 MG tablet Take 1 tablet (2.5 mg total) by mouth daily. 90 tablet 1   dexamethasone (DECADRON) 4 MG tablet Take 2 tablets at  breakfast for 3 days, starting the day after cisplatin 40 tablet 2   diclofenac Sodium (VOLTAREN) 1 % GEL Apply 4 g topically 4 (four) times daily. 50 g 2   magnesium oxide (MAG-OX) 400 (240 Mg) MG tablet Take 1 tablet (400 mg total) by mouth daily. 30 tablet 3   ondansetron (ZOFRAN ODT) 4 MG disintegrating tablet Take 1-2 tablets (4-8 mg total) by mouth every 6 (six) hours as needed for nausea or vomiting. 30 tablet 1   pantoprazole (PROTONIX) 40 MG tablet Take 1 tablet (40 mg total) by mouth daily. 30 tablet 3   polyethylene glycol powder (GLYCOLAX/MIRALAX) 17 GM/SCOOP powder Take 17 g by mouth 2 (two) times daily as needed. 3350 g 1   No current facility-administered medications for this visit.   Facility-Administered Medications Ordered in Other Visits  Medication Dose Route Frequency Provider Last Rate Last Admin   acetaminophen (TYLENOL) 325 MG tablet            magnesium sulfate 2 GM/50ML IVPB  magnesium sulfate 2 GM/50ML IVPB            palonosetron (ALOXI) 0.25 MG/5ML injection             PHYSICAL EXAMINATION: ECOG PERFORMANCE STATUS: 1 - Symptomatic but completely ambulatory  Vitals:   02/24/21 0829  BP: (!) 134/92  Pulse: (!) 103  Resp: 18  Temp: 98.5 F (36.9 C)  SpO2: 100%   Wt Readings from Last 3 Encounters:  02/24/21 152 lb 9 oz (69.2 kg)  02/10/21 151 lb (68.5 kg)  01/27/21 151 lb (68.5 kg)     GENERAL:alert, no distress and comfortable SKIN: skin color, texture, turgor are normal, no rashes or significant lesions EYES: normal, Conjunctiva are pink and non-injected, sclera clear  LUNGS: clear to auscultation and percussion with normal breathing effort HEART: regular rate & rhythm and no murmurs and no lower extremity edema ABDOMEN:abdomen soft, non-tender and normal bowel sounds, (+) liver slightly enlarged Musculoskeletal:no cyanosis of digits and no clubbing  NEURO: alert & oriented x 3 with fluent speech, no focal motor/sensory  deficits  LABORATORY DATA:  I have reviewed the data as listed CBC Latest Ref Rng & Units 02/10/2021 01/27/2021 01/13/2021  WBC 4.0 - 10.5 K/uL 4.4 9.4 3.7(L)  Hemoglobin 12.0 - 15.0 g/dL 9.4(L) 8.1(L) 8.5(L)  Hematocrit 36.0 - 46.0 % 29.2(L) 26.0(L) 26.0(L)  Platelets 150 - 400 K/uL 139(L) 201 227     CMP Latest Ref Rng & Units 02/10/2021 01/27/2021 01/13/2021  Glucose 70 - 99 mg/dL 150(H) 151(H) 174(H)  BUN 6 - 20 mg/dL 15 13 16   Creatinine 0.44 - 1.00 mg/dL 0.70 0.66 0.71  Sodium 135 - 145 mmol/L 140 139 139  Potassium 3.5 - 5.1 mmol/L 3.8 4.2 4.0  Chloride 98 - 111 mmol/L 104 103 104  CO2 22 - 32 mmol/L 26 26 26   Calcium 8.9 - 10.3 mg/dL 9.2 9.3 9.1  Total Protein 6.5 - 8.1 g/dL 7.3 7.3 7.2  Total Bilirubin 0.3 - 1.2 mg/dL 0.4 <0.2(L) <0.2(L)  Alkaline Phos 38 - 126 U/L 143(H) 191(H) 188(H)  AST 15 - 41 U/L 26 23 30   ALT 0 - 44 U/L 15 12 24       RADIOGRAPHIC STUDIES: I have personally reviewed the radiological images as listed and agreed with the findings in the report. No results found.    No orders of the defined types were placed in this encounter.  All questions were answered. The patient knows to call the clinic with any problems, questions or concerns. No barriers to learning was detected. The total time spent in the appointment was 30 minutes.     Aurea Graff 02/24/2021   I, Wilburn Mylar, am acting as scribe for [name], MD.   I have reviewed the above documentation for accuracy and completeness, and I agree with the above.

## 2021-02-24 NOTE — Progress Notes (Signed)
Per [name], "OK To Treat w/Plts of 97K"

## 2021-02-24 NOTE — Patient Instructions (Signed)
[facility] MEDICAL [facility]COLOG[facility]  Discharge Instructions: ?Thank you for choosing [facility] to provide your oncology and hematology care.  ? ?If you have a lab appointment with the Cancer Center, please go directly to the Cancer Center and check in at the registration area. ?  ?Wear comfortable clothing and clothing appropriate for easy access to any Portacath or PICC line.  ? ?We strive to give you quality time with your provider. You may need to reschedule your appointment if you arrive late (15 or more minutes).  Arriving late affects you and other patients whose appointments are after yours.  Also, if you miss three or more appointments without notifying the office, you may be dismissed from the clinic at the provider?s discretion.    ?  ?For prescription refill requests, have your pharmacy contact our office and allow 72 hours for refills to be completed.   ? ?Today you received the following chemotherapy and/or immunotherapy agents: Oxaliplatin/Leucovorin/5FU.    ?  ?To help prevent nausea and vomiting after your treatment, we encourage you to take your nausea medication as directed. ? ?BELOW ARE SYMPTOMS THAT SHOULD BE REPORTED IMMEDIATELY: ?*FEVER GREATER THAN 100.4 F (38 ?C) OR HIGHER ?*CHILLS OR SWEATING ?*NAUSEA AND VOMITING THAT IS NOT CONTROLLED WITH YOUR NAUSEA MEDICATION ?*UNUSUAL SHORTNESS OF BREATH ?*UNUSUAL BRUISING OR BLEEDING ?*URINARY PROBLEMS (pain or burning when urinating, or frequent urination) ?*BOWEL PROBLEMS (unusual diarrhea, constipation, pain near the anus) ?TENDERNESS IN MOUTH AND THROAT WITH OR WITHOUT PRESENCE OF ULCERS (sore throat, sores in mouth, or a toothache) ?UNUSUAL RASH, SWELLING OR PAIN  ?UNUSUAL VAGINAL DISCHARGE OR ITCHING  ? ?Items with * indicate a potential emergency and should be followed up as soon as possible or go to the Emergency Department if any problems should occur. ? ?Please show the CHEMOTHERAPY ALERT CARD or IMMUNOTHERAPY ALERT  CARD at check-in to the Emergency Department and triage nurse. ? ?Should you have questions after your visit or need to cancel or reschedule your appointment, please contact [facility]  Dept: [phone]  and follow the prompts.  Office hours are 8:00 a.m. to 4:30 p.m. Monday - Friday. Please note that voicemails left after 4:00 p.m. may not be returned until the following business day.  We are closed weekends and major holidays. You have access to a nurse at all times for urgent questions. Please call the main number to the clinic Dept: [phone] and follow the prompts. ? ? ?For any non-urgent questions, you may also contact your provider using MyChart. We now offer e-Visits for anyone 18 and older to request care online for non-urgent symptoms. For details visit [email or web address]. ?  ?Also download the MyChart app! Go to the app store, search "MyChart", open the app, select , and log in with your MyChart username and password. ? ?Due to Covid, a mask is required upon entering the hospital/clinic. If you do not have a mask, one will be given to you upon arrival. For doctor visits, patients may have 1 support person aged 18 or older with them. For treatment visits, patients cannot have anyone with them due to current Covid guidelines and our immunocompromised population.  ? ?

## 2021-02-25 LAB — CANCER ANTIGEN 19-9: CA 19-9: 144 U/mL — ABNORMAL HIGH (ref 0–35)

## 2021-02-26 ENCOUNTER — Inpatient Hospital Stay: Payer: 59

## 2021-02-26 ENCOUNTER — Other Ambulatory Visit: Payer: Self-pay

## 2021-02-26 VITALS — BP 119/80 | HR 119 | Temp 98.5°F | Resp 20

## 2021-02-26 DIAGNOSIS — C221 Intrahepatic bile duct carcinoma: Secondary | ICD-10-CM

## 2021-02-26 DIAGNOSIS — Z5111 Encounter for antineoplastic chemotherapy: Secondary | ICD-10-CM | POA: Diagnosis not present

## 2021-02-26 MED ORDER — SODIUM CHLORIDE 0.9% FLUSH
10.0000 mL | INTRAVENOUS | Status: DC | PRN
  Administered 2021-02-26: 10 mL

## 2021-02-26 MED ORDER — HEPARIN SOD (PORK) LOCK FLUSH 100 UNIT/ML IV SOLN
500.0000 [IU] | Freq: Once | INTRAVENOUS | Status: AC | PRN
  Administered 2021-02-26: 500 [IU]

## 2021-03-09 MED FILL — Dexamethasone Sodium Phosphate Inj 100 MG/10ML: INTRAMUSCULAR | Qty: 1 | Status: AC

## 2021-03-10 ENCOUNTER — Inpatient Hospital Stay: Payer: 59 | Attending: Hematology

## 2021-03-10 ENCOUNTER — Other Ambulatory Visit: Payer: Self-pay

## 2021-03-10 ENCOUNTER — Encounter: Payer: Self-pay | Admitting: Hematology

## 2021-03-10 ENCOUNTER — Inpatient Hospital Stay: Payer: 59

## 2021-03-10 ENCOUNTER — Inpatient Hospital Stay (HOSPITAL_BASED_OUTPATIENT_CLINIC_OR_DEPARTMENT_OTHER): Payer: 59 | Admitting: Hematology

## 2021-03-10 VITALS — BP 139/86 | HR 110 | Temp 98.1°F | Resp 18 | Wt 152.3 lb

## 2021-03-10 VITALS — HR 92

## 2021-03-10 DIAGNOSIS — Z5189 Encounter for other specified aftercare: Secondary | ICD-10-CM | POA: Diagnosis not present

## 2021-03-10 DIAGNOSIS — Z803 Family history of malignant neoplasm of breast: Secondary | ICD-10-CM | POA: Diagnosis not present

## 2021-03-10 DIAGNOSIS — G893 Neoplasm related pain (acute) (chronic): Secondary | ICD-10-CM | POA: Insufficient documentation

## 2021-03-10 DIAGNOSIS — K219 Gastro-esophageal reflux disease without esophagitis: Secondary | ICD-10-CM | POA: Insufficient documentation

## 2021-03-10 DIAGNOSIS — Z95828 Presence of other vascular implants and grafts: Secondary | ICD-10-CM

## 2021-03-10 DIAGNOSIS — I10 Essential (primary) hypertension: Secondary | ICD-10-CM | POA: Insufficient documentation

## 2021-03-10 DIAGNOSIS — C221 Intrahepatic bile duct carcinoma: Secondary | ICD-10-CM

## 2021-03-10 DIAGNOSIS — Z79899 Other long term (current) drug therapy: Secondary | ICD-10-CM | POA: Diagnosis not present

## 2021-03-10 DIAGNOSIS — Z5111 Encounter for antineoplastic chemotherapy: Secondary | ICD-10-CM | POA: Insufficient documentation

## 2021-03-10 DIAGNOSIS — M25511 Pain in right shoulder: Secondary | ICD-10-CM | POA: Diagnosis not present

## 2021-03-10 DIAGNOSIS — Z800 Family history of malignant neoplasm of digestive organs: Secondary | ICD-10-CM | POA: Insufficient documentation

## 2021-03-10 DIAGNOSIS — R918 Other nonspecific abnormal finding of lung field: Secondary | ICD-10-CM | POA: Insufficient documentation

## 2021-03-10 LAB — CBC WITH DIFFERENTIAL (CANCER CENTER ONLY)
Abs Immature Granulocytes: 0.01 10*3/uL (ref 0.00–0.07)
Basophils Absolute: 0 10*3/uL (ref 0.0–0.1)
Basophils Relative: 1 %
Eosinophils Absolute: 0.2 10*3/uL (ref 0.0–0.5)
Eosinophils Relative: 5 %
HCT: 32 % — ABNORMAL LOW (ref 36.0–46.0)
Hemoglobin: 10.4 g/dL — ABNORMAL LOW (ref 12.0–15.0)
Immature Granulocytes: 0 %
Lymphocytes Relative: 44 %
Lymphs Abs: 1.6 10*3/uL (ref 0.7–4.0)
MCH: 29.4 pg (ref 26.0–34.0)
MCHC: 32.5 g/dL (ref 30.0–36.0)
MCV: 90.4 fL (ref 80.0–100.0)
Monocytes Absolute: 0.6 10*3/uL (ref 0.1–1.0)
Monocytes Relative: 16 %
Neutro Abs: 1.2 10*3/uL — ABNORMAL LOW (ref 1.7–7.7)
Neutrophils Relative %: 34 %
Platelet Count: 112 10*3/uL — ABNORMAL LOW (ref 150–400)
RBC: 3.54 MIL/uL — ABNORMAL LOW (ref 3.87–5.11)
RDW: 15 % (ref 11.5–15.5)
WBC Count: 3.7 10*3/uL — ABNORMAL LOW (ref 4.0–10.5)
nRBC: 0 % (ref 0.0–0.2)

## 2021-03-10 LAB — CMP (CANCER CENTER ONLY)
ALT: 17 U/L (ref 0–44)
AST: 31 U/L (ref 15–41)
Albumin: 3.6 g/dL (ref 3.5–5.0)
Alkaline Phosphatase: 188 U/L — ABNORMAL HIGH (ref 38–126)
Anion gap: 10 (ref 5–15)
BUN: 13 mg/dL (ref 6–20)
CO2: 26 mmol/L (ref 22–32)
Calcium: 9.4 mg/dL (ref 8.9–10.3)
Chloride: 104 mmol/L (ref 98–111)
Creatinine: 0.75 mg/dL (ref 0.44–1.00)
GFR, Estimated: >60 mL/min (ref >60)
Glucose, Bld: 171 mg/dL — ABNORMAL HIGH (ref 70–99)
Potassium: 3.8 mmol/L (ref 3.5–5.1)
Sodium: 140 mmol/L (ref 135–145)
Total Bilirubin: 0.5 mg/dL (ref 0.3–1.2)
Total Protein: 7.5 g/dL (ref 6.5–8.1)

## 2021-03-10 LAB — PREGNANCY, URINE: Preg Test, Ur: NEGATIVE

## 2021-03-10 MED ORDER — SODIUM CHLORIDE 0.9 % IV SOLN
2400.0000 mg/m2 | INTRAVENOUS | Status: DC
  Administered 2021-03-10: 4300 mg via INTRAVENOUS
  Filled 2021-03-10: qty 86

## 2021-03-10 MED ORDER — LEUCOVORIN CALCIUM INJECTION 350 MG
400.0000 mg/m2 | Freq: Once | INTRAVENOUS | Status: AC
  Administered 2021-03-10: 716 mg via INTRAVENOUS
  Filled 2021-03-10: qty 35.8

## 2021-03-10 MED ORDER — PROCHLORPERAZINE EDISYLATE 10 MG/2ML IJ SOLN
10.0000 mg | Freq: Once | INTRAMUSCULAR | Status: AC
  Administered 2021-03-10: 10 mg via INTRAVENOUS
  Filled 2021-03-10: qty 2

## 2021-03-10 MED ORDER — PALONOSETRON HCL INJECTION 0.25 MG/5ML
0.2500 mg | Freq: Once | INTRAVENOUS | Status: AC
  Administered 2021-03-10: 0.25 mg via INTRAVENOUS
  Filled 2021-03-10: qty 5

## 2021-03-10 MED ORDER — SODIUM CHLORIDE 0.9 % IV SOLN
10.0000 mg | Freq: Once | INTRAVENOUS | Status: AC
  Administered 2021-03-10: 10 mg via INTRAVENOUS
  Filled 2021-03-10: qty 10

## 2021-03-10 MED ORDER — ACETAMINOPHEN 325 MG PO TABS
650.0000 mg | ORAL_TABLET | Freq: Once | ORAL | Status: AC
  Administered 2021-03-10: 650 mg via ORAL
  Filled 2021-03-10: qty 2

## 2021-03-10 MED ORDER — OXALIPLATIN CHEMO INJECTION 100 MG/20ML
85.0000 mg/m2 | Freq: Once | INTRAVENOUS | Status: AC
  Administered 2021-03-10: 150 mg via INTRAVENOUS
  Filled 2021-03-10: qty 20

## 2021-03-10 MED ORDER — HEPARIN SOD (PORK) LOCK FLUSH 100 UNIT/ML IV SOLN: 500.0000 [IU] | Freq: Once | INTRAVENOUS | Status: DC | PRN

## 2021-03-10 MED ORDER — SODIUM CHLORIDE 0.9% FLUSH
10.0000 mL | Freq: Once | INTRAVENOUS | Status: AC
  Administered 2021-03-10: 10 mL

## 2021-03-10 MED ORDER — DEXTROSE 5 % IV SOLN: Freq: Once | INTRAVENOUS | Status: AC

## 2021-03-10 MED ORDER — SODIUM CHLORIDE 0.9% FLUSH: 10.0000 mL | INTRAVENOUS | Status: DC | PRN

## 2021-03-10 NOTE — Progress Notes (Signed)
[place]   [phone] Fax:(336) [phone]   Clinic Follow up Note   Patient Care Team: [name], MD as PCP - General (Family Medicine) [name], MD as Consulting Physician (Oncology) [name], RN (Inactive) as Oncology Nurse Navigator  Date of Service:  03/10/2021  CHIEF COMPLAINT: f/u of intrahepatic cholangiocarcinoma  CURRENT THERAPY:  Second line FOLFOX, starting 01/27/21  ASSESSMENT & PLAN:  Crystal Gibbs is a 45 y.o. female with   1. 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 -Her Liver biopsy from 07/26/20 showed adenocarcinoma. There is possibility her primary cancer is elsewhere, although imaging did not show other malignancy outside of liver. -Her 07/10/20 MRI abdomen also shows there is at least 1 satellite lesion centrally in the right lobe and her 07/30/20 CT Chest shows Pulmonary nodules are highly worrisome for metastatic disease.  -Her Upper endoscopy by Dr [name] on 08/18/20 did not show malignancy.  -I started her on chemo with First line gemcitabine and cisplatin 2 weeks on/1 week off in addition to Durvalumab q3weeks based on recent TOPAZ1 trial data. She started on 08/16/20.  -Abdominal MRI from 01/08/21 showed: stable primary tumor in the liver, however she has developed a more satellite lesions (at least 4 now), which is consistent with disease progression.  No other new distant metastasis on scan. -Foundation One was requested on the initial liver biopsy and showed MSI stable and no other targetable mutations. -she was switched to second line FOLFOX on 01/27/21. She feels she is tolerating this better than her prior treatment. -labs reviewed, overall adequate to proceed with treatment today. -will add Udenyca  on day 3 due to neutropenia    2. Abdominal Pain, Hepatomegaly, acid reflux -secondary to #1 -She had abdominal pain 2 months before diagnosis. The pain is not daily (mostly with eating and prolonged sitting) but discomfort is mostly daily. -Pain started while she was in [place] and work up was concerning for malignancy. She chose to return to the [place] for more workup.  -Pain has much improved since she started chemo, continue monitoring and tramadol as needed. She has only needed occasionally. She also uses Tylenol. -continue protonix    3. Right shoulder pain -She notes right shoulder/scapula pain rated 5-6/10. She previously reported the pain radiated down her arm -CT scan was negative for chest wall mass or bone lesions, her pain could be muscular pain in nature -I advised her to continue topical cream (she endorses using Bengay) and to try heating pad.   4. Social and Acupuncturist -She lives in [place] with her husband, although she was recently frequently in [place]. She is a [place] Citizen. -She and her husband does not speak much [place]. They require interpretor, one of whom they listed as a point of contact. They have a niece that lives nearby who speaks [place]. -She is not working. He has job, but not currently working, as to help his wife. -She has [employer] health insurance. She does not have a PCP currently in the [place]. -Continue f/u with SW and financial advocate for support and to possibly apply for Medicare/Medicaid.   5. Genetic testing -Given her age and family history of GI cancer in her mother, she is eligible for genetic testing.  -Results negative with VUS in [facility]  and RECQL4.   6. Indeterminate lung nodules -initially seen on chest CT 07/30/20, most are 32m or less  -increased size of medial right lung base nodule on 11/04/20 scan, other nodules stable. -stable to decrease in size of nodules on 12/27/20 CT.     PLAN:  -proceed with FOLFOX today at same dose with Udenyca  on day 3  -lab, flush, f/u, and FOLFOX in 2, 4, and 6 weeks, will order restaging scan on next visit    No problem-specific Assessment & Plan notes found for this encounter.   SUMMARY OF ONCOLOGIC HISTORY: Oncology History Overview Note  Cancer Staging Intrahepatic cholangiocarcinoma ([place] Staging form: Intrahepatic Bile Duct, AJCC 8th Edition - Clinical: Stage IB (cT1b, cN0, cM0) - Signed by FTruitt Merle MD on 08/04/2020    Intrahepatic [place]angiocarcinoma ([place]  08/19/2019 Procedure   Upper Endoscopy by Dr CBryan Lemma IMPRESSION - Normal esophagus. - Z-line regular, 35 cm from the incisors. - Gastritis. Biopsied. - Normal incisura, antrum and pylorus. - Erythematous duodenopathy. Biopsied. - Normal second portion of the duodenum and third portion of the duodenum. - No evidence of primary malignancy site noted on this study.   07/10/2020 Imaging   [place] 07/10[place]20 IMPRESSION: 1.  No acute hepatobiliary findings.   2. Heterogeneous liver echotexture with 3 masslike areas as described measuring 4.3 cm, 5.4 cm and 6.4 cm respectively. Recommend CT abdomen/pelvis with intravenous contrast for further evaluation.   07/10/2020 Imaging   CT AP 07/10/20  IMPRESSION: 1. There is a large masslike collection or mass in the central liver measuring at least 10.9 x 7.1 x 10.2 cm with 2 smaller adjacent satellite lesions/collections measuring 11 and 17 mm. These findings may represent a large abscess with satellite abscesses or a large neoplasm/malignancy. Recommend MRI for further evaluation. 2. Left renal cyst. 3. Calcified atherosclerosis in the distal abdominal aorta is mild. 4. Fibroid uterus.   07/10/2020 Imaging   MRI Abdomen 07/10/20  IMPRESSION: 1. The large central hepatic mass does not demonstrate signal characteristics or enhancement typical of a hemangioma or abscess, and is suspicious for malignancy. There is at least 1 satellite lesion centrally in the right lobe. As there are no  signs of underlying cirrhosis, primary considerations include peripheral cholangiocarcinoma and metastatic disease. Tissue sampling recommended. 2. Mild intrahepatic biliary dilatation within the left hepatic lobe. Distortion of the hepatic vasculature without evidence of tumor thrombus. 3. No extrahepatic metastatic disease or primary malignancy identified within the abdomen.   07/26/2020 Imaging   CT AP 07/26/20  IMPRESSION: 1. Markedly poorly visualized and ill-defined 12 cm lesion within the liver. This lesion is better evaluated on MR abdomen 07/10/2020. 2. Otherwise no acute intra-abdominal abnormality with markedly limited evaluation on this noncontrast study. 3.  Aortic Atherosclerosis (ICD10-I70.0).   07/26/2020 Initial Diagnosis   FINAL MICROSCOPIC DIAGNOSIS: 07/26/20  A. LIVER MASS, LEFT, NEEDLE CORE BIOPSY:  - Adenocarcinoma.   ADDENDUM: The adenocarcinoma is positive with immunohistochemistry for cytokeratin 7 and PAX 8.  The carcinoma is negative with cytokeratin 20, CDX 2, estrogen receptor, GATA3, GCDFP, monoclonal and polyclonal CEA, cytokeratin 5/6, TTF-1, Napsin A, HepPar1, arginase 1 and WT1.  The morphology and immunophenotype are consistent with adenocarcinoma.  The differential for possible primary sites is broad including kidney although the morphology is not typical for more common types of renal cell carcinoma.  Pancreaticobiliary and gynecologic cannot be ruled out.    07/30/2020 Imaging   CT Chest 07/30/20  IMPRESSION: 1. Pulmonary nodules are highly  worrisome for metastatic disease. 2. Hepatic masses are consistent with biopsy-proven adenocarcinoma and better evaluated on MR abdomen 07/10/2020.   08/04/2020 Initial Diagnosis   Intrahepatic cholangiocarcinoma ([place])   08/04/2020 Cancer Staging   Staging form: Intrahepatic Bile Duct, AJCC 8th Edition - Clinical: Stage IB (cT1b, cN0, cM0) - Signed by [name], MD on 08/04/2020    08/13/2020 Procedure   PAC  placement   08/16/2020 -  Chemotherapy   First line gemcitabine and cisplatin 2 weeks on/1 week off starting 08/16/20     08/16/2020 -  Chemotherapy   -Addition of durvalumab q3weeks with first-line chemo starting 08/16/20    08/18/2020 Procedure   Upper Endoscopy by Dr [name] IMPRESSION - Normal esophagus. - Z-line regular, 35 cm from the incisors. - Gastritis. Biopsied. - Normal incisura, antrum and pylorus. - Erythematous duodenopathy. Biopsied. - Normal second portion of the duodenum and third portion of the duodenum. - No evidence of primary malignancy site noted on this study.   08/18/2020 Pathology Results   FINAL MICROSCOPIC DIAGNOSIS:   A. DUODENUM, BULB, BIOPSY:  -  Erosive and reactive duodenal mucosa with Brunner's gland hyperplasia and mixed inflammation  -  No dysplasia or malignancy identified   B. STOMACH, BIOPSY:  -  Chronic active gastritis  -  H. pylori organisms present  -  No intestinal metaplasia identified  -  See comment   COMMENT:  Warthin-Starry stain is POSITIVE for organisms consistent with  Helicobacter pylori.    09/08/2020 Genetic Testing   Negative hereditary cancer genetic testing: no pathogenic variants detected in [facility] Multi-Cancer Panel + Pancreatitis Genes.  Variants of uncertain significance detected in RECQL4 at c.119-20C>A (Intronic) and SDHA at c.1039A>G (p.Met347Val).  The report date is September 08, 2020.    The Multi-Cancer Panel with pancreatitis genes offered by [facility] includes sequencing and/or deletion duplication testing of the following 89 genes: AIP, ALK, APC, ATM, AXIN2,BAP1,  BARD1, BLM, BMPR1A, BRCA1, BRCA2, BRIP1, CASR, CDC73, CDH1, CDK4, CDKN1B, CDKN1C, CDKN2A (p14ARF), CDKN2A (p16INK4a), CEBPA, CFTR, CHEK2, CPA1, CTNNA1, CTRC, DICER1, DIS3L2, EGFR (c.2369C>T, p.Thr790Met variant only), EPCAM (Deletion/duplication testing only), FH, FLCN, GATA2, GPC3, GREM1 (Promoter region deletion/duplication testing only), HOXB13 (c.251G>A,  p.Gly84Glu), HRAS, KIT, MAX, MEN1, MET, MITF (c.952G>A, p.Glu318Lys variant only), MLH1, MSH2, MSH3, MSH6, MUTYH, NBN, NF1, NF2, NTHL1, PALB2, PDGFRA, PHOX2B, PMS2, POLD1, POLE, POT1, PRKAR1A, PRSS1, PTCH1, PTEN, RAD50, RAD51C, RAD51D, RB1, RECQL4, RET, RNF43, RUNX1, SDHAF2, SDHA (sequence changes only), SDHB, SDHC, SDHD, SMAD4, SMARCA4, SMARCB1, SMARCE1, SPINK1, STK11, SUFU, TERC, TERT, TMEM127, TP53, TSC1, TSC2, VHL, WRN and WT1.    11/04/2020 Imaging   CT CAP  IMPRESSION: 1. Redemonstrated large, hypoenhancing mass of the central right lobe of the liver, which is stable or perhaps slightly enlarged compared to prior examination, measuring 11.5 x 7.0 cm, previously 10.9 x 7.1 cm when measured similarly. Small satellite lesions appear to have enlarged in the interval and there is new tumor involvement of the caudate, overall constellation of findings consistent with worsened cholangiocarcinoma. 2. There is segmental biliary ductal dilatation, primarily seen in the left lobe of the liver, not significantly changed compared to prior examination. The portal veins remain patent. 3. Interval enlargement of a nodule of the medial right lung base, consistent with worsened pulmonary metastatic disease. Additional small nodules are essentially stable and although technically nonspecific remain worrisome for additional small metastases. 4. Aortic atherosclerosis, advanced for patient age and gender. 5. Uterine fibroids.   12/27/2020 Imaging   CT Chest w/o contrast  IMPRESSION: Solid pulmonary nodules again seen, most are decreased in size compared to prior, a few are stable.   Hypoattenuating mass in the central right lobe of the liver appear similar to prior exam, although evaluation is limited due to lack of IV contrast.   Aortic Atherosclerosis (ICD10-I70.0).   01/27/2021 -  Chemotherapy   Patient is on Treatment Plan : COLORECTAL FOLFOX q14d        INTERVAL HISTORY:  Crystal Gibbs is  here for a follow up of intrahepatic cholangiocarcinoma. She was last seen by me on 02/24/21. She presents to the clinic accompanied by an interpreter. She reports she is doing well overall. She had questions about the cause of her cancer today. 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   All other systems were reviewed with the patient and are negative.  MEDICAL HISTORY:  Past Medical History:  Diagnosis Date   Cancer [facility])    Family history of breast cancer 08/23/2020   Family history of pancreatic cancer 08/23/2020   Family history of prostate cancer 08/23/2020   Family history of stomach cancer 08/23/2020   Fibroid    History of multiple miscarriages    x 3   Hypertension     SURGICAL HISTORY: Past Surgical History:  Procedure Laterality Date   BIOPSY  08/18/2020   Procedure: BIOPSY;  Surgeon: Lavena Bullion, DO;  Location: [facility];  Service: Gastroenterology;;   CATARACT EXTRACTION Bilateral    ESOPHAGOGASTRODUODENOSCOPY (EGD) WITH PROPOFOL N/A 08/18/2020   Procedure: ESOPHAGOGASTRODUODENOSCOPY (EGD) WITH PROPOFOL;  Surgeon: Lavena Bullion, DO;  Location: [facility];  Service: Gastroenterology;  Laterality: N/A;   IR IMAGING GUIDED PORT INSERTION  08/13/2020   PARATHYROIDECTOMY Right 02/19/15    I have reviewed the social history and family history with the patient and they are unchanged from previous note.  ALLERGIES:  has No Known Allergies.  MEDICATIONS:  Current Outpatient Medications  Medication Sig Dispense Refill   amLODipine (NORVASC) 2.5 MG tablet Take 1 tablet (2.5 mg total) by mouth daily. 90 tablet 1   dexamethasone (DECADRON) 4 MG tablet Take 2 tablets at breakfast for 3 days, starting the day after cisplatin 40 tablet 2   diclofenac Sodium (VOLTAREN) 1 % GEL Apply 4 g topically 4 (four) times daily. 50 g 2    magnesium oxide (MAG-OX) 400 (240 Mg) MG tablet Take 1 tablet (400 mg total) by mouth daily. 30 tablet 3   ondansetron (ZOFRAN ODT) 4 MG disintegrating tablet Take 1-2 tablets (4-8 mg total) by mouth every 6 (six) hours as needed for nausea or vomiting. 30 tablet 1   pantoprazole (PROTONIX) 40 MG tablet Take 1 tablet (40 mg total) by mouth daily. 30 tablet 3   polyethylene glycol powder (GLYCOLAX/MIRALAX) 17 GM/SCOOP powder Take 17 g by mouth 2 (two) times daily as needed. 3350 g 1   No current facility-administered medications for this visit.   Facility-Administered Medications Ordered in Other Visits  Medication Dose Route Frequency Provider Last Rate Last Admin   acetaminophen (TYLENOL) 325 MG tablet            magnesium sulfate 2 GM/50ML IVPB            magnesium sulfate 2 GM/50ML IVPB            palonosetron (ALOXI) 0.25 MG/5ML injection  PHYSICAL EXAMINATION: ECOG PERFORMANCE STATUS: 2 - Symptomatic, <50% confined to bed  Vitals:   03/10/21 1159  BP: 139/86  Pulse: (!) 110  Resp: 18  Temp: 98.1 F (36.7 C)  SpO2: 100%   Wt Readings from Last 3 Encounters:  03/10/21 152 lb 4.8 oz (69.1 kg)  02/24/21 152 lb 9 oz (69.2 kg)  02/10/21 151 lb (68.5 kg)     GENERAL:alert, no distress and comfortable SKIN: skin color normal, no rashes or significant lesions EYES: normal, Conjunctiva are pink and non-injected, sclera clear  NEURO: alert & oriented x 3 with fluent speech  LABORATORY DATA:  I have reviewed the data as listed CBC Latest Ref Rng & Units 03/10/2021 02/24/2021 02/10/2021  WBC 4.0 - 10.5 K/uL 3.7(L) 4.1 4.4  Hemoglobin 12.0 - 15.0 g/dL 10.4(L) 10.1(L) 9.4(L)  Hematocrit 36.0 - 46.0 % 32.0(L) 30.6(L) 29.2(L)  Platelets 150 - 400 K/uL 112(L) 97(L) 139(L)     CMP Latest Ref Rng & Units 03/10/2021 02/24/2021 02/10/2021  Glucose 70 - 99 mg/dL 171(H) 183(H) 150(H)  BUN 6 - 20 mg/dL 13 16 15   Creatinine 0.44 - 1.00 mg/dL 0.75 0.75 0.70  Sodium 135 - 145  mmol/L 140 139 140  Potassium 3.5 - 5.1 mmol/L 3.8 4.0 3.8  Chloride 98 - 111 mmol/L 104 105 104  CO2 22 - 32 mmol/L 26 24 26   Calcium 8.9 - 10.3 mg/dL 9.4 9.1 9.2  Total Protein 6.5 - 8.1 g/dL 7.5 7.2 7.3  Total Bilirubin 0.3 - 1.2 mg/dL 0.5 0.4 0.4  Alkaline Phos 38 - 126 U/L 188(H) 163(H) 143(H)  AST 15 - 41 U/L 31 27 26   ALT 0 - 44 U/L 17 16 15       RADIOGRAPHIC STUDIES: I have personally reviewed the radiological images as listed and agreed with the findings in the report. No results found.    No orders of the defined types were placed in this encounter.  All questions were answered. The patient knows to call the clinic with any problems, questions or concerns. No barriers to learning was detected. The total time spent in the appointment was 30 minutes.     [name], MD 03/10/2021   I, Wilburn Mylar, am acting as scribe for [name], MD.   I have reviewed the above documentation for accuracy and completeness, and I agree with the above.

## 2021-03-10 NOTE — Progress Notes (Signed)
Per Dr Burr Medico on to treat with ANC 1.2

## 2021-03-10 NOTE — Patient Instructions (Signed)
[place] ONCOLOGY  Discharge Instructions: Thank you for choosing [facility] to provide your oncology and hematology care.   If you have a lab appointment with the [facility], please go directly to the [facility] and check in at the registration area.   Wear comfortable clothing and clothing appropriate for easy access to any Portacath or PICC line.   We strive to give you quality time with your provider. You may need to reschedule your appointment if you arrive late (15 or more minutes).  Arriving late affects you and other patients whose appointments are after yours.  Also, if you miss three or more appointments without notifying the office, you may be dismissed from the clinic at the provider's discretion.      For prescription refill requests, have your pharmacy contact our office and allow 72 hours for refills to be completed.    Today you received the following chemotherapy and/or immunotherapy agents: oxaliplatin, leucovorin, and fluorouracil      To help prevent nausea and vomiting after your treatment, we encourage you to take your nausea medication as directed.  BELOW ARE SYMPTOMS THAT SHOULD BE REPORTED IMMEDIATELY: *FEVER GREATER THAN 100.4 F (38 C) OR HIGHER *CHILLS OR SWEATING *NAUSEA AND VOMITING THAT IS NOT CONTROLLED WITH YOUR NAUSEA MEDICATION *UNUSUAL SHORTNESS OF BREATH *UNUSUAL BRUISING OR BLEEDING *URINARY PROBLEMS (pain or burning when urinating, or frequent urination) *BOWEL PROBLEMS (unusual diarrhea, constipation, pain near the anus) TENDERNESS IN MOUTH AND THROAT WITH OR WITHOUT PRESENCE OF ULCERS (sore throat, sores in mouth, or a toothache) UNUSUAL RASH, SWELLING OR PAIN  UNUSUAL VAGINAL DISCHARGE OR ITCHING   Items with * indicate a potential emergency and should be followed up as soon as possible or go to the Emergency Department if any problems should occur.  Please show the CHEMOTHERAPY ALERT CARD or  IMMUNOTHERAPY ALERT CARD at check-in to the Emergency Department and triage nurse.  Should you have questions after your visit or need to cancel or reschedule your appointment, please contact [facility]  Dept: [phone]  and follow the prompts.  Office hours are 8:00 a.m. to 4:30 p.m. Monday - Friday. Please note that voicemails left after 4:00 p.m. may not be returned until the following business day.  We are closed weekends and major holidays. You have access to a nurse at all times for urgent questions. Please call the main number to the clinic Dept: [phone] and follow the prompts.   For any non-urgent questions, you may also contact your provider using MyChart. We now offer e-Visits for anyone 31 and older to request care online for non-urgent symptoms. For details visit [email or web address].   Also download the MyChart app! Go to the app store, search "MyChart", open the app, select [facility], and log in with your MyChart username and password.  Due to Covid, a mask is required upon entering the hospital/clinic. If you do not have a mask, one will be given to you upon arrival. For doctor visits, patients may have 1 support person aged 52 or older with them. For treatment visits, patients cannot have anyone with them due to current Covid guidelines and our immunocompromised population.

## 2021-03-10 NOTE — Progress Notes (Signed)
Ok to speed up pump to finish in 44-45 hr per Dr Burr Medico

## 2021-03-11 LAB — CANCER ANTIGEN 19-9: CA 19-9: 174 U/mL — ABNORMAL HIGH (ref 0–35)

## 2021-03-12 ENCOUNTER — Other Ambulatory Visit: Payer: Self-pay

## 2021-03-12 ENCOUNTER — Inpatient Hospital Stay: Payer: 59

## 2021-03-12 VITALS — BP 118/89 | HR 105 | Temp 98.3°F | Resp 17

## 2021-03-12 DIAGNOSIS — Z95828 Presence of other vascular implants and grafts: Secondary | ICD-10-CM

## 2021-03-12 DIAGNOSIS — Z5111 Encounter for antineoplastic chemotherapy: Secondary | ICD-10-CM | POA: Diagnosis not present

## 2021-03-12 MED ORDER — SODIUM CHLORIDE 0.9% FLUSH
10.0000 mL | Freq: Once | INTRAVENOUS | Status: AC
  Administered 2021-03-12: 10 mL

## 2021-03-12 MED ORDER — HEPARIN SOD (PORK) LOCK FLUSH 100 UNIT/ML IV SOLN
500.0000 [IU] | Freq: Once | INTRAVENOUS | Status: AC
  Administered 2021-03-12: 500 [IU]

## 2021-03-12 NOTE — Progress Notes (Signed)
Patient was reported to have a high heart rate and had a infected sore on her right middle finger

## 2021-03-24 ENCOUNTER — Other Ambulatory Visit: Payer: Self-pay

## 2021-03-24 ENCOUNTER — Inpatient Hospital Stay: Payer: 59

## 2021-03-24 ENCOUNTER — Encounter: Payer: Self-pay | Admitting: Hematology

## 2021-03-24 ENCOUNTER — Inpatient Hospital Stay (HOSPITAL_BASED_OUTPATIENT_CLINIC_OR_DEPARTMENT_OTHER): Payer: 59 | Admitting: Hematology

## 2021-03-24 VITALS — BP 138/96 | HR 105 | Temp 98.4°F | Resp 16 | Ht 66.0 in | Wt 152.9 lb

## 2021-03-24 DIAGNOSIS — C221 Intrahepatic bile duct carcinoma: Secondary | ICD-10-CM

## 2021-03-24 DIAGNOSIS — Z95828 Presence of other vascular implants and grafts: Secondary | ICD-10-CM

## 2021-03-24 DIAGNOSIS — Z5111 Encounter for antineoplastic chemotherapy: Secondary | ICD-10-CM | POA: Diagnosis not present

## 2021-03-24 LAB — CBC WITH DIFFERENTIAL (CANCER CENTER ONLY)
Abs Immature Granulocytes: 0 10*3/uL (ref 0.00–0.07)
Basophils Absolute: 0 10*3/uL (ref 0.0–0.1)
Basophils Relative: 1 %
Eosinophils Absolute: 0.1 10*3/uL (ref 0.0–0.5)
Eosinophils Relative: 3 %
HCT: 30.6 % — ABNORMAL LOW (ref 36.0–46.0)
Hemoglobin: 10.2 g/dL — ABNORMAL LOW (ref 12.0–15.0)
Immature Granulocytes: 0 %
Lymphocytes Relative: 46 %
Lymphs Abs: 1.2 10*3/uL (ref 0.7–4.0)
MCH: 29.2 pg (ref 26.0–34.0)
MCHC: 33.3 g/dL (ref 30.0–36.0)
MCV: 87.7 fL (ref 80.0–100.0)
Monocytes Absolute: 0.5 10*3/uL (ref 0.1–1.0)
Monocytes Relative: 17 %
Neutro Abs: 0.9 10*3/uL — ABNORMAL LOW (ref 1.7–7.7)
Neutrophils Relative %: 33 %
Platelet Count: 93 10*3/uL — ABNORMAL LOW (ref 150–400)
RBC: 3.49 MIL/uL — ABNORMAL LOW (ref 3.87–5.11)
RDW: 15 % (ref 11.5–15.5)
WBC Count: 2.6 10*3/uL — ABNORMAL LOW (ref 4.0–10.5)
nRBC: 0 % (ref 0.0–0.2)

## 2021-03-24 LAB — CMP (CANCER CENTER ONLY)
ALT: 15 U/L (ref 0–44)
AST: 29 U/L (ref 15–41)
Albumin: 3.4 g/dL — ABNORMAL LOW (ref 3.5–5.0)
Alkaline Phosphatase: 166 U/L — ABNORMAL HIGH (ref 38–126)
Anion gap: 9 (ref 5–15)
BUN: 14 mg/dL (ref 6–20)
CO2: 26 mmol/L (ref 22–32)
Calcium: 9.1 mg/dL (ref 8.9–10.3)
Chloride: 104 mmol/L (ref 98–111)
Creatinine: 0.73 mg/dL (ref 0.44–1.00)
GFR, Estimated: >60 mL/min (ref >60)
Glucose, Bld: 154 mg/dL — ABNORMAL HIGH (ref 70–99)
Potassium: 4.1 mmol/L (ref 3.5–5.1)
Sodium: 139 mmol/L (ref 135–145)
Total Bilirubin: 0.5 mg/dL (ref 0.3–1.2)
Total Protein: 7.6 g/dL (ref 6.5–8.1)

## 2021-03-24 LAB — PREGNANCY, URINE: Preg Test, Ur: NEGATIVE

## 2021-03-24 MED ORDER — SODIUM CHLORIDE 0.9 % IV SOLN
2400.0000 mg/m2 | INTRAVENOUS | Status: DC
  Administered 2021-03-24: 4300 mg via INTRAVENOUS
  Filled 2021-03-24: qty 86

## 2021-03-24 MED ORDER — ACETAMINOPHEN 325 MG PO TABS
650.0000 mg | ORAL_TABLET | Freq: Once | ORAL | Status: AC
  Administered 2021-03-24: 650 mg via ORAL
  Filled 2021-03-24: qty 2

## 2021-03-24 MED ORDER — DEXTROSE 5 % IV SOLN: Freq: Once | INTRAVENOUS | Status: AC

## 2021-03-24 MED ORDER — PROCHLORPERAZINE EDISYLATE 10 MG/2ML IJ SOLN
10.0000 mg | Freq: Once | INTRAMUSCULAR | Status: AC
  Administered 2021-03-24: 10 mg via INTRAVENOUS
  Filled 2021-03-24: qty 2

## 2021-03-24 MED ORDER — ONDANSETRON 4 MG PO TBDP
4.0000 mg | ORAL_TABLET | Freq: Four times a day (QID) | ORAL | 1 refills | Status: DC | PRN
Start: 2021-03-24 — End: 2021-06-28

## 2021-03-24 MED ORDER — SODIUM CHLORIDE 0.9% FLUSH: 10.0000 mL | INTRAVENOUS | Status: DC | PRN

## 2021-03-24 MED ORDER — HEPARIN SOD (PORK) LOCK FLUSH 100 UNIT/ML IV SOLN: 500.0000 [IU] | Freq: Once | INTRAVENOUS | Status: DC | PRN

## 2021-03-24 MED ORDER — LEUCOVORIN CALCIUM INJECTION 350 MG
400.0000 mg/m2 | Freq: Once | INTRAVENOUS | Status: AC
  Administered 2021-03-24: 716 mg via INTRAVENOUS
  Filled 2021-03-24: qty 35.8

## 2021-03-24 MED ORDER — DEXAMETHASONE SODIUM PHOSPHATE 100 MG/10ML IJ SOLN
10.0000 mg | Freq: Once | INTRAMUSCULAR | Status: AC
  Administered 2021-03-24: 10 mg via INTRAVENOUS
  Filled 2021-03-24: qty 10

## 2021-03-24 MED ORDER — PALONOSETRON HCL INJECTION 0.25 MG/5ML
0.2500 mg | Freq: Once | INTRAVENOUS | Status: AC
  Administered 2021-03-24: 0.25 mg via INTRAVENOUS
  Filled 2021-03-24: qty 5

## 2021-03-24 MED ORDER — OXALIPLATIN CHEMO INJECTION 100 MG/20ML
85.0000 mg/m2 | Freq: Once | INTRAVENOUS | Status: AC
  Administered 2021-03-24: 150 mg via INTRAVENOUS
  Filled 2021-03-24: qty 20

## 2021-03-24 MED ORDER — SODIUM CHLORIDE 0.9% FLUSH
10.0000 mL | Freq: Once | INTRAVENOUS | Status: AC
  Administered 2021-03-24: 10 mL

## 2021-03-24 NOTE — Progress Notes (Signed)
Neulasta Onpro orders changed to Neulasta injection for remaining treatments per MD request. Patient is approved for either formulation.  Raul Del [place], [place], BCPS, BCOP 03/24/2021 12:07 PM

## 2021-03-24 NOTE — Patient Instructions (Signed)
[facility] ONCOLOGY  Discharge Instructions: Thank you for choosing [facility] to provide your oncology and hematology care.   If you have a lab appointment with the [facility], please go directly to the [facility] and check in at the registration area.   Wear comfortable clothing and clothing appropriate for easy access to any Portacath or PICC line.   We strive to give you quality time with your provider. You may need to reschedule your appointment if you arrive late (15 or more minutes).  Arriving late affects you and other patients whose appointments are after yours.  Also, if you miss three or more appointments without notifying the office, you may be dismissed from the clinic at the providers discretion.      For prescription refill requests, have your pharmacy contact our office and allow 72 hours for refills to be completed.    Today you received the following chemotherapy and/or immunotherapy agents: oxaliplatin, leucovorin, and fluorouracil      To help prevent nausea and vomiting after your treatment, we encourage you to take your nausea medication as directed.  BELOW ARE SYMPTOMS THAT SHOULD BE REPORTED IMMEDIATELY: *FEVER GREATER THAN 100.4 F (38 C) OR HIGHER *CHILLS OR SWEATING *NAUSEA AND VOMITING THAT IS NOT CONTROLLED WITH YOUR NAUSEA MEDICATION *UNUSUAL SHORTNESS OF BREATH *UNUSUAL BRUISING OR BLEEDING *URINARY PROBLEMS (pain or burning when urinating, or frequent urination) *BOWEL PROBLEMS (unusual diarrhea, constipation, pain near the anus) TENDERNESS IN MOUTH AND THROAT WITH OR WITHOUT PRESENCE OF ULCERS (sore throat, sores in mouth, or a toothache) UNUSUAL RASH, SWELLING OR PAIN  UNUSUAL VAGINAL DISCHARGE OR ITCHING   Items with * indicate a potential emergency and should be followed up as soon as possible or go to the Emergency Department if any problems should occur.  Please show the CHEMOTHERAPY ALERT CARD or  IMMUNOTHERAPY ALERT CARD at check-in to the Emergency Department and triage nurse.  Should you have questions after your visit or need to cancel or reschedule your appointment, please contact [facility]  Dept: [phone]  and follow the prompts.  Office hours are 8:00 a.m. to 4:30 p.m. Monday - Friday. Please note that voicemails left after 4:00 p.m. may not be returned until the following business day.  We are closed weekends and major holidays. You have access to a nurse at all times for urgent questions. Please call the main number to the clinic Dept: [phone] and follow the prompts.   For any non-urgent questions, you may also contact your provider using MyChart. We now offer e-Visits for anyone 66 and older to request care online for non-urgent symptoms. For details visit [email or web address].   Also download the MyChart app! Go to the app store, search "MyChart", open the app, select , and log in with your MyChart username and password.  Due to Covid, a mask is required upon entering the hospital/clinic. If you do not have a mask, one will be given to you upon arrival. For doctor visits, patients may have 1 support person aged 55 or older with them. For treatment visits, patients cannot have anyone with them due to current Covid guidelines and our immunocompromised population.

## 2021-03-24 NOTE — Progress Notes (Signed)
Per [name], "OK To Treat w/ANC 0.9. HR 109, and plts 93" today.

## 2021-03-24 NOTE — Progress Notes (Signed)
[facility] [place]   [phone] [phone]   Clinic Follow up Note   Patient Care Team: [name], MD as PCP - General (Family Medicine) [name], MD as Consulting Physician (Oncology) [name], RN (Inactive) as Oncology Nurse Navigator  Date of Service:  03/24/2021  CHIEF COMPLAINT: f/u of intrahepatic cholangiocarcinoma  CURRENT THERAPY:  Second line FOLFOX, starting 01/27/21  ASSESSMENT & PLAN:  Crystal Gibbs is a 45 y.o. female with   1. 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 -Her Liver biopsy from 07/26/20 showed adenocarcinoma. There is possibility her primary cancer is elsewhere, although imaging did not show other malignancy outside of liver. -Her 07/10/20 MRI abdomen also shows there is at least 1 satellite lesion centrally in the right lobe and her 07/30/20 CT Chest shows Pulmonary nodules are highly worrisome for metastatic disease.  -Her Upper endoscopy by Dr [name] on 08/18/20 did not show malignancy.  -I started her on chemo with First line gemcitabine and cisplatin 2 weeks on/1 week off in addition to Durvalumab q3weeks based on recent TOPAZ1 trial data. She started on 08/16/20.  -Abdominal MRI from 01/08/21 showed: stable primary tumor in the liver, however she has developed a more satellite lesions (at least 4 now), which is consistent with disease progression.  No other new distant metastasis on scan. -Foundation One was requested on the initial liver biopsy and showed MSI stable and no other targetable mutations. -she was switched to second line FOLFOX on 01/27/21. She feels she is tolerating this better than her prior treatment. -labs reviewed, overall adequate to proceed with treatment today. -will add  Neulasta on day 3 due to neutropenia  -Her CA19.9 has been trending up, concerning for cancer progression  -we will plan to repeat scan before her next cycle, after Christmas.  2. Goal of care discussion  -We again discussed the incurable nature of her cancer, and the overall poor prognosis, especially if she does not have good response to chemotherapy or progress on chemo -They had some questions regarding treatment and clinical trials. I advised her that she may not be eligible due to not being fluent in [place]. I discussed that this is often a requirement to ensure the patient fully understands the study, the medicine, and the risks.   3. Abdominal Pain, Hepatomegaly, acid reflux -secondary to #1 -She had abdominal pain 2 months before diagnosis. The pain is not daily (mostly with eating and prolonged sitting) but discomfort is mostly daily. -Pain started while she was in [place] and work up was concerning for malignancy. She chose to return to the [place] for more workup.  -Pain has much improved since she started chemo. She is now only using Tylenol but does not need it much for the abdominal pain. -continue protonix    4. Right shoulder pain -She notes right shoulder/scapula pain rated 5-6/10. She previously reported the pain radiated down her arm -CT scan was negative for chest wall mass or bone lesions, her pain could be muscular pain in nature   5. Social and Acupuncturist -She lives in [place] with her husband, although she was recently frequently in [place]. She is a [place] Citizen. -She and her husband does not speak much [place]. They require interpretor, one of whom they listed as a  point of contact. They have a niece that lives nearby who speaks [place]. -She is not working. He has job, but not currently working, as to help his wife. -She has [employer] health insurance. She does not have a PCP currently in the [place]. -Continue f/u with SW and financial advocate for support and to possibly  apply for Medicare/Medicaid.   6. Genetic testing -Given her age and family history of GI cancer in her mother, she is eligible for genetic testing.  -Results negative with VUS in SDHA and RECQL4.   7. Indeterminate lung nodules -initially seen on chest CT 07/30/20, most are 10m or less  -increased size of medial right lung base nodule on 11/04/20 scan, other nodules stable. -stable to decrease in size of nodules on 12/27/20 CT.     PLAN:  -proceed with FOLFOX today at same dose with Neulasta on day 3  -f/u and FOLFOX in 2 weeks with lab/flush and restaging scans several days before   No problem-specific Assessment & Plan notes found for this encounter.   SUMMARY OF ONCOLOGIC HISTORY: Oncology History Overview Note  Cancer Staging Intrahepatic [place]angiocarcinoma ([place] Staging form: Intrahepatic Bile Duct, AJCC 8th Edition - Clinical: Stage IB (cT1b, cN0, cM0) - Signed by FTruitt Merle MD on 08/04/2020    [place]ocarcinoma ([place]  08/19/2019 Procedure   Upper Endoscopy by Dr CBryan Lemma IMPRESSION - Normal esophagus. - Z-line regular, 35 cm from the incisors. - Gastritis. Biopsied. - Normal incisura, antrum and pylorus. - Erythematous duodenopathy. Biopsied. - Normal second portion of the duodenum and third portion of the duodenum. - No evidence of primary malignancy site noted on this study.   07/10/2020 Imaging   [place] 07/10/20 IMPRESSION: 1.  No acute hepatobiliary findings.   2. Heterogeneous liver echotexture with 3 masslike areas as described measuring 4.3 cm, 5.4 cm and 6.4 cm respectively. Recommend CT abdomen/pelvis with intravenous contrast for further evaluation.   07/10/2020 Imaging   CT AP 07/10/20  IMPRESSION: 1. There is a large masslike collection or mass in the central liver measuring at least 10.9 x 7.1 x 10.2 cm with 2 smaller adjacent satellite lesions/collections measuring 11 and 17 mm. These findings may represent a large abscess with  satellite abscesses or a large neoplasm/malignancy. Recommend MRI for further evaluation. 2. Left renal cyst. 3. Calcified atherosclerosis in the distal abdominal aorta is mild. 4. Fibroid uterus.   07/10/2020 Imaging   MRI Abdomen 07/10/20  IMPRESSION: 1. The large central hepatic mass does not demonstrate signal characteristics or enhancement typical of a hemangioma or abscess, and is suspicious for malignancy. There is at least 1 satellite lesion centrally in the right lobe. As there are no signs of underlying cirrhosis, primary considerations include peripheral cholangiocarcinoma and metastatic disease. Tissue sampling recommended. 2. Mild intrahepatic biliary dilatation within the left hepatic lobe. Distortion of the hepatic vasculature without evidence of tumor thrombus. 3. No extrahepatic metastatic disease or primary malignancy identified within the abdomen.   07/26/2020 Imaging   CT AP 07/26/20  IMPRESSION: 1. Markedly poorly visualized and ill-defined 12 cm lesion within the liver. This lesion is better evaluated on MR abdomen 07/10/2020. 2. Otherwise no acute intra-abdominal abnormality with markedly limited evaluation on this noncontrast study. 3.  Aortic Atherosclerosis (ICD10-I70.0).   07/26/2020 Initial Diagnosis   FINAL MICROSCOPIC DIAGNOSIS: 07/26/20  A. LIVER MASS, LEFT, NEEDLE CORE BIOPSY:  - Adenocarcinoma.   ADDENDUM: The adenocarcinoma is positive with immunohistochemistry for cytokeratin 7 and PAX 8.  The carcinoma  is negative with cytokeratin 20, CDX 2, estrogen receptor, GATA3, GCDFP, monoclonal and polyclonal CEA, cytokeratin 5/6, TTF-1, Napsin A, HepPar1, arginase 1 and WT1.  The morphology and immunophenotype are consistent with adenocarcinoma.  The differential for possible primary sites is broad including kidney although the morphology is not typical for more common types of renal cell carcinoma.  Pancreaticobiliary and gynecologic cannot be ruled out.     07/30/2020 Imaging   CT Chest 07/30/20  IMPRESSION: 1. Pulmonary nodules are highly worrisome for metastatic disease. 2. Hepatic masses are consistent with biopsy-proven adenocarcinoma and better evaluated on MR abdomen 07/10/2020.   08/04/2020 Initial Diagnosis   Intrahepatic cholangiocarcinoma ([facility])   08/04/2020 Cancer Staging   Staging form: Intrahepatic Bile Duct, AJCC 8th Edition - Clinical: Stage IB (cT1b, cN0, cM0) - Signed by [name], MD on 08/04/2020    08/13/2020 Procedure   PAC placement   08/16/2020 -  Chemotherapy   First line gemcitabine and cisplatin 2 weeks on/1 week off starting 08/16/20     08/16/2020 -  Chemotherapy   -Addition of durvalumab q3weeks with first-line chemo starting 08/16/20    08/18/2020 Procedure   Upper Endoscopy by Dr [name] IMPRESSION - Normal esophagus. - Z-line regular, 35 cm from the incisors. - Gastritis. Biopsied. - Normal incisura, antrum and pylorus. - Erythematous duodenopathy. Biopsied. - Normal second portion of the duodenum and third portion of the duodenum. - No evidence of primary malignancy site noted on this study.   08/18/2020 Pathology Results   FINAL MICROSCOPIC DIAGNOSIS:   A. DUODENUM, BULB, BIOPSY:  -  Erosive and reactive duodenal mucosa with Brunner's gland hyperplasia and mixed inflammation  -  No dysplasia or malignancy identified   B. STOMACH, BIOPSY:  -  Chronic active gastritis  -  H. pylori organisms present  -  No intestinal metaplasia identified  -  See comment   COMMENT:  Warthin-Starry stain is POSITIVE for organisms consistent with  Helicobacter pylori.    09/08/2020 Genetic Testing   Negative hereditary cancer genetic testing: no pathogenic variants detected in [facility] Multi-Cancer Panel + Pancreatitis Genes.  Variants of uncertain significance detected in RECQL4 at c.119-20C>A (Intronic) and SDHA at c.1039A>G (p.Met347Val).  The report date is September 08, 2020.    The Multi-Cancer Panel with  pancreatitis genes offered by [facility] includes sequencing and/or deletion duplication testing of the following 89 genes: AIP, ALK, APC, ATM, AXIN2,BAP1,  BARD1, BLM, BMPR1A, BRCA1, BRCA2, BRIP1, CASR, CDC73, CDH1, CDK4, CDKN1B, CDKN1C, CDKN2A (p14ARF), CDKN2A (p16INK4a), CEBPA, CFTR, CHEK2, CPA1, CTNNA1, CTRC, DICER1, DIS3L2, EGFR (c.2369C>T, p.Thr790Met variant only), EPCAM (Deletion/duplication testing only), FH, FLCN, GATA2, GPC3, GREM1 (Promoter region deletion/duplication testing only), HOXB13 (c.251G>A, p.Gly84Glu), HRAS, KIT, MAX, MEN1, MET, MITF (c.952G>A, p.Glu318Lys variant only), MLH1, MSH2, MSH3, MSH6, MUTYH, NBN, NF1, NF2, NTHL1, PALB2, PDGFRA, PHOX2B, PMS2, POLD1, POLE, POT1, PRKAR1A, PRSS1, PTCH1, PTEN, RAD50, RAD51C, RAD51D, RB1, RECQL4, RET, RNF43, RUNX1, SDHAF2, SDHA (sequence changes only), SDHB, SDHC, SDHD, SMAD4, SMARCA4, SMARCB1, SMARCE1, SPINK1, STK11, SUFU, TERC, TERT, TMEM127, TP53, TSC1, TSC2, VHL, WRN and WT1.    11/04/2020 Imaging   CT CAP  IMPRESSION: 1. Redemonstrated large, hypoenhancing mass of the central right lobe of the liver, which is stable or perhaps slightly enlarged compared to prior examination, measuring 11.5 x 7.0 cm, previously 10.9 x 7.1 cm when measured similarly. Small satellite lesions appear to have enlarged in the interval and there is new tumor involvement of the caudate, overall constellation of findings consistent with worsened cholangiocarcinoma. 2.  There is segmental biliary ductal dilatation, primarily seen in the left lobe of the liver, not significantly changed compared to prior examination. The portal veins remain patent. 3. Interval enlargement of a nodule of the medial right lung base, consistent with worsened pulmonary metastatic disease. Additional small nodules are essentially stable and although technically nonspecific remain worrisome for additional small metastases. 4. Aortic atherosclerosis, advanced for patient age and  gender. 5. Uterine fibroids.   12/27/2020 Imaging   CT Chest w/o contrast  IMPRESSION: Solid pulmonary nodules again seen, most are decreased in size compared to prior, a few are stable.   Hypoattenuating mass in the central right lobe of the liver appear similar to prior exam, although evaluation is limited due to lack of IV contrast.   Aortic Atherosclerosis (ICD10-I70.0).   01/27/2021 -  Chemotherapy   Patient is on Treatment Plan : COLORECTAL FOLFOX q14d        INTERVAL HISTORY:  Crystal Gibbs is here for a follow up of intrahepatic cholangiocarcinoma. She was last seen by me on 03/10/21. She presents to the clinic accompanied by her niece. She reports the pain in her abdomen will worsen when she eats too much. She also reports she gets headaches for a few days after chemo.   All other systems were reviewed with the patient and are negative.  MEDICAL HISTORY:  Past Medical History:  Diagnosis Date   Cancer [facility])    Family history of breast cancer 08/23/2020   Family history of pancreatic cancer 08/23/2020   Family history of prostate cancer 08/23/2020   Family history of stomach cancer 08/23/2020   Fibroid    History of multiple miscarriages    x 3   Hypertension     SURGICAL HISTORY: Past Surgical History:  Procedure Laterality Date   BIOPSY  08/18/2020   Procedure: BIOPSY;  Surgeon: Lavena Bullion, DO;  Location: [facility];  Service: Gastroenterology;;   CATARACT EXTRACTION Bilateral    ESOPHAGOGASTRODUODENOSCOPY (EGD) WITH PROPOFOL N/A 08/18/2020   Procedure: ESOPHAGOGASTRODUODENOSCOPY (EGD) WITH PROPOFOL;  Surgeon: Lavena Bullion, DO;  Location: [facility];  Service: Gastroenterology;  Laterality: N/A;   IR IMAGING GUIDED PORT INSERTION  08/13/2020   PARATHYROIDECTOMY Right 02/19/15    I have reviewed the social history and family history with the patient and they are unchanged from previous note.  ALLERGIES:  has No Known Allergies.  MEDICATIONS:   Current Outpatient Medications  Medication Sig Dispense Refill   amLODipine (NORVASC) 2.5 MG tablet Take 1 tablet (2.5 mg total) by mouth daily. 90 tablet 1   dexamethasone (DECADRON) 4 MG tablet Take 2 tablets at breakfast for 3 days, starting the day after cisplatin 40 tablet 2   diclofenac Sodium (VOLTAREN) 1 % GEL Apply 4 g topically 4 (four) times daily. 50 g 2   magnesium oxide (MAG-OX) 400 (240 Mg) MG tablet Take 1 tablet (400 mg total) by mouth daily. 30 tablet 3   ondansetron (ZOFRAN ODT) 4 MG disintegrating tablet Take 1-2 tablets (4-8 mg total) by mouth every 6 (six) hours as needed for nausea or vomiting. 30 tablet 1   pantoprazole (PROTONIX) 40 MG tablet Take 1 tablet (40 mg total) by mouth daily. 30 tablet 3   polyethylene glycol powder (GLYCOLAX/MIRALAX) 17 GM/SCOOP powder Take 17 g by mouth 2 (two) times daily as needed. 3350 g 1   No current facility-administered medications for this visit.   Facility-Administered Medications Ordered in Other Visits  Medication Dose Route Frequency Provider Last  Rate Last Admin   acetaminophen (TYLENOL) 325 MG tablet            magnesium sulfate 2 GM/50ML IVPB            magnesium sulfate 2 GM/50ML IVPB            palonosetron (ALOXI) 0.25 MG/5ML injection             PHYSICAL EXAMINATION: ECOG PERFORMANCE STATUS: 2 - Symptomatic, <50% confined to bed  Vitals:   03/24/21 0956  BP: (!) 138/96  Pulse: (!) 105  Resp: 16  Temp: 98.4 F (36.9 C)  SpO2: 100%   Wt Readings from Last 3 Encounters:  03/24/21 152 lb 14.4 oz (69.4 kg)  03/10/21 152 lb 4.8 oz (69.1 kg)  02/24/21 152 lb 9 oz (69.2 kg)     GENERAL:alert, no distress and comfortable SKIN: skin color normal, no rashes or significant lesions EYES: normal, Conjunctiva are pink and non-injected, sclera clear  NEURO: alert & oriented x 3 with fluent speech  LABORATORY DATA:  I have reviewed the data as listed CBC Latest Ref Rng & Units 03/24/2021 03/10/2021 02/24/2021  WBC  4.0 - 10.5 K/uL 2.6(L) 3.7(L) 4.1  Hemoglobin 12.0 - 15.0 g/dL 10.2(L) 10.4(L) 10.1(L)  Hematocrit 36.0 - 46.0 % 30.6(L) 32.0(L) 30.6(L)  Platelets 150 - 400 K/uL 93(L) 112(L) 97(L)     CMP Latest Ref Rng & Units 03/24/2021 03/10/2021 02/24/2021  Glucose 70 - 99 mg/dL 154(H) 171(H) 183(H)  BUN 6 - 20 mg/dL 14 13 16   Creatinine 0.44 - 1.00 mg/dL 0.73 0.75 0.75  Sodium 135 - 145 mmol/L 139 140 139  Potassium 3.5 - 5.1 mmol/L 4.1 3.8 4.0  Chloride 98 - 111 mmol/L 104 104 105  CO2 22 - 32 mmol/L 26 26 24   Calcium 8.9 - 10.3 mg/dL 9.1 9.4 9.1  Total Protein 6.5 - 8.1 g/dL 7.6 7.5 7.2  Total Bilirubin 0.3 - 1.2 mg/dL 0.5 0.5 0.4  Alkaline Phos 38 - 126 U/L 166(H) 188(H) 163(H)  AST 15 - 41 U/L 29 31 27   ALT 0 - 44 U/L 15 17 16       RADIOGRAPHIC STUDIES: I have personally reviewed the radiological images as listed and agreed with the findings in the report. No results found.    Orders Placed This Encounter  Procedures   MR Abdomen W Wo Contrast    Standing Status:   Future    Standing Expiration Date:   03/24/2022    Order Specific Question:   If indicated for the ordered procedure, I authorize the administration of contrast media per Radiology protocol    Answer:   Yes    Order Specific Question:   What is the patient's sedation requirement?    Answer:   No Sedation    Order Specific Question:   Does the patient have a pacemaker or implanted devices?    Answer:   No    Order Specific Question:   Preferred imaging location?    Answer:   [facility] (table limit - 550 lbs)   CT Chest Wo Contrast    Standing Status:   Future    Standing Expiration Date:   03/24/2022    Order Specific Question:   Is patient pregnant?    Answer:   No    Order Specific Question:   Preferred imaging location?    Answer:   [facility]   All questions were answered. The patient knows to  call the clinic with any problems, questions or concerns. No barriers to learning was  detected. The total time spent in the appointment was 30 minutes.     [name], MD 03/24/2021   I, Wilburn Mylar, am acting as scribe for [name], MD.   I have reviewed the above documentation for accuracy and completeness, and I agree with the above.

## 2021-03-24 NOTE — Progress Notes (Signed)
Per [name], ok to treat with platelets 93 and heart rate 105.

## 2021-03-25 ENCOUNTER — Other Ambulatory Visit: Payer: Self-pay

## 2021-03-25 LAB — CANCER ANTIGEN 19-9: CA 19-9: 179 U/mL — ABNORMAL HIGH (ref 0–35)

## 2021-03-26 ENCOUNTER — Inpatient Hospital Stay: Payer: 59

## 2021-03-26 ENCOUNTER — Other Ambulatory Visit: Payer: Self-pay

## 2021-03-26 VITALS — BP 121/84 | HR 121 | Temp 97.5°F | Resp 18

## 2021-03-26 DIAGNOSIS — Z5111 Encounter for antineoplastic chemotherapy: Secondary | ICD-10-CM | POA: Diagnosis not present

## 2021-03-26 DIAGNOSIS — C221 Intrahepatic bile duct carcinoma: Secondary | ICD-10-CM

## 2021-03-26 DIAGNOSIS — Z95828 Presence of other vascular implants and grafts: Secondary | ICD-10-CM

## 2021-03-26 MED ORDER — HEPARIN SOD (PORK) LOCK FLUSH 100 UNIT/ML IV SOLN
500.0000 [IU] | Freq: Once | INTRAVENOUS | Status: AC
  Administered 2021-03-26: 500 [IU]

## 2021-03-26 MED ORDER — SODIUM CHLORIDE 0.9% FLUSH
10.0000 mL | Freq: Once | INTRAVENOUS | Status: AC
  Administered 2021-03-26: 10 mL

## 2021-03-26 MED ORDER — PEGFILGRASTIM INJECTION 6 MG/0.6ML ~~LOC~~
6.0000 mg | PREFILLED_SYRINGE | Freq: Once | SUBCUTANEOUS | Status: DC
  Filled 2021-03-26: qty 0.6

## 2021-03-26 MED ORDER — PEGFILGRASTIM-CBQV 6 MG/0.6ML ~~LOC~~ SOSY
6.0000 mg | PREFILLED_SYRINGE | Freq: Once | SUBCUTANEOUS | Status: DC
  Administered 2021-03-26: 6 mg via SUBCUTANEOUS

## 2021-03-29 ENCOUNTER — Telehealth: Payer: Self-pay | Admitting: Hematology

## 2021-03-29 NOTE — Telephone Encounter (Signed)
Scheduled follow-up appointments per 12/15 los. Patient is aware. °

## 2021-04-05 ENCOUNTER — Other Ambulatory Visit: Payer: Self-pay

## 2021-04-05 ENCOUNTER — Ambulatory Visit (HOSPITAL_COMMUNITY)
Admission: RE | Admit: 2021-04-05 | Discharge: 2021-04-05 | Disposition: A | Discharge status: Home / Self Care | Payer: 59 | Source: Ambulatory Visit | Attending: Hematology | Admitting: Hematology

## 2021-04-05 ENCOUNTER — Other Ambulatory Visit: Payer: Self-pay | Admitting: Hematology

## 2021-04-05 ENCOUNTER — Encounter (HOSPITAL_COMMUNITY): Payer: Self-pay

## 2021-04-05 DIAGNOSIS — C221 Intrahepatic bile duct carcinoma: Secondary | ICD-10-CM

## 2021-04-05 MED ORDER — GADOBUTROL 1 MMOL/ML IV SOLN
7.0000 mL | Freq: Once | INTRAVENOUS | Status: AC | PRN
  Administered 2021-04-05: 20:00:00 7 mL via INTRAVENOUS

## 2021-04-07 ENCOUNTER — Encounter: Payer: Self-pay | Admitting: Hematology

## 2021-04-07 ENCOUNTER — Inpatient Hospital Stay: Payer: 59

## 2021-04-07 ENCOUNTER — Other Ambulatory Visit: Payer: Self-pay

## 2021-04-07 ENCOUNTER — Inpatient Hospital Stay (HOSPITAL_BASED_OUTPATIENT_CLINIC_OR_DEPARTMENT_OTHER): Payer: 59 | Admitting: Hematology

## 2021-04-07 VITALS — BP 145/100 | HR 104 | Temp 98.3°F | Resp 18 | Ht 66.0 in | Wt 152.1 lb

## 2021-04-07 DIAGNOSIS — C221 Intrahepatic bile duct carcinoma: Secondary | ICD-10-CM

## 2021-04-07 DIAGNOSIS — Z5111 Encounter for antineoplastic chemotherapy: Secondary | ICD-10-CM | POA: Diagnosis not present

## 2021-04-07 DIAGNOSIS — Z95828 Presence of other vascular implants and grafts: Secondary | ICD-10-CM

## 2021-04-07 LAB — CMP (CANCER CENTER ONLY)
ALT: 14 U/L (ref 0–44)
AST: 32 U/L (ref 15–41)
Albumin: 3.7 g/dL (ref 3.5–5.0)
Alkaline Phosphatase: 169 U/L — ABNORMAL HIGH (ref 38–126)
Anion gap: 7 (ref 5–15)
BUN: 13 mg/dL (ref 6–20)
CO2: 28 mmol/L (ref 22–32)
Calcium: 9.2 mg/dL (ref 8.9–10.3)
Chloride: 102 mmol/L (ref 98–111)
Creatinine: 0.73 mg/dL (ref 0.44–1.00)
GFR, Estimated: >60 mL/min (ref >60)
Glucose, Bld: 176 mg/dL — ABNORMAL HIGH (ref 70–99)
Potassium: 3.9 mmol/L (ref 3.5–5.1)
Sodium: 137 mmol/L (ref 135–145)
Total Bilirubin: 0.4 mg/dL (ref 0.3–1.2)
Total Protein: 7.5 g/dL (ref 6.5–8.1)

## 2021-04-07 LAB — CBC WITH DIFFERENTIAL (CANCER CENTER ONLY)
Abs Immature Granulocytes: 0.05 10*3/uL (ref 0.00–0.07)
Basophils Absolute: 0 10*3/uL (ref 0.0–0.1)
Basophils Relative: 0 %
Eosinophils Absolute: 0 10*3/uL (ref 0.0–0.5)
Eosinophils Relative: 1 %
HCT: 30.3 % — ABNORMAL LOW (ref 36.0–46.0)
Hemoglobin: 10 g/dL — ABNORMAL LOW (ref 12.0–15.0)
Immature Granulocytes: 1 %
Lymphocytes Relative: 26 %
Lymphs Abs: 1.1 10*3/uL (ref 0.7–4.0)
MCH: 29 pg (ref 26.0–34.0)
MCHC: 33 g/dL (ref 30.0–36.0)
MCV: 87.8 fL (ref 80.0–100.0)
Monocytes Absolute: 0.5 10*3/uL (ref 0.1–1.0)
Monocytes Relative: 13 %
Neutro Abs: 2.5 10*3/uL (ref 1.7–7.7)
Neutrophils Relative %: 59 %
Platelet Count: 94 10*3/uL — ABNORMAL LOW (ref 150–400)
RBC: 3.45 MIL/uL — ABNORMAL LOW (ref 3.87–5.11)
RDW: 15.9 % — ABNORMAL HIGH (ref 11.5–15.5)
WBC Count: 4.1 10*3/uL (ref 4.0–10.5)
nRBC: 0 % (ref 0.0–0.2)

## 2021-04-07 LAB — PREGNANCY, URINE: Preg Test, Ur: NEGATIVE

## 2021-04-07 MED ORDER — SODIUM CHLORIDE 0.9% FLUSH
10.0000 mL | Freq: Once | INTRAVENOUS | Status: AC
  Administered 2021-04-07: 12:00:00 10 mL

## 2021-04-07 MED ORDER — LEUCOVORIN CALCIUM INJECTION 350 MG
400.0000 mg/m2 | Freq: Once | INTRAVENOUS | Status: AC
  Administered 2021-04-07: 14:00:00 716 mg via INTRAVENOUS
  Filled 2021-04-07: qty 35.8

## 2021-04-07 MED ORDER — SODIUM CHLORIDE 0.9 % IV SOLN
10.0000 mg | Freq: Once | INTRAVENOUS | Status: AC
  Administered 2021-04-07: 14:00:00 10 mg via INTRAVENOUS
  Filled 2021-04-07: qty 10

## 2021-04-07 MED ORDER — PROCHLORPERAZINE EDISYLATE 10 MG/2ML IJ SOLN
10.0000 mg | Freq: Once | INTRAMUSCULAR | Status: AC
  Administered 2021-04-07: 13:00:00 10 mg via INTRAVENOUS
  Filled 2021-04-07: qty 2

## 2021-04-07 MED ORDER — SODIUM CHLORIDE 0.9 % IV SOLN
2400.0000 mg/m2 | INTRAVENOUS | Status: DC
  Administered 2021-04-07: 17:00:00 4300 mg via INTRAVENOUS
  Filled 2021-04-07: qty 86

## 2021-04-07 MED ORDER — DEXTROSE 5 % IV SOLN: Freq: Once | INTRAVENOUS | Status: AC

## 2021-04-07 MED ORDER — ACETAMINOPHEN 325 MG PO TABS
650.0000 mg | ORAL_TABLET | Freq: Once | ORAL | Status: AC
  Administered 2021-04-07: 13:00:00 650 mg via ORAL
  Filled 2021-04-07: qty 2

## 2021-04-07 MED ORDER — OXALIPLATIN CHEMO INJECTION 100 MG/20ML
85.0000 mg/m2 | Freq: Once | INTRAVENOUS | Status: AC
  Administered 2021-04-07: 14:00:00 150 mg via INTRAVENOUS
  Filled 2021-04-07: qty 20

## 2021-04-07 MED ORDER — PALONOSETRON HCL INJECTION 0.25 MG/5ML
0.2500 mg | Freq: Once | INTRAVENOUS | Status: AC
  Administered 2021-04-07: 13:00:00 0.25 mg via INTRAVENOUS
  Filled 2021-04-07: qty 5

## 2021-04-07 MED ORDER — SODIUM CHLORIDE 0.9% FLUSH: 10.0000 mL | INTRAVENOUS | Status: DC | PRN

## 2021-04-07 NOTE — Patient Instructions (Addendum)
[facility]  Discharge Instructions: Thank you for choosing [facility] to provide your oncology and hematology care.   If you have a lab appointment with the [facility], please go directly to the [facility] and check in at the registration area.   Wear comfortable clothing and clothing appropriate for easy access to any Portacath or PICC line.   We strive to give you quality time with your provider. You may need to reschedule your appointment if you arrive late (15 or more minutes).  Arriving late affects you and other patients whose appointments are after yours.  Also, if you miss three or more appointments without notifying the office, you may be dismissed from the clinic at the providers discretion.      For prescription refill requests, have your pharmacy contact our office and allow 72 hours for refills to be completed.    Today you received the following chemotherapy and/or immunotherapy agents: Oxaliplatin and  fluorouracil      To help prevent nausea and vomiting after your treatment, we encourage you to take your nausea medication as directed.  BELOW ARE SYMPTOMS THAT SHOULD BE REPORTED IMMEDIATELY: *FEVER GREATER THAN 100.4 F (38 C) OR HIGHER *CHILLS OR SWEATING *NAUSEA AND VOMITING THAT IS NOT CONTROLLED WITH YOUR NAUSEA MEDICATION *UNUSUAL SHORTNESS OF BREATH *UNUSUAL BRUISING OR BLEEDING *URINARY PROBLEMS (pain or burning when urinating, or frequent urination) *BOWEL PROBLEMS (unusual diarrhea, constipation, pain near the anus) TENDERNESS IN MOUTH AND THROAT WITH OR WITHOUT PRESENCE OF ULCERS (sore throat, sores in mouth, or a toothache) UNUSUAL RASH, SWELLING OR PAIN  UNUSUAL VAGINAL DISCHARGE OR ITCHING   Items with * indicate a potential emergency and should be followed up as soon as possible or go to the Emergency Department if any problems should occur.  Please show the CHEMOTHERAPY ALERT CARD or IMMUNOTHERAPY ALERT  CARD at check-in to the Emergency Department and triage nurse.  Should you have questions after your visit or need to cancel or reschedule your appointment, please contact [facility]  Dept: [phone]  and follow the prompts.  Office hours are 8:00 a.m. to 4:30 p.m. Monday - Friday. Please note that voicemails left after 4:00 p.m. may not be returned until the following business day.  We are closed weekends and major holidays. You have access to a nurse at all times for urgent questions. Please call the main number to the clinic Dept: [phone] and follow the prompts.   For any non-urgent questions, you may also contact your provider using MyChart. We now offer e-Visits for anyone 79 and older to request care online for non-urgent symptoms. For details visit [email or web address].   Also download the MyChart app! Go to the app store, search "MyChart", open the app, select [facility], and log in with your MyChart username and password.  Due to Covid, a mask is required upon entering the hospital/clinic. If you do not have a mask, one will be given to you upon arrival. For doctor visits, patients may have 1 support person aged 65 or older with them. For treatment visits, patients cannot have anyone with them due to current Covid guidelines and our immunocompromised population.

## 2021-04-07 NOTE — Progress Notes (Signed)
[facility]   [phone] Fax:(336) [phone]   Clinic Follow up Note   Patient Care Team: [name], MD as PCP - General (Family Medicine) [name], MD as Consulting Physician (Oncology) [name], RN (Inactive) as Oncology Nurse Navigator  Date of Service:  04/07/2021  CHIEF COMPLAINT: f/u of intrahepatic cholangiocarcinoma  CURRENT THERAPY:  Second line FOLFOX, starting 01/27/21  ASSESSMENT & PLAN:  Crystal Gibbs is a 45 y.o. female with   1. 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 -Liver biopsy from 07/26/20 showed adenocarcinoma. No image evidence of other primary  -Her 07/10/20 MRI abdomen also shows there is at least 1 satellite lesion centrally in the right lobe and her 07/30/20 CT Chest shows Pulmonary nodules are highly worrisome for metastatic disease. This is most consistent with cholangiocarcinoma  -unfortunately her cancer is not resectable  -Her Upper endoscopy by Dr [name] on 08/18/20 did not show malignancy.  -she started First line gemcitabine and cisplatin 2 weeks on/1 week off, in addition to [employer] based on recent TOPAZ1 trial data, on 08/16/20.  -Abdominal MRI from 01/08/21 showed: stable primary tumor in the liver, however she has developed a more satellite lesions (at least 4 now), which is consistent with disease progression.  No other new distant metastasis on scan. -Foundation One was requested on the initial liver biopsy and showed MSI stable and no other targetable mutations. -she was switched to second line FOLFOX on 01/27/21. She feels she is tolerating this better than her prior treatment. -restaging abdomen MRI and chest CT on 04/05/21 show overall stable disease in liver and slight progression in lungs. I reviewed the images with  them today. I also discussed that her CA 19.9 has been increasing since she switched to FOLFOX. Given overall SD by RECIST citeria, and lack of other good treatment options, I recommend her to continue her current treatment plan for now since she is tolerating well. We will plan for repeat MRI in 2-3 months. -we did also again discuss Y90, which is a local treatment. I plan to present her case to our cancer conference for opinions from other specializations. I will let them know the recommendations. -labs reviewed, overall adequate to proceed with treatment today. -will add Neulasta on day 3 due to neutropenia    2. Goal of care discussion  -We previously discussed the incurable nature of her cancer, and the overall poor prognosis, especially if she does not have good response to chemotherapy or progress on chemo -they are still interested in clinical trials if she is eligible.   3. Abdominal Pain, Hepatosplenomegaly, acid reflux -secondary to #1 -She had abdominal pain 2 months before diagnosis. The pain is not daily (mostly with eating and prolonged sitting) but discomfort is mostly daily. -Pain has much improved since she started chemo. She is now only using Tylenol but does not need it much for the abdominal pain. -continue protonix    4. Right shoulder pain -She notes right shoulder/scapula pain rated 5-6/10. She previously reported the pain radiated down her arm -CT scan was negative for chest wall mass or bone lesions, her pain could be muscular pain in nature -not mentioned today   5. Social and Acupuncturist -She lives in [place] with her husband, although she was recently frequently in [place]. She is a [place] Citizen. -  She and her husband does not speak much [place]. They require interpretor, one of whom they listed as a point of contact. They have a niece that lives nearby who speaks [place]. -She is not working. Her husband has a job, but not currently working, as to help his  wife. -She has [employer] health insurance. She does not have a PCP currently in the [place]. -Continue f/u with SW and financial advocate for support and to possibly apply for Medicare/Medicaid. -they are religious and anticipate going on annual pilgrimage. I am happy to accommodate this for her.    6. Genetic testing -referred given her age and family history of GI cancer in her mother -Results negative with VUS in SDHA and RECQL4.   7. Indeterminate lung nodules -initially seen on chest CT 07/30/20, most are 41m or less  -increased size of medial right lung base nodule on 11/04/20 scan, other nodules stable. -increase in size and number of nodules on 04/05/21 CT.     PLAN:  -CT and MRI scan reviewed, overall SD -proceed with FOLFOX today at same dose -Neulasta on day 3  -lab, flush, f/u, and FOLFOX on 04/21/21 as scheduled -pt and her niece had many questions and I answered the best I can -will review her case in GI conference    No problem-specific Assessment & Plan notes found for this encounter.   SUMMARY OF ONCOLOGIC HISTORY: Oncology History Overview Note  Cancer Staging Intrahepatic cholangiocarcinoma ([facility] Staging form: Intrahepatic Bile Duct, AJCC 8th Edition - Clinical: Stage IB (cT1b, cN0, cM0) - Signed by FTruitt Merle MD on 08/04/2020    Intrahepatic cholangiocarcinoma ([facility] [place]  08/19/2019 Procedure   Upper Endoscopy by Dr CBryan Lemma IMPRESSION - Normal esophagus. - Z-line regular, 35 cm from the incisors. - Gastritis. Biopsied. - Normal incisura, antrum and pylorus. - Erythematous duodenopathy. Biopsied. - Normal second portion of the duodenum and third portion of the duodenum. - No evidence of primary malignancy site noted on this study.   07/10/2020 Imaging   [place] 07/10/20 IMPRESSION: 1.  No acute hepatobiliary findings.   2. Heterogeneous liver echotexture with 3 masslike areas as described measuring 4.3 cm, 5.4 cm and 6.4 cm respectively. Recommend CT  abdomen/pelvis with intravenous contrast for further evaluation.   07/10/2020 Imaging   CT AP 07/10/20  IMPRESSION: 1. There is a large masslike collection or mass in the central liver measuring at least 10.9 x 7.1 x 10.2 cm with 2 smaller adjacent satellite lesions/collections measuring 11 and 17 mm. These findings may represent a large abscess with satellite abscesses or a large neoplasm/malignancy. Recommend MRI for further evaluation. 2. Left renal cyst. 3. Calcified atherosclerosis in the distal abdominal aorta is mild. 4. Fibroid uterus.   07/10/2020 Imaging   MRI Abdomen 07/10/20  IMPRESSION: 1. The large central hepatic mass does not demonstrate signal characteristics or enhancement typical of a hemangioma or abscess, and is suspicious for malignancy. There is at least 1 satellite lesion centrally in the right lobe. As there are no signs of underlying cirrhosis, primary considerations include peripheral cholangiocarcinoma and metastatic disease. Tissue sampling recommended. 2. Mild intrahepatic biliary dilatation within the left hepatic lobe. Distortion of the hepatic vasculature without evidence of tumor thrombus. 3. No extrahepatic metastatic disease or primary malignancy identified within the abdomen.   07/26/2020 Imaging   CT AP 07/26/20  IMPRESSION: 1. Markedly poorly visualized and ill-defined 12 cm lesion within the liver. This lesion is better evaluated on MR abdomen 07/10/2020. 2. Otherwise no  acute intra-abdominal abnormality with markedly limited evaluation on this noncontrast study. 3.  Aortic Atherosclerosis (ICD10-I70.0).   07/26/2020 Initial Diagnosis   FINAL MICROSCOPIC DIAGNOSIS: 07/26/20  A. LIVER MASS, LEFT, NEEDLE CORE BIOPSY:  - Adenocarcinoma.   ADDENDUM: The adenocarcinoma is positive with immunohistochemistry for cytokeratin 7 and PAX 8.  The carcinoma is negative with cytokeratin 20, CDX 2, estrogen receptor, GATA3, GCDFP, monoclonal and polyclonal CEA,  cytokeratin 5/6, TTF-1, Napsin A, HepPar1, arginase 1 and WT1.  The morphology and immunophenotype are consistent with adenocarcinoma.  The differential for possible primary sites is broad including kidney although the morphology is not typical for more common types of renal cell carcinoma.  Pancreaticobiliary and gynecologic cannot be ruled out.    07/30/2020 Imaging   CT Chest 07/30/20  IMPRESSION: 1. Pulmonary nodules are highly worrisome for metastatic disease. 2. Hepatic masses are consistent with biopsy-proven adenocarcinoma and better evaluated on MR abdomen 07/10/2020.   08/04/2020 Initial Diagnosis   Intrahepatic cholangiocarcinoma ([facility])   08/04/2020 Cancer Staging   Staging form: Intrahepatic Bile Duct, AJCC 8th Edition - Clinical: Stage IB (cT1b, cN0, cM0) - Signed by [name], MD on 08/04/2020    08/13/2020 Procedure   PAC placement   08/16/2020 -  Chemotherapy   First line gemcitabine and cisplatin 2 weeks on/1 week off starting 08/16/20     08/16/2020 -  Chemotherapy   -Addition of durvalumab q3weeks with first-line chemo starting 08/16/20    08/18/2020 Procedure   Upper Endoscopy by Dr [name] IMPRESSION - Normal esophagus. - Z-line regular, 35 cm from the incisors. - Gastritis. Biopsied. - Normal incisura, antrum and pylorus. - Erythematous duodenopathy. Biopsied. - Normal second portion of the duodenum and third portion of the duodenum. - No evidence of primary malignancy site noted on this study.   08/18/2020 Pathology Results   FINAL MICROSCOPIC DIAGNOSIS:   A. DUODENUM, BULB, BIOPSY:  -  Erosive and reactive duodenal mucosa with Brunner's gland hyperplasia and mixed inflammation  -  No dysplasia or malignancy identified   B. STOMACH, BIOPSY:  -  Chronic active gastritis  -  H. pylori organisms present  -  No intestinal metaplasia identified  -  See comment   COMMENT:  Warthin-Starry stain is POSITIVE for organisms consistent with  Helicobacter pylori.     09/08/2020 Genetic Testing   Negative hereditary cancer genetic testing: no pathogenic variants detected in [facility] Multi-Cancer Panel + Pancreatitis Genes.  Variants of uncertain significance detected in RECQL4 at c.119-20C>A (Intronic) and SDHA at c.1039A>G (p.Met347Val).  The report date is September 08, 2020.    The Multi-Cancer Panel with pancreatitis genes offered by [facility] includes sequencing and/or deletion duplication testing of the following 89 genes: AIP, ALK, APC, ATM, AXIN2,BAP1,  BARD1, BLM, BMPR1A, BRCA1, BRCA2, BRIP1, CASR, CDC73, CDH1, CDK4, CDKN1B, CDKN1C, CDKN2A (p14ARF), CDKN2A (p16INK4a), CEBPA, CFTR, CHEK2, CPA1, CTNNA1, CTRC, DICER1, DIS3L2, EGFR (c.2369C>T, p.Thr790Met variant only), EPCAM (Deletion/duplication testing only), FH, FLCN, GATA2, GPC3, GREM1 (Promoter region deletion/duplication testing only), HOXB13 (c.251G>A, p.Gly84Glu), HRAS, KIT, MAX, MEN1, MET, MITF (c.952G>A, p.Glu318Lys variant only), MLH1, MSH2, MSH3, MSH6, MUTYH, NBN, NF1, NF2, NTHL1, PALB2, PDGFRA, PHOX2B, PMS2, POLD1, POLE, POT1, PRKAR1A, PRSS1, PTCH1, PTEN, RAD50, RAD51C, RAD51D, RB1, RECQL4, RET, RNF43, RUNX1, SDHAF2, SDHA (sequence changes only), SDHB, SDHC, SDHD, SMAD4, SMARCA4, SMARCB1, SMARCE1, SPINK1, STK11, SUFU, TERC, TERT, TMEM127, TP53, TSC1, TSC2, VHL, WRN and WT1.    11/04/2020 Imaging   CT CAP  IMPRESSION: 1. Redemonstrated large, hypoenhancing mass of the central right  lobe of the liver, which is stable or perhaps slightly enlarged compared to prior examination, measuring 11.5 x 7.0 cm, previously 10.9 x 7.1 cm when measured similarly. Small satellite lesions appear to have enlarged in the interval and there is new tumor involvement of the caudate, overall constellation of findings consistent with worsened cholangiocarcinoma. 2. There is segmental biliary ductal dilatation, primarily seen in the left lobe of the liver, not significantly changed compared to prior examination. The portal veins  remain patent. 3. Interval enlargement of a nodule of the medial right lung base, consistent with worsened pulmonary metastatic disease. Additional small nodules are essentially stable and although technically nonspecific remain worrisome for additional small metastases. 4. Aortic atherosclerosis, advanced for patient age and gender. 5. Uterine fibroids.   12/27/2020 Imaging   CT Chest w/o contrast  IMPRESSION: Solid pulmonary nodules again seen, most are decreased in size compared to prior, a few are stable.   Hypoattenuating mass in the central right lobe of the liver appear similar to prior exam, although evaluation is limited due to lack of IV contrast.   Aortic Atherosclerosis (ICD10-I70.0).   01/27/2021 -  Chemotherapy   Patient is on Treatment Plan : COLORECTAL FOLFOX q14d     04/05/2021 Imaging   EXAM: CT CHEST WITHOUT CONTRAST  IMPRESSION: Interval increased size and number of multiple pulmonary nodules which are consistent with metastases.   04/05/2021 Imaging   EXAM: MRI ABDOMEN WITHOUT AND WITH CONTRAST (INCLUDING MRCP)  IMPRESSION: 1. Grossly stable size and appearance of the dominant heterogeneously enhancing central hepatic/biliary mass measuring up to 8.7 x 10.7 x 11.7 cm. Stable associated intrahepatic biliary ductal obstruction and dilatation, mostly on the left. 2. A few additional smaller enhancing lesions in the right hepatic lobe are again seen, less avidly enhancing and less well defined as on previous study. No definite new sizeable hepatic lesions identified. Limited evaluation due to motion. 3. Hepatosplenomegaly. 4. Stable mildly enlarged portacaval lymph node. 5. A few pulmonary nodular densities in the visualized lower lungs, correlate with chest CT performed the same day.      INTERVAL HISTORY:  Crystal Gibbs is here for a follow up of intrahepatic cholangiocarcinoma. She was last seen by me on 03/24/21. She presents to the clinic  accompanied by her niece. She reports some "heaviness" to her hands, mostly in the mornings, and some fatigue. She notes the fatigue is better than it was. Her niece has looked into clinical trials, which are mostly just data-collecting.   All other systems were reviewed with the patient and are negative.  MEDICAL HISTORY:  Past Medical History:  Diagnosis Date   Cancer [facility])    Family history of breast cancer 08/23/2020   Family history of pancreatic cancer 08/23/2020   Family history of prostate cancer 08/23/2020   Family history of stomach cancer 08/23/2020   Fibroid    History of multiple miscarriages    x 3   Hypertension     SURGICAL HISTORY: Past Surgical History:  Procedure Laterality Date   BIOPSY  08/18/2020   Procedure: BIOPSY;  Surgeon: Lavena Bullion, DO;  Location: [facility];  Service: Gastroenterology;;   CATARACT EXTRACTION Bilateral    ESOPHAGOGASTRODUODENOSCOPY (EGD) WITH PROPOFOL N/A 08/18/2020   Procedure: ESOPHAGOGASTRODUODENOSCOPY (EGD) WITH PROPOFOL;  Surgeon: Lavena Bullion, DO;  Location: [facility];  Service: Gastroenterology;  Laterality: N/A;   IR IMAGING GUIDED PORT INSERTION  08/13/2020   PARATHYROIDECTOMY Right 02/19/15    I have reviewed the social history  and family history with the patient and they are unchanged from previous note.  ALLERGIES:  has No Known Allergies.  MEDICATIONS:  Current Outpatient Medications  Medication Sig Dispense Refill   amLODipine (NORVASC) 2.5 MG tablet Take 1 tablet (2.5 mg total) by mouth daily. 90 tablet 1   dexamethasone (DECADRON) 4 MG tablet Take 2 tablets at breakfast for 3 days, starting the day after cisplatin 40 tablet 2   diclofenac Sodium (VOLTAREN) 1 % GEL Apply 4 g topically 4 (four) times daily. 50 g 2   magnesium oxide (MAG-OX) 400 (240 Mg) MG tablet Take 1 tablet (400 mg total) by mouth daily. 30 tablet 3   ondansetron (ZOFRAN ODT) 4 MG disintegrating tablet Take 1-2 tablets (4-8 mg total)  by mouth every 6 (six) hours as needed for nausea or vomiting. 30 tablet 1   pantoprazole (PROTONIX) 40 MG tablet Take 1 tablet (40 mg total) by mouth daily. 30 tablet 3   polyethylene glycol powder (GLYCOLAX/MIRALAX) 17 GM/SCOOP powder Take 17 g by mouth 2 (two) times daily as needed. 3350 g 1   No current facility-administered medications for this visit.   Facility-Administered Medications Ordered in Other Visits  Medication Dose Route Frequency Provider Last Rate Last Admin   acetaminophen (TYLENOL) 325 MG tablet            fluorouracil (ADRUCIL) 4,300 mg in sodium chloride 0.9 % 64 mL chemo infusion  2,400 mg/m2 (Treatment Plan Recorded) Intravenous 1 day or 1 dose [name], MD   4,300 mg at 04/07/21 1630   magnesium sulfate 2 GM/50ML IVPB            magnesium sulfate 2 GM/50ML IVPB            palonosetron (ALOXI) 0.25 MG/5ML injection            sodium chloride flush (NS) 0.9 % injection 10 mL  10 mL Intracatheter PRN [name], MD        PHYSICAL EXAMINATION: ECOG PERFORMANCE STATUS: 2 - Symptomatic, <50% confined to bed  Vitals:   04/07/21 1226  BP: (!) 145/100  Pulse: (!) 104  Resp: 18  Temp: 98.3 F (36.8 C)  SpO2: 100%   Wt Readings from Last 3 Encounters:  04/07/21 152 lb 1.6 oz (69 kg)  03/24/21 152 lb 14.4 oz (69.4 kg)  03/10/21 152 lb 4.8 oz (69.1 kg)     GENERAL:alert, no distress and comfortable SKIN: skin color normal, no rashes or significant lesions EYES: normal, Conjunctiva are pink and non-injected, sclera clear  NEURO: alert & oriented x 3 with fluent speech  LABORATORY DATA:  I have reviewed the data as listed CBC Latest Ref Rng & Units 04/07/2021 03/24/2021 03/10/2021  WBC 4.0 - 10.5 K/uL 4.1 2.6(L) 3.7(L)  Hemoglobin 12.0 - 15.0 g/dL 10.0(L) 10.2(L) 10.4(L)  Hematocrit 36.0 - 46.0 % 30.3(L) 30.6(L) 32.0(L)  Platelets 150 - 400 K/uL 94(L) 93(L) 112(L)     CMP Latest Ref Rng & Units 04/07/2021 03/24/2021 03/10/2021  Glucose 70 - 99 mg/dL 176(H)  154(H) 171(H)  BUN 6 - 20 mg/dL 13 14 13   Creatinine 0.44 - 1.00 mg/dL 0.73 0.73 0.75  Sodium 135 - 145 mmol/L 137 139 140  Potassium 3.5 - 5.1 mmol/L 3.9 4.1 3.8  Chloride 98 - 111 mmol/L 102 104 104  CO2 22 - 32 mmol/L 28 26 26   Calcium 8.9 - 10.3 mg/dL 9.2 9.1 9.4  Total Protein 6.5 - 8.1 g/dL 7.5 7.6 7.5  Total Bilirubin 0.3 - 1.2 mg/dL 0.4 0.5 0.5  Alkaline Phos 38 - 126 U/L 169(H) 166(H) 188(H)  AST 15 - 41 U/L 32 29 31  ALT 0 - 44 U/L 14 15 17       RADIOGRAPHIC STUDIES: I have personally reviewed the radiological images as listed and agreed with the findings in the report. MR Abdomen W Wo Contrast  Result Date: 04/06/2021 CLINICAL DATA:  Follow-up gallbladder/biliary cancer. EXAM: MRI ABDOMEN WITHOUT AND WITH CONTRAST (INCLUDING MRCP) TECHNIQUE: Multiplanar multisequence MR imaging of the abdomen was performed both before and after the administration of intravenous contrast. Heavily T2-weighted images of the biliary and pancreatic ducts were obtained, and three-dimensional MRCP images were rendered by post processing. CONTRAST:  59m GADAVIST GADOBUTROL 1 MMOL/ML IV SOLN COMPARISON:  MRI abdomen 01/08/2021 FINDINGS: Study is limited due to motion. Lower chest: A few nodular pulmonary signal intensities identified in the bilateral lower lungs measuring approximately 9 mm in the left lower lobe and 10 mm at the right lung base. Hepatobiliary: Liver is enlarged measuring 22 cm in length. Redemonstration of a somewhat lobulated and ill-defined central hepatic mass with irregular and heterogeneous enhancement. The mass measures up to 8.7 x 10.7 x 11.7 cm in AP, transverse and craniocaudal dimensions which is not significantly changed since previous study. A few additional smaller enhancing lesions are again seen in the more peripheral right hepatic lobe which are less avidly enhancing and less well-defined as on previous study, measuring up to 1.5 cm in segment 5, previously 1.7 cm. No  definite new sizeable hepatic mass identified. The dominant large central mass again appears to invade the central biliary ducts with associated intrahepatic biliary ductal dilatation, mostly on the left. Extrahepatic biliary ducts appear normal caliber. Gallbladder appears normal. Pancreas: No mass, inflammatory changes, or other parenchymal abnormality identified. Spleen:  Mildly enlarged measuring 12.9 cm in length. Adrenals/Urinary Tract: Adrenal glands appear grossly normal. Adjacent cysts in the mid left kidney measuring up to 1.4 cm in size. No hydronephrosis visualized bilaterally. Stomach/Bowel: No evidence of bowel obstruction. Vascular/Lymphatic: No aneurysm identified. Stable mildly enlarged portacaval lymph node measuring 11 mm in short axis. Other:  No ascites visualized. Musculoskeletal: No suspicious bony lesions. IMPRESSION: 1. Grossly stable size and appearance of the dominant heterogeneously enhancing central hepatic/biliary mass measuring up to 8.7 x 10.7 x 11.7 cm. Stable associated intrahepatic biliary ductal obstruction and dilatation, mostly on the left. 2. A few additional smaller enhancing lesions in the right hepatic lobe are again seen, less avidly enhancing and less well defined as on previous study. No definite new sizeable hepatic lesions identified. Limited evaluation due to motion. 3. Hepatosplenomegaly. 4. Stable mildly enlarged portacaval lymph node. 5. A few pulmonary nodular densities in the visualized lower lungs, correlate with chest CT performed the same day. Electronically Signed   By: DOfilia NeasM.D.   On: 04/06/2021 13:14   MR 3D Recon At Scanner  Result Date: 04/06/2021 CLINICAL DATA:  Follow-up gallbladder/biliary cancer. EXAM: MRI ABDOMEN WITHOUT AND WITH CONTRAST (INCLUDING MRCP) TECHNIQUE: Multiplanar multisequence MR imaging of the abdomen was performed both before and after the administration of intravenous contrast. Heavily T2-weighted images of the  biliary and pancreatic ducts were obtained, and three-dimensional MRCP images were rendered by post processing. CONTRAST:  752mGADAVIST GADOBUTROL 1 MMOL/ML IV SOLN COMPARISON:  MRI abdomen 01/08/2021 FINDINGS: Study is limited due to motion. Lower chest: A few nodular pulmonary signal intensities identified in the bilateral lower lungs measuring approximately 9  mm in the left lower lobe and 10 mm at the right lung base. Hepatobiliary: Liver is enlarged measuring 22 cm in length. Redemonstration of a somewhat lobulated and ill-defined central hepatic mass with irregular and heterogeneous enhancement. The mass measures up to 8.7 x 10.7 x 11.7 cm in AP, transverse and craniocaudal dimensions which is not significantly changed since previous study. A few additional smaller enhancing lesions are again seen in the more peripheral right hepatic lobe which are less avidly enhancing and less well-defined as on previous study, measuring up to 1.5 cm in segment 5, previously 1.7 cm. No definite new sizeable hepatic mass identified. The dominant large central mass again appears to invade the central biliary ducts with associated intrahepatic biliary ductal dilatation, mostly on the left. Extrahepatic biliary ducts appear normal caliber. Gallbladder appears normal. Pancreas: No mass, inflammatory changes, or other parenchymal abnormality identified. Spleen:  Mildly enlarged measuring 12.9 cm in length. Adrenals/Urinary Tract: Adrenal glands appear grossly normal. Adjacent cysts in the mid left kidney measuring up to 1.4 cm in size. No hydronephrosis visualized bilaterally. Stomach/Bowel: No evidence of bowel obstruction. Vascular/Lymphatic: No aneurysm identified. Stable mildly enlarged portacaval lymph node measuring 11 mm in short axis. Other:  No ascites visualized. Musculoskeletal: No suspicious bony lesions. IMPRESSION: 1. Grossly stable size and appearance of the dominant heterogeneously enhancing central hepatic/biliary  mass measuring up to 8.7 x 10.7 x 11.7 cm. Stable associated intrahepatic biliary ductal obstruction and dilatation, mostly on the left. 2. A few additional smaller enhancing lesions in the right hepatic lobe are again seen, less avidly enhancing and less well defined as on previous study. No definite new sizeable hepatic lesions identified. Limited evaluation due to motion. 3. Hepatosplenomegaly. 4. Stable mildly enlarged portacaval lymph node. 5. A few pulmonary nodular densities in the visualized lower lungs, correlate with chest CT performed the same day. Electronically Signed   By: Ofilia Neas M.D.   On: 04/06/2021 13:14      No orders of the defined types were placed in this encounter.  All questions were answered. The patient knows to call the clinic with any problems, questions or concerns. No barriers to learning was detected. The total time spent in the appointment was 40 minutes.     [name], MD 04/07/2021   I, Wilburn Mylar, am acting as scribe for [name], MD.   I have reviewed the above documentation for accuracy and completeness, and I agree with the above.

## 2021-04-07 NOTE — Progress Notes (Signed)
OK to treat today w/ elevated HR and plt 94 per [name]

## 2021-04-07 NOTE — Progress Notes (Signed)
Ok to speed up pump to finish by 2pm on Saturday per Dr Burr Medico.

## 2021-04-08 LAB — CANCER ANTIGEN 19-9: CA 19-9: 212 U/mL — ABNORMAL HIGH (ref 0–35)

## 2021-04-09 ENCOUNTER — Other Ambulatory Visit: Payer: Self-pay

## 2021-04-09 ENCOUNTER — Inpatient Hospital Stay: Payer: 59

## 2021-04-09 VITALS — BP 130/86 | HR 103 | Temp 99.3°F | Resp 18

## 2021-04-09 DIAGNOSIS — C221 Intrahepatic bile duct carcinoma: Secondary | ICD-10-CM

## 2021-04-09 DIAGNOSIS — Z5111 Encounter for antineoplastic chemotherapy: Secondary | ICD-10-CM | POA: Diagnosis not present

## 2021-04-09 MED ORDER — HEPARIN SOD (PORK) LOCK FLUSH 100 UNIT/ML IV SOLN
500.0000 [IU] | Freq: Once | INTRAVENOUS | Status: AC | PRN
  Administered 2021-04-09: 500 [IU]

## 2021-04-09 MED ORDER — SODIUM CHLORIDE 0.9% FLUSH
10.0000 mL | INTRAVENOUS | Status: DC | PRN
  Administered 2021-04-09: 10 mL

## 2021-04-09 MED ORDER — PEGFILGRASTIM INJECTION 6 MG/0.6ML ~~LOC~~
6.0000 mg | PREFILLED_SYRINGE | Freq: Once | SUBCUTANEOUS | Status: AC
  Administered 2021-04-09: 6 mg via SUBCUTANEOUS
  Filled 2021-04-09: qty 0.6

## 2021-04-09 NOTE — Patient Instructions (Signed)

## 2021-04-12 NOTE — Progress Notes (Signed)
..  Patient Assist/Replace for the following has been terminated. Medication: Neulasta (pegfilgrastim) Reason for Termination: Insurance denial, insurance terminated on 04/09/2021.  Last DOS: 04/09/2021.  Marland KitchenJuan Quam, CPhT IV Drug Replacement Specialist [place] Phone: [phone]

## 2021-04-13 ENCOUNTER — Other Ambulatory Visit: Payer: Self-pay

## 2021-04-13 NOTE — Progress Notes (Signed)
The proposed treatment discussed in conference is for discussion purpose only and is not a binding recommendation.  The patients have not been physically examined, or presented with their treatment options.  Therefore, final treatment plans cannot be decided.  

## 2021-04-14 NOTE — Progress Notes (Signed)
..  Patient is receiving Assistance Medication - Supplied Externally. Medication: Udenyca (pegfilgrastim-cbqv) Manufacture: [employer] Approval Dates: Approved from 04/14/2021 until 04/14/2022. ID: [id number] Reason: Self Pay First DOS: 04/23/2021.  Marland KitchenJuan Quam, CPhT IV Drug Replacement Specialist [place] Phone: [phone]

## 2021-04-20 MED FILL — Dexamethasone Sodium Phosphate Inj 100 MG/10ML: INTRAMUSCULAR | Qty: 1 | Status: AC

## 2021-04-20 NOTE — Progress Notes (Signed)
[facility]   Telephone:([phone]) [phone] Fax:([phone]) [phone]   Clinic Follow up Note   Patient Care Team: [name], MD as PCP - General (Family Medicine) [name], MD as Consulting Physician (Oncology) [name], RN (Inactive) as Oncology Nurse Navigator 04/21/2021  CHIEF COMPLAINT: Follow up intrahepatic cholangiocarcinoma   SUMMARY OF ONCOLOGIC HISTORY: Oncology History Overview Note  Cancer Staging Intrahepatic cholangiocarcinoma ([place]) Staging form: Intrahepatic Bile Duct, AJCC 8th Edition - Clinical: Stage IB (cT1b, cN0, cM0) - Signed by [name], MD on 08/04/2020    Intrahepatic cholangiocarcinoma ([place])  08/19/2019 Procedure   Upper Endoscopy by Dr [name]  IMPRESSION - Normal esophagus. - Z-line regular, 35 cm from the incisors. - Gastritis. Biopsied. - Normal incisura, antrum and pylorus. - Erythematous duodenopathy. Biopsied. - Normal second portion of the duodenum and third portion of the duodenum. - No evidence of primary malignancy site noted on this study.   07/10/2020 Imaging   US Abdomen 07/10/20 IMPRESSION: 1.  No acute hepatobiliary findings.   2. Heterogeneous liver echotexture with 3 masslike areas as described measuring 4.3 cm, 5.4 cm and 6.4 cm respectively. Recommend CT abdomen/pelvis with intravenous contrast for further evaluation.   07/10/2020 Imaging   CT AP 07/10/20  IMPRESSION: 1. There is a large masslike collection or mass in the central liver measuring at least 10.9 x 7.1 x 10.2 cm with 2 smaller adjacent satellite lesions/collections measuring 11 and 17 mm. These findings may represent a large abscess with satellite abscesses or a large neoplasm/malignancy. Recommend MRI for further evaluation. 2. Left renal cyst. 3. Calcified atherosclerosis in the distal abdominal aorta is mild. 4. Fibroid uterus.   07/10/2020 Imaging   MRI Abdomen 07/10/20  IMPRESSION: 1. The large central hepatic mass does not  demonstrate signal characteristics or enhancement typical of a hemangioma or abscess, and is suspicious for malignancy. There is at least 1 satellite lesion centrally in the right lobe. As there are no signs of underlying cirrhosis, primary considerations include peripheral cholangiocarcinoma and metastatic disease. Tissue sampling recommended. 2. Mild intrahepatic biliary dilatation within the left hepatic lobe. Distortion of the hepatic vasculature without evidence of tumor thrombus. 3. No extrahepatic metastatic disease or primary malignancy identified within the abdomen.   07/26/2020 Imaging   CT AP 07/26/20  IMPRESSION: 1. Markedly poorly visualized and ill-defined 12 cm lesion within the liver. This lesion is better evaluated on MR abdomen 07/10/2020. 2. Otherwise no acute intra-abdominal abnormality with markedly limited evaluation on this noncontrast study. 3.  Aortic Atherosclerosis (ICD10-I70.0).   07/26/2020 Initial Diagnosis   FINAL MICROSCOPIC DIAGNOSIS: 07/26/20  A. LIVER MASS, LEFT, NEEDLE CORE BIOPSY:  - Adenocarcinoma.   ADDENDUM: The adenocarcinoma is positive with immunohistochemistry for cytokeratin 7 and PAX 8.  The carcinoma is negative with cytokeratin 20, CDX 2, estrogen receptor, GATA3, GCDFP, monoclonal and polyclonal CEA, cytokeratin 5/6, TTF-1, Napsin A, HepPar1, arginase 1 and WT1.  The morphology and immunophenotype are consistent with adenocarcinoma.  The differential for possible primary sites is broad including kidney although the morphology is not typical for more common types of renal cell carcinoma.  Pancreaticobiliary and gynecologic cannot be ruled out.    07/30/2020 Imaging   CT Chest 07/30/20  IMPRESSION: 1. Pulmonary nodules are highly worrisome for metastatic disease. 2. Hepatic masses are consistent with biopsy-proven adenocarcinoma and better evaluated on MR abdomen 07/10/2020.   08/04/2020 Initial Diagnosis   Intrahepatic cholangiocarcinoma  ([place])   08/04/2020 Cancer Staging   Staging form: Intrahepatic Bile  Duct, AJCC 8th Edition - Clinical: Stage IB (cT1b, cN0, cM0) - Signed by [name], MD on 08/04/2020    08/13/2020 Procedure   PAC placement   08/16/2020 -  Chemotherapy   First line gemcitabine and cisplatin 2 weeks on/1 week off starting 08/16/20     08/16/2020 -  Chemotherapy   -Addition of durvalumab q3weeks with first-line chemo starting 08/16/20    08/18/2020 Procedure   Upper Endoscopy by Dr [name] IMPRESSION - Normal esophagus. - Z-line regular, 35 cm from the incisors. - Gastritis. Biopsied. - Normal incisura, antrum and pylorus. - Erythematous duodenopathy. Biopsied. - Normal second portion of the duodenum and third portion of the duodenum. - No evidence of primary malignancy site noted on this study.   08/18/2020 Pathology Results   FINAL MICROSCOPIC DIAGNOSIS:   A. DUODENUM, BULB, BIOPSY:  -  Erosive and reactive duodenal mucosa with Brunner's gland hyperplasia and mixed inflammation  -  No dysplasia or malignancy identified   B. STOMACH, BIOPSY:  -  Chronic active gastritis  -  H. pylori organisms present  -  No intestinal metaplasia identified  -  See comment   COMMENT:  Warthin-Starry stain is POSITIVE for organisms consistent with  Helicobacter pylori.    09/08/2020 Genetic Testing   Negative hereditary cancer genetic testing: no pathogenic variants detected in [facility] Multi-Cancer Panel + Pancreatitis Genes.  Variants of uncertain significance detected in RECQL4 at c.119-20C>A (Intronic) and SDHA at c.1039A>G (p.Met347Val).  The report date is September 08, 2020.    The Multi-Cancer Panel with pancreatitis genes offered by [facility] includes sequencing and/or deletion duplication testing of the following 89 genes: AIP, ALK, APC, ATM, AXIN2,BAP1,  BARD1, BLM, BMPR1A, BRCA1, BRCA2, BRIP1, CASR, CDC73, CDH1, CDK4, CDKN1B, CDKN1C, CDKN2A (p14ARF), CDKN2A (p16INK4a), CEBPA, CFTR, CHEK2, CPA1, CTNNA1, CTRC,  DICER1, DIS3L2, EGFR (c.2369C>T, p.Thr790Met variant only), EPCAM (Deletion/duplication testing only), FH, FLCN, GATA2, GPC3, GREM1 (Promoter region deletion/duplication testing only), HOXB13 (c.251G>A, p.Gly84Glu), HRAS, KIT, MAX, MEN1, MET, MITF (c.952G>A, p.Glu318Lys variant only), MLH1, MSH2, MSH3, MSH6, MUTYH, NBN, NF1, NF2, NTHL1, PALB2, PDGFRA, PHOX2B, PMS2, POLD1, POLE, POT1, PRKAR1A, PRSS1, PTCH1, PTEN, RAD50, RAD51C, RAD51D, RB1, RECQL4, RET, RNF43, RUNX1, SDHAF2, SDHA (sequence changes only), SDHB, SDHC, SDHD, SMAD4, SMARCA4, SMARCB1, SMARCE1, SPINK1, STK11, SUFU, TERC, TERT, TMEM127, TP53, TSC1, TSC2, VHL, WRN and WT1.    11/04/2020 Imaging   CT CAP  IMPRESSION: 1. Redemonstrated large, hypoenhancing mass of the central right lobe of the liver, which is stable or perhaps slightly enlarged compared to prior examination, measuring 11.5 x 7.0 cm, previously 10.9 x 7.1 cm when measured similarly. Small satellite lesions appear to have enlarged in the interval and there is new tumor involvement of the caudate, overall constellation of findings consistent with worsened cholangiocarcinoma. 2. There is segmental biliary ductal dilatation, primarily seen in the left lobe of the liver, not significantly changed compared to prior examination. The portal veins remain patent. 3. Interval enlargement of a nodule of the medial right lung base, consistent with worsened pulmonary metastatic disease. Additional small nodules are essentially stable and although technically nonspecific remain worrisome for additional small metastases. 4. Aortic atherosclerosis, advanced for patient age and gender. 5. Uterine fibroids.   12/27/2020 Imaging   CT Chest w/o contrast  IMPRESSION: Solid pulmonary nodules again seen, most are decreased in size compared to prior, a few are stable.   Hypoattenuating mass in the central right lobe of the liver appear similar to prior exam, although evaluation is limited  due  to lack of IV contrast.   Aortic Atherosclerosis (ICD10-I70.0).   01/27/2021 -  Chemotherapy   Patient is on Treatment Plan : COLORECTAL FOLFOX q14d     04/05/2021 Imaging   EXAM: CT CHEST WITHOUT CONTRAST  IMPRESSION: Interval increased size and number of multiple pulmonary nodules which are consistent with metastases.   04/05/2021 Imaging   EXAM: MRI ABDOMEN WITHOUT AND WITH CONTRAST (INCLUDING MRCP)  IMPRESSION: 1. Grossly stable size and appearance of the dominant heterogeneously enhancing central hepatic/biliary mass measuring up to 8.7 x 10.7 x 11.7 cm. Stable associated intrahepatic biliary ductal obstruction and dilatation, mostly on the left. 2. A few additional smaller enhancing lesions in the right hepatic lobe are again seen, less avidly enhancing and less well defined as on previous study. No definite new sizeable hepatic lesions identified. Limited evaluation due to motion. 3. Hepatosplenomegaly. 4. Stable mildly enlarged portacaval lymph node. 5. A few pulmonary nodular densities in the visualized lower lungs, correlate with chest CT performed the same day.     CURRENT THERAPY: Second line FOLFOX, starting 01/27/21   INTERVAL HISTORY: Ms. Smouse returns for follow up and treatment as scheduled. Last seen 04/07/21 and completed another cycle of FOLFOX. Her case was reviewed in tumor board to see if she is a candidate for Y90.  She is reporting more frequent "heavy" sensation and tingling in her feet more than hands which is bothersome but not painful.  She is functioning well, not dropping things, denies fall.  She has fatigue, headache, and cold sensitivity for a week after treatment.  She would like to continue treatment at same dose if possible.  She had nausea/vomiting x1 last time that resolved.  She has right upper quadrant pain that radiates up to the right chest and back for a few days after treatment, managed with tramadol.  This is new in the last 1 to  2 months.  Otherwise, denies fever, chills, cough, chest pain, dyspnea, leg edema, mucositis, rash, or any other complaints.   MEDICAL HISTORY:  Past Medical History:  Diagnosis Date   Cancer [facility])    Family history of breast cancer 08/23/2020   Family history of pancreatic cancer 08/23/2020   Family history of prostate cancer 08/23/2020   Family history of stomach cancer 08/23/2020   Fibroid    History of multiple miscarriages    x 3   Hypertension     SURGICAL HISTORY: Past Surgical History:  Procedure Laterality Date   BIOPSY  08/18/2020   Procedure: BIOPSY;  Surgeon: Lavena Bullion, DO;  Location: [facility];  Service: Gastroenterology;;   CATARACT EXTRACTION Bilateral    ESOPHAGOGASTRODUODENOSCOPY (EGD) WITH PROPOFOL N/A 08/18/2020   Procedure: ESOPHAGOGASTRODUODENOSCOPY (EGD) WITH PROPOFOL;  Surgeon: Lavena Bullion, DO;  Location: [facility];  Service: Gastroenterology;  Laterality: N/A;   IR IMAGING GUIDED PORT INSERTION  08/13/2020   PARATHYROIDECTOMY Right 02/19/15    I have reviewed the social history and family history with the patient and they are unchanged from previous note.  ALLERGIES:  has No Known Allergies.  MEDICATIONS:  Current Outpatient Medications  Medication Sig Dispense Refill   amLODipine (NORVASC) 2.5 MG tablet Take 1 tablet (2.5 mg total) by mouth daily. 90 tablet 1   b complex vitamins capsule Take 1 capsule by mouth daily. 30 capsule 1   ondansetron (ZOFRAN ODT) 4 MG disintegrating tablet Take 1-2 tablets (4-8 mg total) by mouth every 6 (six) hours as needed for nausea or vomiting. 30 tablet 1  pantoprazole (PROTONIX) 40 MG tablet Take 1 tablet (40 mg total) by mouth daily. 30 tablet 3   dexamethasone (DECADRON) 4 MG tablet Take 2 tablets at breakfast for 3 days, starting the day after cisplatin 40 tablet 2   diclofenac Sodium (VOLTAREN) 1 % GEL Apply 4 g topically 4 (four) times daily. 50 g 2   magnesium oxide (MAG-OX) 400 (240 Mg) MG  tablet Take 1 tablet (400 mg total) by mouth daily. 30 tablet 3   polyethylene glycol powder (GLYCOLAX/MIRALAX) 17 GM/SCOOP powder Take 17 g by mouth 2 (two) times daily as needed. 3350 g 1   traMADol (ULTRAM) 50 MG tablet Take 1 tablet (50 mg total) by mouth every 8 (eight) hours as needed for moderate pain. 30 tablet 0   No current facility-administered medications for this visit.   Facility-Administered Medications Ordered in Other Visits  Medication Dose Route Frequency Provider Last Rate Last Admin   acetaminophen (TYLENOL) 325 MG tablet            magnesium sulfate 2 GM/50ML IVPB            magnesium sulfate 2 GM/50ML IVPB            palonosetron (ALOXI) 0.25 MG/5ML injection             PHYSICAL EXAMINATION: ECOG PERFORMANCE STATUS: 1 - Symptomatic but completely ambulatory  Vitals:   04/21/21 0921  BP: 137/85  Pulse: (!) 107  Resp: 18  Temp: 98.3 F (36.8 C)  SpO2: 100%   Filed Weights   04/21/21 0921  Weight: 152 lb 6.4 oz (69.1 kg)    GENERAL:alert, no distress and comfortable SKIN: No rash EYES: sclera clear LUNGS: clear  with normal breathing effort HEART: Mildly tachycardic, regular rhythm, no murmurs.  No lower extremity edema ABDOMEN:abdomen soft, non-tender and normal bowel sounds.  Mild fullness over the RUQ Musculoskeletal: No focal spinal tenderness NEURO: alert & oriented x 3 with fluent speech, no focal motor deficits.  Intact peripheral vibratory sense over the fingertips per tuning fork exam PAC without erythema  LABORATORY DATA:  I have reviewed the data as listed CBC Latest Ref Rng & Units 04/21/2021 04/07/2021 03/24/2021  WBC 4.0 - 10.5 K/uL 5.0 4.1 2.6(L)  Hemoglobin 12.0 - 15.0 g/dL 9.4(L) 10.0(L) 10.2(L)  Hematocrit 36.0 - 46.0 % 29.6(L) 30.3(L) 30.6(L)  Platelets 150 - 400 K/uL 101(L) 94(L) 93(L)     CMP Latest Ref Rng & Units 04/21/2021 04/07/2021 03/24/2021  Glucose 70 - 99 mg/dL 185(H) 176(H) 154(H)  BUN 6 - 20 mg/dL 14 13 14    Creatinine 0.44 - 1.00 mg/dL 0.68 0.73 0.73  Sodium 135 - 145 mmol/L 135 137 139  Potassium 3.5 - 5.1 mmol/L 4.0 3.9 4.1  Chloride 98 - 111 mmol/L 101 102 104  CO2 22 - 32 mmol/L 27 28 26   Calcium 8.9 - 10.3 mg/dL 9.0 9.2 9.1  Total Protein 6.5 - 8.1 g/dL 7.3 7.5 7.6  Total Bilirubin 0.3 - 1.2 mg/dL 0.3 0.4 0.5  Alkaline Phos 38 - 126 U/L 213(H) 169(H) 166(H)  AST 15 - 41 U/L 34 32 29  ALT 0 - 44 U/L 16 14 15       RADIOGRAPHIC STUDIES: I have personally reviewed the radiological images as listed and agreed with the findings in the report. No results found.   ASSESSMENT & PLAN: Crystal Gibbs is a 46 y.o. female with     1. 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 Scheduled for EGD 5/11 to r/o upper GI primary.  -Her 07/10/20 MRI abdomen also shows there is at least 1 satellite lesion centrally in the right lobe and her 07/30/20 CT Chest shows Pulmonary nodules are highly worrisome for metastatic disease.  Upper endoscopy 08/18/2020 did not show malignancy. -her case was discussed in tumor board, this is most consistent with cholangiocarcinoma.  Unfortunately, her cancer is not resectable, and therefore not likely curable, but still treatable.  -She started first-line gemcitabine and cisplatin 2 weeks on/1 week off in addition to devalue Mab based on Topaz 1 trial data on 08/16/2020.  Unfortunately MRI 01/08/2021 showed development of more satellite lesions in the liver, consistent with disease progression -Foundation 1 on the initial liver biopsy showed MSI stable, no other targetable mutations -She switched to second line FOLFOX on 01/27/2021, tolerating well overall -MRI and CT chest 04/05/2021 showed overall stable disease in the liver and slight  progression in lungs.  Due to overall stable disease by resist criteria and lack of other good treatment options the recommendation is to continue FOLFOX for now since she is tolerating well -Ms. Koebel appears stable.  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  Side effects are well managed with supportive care at home.  She is able to recover and function well. -We discussed the RUQ and referred right chest/back pain is likely secondary to the large liver mass, she can try low dose NSAID (limit due to thrombocytopenia).  She can continue tramadol for moderate pain which is worked well in the past.  There is no strong evidence for disease progression. -I reviewed the tumor board discussion, she is a candidate for targeted liver therapy Y 90 to the left liver mass, patient is interested and agreeable to referral which I placed today. -The recommendation is to continue FOLFOX until Y 90 as scheduled then hold during the procedure.  Given that she has more neuropathy, we discussed reducing and possibly holding oxaliplatin in the future.  She strongly prefers to continue oxalic at the same dose for now.  She understands neuropathy may progress and potentially become irreversible in the future.  She agrees to start B complex vitamin. -Labs reviewed, adequate to proceed with FOLFOX today as planned. -Follow-up with next cycle in 2 weeks -The plan was reviewed with [name]   2. Abdominal Pain, secondary to #1 -She has had RUQ and epigastric pain for the last 2 months, occasionally radiates to right shoulder/upper back. Occurs mostly with eating and prolonged sitting -She was given Tramadol for her pain during her 07/09/20 ED visit, which has helped but she does not take it unless pain is severe.  -Stable, continue regimen   3. Weight Loss, secondary to #1 -She initially was on weight loss pill RE-Eshape for her IVF.  She stopped 3 months prior to diagnosis -Weight stable lately   4. Social and Acupuncturist  -She lives in [place] with her husband, although she was recently frequently in [place] for. She is a [place] Citizen.  -She and her husband does not speak much [place]. They require interpretor, one of who they listed as a point of contact.   -She is not working. He has job, but not currently  working, as to help his wife.  -She has [employer] health insurance.  -she was referred to establish care with new PCP, has appointment this week   -has been referred to SW and financial advocate for support and to possibly apply for Medicare/Medicaid.     5. Genetic testing  -Given her age and family history of GI cancer with her mother, she is eligible for genetic testing.  -scheduled 08/19/20  Plan: -Labs reviewed, proceed with FOLFOX today as planned, same dose, G-CSF on day 3 with pump DC -For neuropathy: Rx B complex vitamin po once daily -Discussed neuropathy and possible Oxali dose reduction in the future. Continue current dose for now -Reviewed tumor board discussion, patient agreeable to IR referral for consideration of Y 90, order placed -Refilled tramadol PRN for RUQ and referred pain -Follow-up in 2 weeks with next cycle, will continue chemo until Y 90 is scheduled   Orders Placed This Encounter  Procedures   Ambulatory referral to Interventional Radiology    Referral Priority:   Routine    Referral Type:   Consultation    Referral Reason:   Specialty Services Required    Requested Specialty:   Interventional Radiology    Number of Visits Requested:   1   All questions were answered. The patient knows to call the clinic with any problems, questions or concerns. No barriers to learning was detected. I spent 25 minutes counseling the patient face to face. The total time spent in the appointment was 40 minutes and more than 50% was on counseling and review of test results     Alla Feeling,  NP 04/21/21

## 2021-04-21 ENCOUNTER — Encounter: Payer: Self-pay | Admitting: Nurse Practitioner

## 2021-04-21 ENCOUNTER — Encounter: Payer: Self-pay | Admitting: Hematology

## 2021-04-21 ENCOUNTER — Inpatient Hospital Stay: Payer: 59 | Attending: Hematology

## 2021-04-21 ENCOUNTER — Inpatient Hospital Stay (HOSPITAL_BASED_OUTPATIENT_CLINIC_OR_DEPARTMENT_OTHER): Payer: 59 | Admitting: Nurse Practitioner

## 2021-04-21 ENCOUNTER — Other Ambulatory Visit: Payer: Self-pay

## 2021-04-21 ENCOUNTER — Inpatient Hospital Stay: Payer: 59

## 2021-04-21 VITALS — BP 130/80 | HR 100 | Temp 98.0°F | Resp 16

## 2021-04-21 VITALS — BP 137/85 | HR 107 | Temp 98.3°F | Resp 18 | Wt 152.4 lb

## 2021-04-21 DIAGNOSIS — Z5111 Encounter for antineoplastic chemotherapy: Secondary | ICD-10-CM | POA: Diagnosis present

## 2021-04-21 DIAGNOSIS — Z95828 Presence of other vascular implants and grafts: Secondary | ICD-10-CM

## 2021-04-21 DIAGNOSIS — C221 Intrahepatic bile duct carcinoma: Secondary | ICD-10-CM | POA: Diagnosis present

## 2021-04-21 DIAGNOSIS — T451X5A Adverse effect of antineoplastic and immunosuppressive drugs, initial encounter: Secondary | ICD-10-CM | POA: Insufficient documentation

## 2021-04-21 DIAGNOSIS — Z5189 Encounter for other specified aftercare: Secondary | ICD-10-CM | POA: Diagnosis not present

## 2021-04-21 DIAGNOSIS — Z79899 Other long term (current) drug therapy: Secondary | ICD-10-CM | POA: Diagnosis not present

## 2021-04-21 DIAGNOSIS — G620 Drug-induced polyneuropathy: Secondary | ICD-10-CM | POA: Insufficient documentation

## 2021-04-21 DIAGNOSIS — G893 Neoplasm related pain (acute) (chronic): Secondary | ICD-10-CM | POA: Diagnosis not present

## 2021-04-21 LAB — CMP (CANCER CENTER ONLY)
ALT: 16 U/L (ref 0–44)
AST: 34 U/L (ref 15–41)
Albumin: 3.6 g/dL (ref 3.5–5.0)
Alkaline Phosphatase: 213 U/L — ABNORMAL HIGH (ref 38–126)
Anion gap: 7 (ref 5–15)
BUN: 14 mg/dL (ref 6–20)
CO2: 27 mmol/L (ref 22–32)
Calcium: 9 mg/dL (ref 8.9–10.3)
Chloride: 101 mmol/L (ref 98–111)
Creatinine: 0.68 mg/dL (ref 0.44–1.00)
GFR, Estimated: >60 mL/min (ref >60)
Glucose, Bld: 185 mg/dL — ABNORMAL HIGH (ref 70–99)
Potassium: 4 mmol/L (ref 3.5–5.1)
Sodium: 135 mmol/L (ref 135–145)
Total Bilirubin: 0.3 mg/dL (ref 0.3–1.2)
Total Protein: 7.3 g/dL (ref 6.5–8.1)

## 2021-04-21 LAB — CBC WITH DIFFERENTIAL (CANCER CENTER ONLY)
Abs Immature Granulocytes: 0.03 10*3/uL (ref 0.00–0.07)
Basophils Absolute: 0 10*3/uL (ref 0.0–0.1)
Basophils Relative: 0 %
Eosinophils Absolute: 0.1 10*3/uL (ref 0.0–0.5)
Eosinophils Relative: 1 %
HCT: 29.6 % — ABNORMAL LOW (ref 36.0–46.0)
Hemoglobin: 9.4 g/dL — ABNORMAL LOW (ref 12.0–15.0)
Immature Granulocytes: 1 %
Lymphocytes Relative: 28 %
Lymphs Abs: 1.4 10*3/uL (ref 0.7–4.0)
MCH: 28.1 pg (ref 26.0–34.0)
MCHC: 31.8 g/dL (ref 30.0–36.0)
MCV: 88.4 fL (ref 80.0–100.0)
Monocytes Absolute: 0.8 10*3/uL (ref 0.1–1.0)
Monocytes Relative: 17 %
Neutro Abs: 2.7 10*3/uL (ref 1.7–7.7)
Neutrophils Relative %: 53 %
Platelet Count: 101 10*3/uL — ABNORMAL LOW (ref 150–400)
RBC: 3.35 MIL/uL — ABNORMAL LOW (ref 3.87–5.11)
RDW: 17 % — ABNORMAL HIGH (ref 11.5–15.5)
WBC Count: 5 10*3/uL (ref 4.0–10.5)
nRBC: 0 % (ref 0.0–0.2)

## 2021-04-21 LAB — PREGNANCY, URINE: Preg Test, Ur: NEGATIVE

## 2021-04-21 MED ORDER — DEXTROSE 5 % IV SOLN: Freq: Once | INTRAVENOUS | Status: AC

## 2021-04-21 MED ORDER — OXALIPLATIN CHEMO INJECTION 100 MG/20ML
85.0000 mg/m2 | Freq: Once | INTRAVENOUS | Status: AC
  Administered 2021-04-21: 150 mg via INTRAVENOUS
  Filled 2021-04-21: qty 20

## 2021-04-21 MED ORDER — SODIUM CHLORIDE 0.9% FLUSH
10.0000 mL | Freq: Once | INTRAVENOUS | Status: AC
  Administered 2021-04-21: 10 mL

## 2021-04-21 MED ORDER — PROCHLORPERAZINE EDISYLATE 10 MG/2ML IJ SOLN
10.0000 mg | Freq: Once | INTRAMUSCULAR | Status: AC
  Administered 2021-04-21: 10 mg via INTRAVENOUS
  Filled 2021-04-21: qty 2

## 2021-04-21 MED ORDER — LEUCOVORIN CALCIUM INJECTION 350 MG
400.0000 mg/m2 | Freq: Once | INTRAVENOUS | Status: AC
  Administered 2021-04-21: 716 mg via INTRAVENOUS
  Filled 2021-04-21: qty 25

## 2021-04-21 MED ORDER — ACETAMINOPHEN 325 MG PO TABS
650.0000 mg | ORAL_TABLET | Freq: Once | ORAL | Status: AC
  Administered 2021-04-21: 650 mg via ORAL
  Filled 2021-04-21: qty 2

## 2021-04-21 MED ORDER — TRAMADOL HCL 50 MG PO TABS: 50.0000 mg | ORAL_TABLET | Freq: Three times a day (TID) | ORAL | 0 refills | Status: DC | PRN

## 2021-04-21 MED ORDER — PALONOSETRON HCL INJECTION 0.25 MG/5ML
0.2500 mg | Freq: Once | INTRAVENOUS | Status: AC
  Administered 2021-04-21: 0.25 mg via INTRAVENOUS
  Filled 2021-04-21: qty 5

## 2021-04-21 MED ORDER — SODIUM CHLORIDE 0.9 % IV SOLN
10.0000 mg | Freq: Once | INTRAVENOUS | Status: AC
  Administered 2021-04-21: 10 mg via INTRAVENOUS
  Filled 2021-04-21: qty 10

## 2021-04-21 MED ORDER — B COMPLEX VITAMINS PO CAPS: 1.0000 | ORAL_CAPSULE | Freq: Every day | ORAL | 1 refills | Status: DC

## 2021-04-21 MED ORDER — SODIUM CHLORIDE 0.9 % IV SOLN
2400.0000 mg/m2 | INTRAVENOUS | Status: DC
  Administered 2021-04-21: 4300 mg via INTRAVENOUS
  Filled 2021-04-21: qty 86

## 2021-04-21 NOTE — Patient Instructions (Signed)
[facility] ONCOL[facility]  Discharge Instructions: Thank you for choosing [facility] to provide your oncology and hematology care.   If you have a lab appointment with the [facility], please go directly to the [facility] and check in at the registration area.   Wear comfortable clothing and clothing appropriate for easy access to any Portacath or PICC line.   We strive to give you quality time with your provider. You may need to reschedule your appointment if you arrive late (15 or more minutes).  Arriving late affects you and other patients whose appointments are after yours.  Also, if you miss three or more appointments without notifying the office, you may be dismissed from the clinic at the providers discretion.      For prescription refill requests, have your pharmacy contact our office and allow 72 hours for refills to be completed.    Today you received the following chemotherapy and/or immunotherapy agents Leucovorin, Oxaliplatin, 5FU      To help prevent nausea and vomiting after your treatment, we encourage you to take your nausea medication as directed.  BELOW ARE SYMPTOMS THAT SHOULD BE REPORTED IMMEDIATELY: *FEVER GREATER THAN 100.4 F (38 C) OR HIGHER *CHILLS OR SWEATING *NAUSEA AND VOMITING THAT IS NOT CONTROLLED WITH YOUR NAUSEA MEDICATION *UNUSUAL SHORTNESS OF BREATH *UNUSUAL BRUISING OR BLEEDING *URINARY PROBLEMS (pain or burning when urinating, or frequent urination) *BOWEL PROBLEMS (unusual diarrhea, constipation, pain near the anus) TENDERNESS IN MOUTH AND THROAT WITH OR WITHOUT PRESENCE OF ULCERS (sore throat, sores in mouth, or a toothache) UNUSUAL RASH, SWELLING OR PAIN  UNUSUAL VAGINAL DISCHARGE OR ITCHING   Items with * indicate a potential emergency and should be followed up as soon as possible or go to the Emergency Department if any problems should occur.  Please show the CHEMOTHERAPY ALERT CARD or IMMUNOTHERAPY ALERT  CARD at check-in to the Emergency Department and triage nurse.  Should you have questions after your visit or need to cancel or reschedule your appointment, please contact [facility]  Dept: [phone]  and follow the prompts.  Office hours are 8:00 a.m. to 4:30 p.m. Monday - Friday. Please note that voicemails left after 4:00 p.m. may not be returned until the following business day.  We are closed weekends and major holidays. You have access to a nurse at all times for urgent questions. Please call the main number to the clinic Dept: [phone] and follow the prompts.   For any non-urgent questions, you may also contact your provider using MyChart. We now offer e-Visits for anyone 64 and older to request care online for non-urgent symptoms. For details visit [email or web address].   Also download the MyChart app! Go to the app store, search "MyChart", open the app, select [facility], and log in with your MyChart username and password.  Due to Covid, a mask is required upon entering the hospital/clinic. If you do not have a mask, one will be given to you upon arrival. For doctor visits, patients may have 1 support person aged 89 or older with them. For treatment visits, patients cannot have anyone with them due to current Covid guidelines and our immunocompromised population.

## 2021-04-21 NOTE — Progress Notes (Signed)
..  Patient Assist/Replace for the following has been terminated. Medication: Udenyca ([id number]) Reason for Termination: Patient no longer self pay, has [employer] plan effective 04/10/2021 Last DOS: none  .Marland KitchenJuan Quam, CPhT IV Drug Replacement Specialist [place] Phone: [phone]

## 2021-04-21 NOTE — Progress Notes (Signed)
Referral, last ov note, demographics, and insurance information faxed to [facility] at [place] for consultation with interventional radiology for Y90 therapy.

## 2021-04-22 ENCOUNTER — Other Ambulatory Visit: Payer: Self-pay | Admitting: Nurse Practitioner

## 2021-04-22 ENCOUNTER — Telehealth: Payer: Self-pay | Admitting: *Deleted

## 2021-04-22 DIAGNOSIS — C221 Intrahepatic bile duct carcinoma: Secondary | ICD-10-CM

## 2021-04-22 LAB — CANCER ANTIGEN 19-9: CA 19-9: 275 U/mL — ABNORMAL HIGH (ref 0–35)

## 2021-04-22 NOTE — Telephone Encounter (Signed)
Ms. Melgarejo referred by Cira Rue for Y-90 Consult to IR. I lft msg on cell ph, hm ph cant lv msg has restrictions.Cathren Harsh

## 2021-04-23 ENCOUNTER — Inpatient Hospital Stay: Payer: 59

## 2021-04-23 ENCOUNTER — Other Ambulatory Visit: Payer: Self-pay

## 2021-04-23 VITALS — BP 117/80 | HR 107 | Temp 97.2°F | Resp 18

## 2021-04-23 DIAGNOSIS — C221 Intrahepatic bile duct carcinoma: Secondary | ICD-10-CM

## 2021-04-23 DIAGNOSIS — Z5111 Encounter for antineoplastic chemotherapy: Secondary | ICD-10-CM | POA: Diagnosis not present

## 2021-04-23 MED ORDER — HEPARIN SOD (PORK) LOCK FLUSH 100 UNIT/ML IV SOLN
500.0000 [IU] | Freq: Once | INTRAVENOUS | Status: AC | PRN
  Administered 2021-04-23: 500 [IU]

## 2021-04-23 MED ORDER — SODIUM CHLORIDE 0.9% FLUSH
10.0000 mL | INTRAVENOUS | Status: DC | PRN
  Administered 2021-04-23: 10 mL

## 2021-04-23 MED ORDER — PEGFILGRASTIM-CBQV 6 MG/0.6ML ~~LOC~~ SOSY
6.0000 mg | PREFILLED_SYRINGE | Freq: Once | SUBCUTANEOUS | Status: AC
  Administered 2021-04-23: 6 mg via SUBCUTANEOUS
  Filled 2021-04-23: qty 0.6

## 2021-05-05 ENCOUNTER — Other Ambulatory Visit: Payer: Self-pay

## 2021-05-05 NOTE — Progress Notes (Signed)
Inq about 1 hr inf appmt on 2/2 as pt was on q2w sch and was due on 1.26.  per desk rn sch was off a week and would notify Md and correct inf time (to be more than 1 hr)

## 2021-05-11 MED FILL — Dexamethasone Sodium Phosphate Inj 100 MG/10ML: INTRAMUSCULAR | Qty: 1 | Status: AC

## 2021-05-12 ENCOUNTER — Inpatient Hospital Stay: Payer: 59 | Attending: Hematology

## 2021-05-12 ENCOUNTER — Encounter: Payer: Self-pay | Admitting: Hematology

## 2021-05-12 ENCOUNTER — Inpatient Hospital Stay (HOSPITAL_BASED_OUTPATIENT_CLINIC_OR_DEPARTMENT_OTHER): Payer: 59 | Admitting: Hematology

## 2021-05-12 ENCOUNTER — Inpatient Hospital Stay: Payer: 59

## 2021-05-12 ENCOUNTER — Other Ambulatory Visit: Payer: Self-pay

## 2021-05-12 ENCOUNTER — Telehealth: Payer: Self-pay | Admitting: Hematology

## 2021-05-12 VITALS — HR 98

## 2021-05-12 VITALS — BP 152/96 | HR 126 | Temp 98.5°F | Resp 18 | Ht 66.0 in | Wt 150.4 lb

## 2021-05-12 DIAGNOSIS — Z79899 Other long term (current) drug therapy: Secondary | ICD-10-CM | POA: Insufficient documentation

## 2021-05-12 DIAGNOSIS — R5383 Other fatigue: Secondary | ICD-10-CM | POA: Insufficient documentation

## 2021-05-12 DIAGNOSIS — Z5111 Encounter for antineoplastic chemotherapy: Secondary | ICD-10-CM | POA: Diagnosis not present

## 2021-05-12 DIAGNOSIS — C221 Intrahepatic bile duct carcinoma: Secondary | ICD-10-CM

## 2021-05-12 DIAGNOSIS — I81 Portal vein thrombosis: Secondary | ICD-10-CM | POA: Insufficient documentation

## 2021-05-12 DIAGNOSIS — R630 Anorexia: Secondary | ICD-10-CM | POA: Diagnosis not present

## 2021-05-12 DIAGNOSIS — Z95828 Presence of other vascular implants and grafts: Secondary | ICD-10-CM

## 2021-05-12 DIAGNOSIS — C7800 Secondary malignant neoplasm of unspecified lung: Secondary | ICD-10-CM | POA: Insufficient documentation

## 2021-05-12 DIAGNOSIS — Z5189 Encounter for other specified aftercare: Secondary | ICD-10-CM | POA: Diagnosis not present

## 2021-05-12 DIAGNOSIS — E860 Dehydration: Secondary | ICD-10-CM | POA: Diagnosis not present

## 2021-05-12 DIAGNOSIS — G893 Neoplasm related pain (acute) (chronic): Secondary | ICD-10-CM | POA: Diagnosis not present

## 2021-05-12 DIAGNOSIS — C787 Secondary malignant neoplasm of liver and intrahepatic bile duct: Secondary | ICD-10-CM | POA: Diagnosis not present

## 2021-05-12 DIAGNOSIS — Z800 Family history of malignant neoplasm of digestive organs: Secondary | ICD-10-CM | POA: Diagnosis not present

## 2021-05-12 DIAGNOSIS — G620 Drug-induced polyneuropathy: Secondary | ICD-10-CM | POA: Insufficient documentation

## 2021-05-12 DIAGNOSIS — R590 Localized enlarged lymph nodes: Secondary | ICD-10-CM | POA: Insufficient documentation

## 2021-05-12 DIAGNOSIS — I700 Atherosclerosis of aorta: Secondary | ICD-10-CM | POA: Diagnosis not present

## 2021-05-12 DIAGNOSIS — I10 Essential (primary) hypertension: Secondary | ICD-10-CM | POA: Diagnosis not present

## 2021-05-12 LAB — CBC WITH DIFFERENTIAL (CANCER CENTER ONLY)
Abs Immature Granulocytes: 0.02 10*3/uL (ref 0.00–0.07)
Basophils Absolute: 0 10*3/uL (ref 0.0–0.1)
Basophils Relative: 0 %
Eosinophils Absolute: 0.1 10*3/uL (ref 0.0–0.5)
Eosinophils Relative: 1 %
HCT: 28.4 % — ABNORMAL LOW (ref 36.0–46.0)
Hemoglobin: 9.2 g/dL — ABNORMAL LOW (ref 12.0–15.0)
Immature Granulocytes: 0 %
Lymphocytes Relative: 21 %
Lymphs Abs: 1.4 10*3/uL (ref 0.7–4.0)
MCH: 28.1 pg (ref 26.0–34.0)
MCHC: 32.4 g/dL (ref 30.0–36.0)
MCV: 86.9 fL (ref 80.0–100.0)
Monocytes Absolute: 0.8 10*3/uL (ref 0.1–1.0)
Monocytes Relative: 13 %
Neutro Abs: 4.2 10*3/uL (ref 1.7–7.7)
Neutrophils Relative %: 65 %
Platelet Count: 115 10*3/uL — ABNORMAL LOW (ref 150–400)
RBC: 3.27 MIL/uL — ABNORMAL LOW (ref 3.87–5.11)
RDW: 18.4 % — ABNORMAL HIGH (ref 11.5–15.5)
WBC Count: 6.5 10*3/uL (ref 4.0–10.5)
nRBC: 0 % (ref 0.0–0.2)

## 2021-05-12 LAB — CMP (CANCER CENTER ONLY)
ALT: 15 U/L (ref 0–44)
AST: 36 U/L (ref 15–41)
Albumin: 3.6 g/dL (ref 3.5–5.0)
Alkaline Phosphatase: 236 U/L — ABNORMAL HIGH (ref 38–126)
Anion gap: 6 (ref 5–15)
BUN: 23 mg/dL — ABNORMAL HIGH (ref 6–20)
CO2: 26 mmol/L (ref 22–32)
Calcium: 9 mg/dL (ref 8.9–10.3)
Chloride: 104 mmol/L (ref 98–111)
Creatinine: 0.73 mg/dL (ref 0.44–1.00)
GFR, Estimated: >60 mL/min (ref >60)
Glucose, Bld: 166 mg/dL — ABNORMAL HIGH (ref 70–99)
Potassium: 4.1 mmol/L (ref 3.5–5.1)
Sodium: 136 mmol/L (ref 135–145)
Total Bilirubin: 0.4 mg/dL (ref 0.3–1.2)
Total Protein: 7.7 g/dL (ref 6.5–8.1)

## 2021-05-12 LAB — PREGNANCY, URINE: Preg Test, Ur: NEGATIVE

## 2021-05-12 MED ORDER — OXALIPLATIN CHEMO INJECTION 100 MG/20ML
70.0000 mg/m2 | Freq: Once | INTRAVENOUS | Status: AC
  Administered 2021-05-12: 125 mg via INTRAVENOUS
  Filled 2021-05-12: qty 20

## 2021-05-12 MED ORDER — LEUCOVORIN CALCIUM INJECTION 350 MG
400.0000 mg/m2 | Freq: Once | INTRAVENOUS | Status: AC
  Administered 2021-05-12: 716 mg via INTRAVENOUS
  Filled 2021-05-12: qty 35.8

## 2021-05-12 MED ORDER — ACETAMINOPHEN 325 MG PO TABS
650.0000 mg | ORAL_TABLET | Freq: Once | ORAL | Status: AC
  Administered 2021-05-12: 650 mg via ORAL
  Filled 2021-05-12: qty 2

## 2021-05-12 MED ORDER — PROCHLORPERAZINE EDISYLATE 10 MG/2ML IJ SOLN
10.0000 mg | Freq: Once | INTRAMUSCULAR | Status: AC
  Administered 2021-05-12: 10 mg via INTRAVENOUS
  Filled 2021-05-12: qty 2

## 2021-05-12 MED ORDER — GABAPENTIN 100 MG PO CAPS: 100.0000 mg | ORAL_CAPSULE | Freq: Two times a day (BID) | ORAL | 0 refills | Status: DC

## 2021-05-12 MED ORDER — SODIUM CHLORIDE 0.9 % IV SOLN
10.0000 mg | Freq: Once | INTRAVENOUS | Status: AC
  Administered 2021-05-12: 10 mg via INTRAVENOUS
  Filled 2021-05-12: qty 10

## 2021-05-12 MED ORDER — DEXTROSE 5 % IV SOLN: Freq: Once | INTRAVENOUS | Status: AC

## 2021-05-12 MED ORDER — SODIUM CHLORIDE 0.9 % IV SOLN
2400.0000 mg/m2 | INTRAVENOUS | Status: DC
  Administered 2021-05-12: 4300 mg via INTRAVENOUS
  Filled 2021-05-12: qty 86

## 2021-05-12 MED ORDER — SODIUM CHLORIDE 0.9% FLUSH
10.0000 mL | Freq: Once | INTRAVENOUS | Status: AC
  Administered 2021-05-12: 10 mL

## 2021-05-12 MED ORDER — PALONOSETRON HCL INJECTION 0.25 MG/5ML
0.2500 mg | Freq: Once | INTRAVENOUS | Status: AC
  Administered 2021-05-12: 0.25 mg via INTRAVENOUS
  Filled 2021-05-12: qty 5

## 2021-05-12 NOTE — Patient Instructions (Signed)
[facility] ONCOLOGY  Discharge Instructions: Thank you for choosing [facility] to provide your oncology and hematology care.   If you have a lab appointment with the [facility], please go directly to the [facility] and check in at the registration area.   Wear comfortable clothing and clothing appropriate for easy access to any Portacath or PICC line.   We strive to give you quality time with your provider. You may need to reschedule your appointment if you arrive late (15 or more minutes).  Arriving late affects you and other patients whose appointments are after yours.  Also, if you miss three or more appointments without notifying the office, you may be dismissed from the clinic at the providers discretion.      For prescription refill requests, have your pharmacy contact our office and allow 72 hours for refills to be completed.    Today you received the following chemotherapy and/or immunotherapy agents Leucovorin, Oxaliplatin, 5FU      To help prevent nausea and vomiting after your treatment, we encourage you to take your nausea medication as directed.  BELOW ARE SYMPTOMS THAT SHOULD BE REPORTED IMMEDIATELY: *FEVER GREATER THAN 100.4 F (38 C) OR HIGHER *CHILLS OR SWEATING *NAUSEA AND VOMITING THAT IS NOT CONTROLLED WITH YOUR NAUSEA MEDICATION *UNUSUAL SHORTNESS OF BREATH *UNUSUAL BRUISING OR BLEEDING *URINARY PROBLEMS (pain or burning when urinating, or frequent urination) *BOWEL PROBLEMS (unusual diarrhea, constipation, pain near the anus) TENDERNESS IN MOUTH AND THROAT WITH OR WITHOUT PRESENCE OF ULCERS (sore throat, sores in mouth, or a toothache) UNUSUAL RASH, SWELLING OR PAIN  UNUSUAL VAGINAL DISCHARGE OR ITCHING   Items with * indicate a potential emergency and should be followed up as soon as possible or go to the Emergency Department if any problems should occur.  Please show the CHEMOTHERAPY ALERT CARD or IMMUNOTHERAPY ALERT  CARD at check-in to the Emergency Department and triage nurse.  Should you have questions after your visit or need to cancel or reschedule your appointment, please contact [facility]  Dept: [phone]  and follow the prompts.  Office hours are 8:00 a.m. to 4:30 p.m. Monday - Friday. Please note that voicemails left after 4:00 p.m. may not be returned until the following business day.  We are closed weekends and major holidays. You have access to a nurse at all times for urgent questions. Please call the main number to the clinic Dept: [phone] and follow the prompts.   For any non-urgent questions, you may also contact your provider using MyChart. We now offer e-Visits for anyone 110 and older to request care online for non-urgent symptoms. For details visit [email or web address].   Also download the MyChart app! Go to the app store, search "MyChart", open the app, select [facility], and log in with your MyChart username and password.  Due to Covid, a mask is required upon entering the hospital/clinic. If you do not have a mask, one will be given to you upon arrival. For doctor visits, patients may have 1 support person aged 79 or older with them. For treatment visits, patients cannot have anyone with them due to current Covid guidelines and our immunocompromised population.

## 2021-05-12 NOTE — Telephone Encounter (Signed)
Scheduled follow-up appointments per 2/2 los. Patient's husband is aware.

## 2021-05-12 NOTE — Progress Notes (Signed)
[place]   [phone] Fax:([phone]) [phone]   Clinic Follow up Note   Patient Care Team: [name], MD as PCP - General (Family Medicine) [name], MD as Consulting Physician (Oncology) [name], RN (Inactive) as Oncology Nurse Navigator  Date of Service:  05/12/2021  CHIEF COMPLAINT: f/u of intrahepatic cholangiocarcinoma  CURRENT THERAPY:  Second line FOLFOX, starting 01/27/21  ASSESSMENT & PLAN:  Crystal Gibbs is a 46 y.o. female with   1. 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 FO showed MSI stable. -MRI abdomen 07/10/20 shows at least 1 satellite lesion centrally in right lobe, and CT Chest 07/30/20 shows suspicious pulmonary nodules. EGD 08/18/20 did not show malignancy. -this is most consistent with cholangiocarcinoma and is unfortunately not resectable. -she received gemcitabine/cisplatin with Durvalumab (based on TOPAZ1 trial) 5/9-10/6/22. She developed disease progression in the liver on MRI 01/08/21. -She switched to second line FOLFOX on 01/27/21, tolerating well overall -MRI and CT chest 04/05/21 showed overall stable disease in the liver and slight progression in lungs.  -per tumor board discussion, she is scheduled to meet IR tomorrow, 05/13/21, to discuss Y90 to the left liver mass. She will continue FOLFOX until Y 90 as scheduled then hold during the procedure.   -despite her neuropathy, she previously opted to continue oxali. Will reduce her dose today  -Labs reviewed, adequate to proceed with FOLFOX today as planned. Follow-up with next cycle in 2 weeks   2. 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 Abdominal Pain, secondary to #1 -She has had RUQ and epigastric pain for the last 2 months, occasionally radiates to right shoulder/upper back. Occurs mostly with eating and prolonged sitting -She was given Tramadol for her pain during her 07/09/20 ED visit, which has helped but she does not take it unless pain is severe.  -Stable, continue regimen   4. Social and Acupuncturist  -She lives in [place] with her husband, although she was recently frequently in [place] for. She is a [place] Citizen.  -She and her husband does not speak much [place]. They require interpretor, one of who they listed as a point of contact.   -She is not working. He has job, but not currently working, as to help his wife.  -She has [employer] health insurance.  -she was referred to establish care with new PCP, has appointment this week   -has been referred to SW and financial advocate for support and to possibly apply for Medicare/Medicaid.     5. Genetic testing  -Given her age and family history of GI cancer with her mother, she is eligible for genetic testing.  -scheduled 08/19/20   Plan: -proceed with FOLFOX today with dose reduction of oxali due to neuropathy  -G-CSF on day 3 with pump DC -IR consult tomorrow, 2/3 -called in gabapentin -lab, flush, f/u, and FOLFOX every 2 weeks -will continue chemo until Y 90 is scheduled   No problem-specific Assessment & Plan notes found for this encounter.   SUMMARY OF ONCOLOGIC HISTORY: Oncology History Overview Note  Cancer Staging Intrahepatic cholangiocarcinoma ([place]) Staging form: Intrahepatic Bile Duct, AJCC 8th  Edition - Clinical: Stage IB (cT1b, cN0, cM0) - Signed by [name], MD on 08/04/2020    Intrahepatic cholangiocarcinoma ([place])  08/19/2019 Procedure   Upper Endoscopy by Dr [name]  IMPRESSION - Normal esophagus. - Z-line regular, 35 cm from the incisors. -  Gastritis. Biopsied. - Normal incisura, antrum and pylorus. - Erythematous duodenopathy. Biopsied. - Normal second portion of the duodenum and third portion of the duodenum. - No evidence of primary malignancy site noted on this study.   07/10/2020 Imaging   US Abdomen 07/10/20 IMPRESSION: 1.  No acute hepatobiliary findings.   2. Heterogeneous liver echotexture with 3 masslike areas as described measuring 4.3 cm, 5.4 cm and 6.4 cm respectively. Recommend CT abdomen/pelvis with intravenous contrast for further evaluation.   07/10/2020 Imaging   CT AP 07/10/20  IMPRESSION: 1. There is a large masslike collection or mass in the central liver measuring at least 10.9 x 7.1 x 10.2 cm with 2 smaller adjacent satellite lesions/collections measuring 11 and 17 mm. These findings may represent a large abscess with satellite abscesses or a large neoplasm/malignancy. Recommend MRI for further evaluation. 2. Left renal cyst. 3. Calcified atherosclerosis in the distal abdominal aorta is mild. 4. Fibroid uterus.   07/10/2020 Imaging   MRI Abdomen 07/10/20  IMPRESSION: 1. The large central hepatic mass does not demonstrate signal characteristics or enhancement typical of a hemangioma or abscess, and is suspicious for malignancy. There is at least 1 satellite lesion centrally in the right lobe. As there are no signs of underlying cirrhosis, primary considerations include peripheral cholangiocarcinoma and metastatic disease. Tissue sampling recommended. 2. Mild intrahepatic biliary dilatation within the left hepatic lobe. Distortion of the hepatic vasculature without evidence of tumor thrombus. 3. No extrahepatic metastatic disease or primary malignancy identified within the abdomen.   07/26/2020 Imaging   CT AP 07/26/20  IMPRESSION: 1. Markedly poorly visualized and ill-defined 12 cm lesion within the liver. This lesion is better evaluated on MR abdomen 07/10/2020. 2. Otherwise no acute intra-abdominal  abnormality with markedly limited evaluation on this noncontrast study. 3.  Aortic Atherosclerosis (ICD10-I70.0).   07/26/2020 Initial Diagnosis   FINAL MICROSCOPIC DIAGNOSIS: 07/26/20  A. LIVER MASS, LEFT, NEEDLE CORE BIOPSY:  - Adenocarcinoma.   ADDENDUM: The adenocarcinoma is positive with immunohistochemistry for cytokeratin 7 and PAX 8.  The carcinoma is negative with cytokeratin 20, CDX 2, estrogen receptor, GATA3, GCDFP, monoclonal and polyclonal CEA, cytokeratin 5/6, TTF-1, Napsin A, HepPar1, arginase 1 and WT1.  The morphology and immunophenotype are consistent with adenocarcinoma.  The differential for possible primary sites is broad including kidney although the morphology is not typical for more common types of renal cell carcinoma.  Pancreaticobiliary and gynecologic cannot be ruled out.    07/30/2020 Imaging   CT Chest 07/30/20  IMPRESSION: 1. Pulmonary nodules are highly worrisome for metastatic disease. 2. Hepatic masses are consistent with biopsy-proven adenocarcinoma and better evaluated on MR abdomen 07/10/2020.   08/04/2020 Initial Diagnosis   Intrahepatic cholangiocarcinoma ([place])   08/04/2020 Cancer Staging   Staging form: Intrahepatic Bile Duct, AJCC 8th Edition - Clinical: Stage IB (cT1b, cN0, cM0) - Signed by [name], MD on 08/04/2020    08/13/2020 Procedure   PAC placement   08/16/2020 -  Chemotherapy   First line gemcitabine and cisplatin 2 weeks on/1 week off starting 08/16/20     08/16/2020 -  Chemotherapy   -Addition of durvalumab q3weeks with first-line chemo starting 08/16/20    08/18/2020 Procedure   Upper Endoscopy by  Dr [name] IMPRESSION - Normal esophagus. - Z-line regular, 35 cm from the incisors. - Gastritis. Biopsied. - Normal incisura, antrum and pylorus. - Erythematous duodenopathy. Biopsied. - Normal second portion of the duodenum and third portion of the duodenum. - No evidence of primary malignancy site noted on this study.   08/18/2020  Pathology Results   FINAL MICROSCOPIC DIAGNOSIS:   A. DUODENUM, BULB, BIOPSY:  -  Erosive and reactive duodenal mucosa with Brunner's gland hyperplasia and mixed inflammation  -  No dysplasia or malignancy identified   B. STOMACH, BIOPSY:  -  Chronic active gastritis  -  H. pylori organisms present  -  No intestinal metaplasia identified  -  See comment   COMMENT:  Warthin-Starry stain is POSITIVE for organisms consistent with  Helicobacter pylori.    09/08/2020 Genetic Testing   Negative hereditary cancer genetic testing: no pathogenic variants detected in [facility] Multi-Cancer Panel + Pancreatitis Genes.  Variants of uncertain significance detected in RECQL4 at c.119-20C>A (Intronic) and SDHA at c.1039A>G (p.Met347Val).  The report date is September 08, 2020.    The Multi-Cancer Panel with pancreatitis genes offered by [facility] includes sequencing and/or deletion duplication testing of the following 89 genes: AIP, ALK, APC, ATM, AXIN2,BAP1,  BARD1, BLM, BMPR1A, BRCA1, BRCA2, BRIP1, CASR, CDC73, CDH1, CDK4, CDKN1B, CDKN1C, CDKN2A (p14ARF), CDKN2A (p16INK4a), CEBPA, CFTR, CHEK2, CPA1, CTNNA1, CTRC, DICER1, DIS3L2, EGFR (c.2369C>T, p.Thr790Met variant only), EPCAM (Deletion/duplication testing only), FH, FLCN, GATA2, GPC3, GREM1 (Promoter region deletion/duplication testing only), HOXB13 (c.251G>A, p.Gly84Glu), HRAS, KIT, MAX, MEN1, MET, MITF (c.952G>A, p.Glu318Lys variant only), MLH1, MSH2, MSH3, MSH6, MUTYH, NBN, NF1, NF2, NTHL1, PALB2, PDGFRA, PHOX2B, PMS2, POLD1, POLE, POT1, PRKAR1A, PRSS1, PTCH1, PTEN, RAD50, RAD51C, RAD51D, RB1, RECQL4, RET, RNF43, RUNX1, SDHAF2, SDHA (sequence changes only), SDHB, SDHC, SDHD, SMAD4, SMARCA4, SMARCB1, SMARCE1, SPINK1, STK11, SUFU, TERC, TERT, TMEM127, TP53, TSC1, TSC2, VHL, WRN and WT1.    11/04/2020 Imaging   CT CAP  IMPRESSION: 1. Redemonstrated large, hypoenhancing mass of the central right lobe of the liver, which is stable or perhaps slightly  enlarged compared to prior examination, measuring 11.5 x 7.0 cm, previously 10.9 x 7.1 cm when measured similarly. Small satellite lesions appear to have enlarged in the interval and there is new tumor involvement of the caudate, overall constellation of findings consistent with worsened cholangiocarcinoma. 2. There is segmental biliary ductal dilatation, primarily seen in the left lobe of the liver, not significantly changed compared to prior examination. The portal veins remain patent. 3. Interval enlargement of a nodule of the medial right lung base, consistent with worsened pulmonary metastatic disease. Additional small nodules are essentially stable and although technically nonspecific remain worrisome for additional small metastases. 4. Aortic atherosclerosis, advanced for patient age and gender. 5. Uterine fibroids.   12/27/2020 Imaging   CT Chest w/o contrast  IMPRESSION: Solid pulmonary nodules again seen, most are decreased in size compared to prior, a few are stable.   Hypoattenuating mass in the central right lobe of the liver appear similar to prior exam, although evaluation is limited due to lack of IV contrast.   Aortic Atherosclerosis (ICD10-I70.0).   01/27/2021 -  Chemotherapy   Patient is on Treatment Plan : COLORECTAL FOLFOX q14d     04/05/2021 Imaging   EXAM: CT CHEST WITHOUT CONTRAST  IMPRESSION: Interval increased size and number of multiple pulmonary nodules which are consistent with metastases.   04/05/2021 Imaging   EXAM: MRI ABDOMEN WITHOUT AND WITH CONTRAST (INCLUDING MRCP)  IMPRESSION: 1. Grossly stable size  and appearance of the dominant heterogeneously enhancing central hepatic/biliary mass measuring up to 8.7 x 10.7 x 11.7 cm. Stable associated intrahepatic biliary ductal obstruction and dilatation, mostly on the left. 2. A few additional smaller enhancing lesions in the right hepatic lobe are again seen, less avidly enhancing and less  well defined as on previous study. No definite new sizeable hepatic lesions identified. Limited evaluation due to motion. 3. Hepatosplenomegaly. 4. Stable mildly enlarged portacaval lymph node. 5. A few pulmonary nodular densities in the visualized lower lungs, correlate with chest CT performed the same day.      INTERVAL HISTORY:  Crystal Gibbs is here for a follow up of intrahepatic cholangiocarcinoma. She was last seen by NP Lacie on 04/21/21. She presents to the clinic accompanied by her niece, who acts as our interpreter. She reports her neuropathy is worsening; she denies function limitation. She reports a shocking feeling down her legs when she bends her head forward (this is the position in which they pray).   All other systems were reviewed with the patient and are negative.  MEDICAL HISTORY:  Past Medical History:  Diagnosis Date   Cancer [facility])    Family history of breast cancer 08/23/2020   Family history of pancreatic cancer 08/23/2020   Family history of prostate cancer 08/23/2020   Family history of stomach cancer 08/23/2020   Fibroid    History of multiple miscarriages    x 3   Hypertension     SURGICAL HISTORY: Past Surgical History:  Procedure Laterality Date   BIOPSY  08/18/2020   Procedure: BIOPSY;  Surgeon: Lavena Bullion, DO;  Location: [facility];  Service: Gastroenterology;;   CATARACT EXTRACTION Bilateral    ESOPHAGOGASTRODUODENOSCOPY (EGD) WITH PROPOFOL N/A 08/18/2020   Procedure: ESOPHAGOGASTRODUODENOSCOPY (EGD) WITH PROPOFOL;  Surgeon: Lavena Bullion, DO;  Location: [facility];  Service: Gastroenterology;  Laterality: N/A;   IR IMAGING GUIDED PORT INSERTION  08/13/2020   PARATHYROIDECTOMY Right 02/19/15    I have reviewed the social history and family history with the patient and they are unchanged from previous note.  ALLERGIES:  has No Known Allergies.  MEDICATIONS:  Current Outpatient Medications  Medication Sig Dispense Refill    gabapentin (NEURONTIN) 100 MG capsule Take 1 capsule (100 mg total) by mouth 2 (two) times daily. 30 capsule 0   amLODipine (NORVASC) 2.5 MG tablet Take 1 tablet (2.5 mg total) by mouth daily. 90 tablet 1   b complex vitamins capsule Take 1 capsule by mouth daily. 30 capsule 1   dexamethasone (DECADRON) 4 MG tablet Take 2 tablets at breakfast for 3 days, starting the day after cisplatin 40 tablet 2   diclofenac Sodium (VOLTAREN) 1 % GEL Apply 4 g topically 4 (four) times daily. 50 g 2   magnesium oxide (MAG-OX) 400 (240 Mg) MG tablet Take 1 tablet (400 mg total) by mouth daily. 30 tablet 3   ondansetron (ZOFRAN ODT) 4 MG disintegrating tablet Take 1-2 tablets (4-8 mg total) by mouth every 6 (six) hours as needed for nausea or vomiting. 30 tablet 1   pantoprazole (PROTONIX) 40 MG tablet Take 1 tablet (40 mg total) by mouth daily. 30 tablet 3   polyethylene glycol powder (GLYCOLAX/MIRALAX) 17 GM/SCOOP powder Take 17 g by mouth 2 (two) times daily as needed. 3350 g 1   traMADol (ULTRAM) 50 MG tablet Take 1 tablet (50 mg total) by mouth every 8 (eight) hours as needed for moderate pain. 30 tablet 0   No current  facility-administered medications for this visit.   Facility-Administered Medications Ordered in Other Visits  Medication Dose Route Frequency Provider Last Rate Last Admin   acetaminophen (TYLENOL) 325 MG tablet            fluorouracil (ADRUCIL) 4,300 mg in sodium chloride 0.9 % 64 mL chemo infusion  2,400 mg/m2 (Treatment Plan Recorded) Intravenous 1 day or 1 dose [name], MD   4,300 mg at 05/12/21 1328   magnesium sulfate 2 GM/50ML IVPB            magnesium sulfate 2 GM/50ML IVPB            palonosetron (ALOXI) 0.25 MG/5ML injection             PHYSICAL EXAMINATION: ECOG PERFORMANCE STATUS: 2 - Symptomatic, <50% confined to bed  Vitals:   05/12/21 0939  BP: (!) 152/96  Pulse: (!) 126  Resp: 18  Temp: 98.5 F (36.9 C)  SpO2: 100%   Wt Readings from Last 3 Encounters:   05/12/21 150 lb 6.4 oz (68.2 kg)  04/21/21 152 lb 6.4 oz (69.1 kg)  04/07/21 152 lb 1.6 oz (69 kg)     GENERAL:alert, no distress and comfortable SKIN: skin color normal, no rashes or significant lesions EYES: normal, Conjunctiva are pink and non-injected, sclera clear  NEURO: alert & oriented x 3 with fluent speech  LABORATORY DATA:  I have reviewed the data as listed CBC Latest Ref Rng & Units 05/12/2021 04/21/2021 04/07/2021  WBC 4.0 - 10.5 K/uL 6.5 5.0 4.1  Hemoglobin 12.0 - 15.0 g/dL 9.2(L) 9.4(L) 10.0(L)  Hematocrit 36.0 - 46.0 % 28.4(L) 29.6(L) 30.3(L)  Platelets 150 - 400 K/uL 115(L) 101(L) 94(L)     CMP Latest Ref Rng & Units 05/12/2021 04/21/2021 04/07/2021  Glucose 70 - 99 mg/dL 166(H) 185(H) 176(H)  BUN 6 - 20 mg/dL 23(H) 14 13  Creatinine 0.44 - 1.00 mg/dL 0.73 0.68 0.73  Sodium 135 - 145 mmol/L 136 135 137  Potassium 3.5 - 5.1 mmol/L 4.1 4.0 3.9  Chloride 98 - 111 mmol/L 104 101 102  CO2 22 - 32 mmol/L _0 Calcium 8.9 - 10.3 mg/dL 9.0 9.0 9.2  Total Protein 6.5 - 8.1 g/dL 7.7 7.3 7.5  Total Bilirubin 0.3 - 1.2 mg/dL 0.4 0.3 0.4  Alkaline Phos 38 - 126 U/L 236(H) 213(H) 169(H)  AST 15 - 41 U/L 36 34 32  ALT 0 - 44 U/L _1 RADIOGRAPHIC STUDIES: I have personally reviewed the radiological images as listed and agreed with the findings in the report. No results found.    No orders of the defined types were placed in this encounter.  All questions were answered. The patient knows to call the clinic with any problems, questions or concerns. No barriers to learning was detected. The total time spent in the appointment was 30 minutes.     [name], MD 05/12/2021   I, Wilburn Mylar, am acting as scribe for [name], MD.   I have reviewed the above documentation for accuracy and completeness, and I agree with the above.

## 2021-05-13 ENCOUNTER — Encounter: Payer: Self-pay | Admitting: *Deleted

## 2021-05-13 ENCOUNTER — Ambulatory Visit
Admission: RE | Admit: 2021-05-13 | Discharge: 2021-05-13 | Disposition: A | Discharge status: Home / Self Care | Payer: 59 | Source: Ambulatory Visit | Attending: Nurse Practitioner | Admitting: Nurse Practitioner

## 2021-05-13 DIAGNOSIS — C221 Intrahepatic bile duct carcinoma: Secondary | ICD-10-CM

## 2021-05-13 HISTORY — PX: IR RADIOLOGIST EVAL & MGMT: IMG5224

## 2021-05-13 LAB — CANCER ANTIGEN 19-9: CA 19-9: 448 U/mL — ABNORMAL HIGH (ref 0–35)

## 2021-05-13 NOTE — Consult Note (Signed)
Chief Complaint: Patient was seen in consultation today for adenocarcinoma of the liver.  at the request of Burton,Lacie K  Referring Physician(s): Burton,Lacie K; [name]  History of Present Illness: Crystal Gibbs is a 46 y.o. 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  Patient also has small pulmonary nodules which have progressed on the most recent CT from 04/05/2021.  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  The dominant hepatic lesion was grossly stable on the most recent MRI from 04/05/2021.  The dominant liver lesion measures roughly 11.7 cm and there are additional small lesions in the periphery of the right hepatic lobe.  Patient received gemcitabine/cisplatin with Durvalumab (based on TOPAZ1 trial) 5/9-10/6/22 and switched to second line FOLFOX on 01/27/21.  Patient is currently getting chemotherapy.  Patient's main complaint is fatigue.  She also complained of mouth sores with areas of pain in her chest and lower extremities.  Patient has intermittent pain in her right abdomen.  Patient is accompanied by her husband and an interpreter.  Past Medical History:  Diagnosis Date   Cancer [facility])    Family history of breast cancer 08/23/2020   Family history of pancreatic cancer 08/23/2020   Family history of prostate cancer 08/23/2020   Family history of stomach cancer 08/23/2020   Fibroid    History of multiple miscarriages    x 3   Hypertension     Past Surgical History:  Procedure Laterality Date   BIOPSY  08/18/2020   Procedure: BIOPSY;  Surgeon: Lavena Bullion, DO;  Location: [facility];  Service: Gastroenterology;;   CATARACT EXTRACTION Bilateral    ESOPHAGOGASTRODUODENOSCOPY (EGD) WITH PROPOFOL N/A 08/18/2020   Procedure: ESOPHAGOGASTRODUODENOSCOPY (EGD) WITH PROPOFOL;  Surgeon: Lavena Bullion, DO;  Location: [facility];  Service: Gastroenterology;  Laterality: N/A;   IR IMAGING  GUIDED PORT INSERTION  08/13/2020   IR RADIOLOGIST EVAL & MGMT  05/13/2021   PARATHYROIDECTOMY Right 02/19/15    Allergies: Patient has no known allergies.  Medications: Prior to Admission medications   Medication Sig Start Date End Date Taking? Authorizing Provider  amLODipine (NORVASC) 2.5 MG tablet Take 1 tablet (2.5 mg total) by mouth daily. 02/24/21   [name], MD  b complex vitamins capsule Take 1 capsule by mouth daily. 04/21/21   Alla Feeling, NP  dexamethasone (DECADRON) 4 MG tablet Take 2 tablets at breakfast for 3 days, starting the day after cisplatin 08/16/20   Alla Feeling, NP  diclofenac Sodium (VOLTAREN) 1 % GEL Apply 4 g topically 4 (four) times daily. 09/08/20   Simmons-Robinson, Riki Sheer, MD  gabapentin (NEURONTIN) 100 MG capsule Take 1 capsule (100 mg total) by mouth 2 (two) times daily. 05/12/21   [name], MD  magnesium oxide (MAG-OX) 400 (240 Mg) MG tablet Take 1 tablet (400 mg total) by mouth daily. 12/16/20   [name], MD  ondansetron (ZOFRAN ODT) 4 MG disintegrating tablet Take 1-2 tablets (4-8 mg total) by mouth every 6 (six) hours as needed for nausea or vomiting. 03/24/21   [name], MD  pantoprazole (PROTONIX) 40 MG tablet Take 1 tablet (40 mg total) by mouth daily. 12/16/20   [name], MD  polyethylene glycol powder (GLYCOLAX/MIRALAX) 17 GM/SCOOP powder Take 17 g by mouth 2 (two) times daily as needed. 11/01/20   Simmons-Robinson, Riki Sheer, MD  traMADol (ULTRAM) 50 MG tablet Take 1 tablet (50 mg total) by mouth every 8 (eight) hours as needed for moderate  pain. 04/21/21   Alla Feeling, NP  prochlorperazine (COMPAZINE) 10 MG tablet Take 1 tablet (10 mg total) by mouth every 6 (six) hours as needed (Nausea or vomiting). 08/10/20 01/18/21  [name], MD     Family History  Problem Relation Age of Onset   Pancreatic cancer Mother 45   Diabetes Father    Liver cancer Father 33   Diabetes Sister    Breast cancer Sister 16   Prostate cancer Paternal Uncle        dx  unknown age   Stomach cancer Maternal Grandfather        dx 64s-80s   Prostate cancer Cousin        dx 48s?   Prostate cancer Paternal Uncle        dx unknown age; mets    Social History   Socioeconomic History   Marital status: Married    Spouse name: Not on file   Number of children: 0   Years of education: Not on file   Highest education level: Not on file  Occupational History   Occupation: unemployed  Tobacco Use   Smoking status: Never   Smokeless tobacco: Never  Vaping Use   Vaping Use: Never used  Substance and Sexual Activity   Alcohol use: No   Drug use: No   Sexual activity: Yes  Other Topics Concern   Not on file  Social History Narrative   Not on file   Social Determinants of Health   Financial Resource Strain: Not on file  Food Insecurity: Not on file  Transportation Needs: Not on file  Physical Activity: Not on file  Stress: Not on file  Social Connections: Not on file     Review of Systems  Constitutional:  Positive for fatigue.  Respiratory: Negative.    Cardiovascular:  Positive for chest pain.  Gastrointestinal:  Positive for abdominal pain.  Genitourinary: Negative.    Vital Signs: BP (!) 154/86 (BP Location: Left Arm)    Pulse (!) 125    SpO2 99%   Physical Exam Constitutional:      Appearance: She is ill-appearing.  Cardiovascular:     Rate and Rhythm: Tachycardia present.  Pulmonary:     Breath sounds: Normal breath sounds.  Abdominal:     General: Abdomen is flat. There is no distension.     Palpations: Abdomen is soft.     Tenderness: There is no abdominal tenderness.  Neurological:     Mental Status: She is alert.    Imaging: IR Radiologist Eval & Mgmt  Result Date: 05/13/2021 Please refer to notes tab for details about interventional procedure. (Op Note)   Labs:  CBC: Recent Labs    03/24/21 0938 04/07/21 1213 04/21/21 0904 05/12/21 0915  WBC 2.6* 4.1 5.0 6.5  HGB 10.2* 10.0* 9.4* 9.2*  HCT 30.6* 30.3*  29.6* 28.4*  PLT 93* 94* 101* 115*    COAGS: Recent Labs    07/26/20 1123  INR 0.9    BMP: Recent Labs    03/24/21 0938 04/07/21 1213 04/21/21 0904 05/12/21 0915  NA 139 137 135 136  K 4.1 3.9 4.0 4.1  CL 104 102 101 104  CO2 26 28 27 26   GLUCOSE 154* 176* 185* 166*  BUN 14 13 14  23*  CALCIUM 9.1 9.2 9.0 9.0  CREATININE 0.73 0.73 0.68 0.73  GFRNONAA >60 >60 >60 >60    LIVER FUNCTION TESTS: Recent Labs    03/24/21 5397 04/07/21 1213 04/21/21  6144 05/12/21 0915  BILITOT 0.5 0.4 0.3 0.4  AST 29 32 34 36  ALT 15 14 16 15   ALKPHOS 166* 169* 213* 236*  PROT 7.6 7.5 7.3 7.7  ALBUMIN 3.4* 3.7 3.6 3.6    TUMOR MARKERS: No results for input(s): AFPTM, CEA, CA199, CHROMGRNA in the last 8760 hours.  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  Patient has a dominant central hepatic mass with new and enlarging lung nodules on imaging from 04/05/2021.  Patient is currently receiving chemotherapy.  Most recent imaging from April 05, 2021 demonstrated that the liver disease was grossly stable.  Dominant lesion measuring almost 12 cm in the central aspect of the liver primarily involving segments 4 but there is probably involvement in segments 5 and 8.  In addition, there is involvement of segment 2.  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  No significant right intrahepatic biliary dilatation and no significant extrahepatic biliary dilatation.  Total bilirubin is normal measuring 0.4.  The alkaline phosphatase level has slightly increased to 236 and measured 166 one month ago.  Discussed liver directed therapy with the patient using the interpreter.  At this point, Y90 radioembolization would be the best option for any liver directed therapy.  We discussed the Y90 radioembolization procedure in depth and I showed the patient a video animation of how the procedure  works.  Patient understands that we need to do a mapping procedure with Tc52m MAA prior to the treatment procedure.  Based on the location of the patient's liver disease, she will likely need treatment to both the left and right hepatic lobes.  I shared my concerns about the patient's intrahepatic biliary dilatation and the increased risk of infection.  We will need to keep watching the alkaline phosphatase level but fortunately the bilirubin is normal.  The degree of biliary dilatation has not significantly changed since her preliminary imaging.  At this point, patient is a candidate for Y90 radioembolization to the liver tumors and patient would like to pursue this treatment option.  I am concerned about the pulmonary nodules and think it would be useful to get updated imaging prior to Y90 radioembolization procedure.  Patient is currently on chemotherapy and would like the patient to be off chemotherapy for 4 weeks prior to performing the Y90 procedure.  We will coordinate with oncology with regards to the timing of the chemotherapy.  Prior to the patient's mapping study, I would like to get a CT of the chest to follow-up on the lung disease and a CTA of the abdomen and pelvis to evaluate the hepatic artery anatomy prior to the mapping procedure.  Thank you for this interesting consult.  I greatly enjoyed meeting [facility] and look forward to participating in their care.  A copy of this report was sent to the requesting provider on this date.  Electronically Signed: Burman Riis 05/13/2021, 4:16 PM   I spent a total of  30 Minutes   in face to face in clinical consultation, greater than 50% of which was counseling/coordinating care for liver adenocarcinoma.

## 2021-05-14 ENCOUNTER — Other Ambulatory Visit: Payer: Self-pay

## 2021-05-14 ENCOUNTER — Inpatient Hospital Stay: Payer: 59

## 2021-05-14 VITALS — BP 114/81 | HR 99 | Temp 97.7°F | Resp 17 | Ht 66.0 in

## 2021-05-14 DIAGNOSIS — Z5111 Encounter for antineoplastic chemotherapy: Secondary | ICD-10-CM | POA: Diagnosis not present

## 2021-05-14 DIAGNOSIS — C221 Intrahepatic bile duct carcinoma: Secondary | ICD-10-CM

## 2021-05-14 MED ORDER — SODIUM CHLORIDE 0.9% FLUSH
10.0000 mL | INTRAVENOUS | Status: DC | PRN
  Administered 2021-05-14: 10 mL

## 2021-05-14 MED ORDER — PEGFILGRASTIM-CBQV 6 MG/0.6ML ~~LOC~~ SOSY
6.0000 mg | PREFILLED_SYRINGE | Freq: Once | SUBCUTANEOUS | Status: AC
  Administered 2021-05-14: 6 mg via SUBCUTANEOUS
  Filled 2021-05-14: qty 0.6

## 2021-05-14 MED ORDER — HEPARIN SOD (PORK) LOCK FLUSH 100 UNIT/ML IV SOLN
500.0000 [IU] | Freq: Once | INTRAVENOUS | Status: AC | PRN
  Administered 2021-05-14: 500 [IU]

## 2021-05-14 NOTE — Patient Instructions (Signed)

## 2021-05-16 ENCOUNTER — Other Ambulatory Visit: Payer: Self-pay | Admitting: Diagnostic Radiology

## 2021-05-16 DIAGNOSIS — R918 Other nonspecific abnormal finding of lung field: Secondary | ICD-10-CM

## 2021-05-19 ENCOUNTER — Ambulatory Visit (HOSPITAL_COMMUNITY)
Admission: RE | Admit: 2021-05-19 | Discharge: 2021-05-19 | Disposition: A | Discharge status: Home / Self Care | Payer: 59 | Source: Ambulatory Visit | Attending: Diagnostic Radiology | Admitting: Diagnostic Radiology

## 2021-05-19 ENCOUNTER — Other Ambulatory Visit: Payer: Self-pay

## 2021-05-19 ENCOUNTER — Encounter (HOSPITAL_COMMUNITY): Payer: Self-pay

## 2021-05-19 ENCOUNTER — Other Ambulatory Visit: Payer: 59

## 2021-05-19 ENCOUNTER — Ambulatory Visit: Payer: 59

## 2021-05-19 ENCOUNTER — Ambulatory Visit: Payer: 59 | Admitting: Hematology

## 2021-05-19 DIAGNOSIS — R918 Other nonspecific abnormal finding of lung field: Secondary | ICD-10-CM

## 2021-05-19 MED ORDER — IOHEXOL 300 MG/ML  SOLN
100.0000 mL | Freq: Once | INTRAMUSCULAR | Status: AC | PRN
  Administered 2021-05-19: 100 mL via INTRAVENOUS

## 2021-05-19 MED ORDER — SODIUM CHLORIDE (PF) 0.9 % IJ SOLN
INTRAMUSCULAR | Status: AC
  Filled 2021-05-19: qty 50

## 2021-05-25 MED FILL — Dexamethasone Sodium Phosphate Inj 100 MG/10ML: INTRAMUSCULAR | Qty: 1 | Status: AC

## 2021-05-26 ENCOUNTER — Inpatient Hospital Stay: Payer: 59

## 2021-05-26 ENCOUNTER — Other Ambulatory Visit: Payer: Self-pay

## 2021-05-26 ENCOUNTER — Encounter (HOSPITAL_COMMUNITY): Payer: Self-pay | Admitting: Diagnostic Radiology

## 2021-05-26 ENCOUNTER — Inpatient Hospital Stay (HOSPITAL_BASED_OUTPATIENT_CLINIC_OR_DEPARTMENT_OTHER): Payer: 59 | Admitting: Hematology

## 2021-05-26 VITALS — BP 112/77 | HR 106 | Temp 98.6°F | Resp 17 | Wt 148.8 lb

## 2021-05-26 DIAGNOSIS — Z5111 Encounter for antineoplastic chemotherapy: Secondary | ICD-10-CM | POA: Diagnosis not present

## 2021-05-26 DIAGNOSIS — C221 Intrahepatic bile duct carcinoma: Secondary | ICD-10-CM

## 2021-05-26 DIAGNOSIS — Z95828 Presence of other vascular implants and grafts: Secondary | ICD-10-CM

## 2021-05-26 LAB — CMP (CANCER CENTER ONLY)
ALT: 17 U/L (ref 0–44)
AST: 45 U/L — ABNORMAL HIGH (ref 15–41)
Albumin: 3.5 g/dL (ref 3.5–5.0)
Alkaline Phosphatase: 227 U/L — ABNORMAL HIGH (ref 38–126)
Anion gap: 6 (ref 5–15)
BUN: 12 mg/dL (ref 6–20)
CO2: 28 mmol/L (ref 22–32)
Calcium: 9.1 mg/dL (ref 8.9–10.3)
Chloride: 102 mmol/L (ref 98–111)
Creatinine: 0.66 mg/dL (ref 0.44–1.00)
GFR, Estimated: >60 mL/min (ref >60)
Glucose, Bld: 181 mg/dL — ABNORMAL HIGH (ref 70–99)
Potassium: 3.8 mmol/L (ref 3.5–5.1)
Sodium: 136 mmol/L (ref 135–145)
Total Bilirubin: 0.4 mg/dL (ref 0.3–1.2)
Total Protein: 7.6 g/dL (ref 6.5–8.1)

## 2021-05-26 LAB — CBC WITH DIFFERENTIAL (CANCER CENTER ONLY)
Abs Immature Granulocytes: 0.01 10*3/uL (ref 0.00–0.07)
Basophils Absolute: 0 10*3/uL (ref 0.0–0.1)
Basophils Relative: 1 %
Eosinophils Absolute: 0.1 10*3/uL (ref 0.0–0.5)
Eosinophils Relative: 3 %
HCT: 27.9 % — ABNORMAL LOW (ref 36.0–46.0)
Hemoglobin: 8.9 g/dL — ABNORMAL LOW (ref 12.0–15.0)
Immature Granulocytes: 0 %
Lymphocytes Relative: 26 %
Lymphs Abs: 1.1 10*3/uL (ref 0.7–4.0)
MCH: 28.1 pg (ref 26.0–34.0)
MCHC: 31.9 g/dL (ref 30.0–36.0)
MCV: 88 fL (ref 80.0–100.0)
Monocytes Absolute: 0.6 10*3/uL (ref 0.1–1.0)
Monocytes Relative: 15 %
Neutro Abs: 2.3 10*3/uL (ref 1.7–7.7)
Neutrophils Relative %: 55 %
Platelet Count: 91 10*3/uL — ABNORMAL LOW (ref 150–400)
RBC: 3.17 MIL/uL — ABNORMAL LOW (ref 3.87–5.11)
RDW: 18.8 % — ABNORMAL HIGH (ref 11.5–15.5)
WBC Count: 4.1 10*3/uL (ref 4.0–10.5)
nRBC: 0 % (ref 0.0–0.2)

## 2021-05-26 MED ORDER — SODIUM CHLORIDE 0.9% FLUSH
10.0000 mL | Freq: Once | INTRAVENOUS | Status: AC | PRN
  Administered 2021-05-26: 10 mL

## 2021-05-26 MED ORDER — HEPARIN SOD (PORK) LOCK FLUSH 100 UNIT/ML IV SOLN: 500.0000 [IU] | Freq: Once | INTRAVENOUS | Status: DC

## 2021-05-26 MED ORDER — SODIUM CHLORIDE 0.9% FLUSH: 3.0000 mL | Freq: Once | INTRAVENOUS | Status: DC | PRN

## 2021-05-26 MED ORDER — LIDOCAINE-PRILOCAINE 2.5-2.5 % EX CREA: 1.0000 "application " | TOPICAL_CREAM | CUTANEOUS | 1 refills | Status: AC | PRN

## 2021-05-26 MED ORDER — SODIUM CHLORIDE 0.9% FLUSH
10.0000 mL | Freq: Once | INTRAVENOUS | Status: AC
  Administered 2021-05-26: 10 mL

## 2021-05-26 MED ORDER — ALTEPLASE 2 MG IJ SOLR: 2.0000 mg | Freq: Once | INTRAMUSCULAR | Status: DC | PRN

## 2021-05-26 MED ORDER — SODIUM CHLORIDE 0.9 % IV SOLN: Freq: Once | INTRAVENOUS | Status: DC

## 2021-05-26 MED ORDER — HEPARIN SOD (PORK) LOCK FLUSH 100 UNIT/ML IV SOLN: 250.0000 [IU] | Freq: Once | INTRAVENOUS | Status: DC | PRN

## 2021-05-26 MED ORDER — SODIUM CHLORIDE 0.9% FLUSH: 10.0000 mL | Freq: Once | INTRAVENOUS | Status: DC

## 2021-05-26 MED ORDER — SODIUM CHLORIDE 0.9 % IV SOLN: INTRAVENOUS | Status: DC

## 2021-05-26 MED ORDER — HEPARIN SOD (PORK) LOCK FLUSH 100 UNIT/ML IV SOLN
500.0000 [IU] | Freq: Once | INTRAVENOUS | Status: AC | PRN
  Administered 2021-05-26: 500 [IU]

## 2021-05-26 MED ORDER — MEGESTROL ACETATE 625 MG/5ML PO SUSP: 625.0000 mg | Freq: Every day | ORAL | 0 refills | Status: AC

## 2021-05-26 NOTE — Progress Notes (Signed)
Patient ID: Crystal Gibbs, female   DOB: 1975-06-03, 46 y.o.   MRN: [id number] Reviewed CT studies from 05/21/2021.  Evidence for progression of disease in the liver and chest.  [name] and I agree that the patient is not a good candidate for liver directed therapy at this time and we will not proceed with Y90 radioembolization.

## 2021-05-26 NOTE — Progress Notes (Signed)
DISCONTINUE OFF PATHWAY REGIMEN - Other   [id number]:FOLFOXIRI (Fluorouracil 2,400 mg/m2/cycle) q14 Days:   A cycle is every 14 days:     Irinotecan      Oxaliplatin      Leucovorin      Fluorouracil   **Always confirm dose/schedule in your pharmacy ordering system**  REASON: Disease Progression PRIOR TREATMENT: FOLFOXIRI (Fluorouracil 2,400 mg/m2/cycle) q14 Days TREATMENT RESPONSE: Progressive Disease (PD)  START OFF PATHWAY REGIMEN - Other   [id number]:FOLFIRI (Leucovorin IV D1 + Fluorouracil IV D1/CIV D1,2 + Irinotecan IV D1) q14 Days:   A cycle is every 14 days:     Irinotecan      Leucovorin      Fluorouracil      Fluorouracil   **Always confirm dose/schedule in your pharmacy ordering system**  Patient Characteristics: Intent of Therapy: Non-Curative / Palliative Intent, Discussed with Patient

## 2021-05-26 NOTE — Progress Notes (Signed)
[place]   [phone] [phone]   Clinic Follow up Note   Patient Care Team: [name], MD as PCP - General (Family Medicine) [name], MD as Consulting Physician (Oncology) [name], RN (Inactive) as Oncology Nurse Navigator  Date of Service:  05/26/2021  CHIEF COMPLAINT: f/u of intrahepatic cholangiocarcinoma  CURRENT THERAPY:  PENDING FOLFIRI, to start 06/02/21  ASSESSMENT & PLAN:  Crystal Gibbs is a 46 y.o. female with   1. 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 FO showed MSI stable. -MRI abdomen 07/10/20 shows at least 1 satellite lesion centrally in right lobe, and CT Chest 07/30/20 shows suspicious pulmonary nodules. EGD 08/18/20 did not show malignancy. -this is most consistent with cholangiocarcinoma and is unfortunately not resectable. -she received gemcitabine/cisplatin with Durvalumab (based on TOPAZ1 trial) 5/9-10/6/22. She developed disease progression in the liver on MRI 01/08/21. -She switched to second line FOLFOX on 01/27/21, tolerating well overall -she met with Dr. Anselm Pancoast on 05/13/21 to discuss Y90. 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 I reviewed the images myself and discussed the results with them today. Because of the worsening lung metastases, she is no longer a candidate for Y90.  I communicated with Dr. Anselm Pancoast. -I discussed changing treatments today.  We discussed the overall poor prognosis, and lack of standard systemic therapy at  this point.  I discussed the option of FOLFIRI, single agent irinotecan, lenvatinib, it etc.  She previously has received immunotherapy.  I recommend changing to FOLFIRI.  I discussed the potential side effects, especially diarrhea and management , she agrees to proceed.  We will cancel treatment and move forward with IVF today. -I also discussed palliative care and hospice,  she is not ready for hospice. -Follow-up with second cycle FOLFIRI.  Plan to reduce dose for first cycle to see her tolerance.   2. 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 Abdominal Pain, secondary to #1 -She has had RUQ and epigastric pain for the last 2 months, occasionally radiates to right shoulder/upper back. Occurs mostly with eating and prolonged sitting -She was given Tramadol for her pain during her 07/09/20 ED visit, which has helped but she does not take it unless pain is severe.  -Stable, continue regimen   4. Social and Acupuncturist  -She lives in [place] with her husband, although she was recently frequently in [place] for. She is a [place] Citizen.  -She and her husband does not speak much [place]. They require interpretor, one of who they listed as a point of contact.   -She is not working. He has job, but not currently working, as to help his wife.  -She has Friday health insurance.  -she has established care with new PCP -has been referred to SW and financial advocate for support and to possibly apply for Medicare/Medicaid.     5. Genetic testing  -Given her age and family history of GI cancer with her mother, she is eligible for  genetic testing.  -testing performed 08/19/20 was negative with two VUS's (RECQL4 and SDHA)  6.  Fatigue and anorexia -Secondary to cancer progression -I discussed the options for anorexia, and recommended her to  try Megace.  I called in today, we discussed small risk of thrombosis and from Megace and the preventive strategy.   Plan: -I reviewed her recent CT scan and lab work, unfortunately she has had cancer progression.  We will stop FOLFOX.  -Proceed with 1 hr IVF only today -I called in emla cream and megace -plan to start FOLFIRI in 1 week, with dose reduction for first cycle. -Follow-up with second cycle.   No problem-specific Assessment & Plan notes found for this encounter.   SUMMARY OF ONCOLOGIC HISTORY: Oncology History Overview Note  Cancer Staging Intrahepatic cholangiocarcinoma ([facility]) Staging form: Intrahepatic Bile Duct, AJCC 8th Edition - Clinical: Stage IB (cT1b, cN0, cM0) - Signed by [name], MD on 08/04/2020    Intrahepatic cholangiocarcinoma ([facility])  08/19/2019 Procedure   Upper Endoscopy by Dr [name]  IMPRESSION - Normal esophagus. - Z-line regular, 35 cm from the incisors. - Gastritis. Biopsied. - Normal incisura, antrum and pylorus. - Erythematous duodenopathy. Biopsied. - Normal second portion of the duodenum and third portion of the duodenum. - No evidence of primary malignancy site noted on this study.   07/10/2020 Imaging   US Abdomen 07/10/20 IMPRESSION: 1.  No acute hepatobiliary findings.   2. Heterogeneous liver echotexture with 3 masslike areas as described measuring 4.3 cm, 5.4 cm and 6.4 cm respectively. Recommend CT abdomen/pelvis with intravenous contrast for further evaluation.   07/10/2020 Imaging   CT AP 07/10/20  IMPRESSION: 1. There is a large masslike collection or mass in the central liver measuring at least 10.9 x 7.1 x 10.2 cm with 2 smaller adjacent satellite lesions/collections measuring 11 and 17 mm. These findings may represent a large abscess with satellite abscesses or a large neoplasm/malignancy. Recommend MRI for further evaluation. 2. Left renal cyst. 3. Calcified atherosclerosis in the distal abdominal aorta is mild. 4. Fibroid  uterus.   07/10/2020 Imaging   MRI Abdomen 07/10/20  IMPRESSION: 1. The large central hepatic mass does not demonstrate signal characteristics or enhancement typical of a hemangioma or abscess, and is suspicious for malignancy. There is at least 1 satellite lesion centrally in the right lobe. As there are no signs of underlying cirrhosis, primary considerations include peripheral cholangiocarcinoma and metastatic disease. Tissue sampling recommended. 2. Mild intrahepatic biliary dilatation within the left hepatic lobe. Distortion of the hepatic vasculature without evidence of tumor thrombus. 3. No extrahepatic metastatic disease or primary malignancy identified within the abdomen.   07/26/2020 Imaging   CT AP 07/26/20  IMPRESSION: 1. Markedly poorly visualized and ill-defined 12 cm lesion within the liver. This lesion is better evaluated on MR abdomen 07/10/2020. 2. Otherwise no acute intra-abdominal abnormality with markedly limited evaluation on this noncontrast study. 3.  Aortic Atherosclerosis (ICD10-I70.0).   07/26/2020 Initial Diagnosis   FINAL MICROSCOPIC DIAGNOSIS: 07/26/20  A. LIVER MASS, LEFT, NEEDLE CORE BIOPSY:  - Adenocarcinoma.   ADDENDUM: The adenocarcinoma is positive with immunohistochemistry for cytokeratin 7 and PAX 8.  The carcinoma is negative with cytokeratin 20, CDX 2, estrogen receptor, GATA3, GCDFP, monoclonal and polyclonal CEA, cytokeratin 5/6, TTF-1, Napsin A, HepPar1, arginase 1 and WT1.  The morphology and immunophenotype are consistent with adenocarcinoma.  The differential for possible primary sites is broad including kidney although the morphology is not typical for more common types of renal  cell carcinoma.  Pancreaticobiliary and gynecologic cannot be ruled out.    07/30/2020 Imaging   CT Chest 07/30/20  IMPRESSION: 1. Pulmonary nodules are highly worrisome for metastatic disease. 2. Hepatic masses are consistent with biopsy-proven adenocarcinoma and  better evaluated on MR abdomen 07/10/2020.   08/04/2020 Initial Diagnosis   Intrahepatic cholangiocarcinoma ([facility])   08/04/2020 Cancer Staging   Staging form: Intrahepatic Bile Duct, AJCC 8th Edition - Clinical: Stage IB (cT1b, cN0, cM0) - Signed by [name], MD on 08/04/2020    08/13/2020 Procedure   PAC placement   08/16/2020 -  Chemotherapy   First line gemcitabine and cisplatin 2 weeks on/1 week off starting 08/16/20     08/16/2020 -  Chemotherapy   -Addition of durvalumab q3weeks with first-line chemo starting 08/16/20    08/18/2020 Procedure   Upper Endoscopy by Dr [name] IMPRESSION - Normal esophagus. - Z-line regular, 35 cm from the incisors. - Gastritis. Biopsied. - Normal incisura, antrum and pylorus. - Erythematous duodenopathy. Biopsied. - Normal second portion of the duodenum and third portion of the duodenum. - No evidence of primary malignancy site noted on this study.   08/18/2020 Pathology Results   FINAL MICROSCOPIC DIAGNOSIS:   A. DUODENUM, BULB, BIOPSY:  -  Erosive and reactive duodenal mucosa with Brunner's gland hyperplasia and mixed inflammation  -  No dysplasia or malignancy identified   B. STOMACH, BIOPSY:  -  Chronic active gastritis  -  H. pylori organisms present  -  No intestinal metaplasia identified  -  See comment   COMMENT:  Warthin-Starry stain is POSITIVE for organisms consistent with  Helicobacter pylori.    09/08/2020 Genetic Testing   Negative hereditary cancer genetic testing: no pathogenic variants detected in [facility] Multi-Cancer Panel + Pancreatitis Genes.  Variants of uncertain significance detected in RECQL4 at c.119-20C>A (Intronic) and SDHA at c.1039A>G (p.Met347Val).  The report date is September 08, 2020.    The Multi-Cancer Panel with pancreatitis genes offered by [facility] includes sequencing and/or deletion duplication testing of the following 89 genes: AIP, ALK, APC, ATM, AXIN2,BAP1,  BARD1, BLM, BMPR1A, BRCA1, BRCA2, BRIP1, CASR,  CDC73, CDH1, CDK4, CDKN1B, CDKN1C, CDKN2A (p14ARF), CDKN2A (p16INK4a), CEBPA, CFTR, CHEK2, CPA1, CTNNA1, CTRC, DICER1, DIS3L2, EGFR (c.2369C>T, p.Thr790Met variant only), EPCAM (Deletion/duplication testing only), FH, FLCN, GATA2, GPC3, GREM1 (Promoter region deletion/duplication testing only), HOXB13 (c.251G>A, p.Gly84Glu), HRAS, KIT, MAX, MEN1, MET, MITF (c.952G>A, p.Glu318Lys variant only), MLH1, MSH2, MSH3, MSH6, MUTYH, NBN, NF1, NF2, NTHL1, PALB2, PDGFRA, PHOX2B, PMS2, POLD1, POLE, POT1, PRKAR1A, PRSS1, PTCH1, PTEN, RAD50, RAD51C, RAD51D, RB1, RECQL4, RET, RNF43, RUNX1, SDHAF2, SDHA (sequence changes only), SDHB, SDHC, SDHD, SMAD4, SMARCA4, SMARCB1, SMARCE1, SPINK1, STK11, SUFU, TERC, TERT, TMEM127, TP53, TSC1, TSC2, VHL, WRN and WT1.    11/04/2020 Imaging   CT CAP  IMPRESSION: 1. Redemonstrated large, hypoenhancing mass of the central right lobe of the liver, which is stable or perhaps slightly enlarged compared to prior examination, measuring 11.5 x 7.0 cm, previously 10.9 x 7.1 cm when measured similarly. Small satellite lesions appear to have enlarged in the interval and there is new tumor involvement of the caudate, overall constellation of findings consistent with worsened cholangiocarcinoma. 2. There is segmental biliary ductal dilatation, primarily seen in the left lobe of the liver, not significantly changed compared to prior examination. The portal veins remain patent. 3. Interval enlargement of a nodule of the medial right lung base, consistent with worsened pulmonary metastatic disease. Additional small nodules are essentially stable and although technically nonspecific remain worrisome  for additional small metastases. 4. Aortic atherosclerosis, advanced for patient age and gender. 5. Uterine fibroids.   12/27/2020 Imaging   CT Chest w/o contrast  IMPRESSION: Solid pulmonary nodules again seen, most are decreased in size compared to prior, a few are stable.    Hypoattenuating mass in the central right lobe of the liver appear similar to prior exam, although evaluation is limited due to lack of IV contrast.   Aortic Atherosclerosis (ICD10-I70.0).   01/27/2021 - 05/14/2021 Chemotherapy   Patient is on Treatment Plan : COLORECTAL FOLFOX q14d     04/05/2021 Imaging   EXAM: CT CHEST WITHOUT CONTRAST  IMPRESSION: Interval increased size and number of multiple pulmonary nodules which are consistent with metastases.   04/05/2021 Imaging   EXAM: MRI ABDOMEN WITHOUT AND WITH CONTRAST (INCLUDING MRCP)  IMPRESSION: 1. Grossly stable size and appearance of the dominant heterogeneously enhancing central hepatic/biliary mass measuring up to 8.7 x 10.7 x 11.7 cm. Stable associated intrahepatic biliary ductal obstruction and dilatation, mostly on the left. 2. A few additional smaller enhancing lesions in the right hepatic lobe are again seen, less avidly enhancing and less well defined as on previous study. No definite new sizeable hepatic lesions identified. Limited evaluation due to motion. 3. Hepatosplenomegaly. 4. Stable mildly enlarged portacaval lymph node. 5. A few pulmonary nodular densities in the visualized lower lungs, correlate with chest CT performed the same day.   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   Progressing left greater than right intrahepatic biliary ductal dilatation, secondary to central obstructing mass.   Minimal aortic atherosclerosis. Aortic Atherosclerosis      INTERVAL HISTORY:  Crystal Gibbs is here for a follow up of intrahepatic cholangiocarcinoma. She was last seen by me on 05/12/21. She was seen in the  infusion area, accompanied by her niece. She reports continued neuropathy and fatigue. She notes the neuropathy is overall stable.   All other systems were reviewed with the patient and are negative.  MEDICAL HISTORY:  Past Medical History:  Diagnosis Date   Cancer [facility])    Family history of breast cancer 08/23/2020   Family history of pancreatic cancer 08/23/2020   Family history of prostate cancer 08/23/2020   Family history of stomach cancer 08/23/2020   Fibroid    History of multiple miscarriages    x 3   Hypertension     SURGICAL HISTORY: Past Surgical History:  Procedure Laterality Date   BIOPSY  08/18/2020   Procedure: BIOPSY;  Surgeon: Lavena Bullion, DO;  Location: [facility];  Service: Gastroenterology;;   CATARACT EXTRACTION Bilateral    ESOPHAGOGASTRODUODENOSCOPY (EGD) WITH PROPOFOL N/A 08/18/2020   Procedure: ESOPHAGOGASTRODUODENOSCOPY (EGD) WITH PROPOFOL;  Surgeon: Lavena Bullion, DO;  Location: [facility];  Service: Gastroenterology;  Laterality: N/A;   IR IMAGING GUIDED PORT INSERTION  08/13/2020   IR RADIOLOGIST EVAL & MGMT  05/13/2021   PARATHYROIDECTOMY Right 02/19/15    I have reviewed the social history and family history with the patient and they are unchanged from previous note.  ALLERGIES:  has No Known Allergies.  MEDICATIONS:  Current Outpatient Medications  Medication Sig Dispense Refill   lidocaine-prilocaine (EMLA) cream Apply 1 application topically as needed. 30 g 1   megestrol (MEGACE  ES) 625 MG/5ML suspension Take 5 mLs (625 mg total) by mouth daily. 150 mL 0   amLODipine (NORVASC) 2.5 MG tablet Take 1 tablet (2.5 mg total) by mouth daily. 90 tablet 1   b complex vitamins capsule Take 1 capsule by mouth daily. 30 capsule 1   dexamethasone (DECADRON) 4 MG tablet Take 2 tablets at breakfast for 3 days, starting the day after cisplatin 40 tablet 2   diclofenac Sodium (VOLTAREN) 1 % GEL Apply 4 g topically 4 (four) times daily. 50 g 2    gabapentin (NEURONTIN) 100 MG capsule Take 1 capsule (100 mg total) by mouth 2 (two) times daily. 30 capsule 0   magnesium oxide (MAG-OX) 400 (240 Mg) MG tablet Take 1 tablet (400 mg total) by mouth daily. 30 tablet 3   ondansetron (ZOFRAN ODT) 4 MG disintegrating tablet Take 1-2 tablets (4-8 mg total) by mouth every 6 (six) hours as needed for nausea or vomiting. 30 tablet 1   pantoprazole (PROTONIX) 40 MG tablet Take 1 tablet (40 mg total) by mouth daily. 30 tablet 3   polyethylene glycol powder (GLYCOLAX/MIRALAX) 17 GM/SCOOP powder Take 17 g by mouth 2 (two) times daily as needed. 3350 g 1   traMADol (ULTRAM) 50 MG tablet Take 1 tablet (50 mg total) by mouth every 8 (eight) hours as needed for moderate pain. 30 tablet 0   No current facility-administered medications for this visit.   Facility-Administered Medications Ordered in Other Visits  Medication Dose Route Frequency Provider Last Rate Last Admin   0.9 %  sodium chloride infusion   Intravenous Once [name], MD       0.9 %  sodium chloride infusion   Intravenous Continuous [name], MD   Stopped at 05/26/21 1156   acetaminophen (TYLENOL) 325 MG tablet            alteplase (CATHFLO ACTIVASE) injection 2 mg  2 mg Intracatheter Once PRN [name], MD       heparin lock flush 100 unit/mL  500 Units Intracatheter Once [name], MD       heparin lock flush 100 unit/mL  250 Units Intracatheter Once PRN [name], MD       magnesium sulfate 2 GM/50ML IVPB            magnesium sulfate 2 GM/50ML IVPB            palonosetron (ALOXI) 0.25 MG/5ML injection            sodium chloride flush (NS) 0.9 % injection 10 mL  10 mL Intracatheter Once [name], MD       sodium chloride flush (NS) 0.9 % injection 3 mL  3 mL Intracatheter Once PRN [name], MD        PHYSICAL EXAMINATION: ECOG PERFORMANCE STATUS: 3 - Symptomatic, >50% confined to bed  There were no vitals filed for this visit. Wt Readings from Last 3 Encounters:  05/26/21 148 lb 12  oz (67.5 kg)  05/12/21 150 lb 6.4 oz (68.2 kg)  04/21/21 152 lb 6.4 oz (69.1 kg)     GENERAL:alert, no distress and comfortable SKIN: skin color normal, no rashes or significant lesions EYES: normal, Conjunctiva are pink and non-injected, sclera clear  NEURO: alert & oriented x 3 with fluent speech  LABORATORY DATA:  I have reviewed the data as listed CBC Latest Ref Rng & Units 05/26/2021 05/12/2021 04/21/2021  WBC 4.0 - 10.5 K/uL 4.1 6.5 5.0  Hemoglobin 12.0 - 15.0  g/dL 8.9(L) 9.2(L) 9.4(L)  Hematocrit 36.0 - 46.0 % 27.9(L) 28.4(L) 29.6(L)  Platelets 150 - 400 K/uL 91(L) 115(L) 101(L)     CMP Latest Ref Rng & Units 05/26/2021 05/12/2021 04/21/2021  Glucose 70 - 99 mg/dL 181(H) 166(H) 185(H)  BUN 6 - 20 mg/dL 12 23(H) 14  Creatinine 0.44 - 1.00 mg/dL 0.66 0.73 0.68  Sodium 135 - 145 mmol/L 136 136 135  Potassium 3.5 - 5.1 mmol/L 3.8 4.1 4.0  Chloride 98 - 111 mmol/L 102 104 101  CO2 22 - 32 mmol/L _0 Calcium 8.9 - 10.3 mg/dL 9.1 9.0 9.0  Total Protein 6.5 - 8.1 g/dL 7.6 7.7 7.3  Total Bilirubin 0.3 - 1.2 mg/dL 0.4 0.4 0.3  Alkaline Phos 38 - 126 U/L 227(H) 236(H) 213(H)  AST 15 - 41 U/L 45(H) 36 34  ALT 0 - 44 U/L _1 RADIOGRAPHIC STUDIES: I have personally reviewed the radiological images as listed and agreed with the findings in the report. No results found.    No orders of the defined types were placed in this encounter.  All questions were answered. The patient knows to call the clinic with any problems, questions or concerns. No barriers to learning was detected. The total time spent in the appointment was 40 minutes.     [name], MD 05/26/2021   I, Wilburn Mylar, am acting as scribe for [name], MD.   I have reviewed the above documentation for accuracy and completeness, and I agree with the above.

## 2021-05-27 ENCOUNTER — Telehealth: Payer: Self-pay | Admitting: Hematology

## 2021-05-27 LAB — CANCER ANTIGEN 19-9: CA 19-9: 382 U/mL — ABNORMAL HIGH (ref 0–35)

## 2021-05-27 NOTE — Telephone Encounter (Signed)
Left message with follow-up appointments per 2/16 los.

## 2021-05-28 ENCOUNTER — Inpatient Hospital Stay: Payer: 59

## 2021-06-01 MED FILL — Dexamethasone Sodium Phosphate Inj 100 MG/10ML: INTRAMUSCULAR | Qty: 1 | Status: AC

## 2021-06-02 ENCOUNTER — Inpatient Hospital Stay: Payer: 59

## 2021-06-02 ENCOUNTER — Other Ambulatory Visit: Payer: Self-pay | Admitting: Hematology

## 2021-06-02 ENCOUNTER — Other Ambulatory Visit: Payer: Self-pay

## 2021-06-02 VITALS — BP 125/80 | HR 100 | Temp 98.3°F | Resp 18 | Ht 66.0 in | Wt 147.0 lb

## 2021-06-02 DIAGNOSIS — Z95828 Presence of other vascular implants and grafts: Secondary | ICD-10-CM

## 2021-06-02 DIAGNOSIS — C221 Intrahepatic bile duct carcinoma: Secondary | ICD-10-CM

## 2021-06-02 DIAGNOSIS — Z5111 Encounter for antineoplastic chemotherapy: Secondary | ICD-10-CM | POA: Diagnosis not present

## 2021-06-02 LAB — CMP (CANCER CENTER ONLY)
ALT: 21 U/L (ref 0–44)
AST: 51 U/L — ABNORMAL HIGH (ref 15–41)
Albumin: 3.4 g/dL — ABNORMAL LOW (ref 3.5–5.0)
Alkaline Phosphatase: 211 U/L — ABNORMAL HIGH (ref 38–126)
Anion gap: 5 (ref 5–15)
BUN: 13 mg/dL (ref 6–20)
CO2: 28 mmol/L (ref 22–32)
Calcium: 9 mg/dL (ref 8.9–10.3)
Chloride: 101 mmol/L (ref 98–111)
Creatinine: 0.57 mg/dL (ref 0.44–1.00)
GFR, Estimated: >60 mL/min (ref >60)
Glucose, Bld: 167 mg/dL — ABNORMAL HIGH (ref 70–99)
Potassium: 4.1 mmol/L (ref 3.5–5.1)
Sodium: 134 mmol/L — ABNORMAL LOW (ref 135–145)
Total Bilirubin: 0.4 mg/dL (ref 0.3–1.2)
Total Protein: 7.6 g/dL (ref 6.5–8.1)

## 2021-06-02 LAB — CBC WITH DIFFERENTIAL (CANCER CENTER ONLY)
Abs Immature Granulocytes: 0.02 10*3/uL (ref 0.00–0.07)
Basophils Absolute: 0 10*3/uL (ref 0.0–0.1)
Basophils Relative: 0 %
Eosinophils Absolute: 0.1 10*3/uL (ref 0.0–0.5)
Eosinophils Relative: 1 %
HCT: 26.8 % — ABNORMAL LOW (ref 36.0–46.0)
Hemoglobin: 8.6 g/dL — ABNORMAL LOW (ref 12.0–15.0)
Immature Granulocytes: 0 %
Lymphocytes Relative: 19 %
Lymphs Abs: 1 10*3/uL (ref 0.7–4.0)
MCH: 28.1 pg (ref 26.0–34.0)
MCHC: 32.1 g/dL (ref 30.0–36.0)
MCV: 87.6 fL (ref 80.0–100.0)
Monocytes Absolute: 0.4 10*3/uL (ref 0.1–1.0)
Monocytes Relative: 8 %
Neutro Abs: 3.8 10*3/uL (ref 1.7–7.7)
Neutrophils Relative %: 72 %
Platelet Count: 106 10*3/uL — ABNORMAL LOW (ref 150–400)
RBC: 3.06 MIL/uL — ABNORMAL LOW (ref 3.87–5.11)
RDW: 18.4 % — ABNORMAL HIGH (ref 11.5–15.5)
WBC Count: 5.4 10*3/uL (ref 4.0–10.5)
nRBC: 0 % (ref 0.0–0.2)

## 2021-06-02 MED ORDER — SODIUM CHLORIDE 0.9 % IV SOLN
400.0000 mg/m2 | Freq: Once | INTRAVENOUS | Status: AC
  Administered 2021-06-02: 708 mg via INTRAVENOUS
  Filled 2021-06-02: qty 17.5

## 2021-06-02 MED ORDER — PROCHLORPERAZINE MALEATE 10 MG PO TABS: 10.0000 mg | ORAL_TABLET | Freq: Four times a day (QID) | ORAL | 0 refills | Status: AC | PRN

## 2021-06-02 MED ORDER — SODIUM CHLORIDE 0.9 % IV SOLN
10.0000 mg | Freq: Once | INTRAVENOUS | Status: AC
  Administered 2021-06-02: 10 mg via INTRAVENOUS
  Filled 2021-06-02: qty 10

## 2021-06-02 MED ORDER — PALONOSETRON HCL INJECTION 0.25 MG/5ML
0.2500 mg | Freq: Once | INTRAVENOUS | Status: AC
  Administered 2021-06-02: 0.25 mg via INTRAVENOUS
  Filled 2021-06-02: qty 5

## 2021-06-02 MED ORDER — SODIUM CHLORIDE 0.9% FLUSH
10.0000 mL | Freq: Once | INTRAVENOUS | Status: AC
  Administered 2021-06-02: 10 mL

## 2021-06-02 MED ORDER — SODIUM CHLORIDE 0.9 % IV SOLN
2000.0000 mg/m2 | INTRAVENOUS | Status: DC
  Administered 2021-06-02: 3550 mg via INTRAVENOUS
  Filled 2021-06-02: qty 71

## 2021-06-02 MED ORDER — PROCHLORPERAZINE MALEATE 10 MG PO TABS
10.0000 mg | ORAL_TABLET | Freq: Once | ORAL | Status: AC
  Administered 2021-06-02: 10 mg via ORAL
  Filled 2021-06-02: qty 1

## 2021-06-02 MED ORDER — DIPHENOXYLATE-ATROPINE 2.5-0.025 MG PO TABS: 1.0000 | ORAL_TABLET | Freq: Four times a day (QID) | ORAL | 0 refills | Status: AC | PRN

## 2021-06-02 MED ORDER — SODIUM CHLORIDE 0.9 % IV SOLN: Freq: Once | INTRAVENOUS | Status: AC

## 2021-06-02 MED ORDER — ATROPINE SULFATE 1 MG/ML IV SOLN
0.5000 mg | Freq: Once | INTRAVENOUS | Status: AC | PRN
  Administered 2021-06-02: 0.5 mg via INTRAVENOUS
  Filled 2021-06-02: qty 1

## 2021-06-02 MED ORDER — SODIUM CHLORIDE 0.9 % IV SOLN
150.0000 mg/m2 | Freq: Once | INTRAVENOUS | Status: AC
  Administered 2021-06-02: 260 mg via INTRAVENOUS
  Filled 2021-06-02: qty 13

## 2021-06-02 NOTE — Patient Instructions (Signed)
[facility]  Discharge Instructions: Thank you for choosing [facility] to provide your oncology and hematology care.   If you have a lab appointment with the [facility], please go directly to the [facility] and check in at the registration area.   Wear comfortable clothing and clothing appropriate for easy access to any Portacath or PICC line.   We strive to give you quality time with your provider. You may need to reschedule your appointment if you arrive late (15 or more minutes).  Arriving late affects you and other patients whose appointments are after yours.  Also, if you miss three or more appointments without notifying the office, you may be dismissed from the clinic at the providers discretion.      For prescription refill requests, have your pharmacy contact our office and allow 72 hours for refills to be completed.    Today you received the following chemotherapy and/or immunotherapy agents: Leucovorin, Irinotecan , Fluorouracil.      To help prevent nausea and vomiting after your treatment, we encourage you to take your nausea medication as directed.  BELOW ARE SYMPTOMS THAT SHOULD BE REPORTED IMMEDIATELY: *FEVER GREATER THAN 100.4 F (38 C) OR HIGHER *CHILLS OR SWEATING *NAUSEA AND VOMITING THAT IS NOT CONTROLLED WITH YOUR NAUSEA MEDICATION *UNUSUAL SHORTNESS OF BREATH *UNUSUAL BRUISING OR BLEEDING *URINARY PROBLEMS (pain or burning when urinating, or frequent urination) *BOWEL PROBLEMS (unusual diarrhea, constipation, pain near the anus) TENDERNESS IN MOUTH AND THROAT WITH OR WITHOUT PRESENCE OF ULCERS (sore throat, sores in mouth, or a toothache) UNUSUAL RASH, SWELLING OR PAIN  UNUSUAL VAGINAL DISCHARGE OR ITCHING   Items with * indicate a potential emergency and should be followed up as soon as possible or go to the Emergency Department if any problems should occur.  Please show the CHEMOTHERAPY ALERT CARD or  IMMUNOTHERAPY ALERT CARD at check-in to the Emergency Department and triage nurse.  Should you have questions after your visit or need to cancel or reschedule your appointment, please contact [facility]  Dept: [phone]  and follow the prompts.  Office hours are 8:00 a.m. to 4:30 p.m. Monday - Friday. Please note that voicemails left after 4:00 p.m. may not be returned until the following business day.  We are closed weekends and major holidays. You have access to a nurse at all times for urgent questions. Please call the main number to the clinic Dept: [phone] and follow the prompts.   For any non-urgent questions, you may also contact your provider using MyChart. We now offer e-Visits for anyone 9 and older to request care online for non-urgent symptoms. For details visit [email or web address].   Also download the MyChart app! Go to the app store, search "MyChart", open the app, select [facility], and log in with your MyChart username and password.  Due to Covid, a mask is required upon entering the hospital/clinic. If you do not have a mask, one will be given to you upon arrival. For doctor visits, patients may have 1 support person aged 75 or older with them. For treatment visits, patients cannot have anyone with them due to current Covid guidelines and our immunocompromised population.

## 2021-06-03 LAB — CANCER ANTIGEN 19-9: CA 19-9: 337 U/mL — ABNORMAL HIGH (ref 0–35)

## 2021-06-04 ENCOUNTER — Inpatient Hospital Stay: Payer: 59

## 2021-06-04 ENCOUNTER — Other Ambulatory Visit: Payer: Self-pay

## 2021-06-04 VITALS — BP 104/50 | HR 117 | Temp 97.9°F

## 2021-06-04 DIAGNOSIS — Z5111 Encounter for antineoplastic chemotherapy: Secondary | ICD-10-CM | POA: Diagnosis not present

## 2021-06-04 MED ORDER — SODIUM CHLORIDE 0.9% FLUSH
10.0000 mL | INTRAVENOUS | Status: DC | PRN
  Administered 2021-06-04: 10 mL

## 2021-06-07 ENCOUNTER — Telehealth: Payer: Self-pay

## 2021-06-07 NOTE — Telephone Encounter (Signed)
-----   Message from Anastasia Pall, RN sent at 06/02/2021  4:43 PM EST ----- Regarding: 1st time irinotecan follow-up, being followed by [name] Patient received 1st time irinotecan infusion today. Complained of weakness and nausea at end of infusion, was given 10mg  of PO compazine. Being seen by [name]. Please follow-up tomorrow, thank you.

## 2021-06-07 NOTE — Telephone Encounter (Signed)
This nurse reached out to patient with the use of [employer], spoke with her spouse who verified that she is doing fine since having her first infusion.  She did have a little fever and headache once she arrived home that day but that has subsided and she is doing very well.  This nurse encouraged husband to make sure that patient drinks plenty of fluids and if the fever returns or other symptoms please go to [facility] or contact our office.  He acknowledged understanding.  No further questions or concerns at this time.

## 2021-06-08 ENCOUNTER — Encounter: Payer: Self-pay | Admitting: Hematology

## 2021-06-09 ENCOUNTER — Inpatient Hospital Stay: Payer: 59

## 2021-06-09 ENCOUNTER — Inpatient Hospital Stay: Payer: 59 | Admitting: Hematology

## 2021-06-15 MED FILL — Dexamethasone Sodium Phosphate Inj 100 MG/10ML: INTRAMUSCULAR | Qty: 1 | Status: AC

## 2021-06-16 ENCOUNTER — Telehealth: Payer: Self-pay | Admitting: Hematology

## 2021-06-16 ENCOUNTER — Inpatient Hospital Stay: Payer: 59

## 2021-06-16 ENCOUNTER — Inpatient Hospital Stay: Payer: 59 | Admitting: Hematology

## 2021-06-16 NOTE — Telephone Encounter (Signed)
Cancelled today's appointment due to patient not available. Rescheduled upcoming appointment per patient's niece's request. Patient's niece is aware of changes. ?

## 2021-06-24 ENCOUNTER — Encounter (HOSPITAL_COMMUNITY): Payer: Self-pay | Admitting: Emergency Medicine

## 2021-06-24 ENCOUNTER — Emergency Department (HOSPITAL_COMMUNITY)
Admission: EM | Admit: 2021-06-24 | Discharge: 2021-06-25 | Disposition: A | Discharge status: Home / Self Care | Payer: 59 | Attending: Emergency Medicine | Admitting: Emergency Medicine

## 2021-06-24 ENCOUNTER — Other Ambulatory Visit: Payer: Self-pay

## 2021-06-24 ENCOUNTER — Emergency Department (HOSPITAL_COMMUNITY): Payer: 59

## 2021-06-24 DIAGNOSIS — E871 Hypo-osmolality and hyponatremia: Secondary | ICD-10-CM | POA: Insufficient documentation

## 2021-06-24 DIAGNOSIS — R000 Tachycardia, unspecified: Secondary | ICD-10-CM | POA: Insufficient documentation

## 2021-06-24 DIAGNOSIS — R509 Fever, unspecified: Secondary | ICD-10-CM | POA: Diagnosis present

## 2021-06-24 DIAGNOSIS — I10 Essential (primary) hypertension: Secondary | ICD-10-CM | POA: Diagnosis not present

## 2021-06-24 DIAGNOSIS — D649 Anemia, unspecified: Secondary | ICD-10-CM | POA: Insufficient documentation

## 2021-06-24 DIAGNOSIS — Z79899 Other long term (current) drug therapy: Secondary | ICD-10-CM | POA: Diagnosis not present

## 2021-06-24 DIAGNOSIS — Z8505 Personal history of malignant neoplasm of liver: Secondary | ICD-10-CM | POA: Diagnosis not present

## 2021-06-24 DIAGNOSIS — B349 Viral infection, unspecified: Secondary | ICD-10-CM | POA: Diagnosis not present

## 2021-06-24 DIAGNOSIS — R791 Abnormal coagulation profile: Secondary | ICD-10-CM | POA: Diagnosis not present

## 2021-06-24 DIAGNOSIS — R7401 Elevation of levels of liver transaminase levels: Secondary | ICD-10-CM | POA: Insufficient documentation

## 2021-06-24 DIAGNOSIS — Z20822 Contact with and (suspected) exposure to covid-19: Secondary | ICD-10-CM | POA: Insufficient documentation

## 2021-06-24 MED ORDER — HYDROMORPHONE HCL 1 MG/ML IJ SOLN
0.5000 mg | Freq: Once | INTRAMUSCULAR | Status: AC
  Administered 2021-06-24: 0.5 mg via INTRAVENOUS
  Filled 2021-06-24: qty 1

## 2021-06-24 MED ORDER — SODIUM CHLORIDE 0.9 % IV BOLUS
1000.0000 mL | Freq: Once | INTRAVENOUS | Status: AC
  Administered 2021-06-24: 1000 mL via INTRAVENOUS

## 2021-06-24 MED ORDER — ONDANSETRON HCL 4 MG/2ML IJ SOLN
4.0000 mg | Freq: Once | INTRAMUSCULAR | Status: AC
  Administered 2021-06-24: 4 mg via INTRAVENOUS
  Filled 2021-06-24: qty 2

## 2021-06-24 NOTE — ED Triage Notes (Signed)
Pt returned from [place] this week and began to be sick on the plane. Pt has n/v/d and body aches as well as cough and fever.  ?

## 2021-06-24 NOTE — ED Provider Triage Note (Signed)
Emergency Medicine Provider Triage Evaluation Note ? ?Crystal Gibbs , a 46 y.o. female  was evaluated in triage.  Pt complains of fevers only relieved with Tylenol, productive cough, abdominal pain, nausea, vomiting and diarrhea which has been ongoing for the last 5 days.  Patient states that she just got back from [place] 4 days ago and her symptoms began on the plane.  No other known sick contacts.  Denies any hematemesis or hematochezia.  Denies any dysuria.  Has had very poor p.o. intake over the last several days. ? ?Review of Systems  ?Positive: As above ?Negative: As above ? ?Physical Exam  ?There were no vitals taken for this visit. ?Gen:   Awake, no distress   ?Resp:  Normal effort  ?MSK:   Moves extremities without difficulty  ?Other:  Generalized abdominal tenderness ? ?Medical Decision Making  ?Medically screening exam initiated at 9:31 PM.  Appropriate orders placed.  Crystal Gibbs was informed that the remainder of the evaluation will be completed by another provider, this initial triage assessment does not replace that evaluation, and the importance of remaining in the ED until their evaluation is complete. ? ?CBC, CMP, lipase, chest x-ray and COVID test to initiate factious work-up ?  ?Garald Balding, PA-C ?06/24/21 2133 ? ?

## 2021-06-25 ENCOUNTER — Emergency Department (HOSPITAL_COMMUNITY): Payer: 59

## 2021-06-25 ENCOUNTER — Encounter (HOSPITAL_COMMUNITY): Payer: Self-pay

## 2021-06-25 LAB — URINALYSIS, ROUTINE W REFLEX MICROSCOPIC
Glucose, UA: NEGATIVE mg/dL
Hgb urine dipstick: NEGATIVE
Ketones, ur: NEGATIVE mg/dL
Leukocytes,Ua: NEGATIVE
Nitrite: NEGATIVE
Specific Gravity, Urine: 1.01 (ref 1.005–1.030)
pH: 6 (ref 5.0–8.0)

## 2021-06-25 LAB — CBC WITH DIFFERENTIAL/PLATELET
Abs Immature Granulocytes: 0.1 10*3/uL — ABNORMAL HIGH (ref 0.00–0.07)
Basophils Absolute: 0 10*3/uL (ref 0.0–0.1)
Basophils Relative: 0 %
Eosinophils Absolute: 0 10*3/uL (ref 0.0–0.5)
Eosinophils Relative: 0 %
HCT: 27.2 % — ABNORMAL LOW (ref 36.0–46.0)
Hemoglobin: 8.7 g/dL — ABNORMAL LOW (ref 12.0–15.0)
Immature Granulocytes: 1 %
Lymphocytes Relative: 19 %
Lymphs Abs: 1.4 10*3/uL (ref 0.7–4.0)
MCH: 28.5 pg (ref 26.0–34.0)
MCHC: 32 g/dL (ref 30.0–36.0)
MCV: 89.2 fL (ref 80.0–100.0)
Monocytes Absolute: 0.9 10*3/uL (ref 0.1–1.0)
Monocytes Relative: 13 %
Neutro Abs: 4.9 10*3/uL (ref 1.7–7.7)
Neutrophils Relative %: 67 %
Platelets: 143 10*3/uL — ABNORMAL LOW (ref 150–400)
RBC: 3.05 MIL/uL — ABNORMAL LOW (ref 3.87–5.11)
RDW: 18.7 % — ABNORMAL HIGH (ref 11.5–15.5)
WBC: 7.3 10*3/uL (ref 4.0–10.5)
nRBC: 0 % (ref 0.0–0.2)

## 2021-06-25 LAB — RESP PANEL BY RT-PCR (FLU A&B, COVID) ARPGX2
Influenza A by PCR: NEGATIVE
Influenza B by PCR: NEGATIVE
SARS Coronavirus 2 by RT PCR: NEGATIVE

## 2021-06-25 LAB — COMPREHENSIVE METABOLIC PANEL
ALT: 31 U/L (ref 0–44)
AST: 63 U/L — ABNORMAL HIGH (ref 15–41)
Albumin: 2.8 g/dL — ABNORMAL LOW (ref 3.5–5.0)
Alkaline Phosphatase: 262 U/L — ABNORMAL HIGH (ref 38–126)
Anion gap: 12 (ref 5–15)
BUN: 13 mg/dL (ref 6–20)
CO2: 19 mmol/L — ABNORMAL LOW (ref 22–32)
Calcium: 8.2 mg/dL — ABNORMAL LOW (ref 8.9–10.3)
Chloride: 98 mmol/L (ref 98–111)
Creatinine, Ser: 0.72 mg/dL (ref 0.44–1.00)
GFR, Estimated: >60 mL/min (ref >60)
Glucose, Bld: 86 mg/dL (ref 70–99)
Potassium: 4.4 mmol/L (ref 3.5–5.1)
Sodium: 129 mmol/L — ABNORMAL LOW (ref 135–145)
Total Bilirubin: 0.9 mg/dL (ref 0.3–1.2)
Total Protein: 7.4 g/dL (ref 6.5–8.1)

## 2021-06-25 LAB — TROPONIN I (HIGH SENSITIVITY)
Troponin I (High Sensitivity): 10 ng/L (ref <18)
Troponin I (High Sensitivity): 13 ng/L (ref <18)

## 2021-06-25 LAB — LIPASE, BLOOD: Lipase: 29 U/L (ref 11–51)

## 2021-06-25 LAB — URINALYSIS, MICROSCOPIC (REFLEX)
Bacteria, UA: NONE SEEN
RBC / HPF: NONE SEEN RBC/hpf (ref 0–5)

## 2021-06-25 LAB — PREGNANCY, URINE: Preg Test, Ur: NEGATIVE

## 2021-06-25 LAB — D-DIMER, QUANTITATIVE: D-Dimer, Quant: 11.99 ug/mL-FEU — ABNORMAL HIGH (ref 0.00–0.50)

## 2021-06-25 MED ORDER — AZITHROMYCIN 250 MG PO TABS: 250.0000 mg | ORAL_TABLET | Freq: Every day | ORAL | 0 refills | Status: DC

## 2021-06-25 MED ORDER — ONDANSETRON HCL 4 MG PO TABS: 4.0000 mg | ORAL_TABLET | Freq: Four times a day (QID) | ORAL | 0 refills | Status: DC

## 2021-06-25 MED ORDER — IOHEXOL 350 MG/ML SOLN
100.0000 mL | Freq: Once | INTRAVENOUS | Status: AC | PRN
  Administered 2021-06-25: 100 mL via INTRAVENOUS

## 2021-06-25 MED ORDER — SODIUM CHLORIDE 0.9 % IV BOLUS
1000.0000 mL | Freq: Once | INTRAVENOUS | Status: AC
  Administered 2021-06-25: 1000 mL via INTRAVENOUS

## 2021-06-25 MED ORDER — OXYCODONE HCL 5 MG PO TABS: 5.0000 mg | ORAL_TABLET | Freq: Three times a day (TID) | ORAL | 0 refills | Status: AC | PRN

## 2021-06-25 NOTE — ED Notes (Signed)
Patient updated on plan of care

## 2021-06-25 NOTE — Discharge Instructions (Signed)
Likely a viral infection, recommend over-the-counter pain medications like ibuprofen  for fever and pain control, nasal decongestions like Flonase and Zyrtec, Mucinex for cough.  If not eating recommend supplementing with Gatorade to help with electrolyte supplementation.  I will give you Zofran for nausea please take as prescribed.  I have also started you on an antibiotic please take as prescribed. ? ?I have given you some pain medication, oxycodone please do not take with tramadol as it can make you very sleepy, only take one or the other for pain control.  This medication can make you drowsy do not consume alcohol or operate heavy machinery when taking this medication.  ? ?I would like you to follow-up with your oncology doctor for further management of your pain as well as reevaluation URI-like symptoms. ? ? ?Come back to the emergency department if you develop chest pain, shortness of breath, severe abdominal pain, uncontrolled nausea, vomiting, diarrhea. ? ? ?

## 2021-06-25 NOTE — ED Provider Notes (Signed)
?[facility] DEPT ?Provider Note ? ? ?CSN: [id number] ?Arrival date & time: 06/24/21  2049 ? ?  ? ?History ? ?Chief Complaint  ?Patient presents with  ? Fever  ? Abdominal Pain  ? ? ?Crystal Gibbs is a 46 y.o. female. ? ?HPI ? ?Interpreter was used to collect HPI ? ?Patient with medical history including metastatic carcinoma of the liver, hypertension, presents with complaints of URI-like symptoms.  Patient states that she recent got back from [place] about 4 days ago upon her arrival back in the [place] she started develop fevers chills cough congestion nausea vomiting as well as diarrhea.  She states that she has a productive cough, denies bloody sputum, she denies hematemesis or coffee-ground emesis, she endorses shortness of breath with pleuritic chest pain which is worse with movement and coughing denies any actual chest pain.  she also has right upper quadrant tenderness, has been worse over the last couple days, does not radiate into her back, remains focalized, she has had multiple bouts of diarrhea no melena or hematochezia, she denies any antibiotic use, no recent hospitalizations, no history of C. difficile.  Patient is not vaccinated COVID or influenza, denies recent sick contacts, she is up-to-date on all childhood vaccines.  Patient has been using over-the-counter flu and cold medicine over the last couple days she is also been using some antibiotics that she brought back from [place] cefime 2 times daily for last 3 days without much relief ? 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  She is a nonsurgical candidate currently not on anticoag's. ? ?Home Medications ?Prior to Admission medications   ?Medication Sig Start Date End Date Taking? Authorizing Provider  ?acetaminophen (TYLENOL) 650 MG CR tablet Take 650 mg by mouth every  8 (eight) hours as needed for pain or fever.   Yes [provider]  ?amLODipine (NORVASC) 2.5 MG tablet Take 1 tablet (2.5 mg total) by mouth daily. 02/24/21  Yes [name], MD  ?azithromycin (ZITHROMAX) 250 MG tablet Take 1 tablet (250 mg total) by mouth daily. Take first 2 tablets together, then 1 every day until finished. 06/25/21  Yes Marcello Fennel, PA-C  ?dexamethasone (DECADRON) 4 MG tablet Take 2 tablets at breakfast for 3 days, starting the day after cisplatin 08/16/20  Yes Alla Feeling, NP  ?diphenoxylate-atropine (LOMOTIL) 2.5-0.025 MG tablet Take 1-2 tablets by mouth 4 (four) times daily as needed for diarrhea or loose stools. 06/02/21  Yes [name], MD  ?DM-Doxylamine-Acetaminophen (NYQUIL COLD & FLU PO) Take 15 mLs by mouth 2 (two) times daily as needed (for pain and fever).   Yes [provider]  ?lidocaine-prilocaine (EMLA) cream Apply 1 application topically as needed. 05/26/21  Yes [name], MD  ?ondansetron (ZOFRAN) 4 MG tablet Take 1 tablet (4 mg total) by mouth every 6 (six) hours. 06/25/21  Yes Marcello Fennel, PA-C  ?oxyCODONE (ROXICODONE) 5 MG immediate release tablet Take 1 tablet (5 mg total) by mouth every 8 (eight) hours as needed for up to 3 days for severe pain. 06/25/21 06/28/21 Yes Marcello Fennel, PA-C  ?pantoprazole (PROTONIX) 40 MG tablet Take 1 tablet (40 mg total) by mouth daily. 12/16/20  Yes [name], MD  ?PRESCRIPTION MEDICATION Take 400 mg by mouth 2 (two) times daily. Cefim '400mg'$  capsules ([place]) ?SN: [id number] ?Lot: [id number]   Yes [provider]  ?  traMADol (ULTRAM) 50 MG tablet Take 1 tablet (50 mg total) by mouth every 8 (eight) hours as needed for moderate pain. 04/21/21  Yes Alla Feeling, NP  ?b complex vitamins capsule Take 1 capsule by mouth daily. ?Patient not taking: Reported on 06/25/2021 04/21/21   Alla Feeling, NP  ?diclofenac Sodium (VOLTAREN) 1 % GEL Apply 4 g topically 4 (four) times daily. ?Patient not taking:  Reported on 06/25/2021 09/08/20   Simmons-Robinson, Riki Sheer, MD  ?gabapentin (NEURONTIN) 100 MG capsule Take 1 capsule (100 mg total) by mouth 2 (two) times daily. ?Patient not taking: Reported on 06/25/2021 05/12/21   [name], MD  ?magnesium oxide (MAG-OX) 400 (240 Mg) MG tablet Take 1 tablet (400 mg total) by mouth daily. ?Patient not taking: Reported on 06/25/2021 12/16/20   [name], MD  ?megestrol (MEGACE ES) 625 MG/5ML suspension Take 5 mLs (625 mg total) by mouth daily. ?Patient not taking: Reported on 06/25/2021 05/26/21   [name], MD  ?ondansetron (ZOFRAN ODT) 4 MG disintegrating tablet Take 1-2 tablets (4-8 mg total) by mouth every 6 (six) hours as needed for nausea or vomiting. ?Patient not taking: Reported on 06/25/2021 03/24/21   [name], MD  ?polyethylene glycol powder (GLYCOLAX/MIRALAX) 17 GM/SCOOP powder Take 17 g by mouth 2 (two) times daily as needed. ?Patient not taking: Reported on 06/25/2021 11/01/20   Simmons-Robinson, Riki Sheer, MD  ?prochlorperazine (COMPAZINE) 10 MG tablet Take 1 tablet (10 mg total) by mouth every 6 (six) hours as needed for nausea or vomiting. ?Patient not taking: Reported on 06/25/2021 06/02/21   [name], MD  ?   ? ?Allergies    ?Patient has no known allergies.   ? ?Review of Systems   ?Review of Systems  ?Constitutional:  Positive for chills, fatigue and fever.  ?HENT:  Positive for congestion.   ?Respiratory:  Positive for cough and shortness of breath.   ?Cardiovascular:  Negative for chest pain.  ?Gastrointestinal:  Positive for abdominal pain, diarrhea, nausea and vomiting.  ?Musculoskeletal:  Negative for myalgias.  ?Neurological:  Negative for headaches.  ? ?Physical Exam ?Updated Vital Signs ?BP 107/62 (BP Location: Left Arm)   Pulse 97   Temp 99.6 ?F (37.6 ?C) (Oral)   Resp 18   SpO2 99%  ?Physical Exam ?Vitals and nursing note reviewed.  ?Constitutional:   ?   General: She is not in acute distress. ?   Appearance: She is ill-appearing.  ?   Comments: Patient is  ill-appearing, deconditioned status, no acute distress  ?HENT:  ?   Head: Normocephalic and atraumatic.  ?   Nose: No congestion.  ?   Mouth/Throat:  ?   Mouth: Mucous membranes are dry.  ?   Pharynx: Oropharynx is clear. No oropharyngeal exudate or posterior oropharyngeal erythema.  ?Eyes:  ?   Extraocular Movements: Extraocular movements intact.  ?   Conjunctiva/sclera: Conjunctivae normal.  ?Cardiovascular:  ?   Rate and Rhythm: Regular rhythm. Tachycardia present.  ?   Pulses: Normal pulses.  ?   Heart sounds: No murmur heard. ?  No friction rub. No gallop.  ?Pulmonary:  ?   Effort: No respiratory distress.  ?   Breath sounds: No wheezing, rhonchi or rales.  ?   Comments: Patient slightly tachypneic on my exam but she is able to speak in full sentences, no signs of respiratory distress, nonhypoxic, l patient noted diminished lung sounds bilaterally in the lower lobes no abnormalities noted in the upper lobes. ?Abdominal:  ?  Palpations: Abdomen is soft.  ?   Tenderness: There is abdominal tenderness. There is no right CVA tenderness or left CVA tenderness.  ?   Comments: Abdomen nondistended normal active bowel sounds dull to percussion, had noted right upper quadrant tenderness, there is no guarding, rebound touch, peritoneal sign negative Murphy sign McBurney point.  ?Skin: ?   General: Skin is warm and dry.  ?   Comments: Patient's Port-A-Cath noted in the right upper chest no signs infection present  ?Neurological:  ?   Mental Status: She is alert.  ?Psychiatric:     ?   Mood and Affect: Mood normal.  ? ? ?ED Results / Procedures / Treatments   ?Labs ?(all labs ordered are listed, but only abnormal results are displayed) ?Labs Reviewed  ?CBC WITH DIFFERENTIAL/PLATELET - Abnormal; Notable for the following components:  ?    Result Value  ? RBC 3.05 (*)   ? Hemoglobin 8.7 (*)   ? HCT 27.2 (*)   ? RDW 18.7 (*)   ? Platelets 143 (*)   ? Abs Immature Granulocytes 0.10 (*)   ? All other components within normal  limits  ?COMPREHENSIVE METABOLIC PANEL - Abnormal; Notable for the following components:  ? Sodium 129 (*)   ? CO2 19 (*)   ? Calcium 8.2 (*)   ? Albumin 2.8 (*)   ? AST 63 (*)   ? Alkaline Phosphatase 262 (*)

## 2021-06-28 ENCOUNTER — Other Ambulatory Visit: Payer: Self-pay

## 2021-06-28 ENCOUNTER — Other Ambulatory Visit: Payer: Self-pay | Admitting: Hematology

## 2021-06-28 ENCOUNTER — Encounter (HOSPITAL_COMMUNITY): Payer: Self-pay

## 2021-06-28 ENCOUNTER — Emergency Department (HOSPITAL_COMMUNITY): Payer: 59

## 2021-06-28 ENCOUNTER — Inpatient Hospital Stay (HOSPITAL_COMMUNITY)
Admission: EM | Admit: 2021-06-28 | Discharge: 2021-07-03 | DRG: 871 | Disposition: A | Discharge status: Home / Self Care | Payer: 59 | Attending: Internal Medicine | Admitting: Internal Medicine

## 2021-06-28 DIAGNOSIS — Z20822 Contact with and (suspected) exposure to covid-19: Secondary | ICD-10-CM | POA: Diagnosis present

## 2021-06-28 DIAGNOSIS — E872 Acidosis, unspecified: Secondary | ICD-10-CM | POA: Diagnosis present

## 2021-06-28 DIAGNOSIS — I10 Essential (primary) hypertension: Secondary | ICD-10-CM | POA: Diagnosis present

## 2021-06-28 DIAGNOSIS — Z803 Family history of malignant neoplasm of breast: Secondary | ICD-10-CM

## 2021-06-28 DIAGNOSIS — A419 Sepsis, unspecified organism: Secondary | ICD-10-CM | POA: Diagnosis present

## 2021-06-28 DIAGNOSIS — C221 Intrahepatic bile duct carcinoma: Secondary | ICD-10-CM | POA: Diagnosis present

## 2021-06-28 DIAGNOSIS — T451X5A Adverse effect of antineoplastic and immunosuppressive drugs, initial encounter: Secondary | ICD-10-CM | POA: Diagnosis present

## 2021-06-28 DIAGNOSIS — J189 Pneumonia, unspecified organism: Secondary | ICD-10-CM | POA: Diagnosis present

## 2021-06-28 DIAGNOSIS — Z79899 Other long term (current) drug therapy: Secondary | ICD-10-CM

## 2021-06-28 DIAGNOSIS — D6481 Anemia due to antineoplastic chemotherapy: Secondary | ICD-10-CM | POA: Diagnosis present

## 2021-06-28 DIAGNOSIS — R7401 Elevation of levels of liver transaminase levels: Secondary | ICD-10-CM | POA: Diagnosis present

## 2021-06-28 DIAGNOSIS — Z515 Encounter for palliative care: Secondary | ICD-10-CM

## 2021-06-28 DIAGNOSIS — L299 Pruritus, unspecified: Secondary | ICD-10-CM | POA: Diagnosis present

## 2021-06-28 DIAGNOSIS — Y95 Nosocomial condition: Secondary | ICD-10-CM | POA: Diagnosis present

## 2021-06-28 DIAGNOSIS — C7800 Secondary malignant neoplasm of unspecified lung: Secondary | ICD-10-CM | POA: Diagnosis present

## 2021-06-28 DIAGNOSIS — M549 Dorsalgia, unspecified: Secondary | ICD-10-CM | POA: Diagnosis not present

## 2021-06-28 DIAGNOSIS — Z800 Family history of malignant neoplasm of digestive organs: Secondary | ICD-10-CM

## 2021-06-28 DIAGNOSIS — M7989 Other specified soft tissue disorders: Secondary | ICD-10-CM | POA: Diagnosis present

## 2021-06-28 DIAGNOSIS — E785 Hyperlipidemia, unspecified: Secondary | ICD-10-CM | POA: Diagnosis present

## 2021-06-28 DIAGNOSIS — R180 Malignant ascites: Secondary | ICD-10-CM | POA: Diagnosis present

## 2021-06-28 DIAGNOSIS — F419 Anxiety disorder, unspecified: Secondary | ICD-10-CM | POA: Diagnosis present

## 2021-06-28 DIAGNOSIS — R000 Tachycardia, unspecified: Secondary | ICD-10-CM

## 2021-06-28 DIAGNOSIS — D630 Anemia in neoplastic disease: Secondary | ICD-10-CM | POA: Diagnosis present

## 2021-06-28 DIAGNOSIS — Z8042 Family history of malignant neoplasm of prostate: Secondary | ICD-10-CM

## 2021-06-28 DIAGNOSIS — D638 Anemia in other chronic diseases classified elsewhere: Secondary | ICD-10-CM

## 2021-06-28 DIAGNOSIS — G4700 Insomnia, unspecified: Secondary | ICD-10-CM | POA: Diagnosis present

## 2021-06-28 DIAGNOSIS — R6521 Severe sepsis with septic shock: Secondary | ICD-10-CM | POA: Diagnosis present

## 2021-06-28 DIAGNOSIS — Z7189 Other specified counseling: Secondary | ICD-10-CM | POA: Diagnosis not present

## 2021-06-28 DIAGNOSIS — Z833 Family history of diabetes mellitus: Secondary | ICD-10-CM | POA: Diagnosis not present

## 2021-06-28 LAB — I-STAT CHEM 8, ED
BUN: 5 mg/dL — ABNORMAL LOW (ref 6–20)
Calcium, Ion: 1.11 mmol/L — ABNORMAL LOW (ref 1.15–1.40)
Chloride: 102 mmol/L (ref 98–111)
Creatinine, Ser: 0.7 mg/dL (ref 0.44–1.00)
Glucose, Bld: 100 mg/dL — ABNORMAL HIGH (ref 70–99)
HCT: 28 % — ABNORMAL LOW (ref 36.0–46.0)
Hemoglobin: 9.5 g/dL — ABNORMAL LOW (ref 12.0–15.0)
Potassium: 4.5 mmol/L (ref 3.5–5.1)
Sodium: 135 mmol/L (ref 135–145)
TCO2: 22 mmol/L (ref 22–32)

## 2021-06-28 LAB — RESP PANEL BY RT-PCR (FLU A&B, COVID) ARPGX2
Influenza A by PCR: NEGATIVE
Influenza B by PCR: NEGATIVE
SARS Coronavirus 2 by RT PCR: NEGATIVE

## 2021-06-28 LAB — CBC WITH DIFFERENTIAL/PLATELET
Abs Immature Granulocytes: 0.16 10*3/uL — ABNORMAL HIGH (ref 0.00–0.07)
Basophils Absolute: 0 10*3/uL (ref 0.0–0.1)
Basophils Relative: 0 %
Eosinophils Absolute: 0 10*3/uL (ref 0.0–0.5)
Eosinophils Relative: 0 %
HCT: 27.7 % — ABNORMAL LOW (ref 36.0–46.0)
Hemoglobin: 8.7 g/dL — ABNORMAL LOW (ref 12.0–15.0)
Immature Granulocytes: 1 %
Lymphocytes Relative: 11 %
Lymphs Abs: 1.3 10*3/uL (ref 0.7–4.0)
MCH: 28.1 pg (ref 26.0–34.0)
MCHC: 31.4 g/dL (ref 30.0–36.0)
MCV: 89.4 fL (ref 80.0–100.0)
Monocytes Absolute: 1.2 10*3/uL — ABNORMAL HIGH (ref 0.1–1.0)
Monocytes Relative: 10 %
Neutro Abs: 8.8 10*3/uL — ABNORMAL HIGH (ref 1.7–7.7)
Neutrophils Relative %: 78 %
Platelets: 209 10*3/uL (ref 150–400)
RBC: 3.1 MIL/uL — ABNORMAL LOW (ref 3.87–5.11)
RDW: 20.2 % — ABNORMAL HIGH (ref 11.5–15.5)
WBC: 11.4 10*3/uL — ABNORMAL HIGH (ref 4.0–10.5)
nRBC: 0 % (ref 0.0–0.2)

## 2021-06-28 LAB — BASIC METABOLIC PANEL
Anion gap: 10 (ref 5–15)
BUN: 8 mg/dL (ref 6–20)
CO2: 20 mmol/L — ABNORMAL LOW (ref 22–32)
Calcium: 8.4 mg/dL — ABNORMAL LOW (ref 8.9–10.3)
Chloride: 102 mmol/L (ref 98–111)
Creatinine, Ser: 0.78 mg/dL (ref 0.44–1.00)
GFR, Estimated: >60 mL/min (ref >60)
Glucose, Bld: 98 mg/dL (ref 70–99)
Potassium: 4.3 mmol/L (ref 3.5–5.1)
Sodium: 132 mmol/L — ABNORMAL LOW (ref 135–145)

## 2021-06-28 LAB — HEPATIC FUNCTION PANEL
ALT: 40 U/L (ref 0–44)
AST: 98 U/L — ABNORMAL HIGH (ref 15–41)
Albumin: 2.1 g/dL — ABNORMAL LOW (ref 3.5–5.0)
Alkaline Phosphatase: 277 U/L — ABNORMAL HIGH (ref 38–126)
Bilirubin, Direct: 0.6 mg/dL — ABNORMAL HIGH (ref 0.0–0.2)
Indirect Bilirubin: 0.5 mg/dL (ref 0.3–0.9)
Total Bilirubin: 1.1 mg/dL (ref 0.3–1.2)
Total Protein: 6.2 g/dL — ABNORMAL LOW (ref 6.5–8.1)

## 2021-06-28 LAB — LIPASE, BLOOD: Lipase: 23 U/L (ref 11–51)

## 2021-06-28 LAB — LACTIC ACID, PLASMA
Lactic Acid, Venous: 2.5 mmol/L (ref 0.5–1.9)
Lactic Acid, Venous: 1.6 mmol/L (ref 0.5–1.9)

## 2021-06-28 LAB — BRAIN NATRIURETIC PEPTIDE: B Natriuretic Peptide: 52.8 pg/mL (ref 0.0–100.0)

## 2021-06-28 LAB — PHOSPHORUS: Phosphorus: 3.2 mg/dL (ref 2.5–4.6)

## 2021-06-28 LAB — PROCALCITONIN: Procalcitonin: 3.24 ng/mL

## 2021-06-28 LAB — PROTIME-INR
INR: 1.3 — ABNORMAL HIGH (ref 0.8–1.2)
Prothrombin Time: 16 seconds — ABNORMAL HIGH (ref 11.4–15.2)

## 2021-06-28 LAB — CK: Total CK: 34 U/L — ABNORMAL LOW (ref 38–234)

## 2021-06-28 LAB — TROPONIN I (HIGH SENSITIVITY)
Troponin I (High Sensitivity): 18 ng/L — ABNORMAL HIGH (ref <18)
Troponin I (High Sensitivity): 14 ng/L (ref <18)

## 2021-06-28 LAB — TSH: TSH: 1.595 u[IU]/mL (ref 0.350–4.500)

## 2021-06-28 LAB — MAGNESIUM: Magnesium: 1.4 mg/dL — ABNORMAL LOW (ref 1.7–2.4)

## 2021-06-28 MED ORDER — VANCOMYCIN HCL 1250 MG/250ML IV SOLN
1250.0000 mg | Freq: Once | INTRAVENOUS | Status: AC
  Administered 2021-06-28: 1250 mg via INTRAVENOUS
  Filled 2021-06-28: qty 250

## 2021-06-28 MED ORDER — SODIUM CHLORIDE 0.9 % IV BOLUS
1000.0000 mL | Freq: Once | INTRAVENOUS | Status: AC
  Administered 2021-06-28: 1000 mL via INTRAVENOUS

## 2021-06-28 MED ORDER — ACETAMINOPHEN 500 MG PO TABS
1000.0000 mg | ORAL_TABLET | Freq: Once | ORAL | Status: AC
  Administered 2021-06-28: 1000 mg via ORAL
  Filled 2021-06-28: qty 2

## 2021-06-28 MED ORDER — SODIUM CHLORIDE 0.9 % IV SOLN
2.0000 g | Freq: Once | INTRAVENOUS | Status: AC
  Administered 2021-06-28: 2 g via INTRAVENOUS
  Filled 2021-06-28: qty 2

## 2021-06-28 MED ORDER — SODIUM CHLORIDE 0.9 % IV BOLUS
1000.0000 mL | Freq: Once | INTRAVENOUS | Status: AC
Start: 2021-06-28 — End: 2021-06-28
  Administered 2021-06-28: 1000 mL via INTRAVENOUS

## 2021-06-28 MED ORDER — VANCOMYCIN HCL 1.25 G IV SOLR
1250.0000 mg | Freq: Once | INTRAVENOUS | Status: DC
  Filled 2021-06-28 (×3): qty 25

## 2021-06-28 MED ORDER — SODIUM CHLORIDE 0.9 % IV SOLN
2.0000 g | Freq: Three times a day (TID) | INTRAVENOUS | Status: DC
  Administered 2021-06-29 – 2021-07-03 (×13): 2 g via INTRAVENOUS
  Filled 2021-06-28 (×14): qty 2

## 2021-06-28 MED ORDER — VANCOMYCIN HCL 10 G IV SOLR
1750.0000 mg | INTRAVENOUS | Status: DC
  Administered 2021-06-29 – 2021-06-30 (×2): 1750 mg via INTRAVENOUS
  Filled 2021-06-28 (×5): qty 17.5

## 2021-06-28 MED ORDER — VANCOMYCIN HCL 1.25 G IV SOLR
1250.0000 mg | Freq: Once | INTRAVENOUS | Status: DC
  Filled 2021-06-28: qty 25

## 2021-06-28 NOTE — Assessment & Plan Note (Signed)
Allow permissive hypertension 

## 2021-06-28 NOTE — ED Triage Notes (Signed)
Pt reports with fever, cough, abdominal pain, and headache for several days. Pt reports actively undergoing tx for cancer.  ?

## 2021-06-28 NOTE — Assessment & Plan Note (Signed)
Metastatic currently undergoing chemotherapy we will email oncology patient has been admitted ?

## 2021-06-28 NOTE — ED Provider Triage Note (Signed)
Emergency Medicine Provider Triage Evaluation Note ? ?Crystal Gibbs , a 46 y.o. female  was evaluated in triage.  Pt complains of shortness of breath, headache, chest pain, and abdominal pain.  Patient denies nausea, vomiting, diarrhea. Patient recently returned from [place]. Patient is a liver cancer patient ? ?Review of Systems  ?Positive: Shortness of breath, chest pain, abdominal pain, headache ?Negative: Nausea, vomiting ? ?Physical Exam  ?BP 102/77 (BP Location: Left Arm)   Pulse 94   Temp (!) 101.5 ?F (38.6 ?C) (Oral)   Resp 18   Ht '5\' 6"'$  (1.676 m)   Wt 66.7 kg   SpO2 97%   BMI 23.73 kg/m?  ?Gen:   Awake, no distress   ?Resp:  Normal effort  ?MSK:   Moves extremities without difficulty  ?Other:   ? ?Medical Decision Making  ?Medically screening exam initiated at 7:33 PM.  Appropriate orders placed.  Crystal Gibbs was informed that the remainder of the evaluation will be completed by another provider, this initial triage assessment does not replace that evaluation, and the importance of remaining in the ED until their evaluation is complete. ? ? ?  ?Dorothyann Peng, PA-C ?06/28/21 1934 ? ?

## 2021-06-28 NOTE — Progress Notes (Signed)
A consult was received from an ED physician for cefepime per pharmacy dosing.  The patient's profile has been reviewed for ht/wt/allergies/indication/available labs.   ? ?A one time order has been placed for cefepime 2gm IV x1.  Further antibiotics/pharmacy consults should be ordered by admitting physician if indicated.       ?                ?Thank you, ?Dia Sitter P ?06/28/2021  9:15 PM ? ?

## 2021-06-28 NOTE — ED Provider Notes (Signed)
?Crystal Gibbs ?Provider Note ? ? ?CSN: [id number] ?Arrival date & time: 06/28/21  1839 ? ?  ? ?History ? ?Chief Complaint  ?Patient presents with  ? Headache  ? Fever  ? Cough  ? Abdominal Pain  ? ? ?Crystal Gibbs is a 46 y.o. female. ? ?46 yo F with a chief complaints of chest pain fever cough congestion going on for the past 4 to 5 days.  Was seen in the emergency department for the same.  At that time had a work-up that did not show any obvious cause of her symptoms.  Since then felt like the pain is gotten worse has had persistently high fever. ? ? ?Headache ?Associated symptoms: abdominal pain, cough and fever   ?Fever ?Associated symptoms: cough and headaches   ?Cough ?Associated symptoms: fever and headaches   ?Abdominal Pain ?Associated symptoms: cough and fever   ? ?  ? ?Home Medications ?Prior to Admission medications   ?Medication Sig Start Date End Date Taking? Authorizing Provider  ?acetaminophen (TYLENOL) 650 MG CR tablet Take 650 mg by mouth every 8 (eight) hours as needed for pain or fever.   Yes [provider]  ?amLODipine (NORVASC) 2.5 MG tablet Take 1 tablet (2.5 mg total) by mouth daily. 02/24/21  Yes [name], MD  ?azithromycin (ZITHROMAX) 250 MG tablet Take 1 tablet (250 mg total) by mouth daily. Take first 2 tablets together, then 1 every day until finished. 06/25/21  Yes Marcello Fennel, PA-C  ?dexamethasone (DECADRON) 4 MG tablet Take 2 tablets at breakfast for 3 days, starting the day after cisplatin 08/16/20  Yes Alla Feeling, NP  ?diphenoxylate-atropine (LOMOTIL) 2.5-0.025 MG tablet Take 1-2 tablets by mouth 4 (four) times daily as needed for diarrhea or loose stools. 06/02/21  Yes [name], MD  ?lidocaine-prilocaine (EMLA) cream Apply 1 application topically as needed. ?Patient taking differently: Apply 1 application. topically as needed (for port access). 05/26/21  Yes [name], MD  ?pantoprazole (PROTONIX) 40 MG tablet Take 1 tablet (40 mg  total) by mouth daily. 12/16/20  Yes [name], MD  ?traMADol (ULTRAM) 50 MG tablet Take 1 tablet (50 mg total) by mouth every 8 (eight) hours as needed for moderate pain. 04/21/21  Yes Alla Feeling, NP  ?b complex vitamins capsule Take 1 capsule by mouth daily. ?Patient not taking: Reported on 06/25/2021 04/21/21   Alla Feeling, NP  ?diclofenac Sodium (VOLTAREN) 1 % GEL Apply 4 g topically 4 (four) times daily. ?Patient not taking: Reported on 06/25/2021 09/08/20   Simmons-Robinson, Riki Sheer, MD  ?DM-Doxylamine-Acetaminophen (NYQUIL COLD & FLU PO) Take 15 mLs by mouth 2 (two) times daily as needed (for pain and fever). ?Patient not taking: Reported on 06/28/2021    [provider]  ?gabapentin (NEURONTIN) 100 MG capsule Take 1 capsule (100 mg total) by mouth 2 (two) times daily. ?Patient not taking: Reported on 06/28/2021 05/12/21   [name], MD  ?magnesium oxide (MAG-OX) 400 (240 Mg) MG tablet Take 1 tablet (400 mg total) by mouth daily. ?Patient not taking: Reported on 06/25/2021 12/16/20   [name], MD  ?megestrol (MEGACE ES) 625 MG/5ML suspension Take 5 mLs (625 mg total) by mouth daily. ?Patient not taking: Reported on 06/25/2021 05/26/21   [name], MD  ?ondansetron (ZOFRAN) 4 MG tablet Take 1 tablet (4 mg total) by mouth every 6 (six) hours. ?Patient not taking: Reported on 06/28/2021 06/25/21   Marcello Fennel, PA-C  ?oxyCODONE (ROXICODONE) 5  MG immediate release tablet Take 1 tablet (5 mg total) by mouth every 8 (eight) hours as needed for up to 3 days for severe pain. ?Patient not taking: Reported on 06/28/2021 06/25/21 06/28/21  Marcello Fennel, PA-C  ?polyethylene glycol powder (GLYCOLAX/MIRALAX) 17 GM/SCOOP powder Take 17 g by mouth 2 (two) times daily as needed. ?Patient not taking: Reported on 06/25/2021 11/01/20   Simmons-Robinson, Riki Sheer, MD  ?PRESCRIPTION MEDICATION Take 400 mg by mouth 2 (two) times daily. Cefim '400mg'$  capsules ([place]) ?SN: [id number] ?Lot: [id number]    [provider]  ?prochlorperazine (COMPAZINE) 10 MG tablet Take 1 tablet (10 mg total) by mouth every 6 (six) hours as needed for nausea or vomiting. ?Patient not taking: Reported on 06/25/2021 06/02/21   [name], MD  ?   ? ?Allergies    ?Patient has no known allergies.   ? ?Review of Systems   ?Review of Systems  ?Constitutional:  Positive for fever.  ?Respiratory:  Positive for cough.   ?Gastrointestinal:  Positive for abdominal pain.  ?Neurological:  Positive for headaches.  ? ?Physical Exam ?Updated Vital Signs ?BP 109/62   Pulse (!) 127   Temp (!) 101.5 ?F (38.6 ?C) (Oral)   Resp (!) 30   Ht '5\' 6"'$  (1.676 m)   Wt 66.7 kg   SpO2 98%   BMI 23.73 kg/m?  ?Physical Exam ?Vitals and nursing note reviewed.  ?Constitutional:   ?   General: She is not in acute distress. ?   Appearance: She is well-developed. She is not diaphoretic.  ?HENT:  ?   Head: Normocephalic and atraumatic.  ?Eyes:  ?   Pupils: Pupils are equal, round, and reactive to light.  ?Cardiovascular:  ?   Rate and Rhythm: Regular rhythm. Tachycardia present.  ?   Heart sounds: No murmur heard. ?  No friction rub. No gallop.  ?Pulmonary:  ?   Effort: Pulmonary effort is normal.  ?   Breath sounds: No wheezing or rales.  ?Abdominal:  ?   General: There is no distension.  ?   Palpations: Abdomen is soft.  ?   Tenderness: There is abdominal tenderness.  ?   Comments: Mostly epigastric pain ?  ?Musculoskeletal:     ?   General: No tenderness.  ?   Cervical back: Normal range of motion and neck supple.  ?Skin: ?   General: Skin is warm and dry.  ?Neurological:  ?   Mental Status: She is alert and oriented to person, place, and time.  ?Psychiatric:     ?   Behavior: Behavior normal.  ? ? ?ED Results / Procedures / Treatments   ?Labs ?(all labs ordered are listed, but only abnormal results are displayed) ?Labs Reviewed  ?BASIC METABOLIC PANEL - Abnormal; Notable for the following components:  ?    Result Value  ? Sodium 132 (*)   ? CO2 20 (*)   ? Calcium  8.4 (*)   ? All other components within normal limits  ?CBC WITH DIFFERENTIAL/PLATELET - Abnormal; Notable for the following components:  ? WBC 11.4 (*)   ? RBC 3.10 (*)   ? Hemoglobin 8.7 (*)   ? HCT 27.7 (*)   ? RDW 20.2 (*)   ? Neutro Abs 8.8 (*)   ? Monocytes Absolute 1.2 (*)   ? Abs Immature Granulocytes 0.16 (*)   ? All other components within normal limits  ?LACTIC ACID, PLASMA - Abnormal; Notable for the following components:  ? Lactic  Acid, Venous 2.5 (*)   ? All other components within normal limits  ?PROTIME-INR - Abnormal; Notable for the following components:  ? Prothrombin Time 16.0 (*)   ? INR 1.3 (*)   ? All other components within normal limits  ?I-STAT CHEM 8, ED - Abnormal; Notable for the following components:  ? BUN 5 (*)   ? Glucose, Bld 100 (*)   ? Calcium, Ion 1.11 (*)   ? Hemoglobin 9.5 (*)   ? HCT 28.0 (*)   ? All other components within normal limits  ?TROPONIN I (HIGH SENSITIVITY) - Abnormal; Notable for the following components:  ? Troponin I (High Sensitivity) 18 (*)   ? All other components within normal limits  ?RESP PANEL BY RT-PCR (FLU A&B, COVID) ARPGX2  ?CULTURE, BLOOD (ROUTINE X 2)  ?CULTURE, BLOOD (ROUTINE X 2)  ?EXPECTORATED SPUTUM ASSESSMENT W GRAM STAIN, RFLX TO RESP C  ?RESPIRATORY PANEL BY PCR  ?MRSA NEXT GEN BY PCR, NASAL  ?BRAIN NATRIURETIC PEPTIDE  ?LIPASE, BLOOD  ?LACTIC ACID, PLASMA  ?URINALYSIS, ROUTINE W REFLEX MICROSCOPIC  ?HEPATIC FUNCTION PANEL  ?CK  ?MAGNESIUM  ?PHOSPHORUS  ?TSH  ?CREATININE, URINE, RANDOM  ?OSMOLALITY, URINE  ?SODIUM, URINE, RANDOM  ?PROCALCITONIN  ?PROCALCITONIN  ?LACTIC ACID, PLASMA  ?LACTIC ACID, PLASMA  ?VITAMIN B12  ?FOLATE  ?IRON AND TIBC  ?FERRITIN  ?RETICULOCYTES  ?TROPONIN I (HIGH SENSITIVITY)  ? ? ?EKG ?EKG Interpretation ? ?Date/Time:  Tuesday June 28 2021 19:57:52 EDT ?Ventricular Rate:  148 ?PR Interval:  111 ?QRS Duration: 86 ?QT Interval:  282 ?QTC Calculation: 443 ?R Axis:   -58 ?Text Interpretation: Sinus tachycardia Left  anterior fascicular block Low voltage, precordial leads Consider anterior infarct No significant change since last tracing Confirmed by Deno Etienne [phone]) on 06/28/2021 8:00:03 PM ? ?Radiology ?DG Chest 2 View ? ?Result Date

## 2021-06-28 NOTE — ED Notes (Signed)
Pt unable to provide urine sample at this time 

## 2021-06-28 NOTE — Assessment & Plan Note (Signed)
- -  Patient presenting with  productive cough, and infiltrat chest x-ray ?-Infiltrate on CXR and 2-3 characteristics (fever, leukocytosis, purulent sputum) are consistent with pneumonia. ?-This appears to be most likely community-acquired pneumonia.  ?  ? will admit for treatment of CAP will start on appropriate antibiotic coverage. -Given recent exposure to chemotherapy and severity of symptoms will treat with cefepime Vanco ?  Obtain:  sputum cultures, ?                 Obtain respiratory panel  ?                influenza serologies negative ?                 COVID PCR negative    ?                 blood cultures and sputum cultures ordered  ?                 strep pneumo UA antigen,  ?                 check for Legionella antigen. ?               Provide oxygen as needed.  ? ? ?

## 2021-06-28 NOTE — Assessment & Plan Note (Signed)
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

## 2021-06-28 NOTE — Subjective & Objective (Signed)
Comes with fever, cough adb pian and headache for the past week ?Was seen 3 days ago in [facility]  work up done and sent home ?But feels worse now ?

## 2021-06-28 NOTE — Progress Notes (Signed)
Pharmacy Antibiotic Note ? ?Crystal Gibbs is a 46 y.o. female admitted on 06/28/2021 with  complaints of chest pain fever cough congestion going on for the past 4 to 5 days.  Pharmacy has been consulted to dose vancomycin and cefepime for pna. ? ?Plan: ?Vancomycin '1750mg'$  IV q24h (AUC 491.6, Scr used 0.8) ?Cefepime 2gm  IV q8h ?Follow renal function, cultures and clinical course ? ?Height: '5\' 6"'$  (167.6 cm) ?Weight: 66.7 kg (147 lb) ?IBW/kg (Calculated) : 59.3 ? ?Temp (24hrs), Avg:101.5 ?F (38.6 ?C), Min:101.5 ?F (38.6 ?C), Max:101.5 ?F (38.6 ?C) ? ?Recent Labs  ?Lab 06/24/21 ?2345 06/28/21 ?2009 06/28/21 ?2022  ?WBC 7.3 11.4*  --   ?CREATININE 0.72 0.78 0.70  ?LATICACIDVEN  --  2.5*  --   ?  ?Estimated Creatinine Clearance: 84 mL/min (by C-G formula based on SCr of 0.7 mg/dL).   ? ?No Known Allergies ? ?Antimicrobials this admission: ?3/21 vanc >> ?3/21 cefepime >> ? ?Dose adjustments this admission: ? ? ?Microbiology results: ?3/21 BCx:  ?3/21 Sputum:   ? ? ?Thank you for allowing pharmacy to be a part of this patient?s care. ? ?Dolly Rias RPh ?06/28/2021, 10:11 PM ? ? ?

## 2021-06-28 NOTE — Assessment & Plan Note (Signed)
-  SIRS criteria met with  elevated white blood cell count,    ?   ?Component Value Date/Time  ? WBC 11.4 (H) 06/28/2021 2009  ? LYMPHSABS 1.3 06/28/2021 2009  ? ? ? tachycardia   , RR >20 ?Today's Vitals  ? 06/28/21 2045 06/28/21 2115 06/28/21 2130 06/28/21 2145  ?BP: 108/74 (!) 84/74 110/74 105/63  ?Pulse: (!) 134 (!) 137 (!) 144 (!) 138  ?Resp: (!) $RemoveBe'31 19 20 'avVaiayex$ (!) 30  ?Temp:      ?TempSrc:      ?SpO2: 99% 99% 99% 99%  ?Weight:      ?Height:      ?PainSc:      ? ?  ?  ?The recent clinical data is shown below. ?Vitals:  ? 06/28/21 2045 06/28/21 2115 06/28/21 2130 06/28/21 2145  ?BP: 108/74 (!) 84/74 110/74 105/63  ?Pulse: (!) 134 (!) 137 (!) 144 (!) 138  ?Resp: (!) $RemoveBe'31 19 20 'DqVjIYbDN$ (!) 30  ?Temp:      ?TempSrc:      ?SpO2: 99% 99% 99% 99%  ?Weight:      ?Height:      ? ?   ? ?-Most likely source being: , pulmonary,  ?  ? ? Patient meeting criteria for Severe sepsis with  ?  evidence of end organ damage/organ dysfunction such as  ?  elevated lactic acid >2  ?   ?Component Value Date/Time  ? LATICACIDVEN 2.5 ([place]) 06/28/2021 2009  ? ?  ? SBP<90 mmhg or MAP < 65 mmhg,  ?  ? Patient is meeting criteria for SEPTIC SHOCK with  ?lactic acid > 4 or multiple SBP readings below 90 mmhg or MAP reading below 65 mmhg ? ? - Obtain serial lactic acid and procalcitonin level. ? - Initiated IV antibiotics in ER: ?Antibiotics Given (last 72 hours)   ? Date/Time Action Medication Dose Rate  ? 06/28/21 2145 New Bag/Given  ?[gravity]  ? ceFEPIme (MAXIPIME) 2 g in sodium chloride 0.9 % 100 mL IVPB 2 g 200 mL/hr  ?  ? ? ?Will continue  on : Cefepime and vancomycine ? ? - await results of blood and urine culture ? - Rehydrate aggressively  ?Intravenous fluids were administered  ? ?   30cc/kg fluid ?  ?10:09 PM ? ?

## 2021-06-28 NOTE — H&P (Signed)
? ? ? ?Crystal Gibbs [id number] DOB: 1975/12/12 DOA: 06/28/2021 ? ? ?  ?PCP: [name], MD   ?Outpatient Specialists:  ?  ? Oncology   [name] ?  ? ?Patient arrived to ER on 06/28/21 at [place] ?Referred by Attending Deno Etienne, DO ? ? ?Patient coming from:   ? home Lives With family ?  ? ?Chief Complaint:   ?Chief Complaint  ?Patient presents with  ? Headache  ? Fever  ? Cough  ? Abdominal Pain  ? ? ?HPI: ?Crystal Gibbs is a 46 y.o. 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  ? ?Presented with   cough shortness of breath ?Comes with fever, cough adb pian and headache for the past week ?Was seen 3 days ago in Er HAD  work up done and sent home ?But feels worse now ?Patient denies any diarrhea no chest pain no leg swelling no abdominal discomfort no sick contacts ? ? Initial COVID TEST  ?NEGATIVE  ? ?Lab Results  ?Component Value Date  ? [place] NEGATIVE 06/24/2021  ? [place] NEGATIVE 08/17/2020  ? [place] Not Detected 03/11/2019  ? ?  ?Regarding pertinent Chronic problems:   ? ? Hyperlipidemia -not on statins ?Lipid Panel  ?   ?Component Value Date/Time  ? CHOL 204 (H) 09/02/2015 1136  ? TRIG 136 09/02/2015 1136  ? HDL 37 (L) 09/02/2015 1136  ? CHOLHDL 5.5 (H) 09/02/2015 1136  ? VLDL 27 09/02/2015 1136  ? LDLCALC 140 (H) 09/02/2015 1136  ?Regarding history of cholangiocarcinoma.  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 ? HTN on Norvasc ?     Chronic anemia - baseline hg Hemoglobin & Hematocrit  ?Recent Labs  ?  06/24/21 ?2345 06/28/21 ?2009 06/28/21 ?2022  ?HGB 8.7* 8.7* 9.5*  ?  ? ?While in ER: ?  ? ?Now chest x-ray was concerning for pneumonia patient  admitted for pneumonia meeting sepsis criteria ? ? ?Ordered ?  ? ?CXR - New right basilar atelectasis and airspace disease. ?2. Increased in central interstitial markings may related to ?infectious/inflammation or central pulmonary vascular congestion ?  ?On 18th patient undergone CT angio as well as CT abdomen and pelvis showed numerous bilateral pulmonary nodules compatible with metastatic disease no PE but there is a large hepatic mass with multifocal hepatic metastases which is progressed from prior ? ?Following Medications were ordered in ER: ?Medications  ?ceFEPIme (MAXIPIME) 2 g in sodium chloride 0.9 % 100 mL IVPB (has no administration in time range)  ?vancomycin (VANCOREADY) IVPB 1250 mg/250 mL (has no administration in time range)  ?sodium chloride 0.9 % bolus 1,000 mL (has no administration in time range)  ?sodium chloride 0.9 % bolus 1,000 mL (0 mLs Intravenous Stopped 06/28/21 2117)  ?acetaminophen (TYLENOL) tablet 1,000 mg (1,000 mg Oral Given 06/28/21 2026)  ?   ?  ?ED Triage Vitals  ?Enc Vitals Group  ?   BP 06/28/21 1925 102/77  ?   Pulse Rate 06/28/21 1925 (!) 160  ?   Resp 06/28/21 1925 18  ?   Temp 06/28/21 1925 (!) 101.5 ?F (38.6 ?C)  ?   Temp Source 06/28/21 1925 Oral  ?   SpO2 06/28/21 1925 97 %  ?   Weight 06/28/21  1925 147 lb (66.7 kg)  ?   Height 06/28/21 1925 '5\' 6"'$  (1.676 m)  ?   Head Circumference --   ?   Peak Flow --   ?   Pain Score 06/28/21 1944 10  ?   Pain Loc --   ?   Pain Edu? --   ?   Excl. in [place]? --   ?ESPQ(33)@    ? _________________________________________ ?Significant initial  Findings: ?Abnormal Labs Reviewed  ?BASIC METABOLIC PANEL - Abnormal; Notable for the following components:  ?    Result Value  ? Sodium 132 (*)   ? CO2 20 (*)   ? Calcium 8.4 (*)   ? All other components within normal limits  ?CBC WITH DIFFERENTIAL/PLATELET - Abnormal; Notable for the following components:  ? WBC 11.4 (*)   ? RBC 3.10 (*)   ? Hemoglobin 8.7 (*)   ? HCT 27.7 (*)   ? RDW 20.2 (*)   ?  Neutro Abs 8.8 (*)   ? Monocytes Absolute 1.2 (*)   ? Abs Immature Granulocytes 0.16 (*)   ? All other components within normal limits  ?PROTIME-INR - Abnormal; Notable for the following components:  ? Prothrombin Time 16.0 (*)   ? INR 1.3 (*)   ? All other components within normal limits  ?I-STAT CHEM 8, ED - Abnormal; Notable for the following components:  ? BUN 5 (*)   ? Glucose, Bld 100 (*)   ? Calcium, Ion 1.11 (*)   ? Hemoglobin 9.5 (*)   ? HCT 28.0 (*)   ? All other components within normal limits  ?TROPONIN I (HIGH SENSITIVITY) - Abnormal; Notable for the following components:  ? Troponin I (High Sensitivity) 18 (*)   ? All other components within normal limits  ? ?  ?  ?ECG: Ordered ?Personally reviewed by me showing: ?HR : 148 ?Rhythm: Sinus tachycardia  ?  no evidence of ischemic changes ?QTC 443 ? ? ?____________________ ?This patient meets SIRS Criteria and may be septic.  ? ?The recent clinical data is shown below. ?Vitals:  ? 06/28/21 2000 06/28/21 2015 06/28/21 2045 06/28/21 2115  ?BP: 108/69 104/67 108/74 (!) 84/74  ?Pulse: (!) 146 (!) 149 (!) 134 (!) 137  ?Resp: (!) 32 (!) 30 (!) 31 19  ?Temp:      ?TempSrc:      ?SpO2: 100% 98% 99% 99%  ?Weight:      ?Height:      ?  ?WBC ? ?   ?Component Value Date/Time  ? WBC 11.4 (H) 06/28/2021 2009  ? LYMPHSABS 1.3 06/28/2021 2009  ? MONOABS 1.2 (H) 06/28/2021 2009  ? EOSABS 0.0 06/28/2021 2009  ? BASOSABS 0.0 06/28/2021 2009  ? ?  ? ?Lactic Acid, Venous ?   ?Component Value Date/Time  ? LATICACIDVEN 2.5 ([place]) 06/28/2021 2009  ? ? ? ?Procalcitonin  3.24 ?Lactic Acid, Venous ?   ?Component Value Date/Time  ? LATICACIDVEN 1.6 06/28/2021 2208  ? ?  ? UA   no evidence of UTI    ?  ?Urine analysis: ?   ?Component Value Date/Time  ? [place] 06/24/2021 2317  ? APPEARANCEUR CLEAR 06/24/2021 2317  ? LABSPEC 1.010 06/24/2021 2317  ? PHURINE 6.0 06/24/2021 2317  ? GLUCOSEU NEGATIVE 06/24/2021 2317  ? [place] NEGATIVE 06/24/2021 2317  ? BILIRUBINUR SMALL (A)  06/24/2021 2317  ? BILIRUBINUR negative 11/01/2020 0000  ? BILIRUBINUR NEG 09/02/2015 1125  ? [place] NEGATIVE 06/24/2021 2317  ? PROTEINUR  TRACE (A) 06/24/2021 2317  ? UROBILINOGEN 0.2 11/01/2020 0000  ? UROBILINOGEN 0.2 09/30/2014 1235  ? NITRITE NEGATIVE 06/24/2021 2317  ? LEUKOCYTESUR NEGATIVE 06/24/2021 2317  ? ? ?Results for orders placed or performed during the hospital encounter of 06/24/21  ?Resp Panel by RT-PCR (Flu A&B, Covid) Nasopharyngeal Swab     Status: None  ? Collection Time: 06/24/21 11:45 PM  ? Specimen: Nasopharyngeal Swab; Nasopharyngeal(NP) swabs in vial transport medium  ?Result Value Ref Range Status  ? SARS Coronavirus 2 by RT PCR NEGATIVE NEGATIVE Final  ?      ? Influenza A by PCR NEGATIVE NEGATIVE Final  ? Influenza B by PCR NEGATIVE NEGATIVE Final  ?      ? ?  ?_______________________________________________ ?Hospitalist was called for admission for community-acquired pneumonia and sepsis in the setting of history of cancer ? ?The following Work up has been ordered so far: ? ?Orders Placed This Encounter  ?Procedures  ? Blood culture (routine x 2)  ? Resp Panel by RT-PCR (Flu A&B, Covid) Nasopharyngeal Swab  ? DG Chest 2 View  ? Brain natriuretic peptide  ? Basic metabolic panel  ? CBC with Differential  ? Lipase, blood  ? Lactic acid, plasma  ? Protime-INR  ? Urinalysis, Routine w reflex microscopic  ? Hepatic function panel  ? Cardiac monitoring  ? Notify physician (specify)  ? Document height and weight  ? Consult to hospitalist  ? Pulse oximetry (single)  ? I-Stat Chem 8, ED  ? ED EKG  ? EKG 12-Lead  ? EKG 12-Lead  ? Insert peripheral IV  ?  ? ?OTHER Significant initial  Findings: ? ?labs showing: ? ?  ?Recent Labs  ?Lab 06/24/21 ?2345 06/28/21 ?2009 06/28/21 ?2022 06/28/21 ?2213  ?NA 129* 132* 135  --   ?K 4.4 4.3 4.5  --   ?CO2 19* 20*  --   --   ?GLUCOSE 86 98 100*  --   ?BUN 13 8 5*  --   ?CREATININE 0.72 0.78 0.70  --   ?CALCIUM 8.2* 8.4*  --   --   ?MG  --   --   --  1.4*   ?PHOS  --   --   --  3.2  ? ? ?Cr   stable,    ?Lab Results  ?Component Value Date  ? CREATININE 0.70 06/28/2021  ? CREATININE 0.78 06/28/2021  ? CREATININE 0.72 06/24/2021  ? ? ?Recent Labs  ?Lab 06/24/21 ?[place]

## 2021-06-29 DIAGNOSIS — R000 Tachycardia, unspecified: Secondary | ICD-10-CM

## 2021-06-29 DIAGNOSIS — D638 Anemia in other chronic diseases classified elsewhere: Secondary | ICD-10-CM

## 2021-06-29 DIAGNOSIS — J189 Pneumonia, unspecified organism: Secondary | ICD-10-CM | POA: Diagnosis not present

## 2021-06-29 LAB — HEMOGLOBIN AND HEMATOCRIT, BLOOD
HCT: 20.6 % — ABNORMAL LOW (ref 36.0–46.0)
HCT: 26.9 % — ABNORMAL LOW (ref 36.0–46.0)
Hemoglobin: 6.5 g/dL — CL (ref 12.0–15.0)
Hemoglobin: 8.7 g/dL — ABNORMAL LOW (ref 12.0–15.0)

## 2021-06-29 LAB — RESPIRATORY PANEL BY PCR

## 2021-06-29 LAB — PHOSPHORUS: Phosphorus: 3.5 mg/dL (ref 2.5–4.6)

## 2021-06-29 LAB — URINALYSIS, ROUTINE W REFLEX MICROSCOPIC
Bilirubin Urine: NEGATIVE
Glucose, UA: NEGATIVE mg/dL
Hgb urine dipstick: NEGATIVE
Ketones, ur: NEGATIVE mg/dL
Nitrite: NEGATIVE
Protein, ur: NEGATIVE mg/dL
Specific Gravity, Urine: <1.005 — ABNORMAL LOW (ref 1.005–1.030)
pH: 6 (ref 5.0–8.0)

## 2021-06-29 LAB — RETICULOCYTES
Immature Retic Fract: 27.3 % — ABNORMAL HIGH (ref 2.3–15.9)
RBC.: 4.8 MIL/uL (ref 3.87–5.11)
Retic Count, Absolute: 162.2 10*3/uL (ref 19.0–186.0)
Retic Ct Pct: 3.4 % — ABNORMAL HIGH (ref 0.4–3.1)

## 2021-06-29 LAB — CBC WITH DIFFERENTIAL/PLATELET
Abs Immature Granulocytes: 0.1 10*3/uL — ABNORMAL HIGH (ref 0.00–0.07)
Basophils Absolute: 0 10*3/uL (ref 0.0–0.1)
Basophils Relative: 0 %
Eosinophils Absolute: 0 10*3/uL (ref 0.0–0.5)
Eosinophils Relative: 0 %
HCT: 19.5 % — ABNORMAL LOW (ref 36.0–46.0)
Hemoglobin: 6.1 g/dL — CL (ref 12.0–15.0)
Immature Granulocytes: 1 %
Lymphocytes Relative: 12 %
Lymphs Abs: 1.1 10*3/uL (ref 0.7–4.0)
MCH: 27.9 pg (ref 26.0–34.0)
MCHC: 31.3 g/dL (ref 30.0–36.0)
MCV: 89 fL (ref 80.0–100.0)
Monocytes Absolute: 1 10*3/uL (ref 0.1–1.0)
Monocytes Relative: 11 %
Neutro Abs: 6.9 10*3/uL (ref 1.7–7.7)
Neutrophils Relative %: 76 %
Platelets: 147 10*3/uL — ABNORMAL LOW (ref 150–400)
RBC: 2.19 MIL/uL — ABNORMAL LOW (ref 3.87–5.11)
RDW: 20.1 % — ABNORMAL HIGH (ref 11.5–15.5)
WBC: 9.1 10*3/uL (ref 4.0–10.5)
nRBC: 0 % (ref 0.0–0.2)

## 2021-06-29 LAB — LACTIC ACID, PLASMA: Lactic Acid, Venous: 1.3 mmol/L (ref 0.5–1.9)

## 2021-06-29 LAB — FOLATE: Folate: 24.1 ng/mL (ref >5.9)

## 2021-06-29 LAB — COMPREHENSIVE METABOLIC PANEL
ALT: 39 U/L (ref 0–44)
AST: 92 U/L — ABNORMAL HIGH (ref 15–41)
Albumin: 2 g/dL — ABNORMAL LOW (ref 3.5–5.0)
Alkaline Phosphatase: 273 U/L — ABNORMAL HIGH (ref 38–126)
Anion gap: 6 (ref 5–15)
BUN: 8 mg/dL (ref 6–20)
CO2: 20 mmol/L — ABNORMAL LOW (ref 22–32)
Calcium: 7.3 mg/dL — ABNORMAL LOW (ref 8.9–10.3)
Chloride: 108 mmol/L (ref 98–111)
Creatinine, Ser: 0.62 mg/dL (ref 0.44–1.00)
GFR, Estimated: >60 mL/min (ref >60)
Glucose, Bld: 98 mg/dL (ref 70–99)
Potassium: 4.2 mmol/L (ref 3.5–5.1)
Sodium: 134 mmol/L — ABNORMAL LOW (ref 135–145)
Total Bilirubin: 1.4 mg/dL — ABNORMAL HIGH (ref 0.3–1.2)
Total Protein: 6.1 g/dL — ABNORMAL LOW (ref 6.5–8.1)

## 2021-06-29 LAB — HIV ANTIBODY (ROUTINE TESTING W REFLEX): HIV Screen 4th Generation wRfx: NONREACTIVE

## 2021-06-29 LAB — URINALYSIS, MICROSCOPIC (REFLEX)

## 2021-06-29 LAB — OSMOLALITY, URINE: Osmolality, Ur: 180 mOsm/kg — ABNORMAL LOW (ref 300–900)

## 2021-06-29 LAB — MAGNESIUM: Magnesium: 1.7 mg/dL (ref 1.7–2.4)

## 2021-06-29 LAB — FERRITIN: Ferritin: 873 ng/mL — ABNORMAL HIGH (ref 11–307)

## 2021-06-29 LAB — IRON AND TIBC
Iron: 14 ug/dL — ABNORMAL LOW (ref 28–170)
Saturation Ratios: 12 % (ref 10.4–31.8)
TIBC: 119 ug/dL — ABNORMAL LOW (ref 250–450)
UIBC: 105 ug/dL

## 2021-06-29 LAB — SODIUM, URINE, RANDOM: Sodium, Ur: 23 mmol/L

## 2021-06-29 LAB — PREPARE RBC (CROSSMATCH)

## 2021-06-29 LAB — STREP PNEUMONIAE URINARY ANTIGEN: Strep Pneumo Urinary Antigen: NEGATIVE

## 2021-06-29 LAB — PROCALCITONIN: Procalcitonin: 3.23 ng/mL

## 2021-06-29 LAB — CREATININE, URINE, RANDOM: Creatinine, Urine: 53.88 mg/dL

## 2021-06-29 LAB — VITAMIN B12: Vitamin B-12: 2408 pg/mL — ABNORMAL HIGH (ref 180–914)

## 2021-06-29 LAB — MRSA NEXT GEN BY PCR, NASAL: MRSA by PCR Next Gen: NOT DETECTED

## 2021-06-29 MED ORDER — KETOROLAC TROMETHAMINE 15 MG/ML IJ SOLN
15.0000 mg | Freq: Once | INTRAMUSCULAR | Status: AC
  Administered 2021-06-29: 15 mg via INTRAVENOUS
  Filled 2021-06-29: qty 1

## 2021-06-29 MED ORDER — SODIUM CHLORIDE 0.9% FLUSH
10.0000 mL | INTRAVENOUS | Status: DC | PRN
  Administered 2021-07-03: 10 mL

## 2021-06-29 MED ORDER — SODIUM CHLORIDE 0.9 % IV SOLN
75.0000 mL/h | INTRAVENOUS | Status: AC
  Administered 2021-06-29: 75 mL/h via INTRAVENOUS

## 2021-06-29 MED ORDER — SODIUM CHLORIDE 0.9% FLUSH
10.0000 mL | Freq: Two times a day (BID) | INTRAVENOUS | Status: DC
  Administered 2021-06-29: 30 mL
  Administered 2021-06-30 (×2): 10 mL
  Administered 2021-07-01: 30 mL
  Administered 2021-07-01: 20 mL
  Administered 2021-07-02 – 2021-07-03 (×2): 10 mL

## 2021-06-29 MED ORDER — HYDROCODONE BIT-HOMATROP MBR 5-1.5 MG/5ML PO SOLN
5.0000 mL | ORAL | Status: DC | PRN
  Administered 2021-06-29 – 2021-07-01 (×5): 5 mL via ORAL
  Filled 2021-06-29 (×5): qty 5

## 2021-06-29 MED ORDER — SODIUM CHLORIDE 0.9% IV SOLUTION: Freq: Once | INTRAVENOUS | Status: AC

## 2021-06-29 MED ORDER — ORAL CARE MOUTH RINSE
15.0000 mL | Freq: Two times a day (BID) | OROMUCOSAL | Status: DC
  Administered 2021-06-29 – 2021-07-03 (×9): 15 mL via OROMUCOSAL

## 2021-06-29 MED ORDER — ACETAMINOPHEN 325 MG PO TABS
650.0000 mg | ORAL_TABLET | Freq: Four times a day (QID) | ORAL | Status: DC | PRN
  Administered 2021-06-29 – 2021-07-01 (×6): 650 mg via ORAL
  Filled 2021-06-29 (×6): qty 2

## 2021-06-29 MED ORDER — ALBUTEROL SULFATE (2.5 MG/3ML) 0.083% IN NEBU
2.5000 mg | INHALATION_SOLUTION | RESPIRATORY_TRACT | Status: DC | PRN
  Administered 2021-06-29: 2.5 mg via RESPIRATORY_TRACT
  Filled 2021-06-29: qty 3

## 2021-06-29 MED ORDER — MAGNESIUM SULFATE 2 GM/50ML IV SOLN
2.0000 g | Freq: Once | INTRAVENOUS | Status: AC
  Administered 2021-06-29: 2 g via INTRAVENOUS
  Filled 2021-06-29: qty 50

## 2021-06-29 MED ORDER — ACETAMINOPHEN 650 MG RE SUPP: 650.0000 mg | Freq: Four times a day (QID) | RECTAL | Status: DC | PRN

## 2021-06-29 MED ORDER — HYDROCODONE-ACETAMINOPHEN 5-325 MG PO TABS
1.0000 | ORAL_TABLET | ORAL | Status: DC | PRN
  Administered 2021-06-29: 1 via ORAL
  Administered 2021-07-02 – 2021-07-03 (×3): 2 via ORAL
  Filled 2021-06-29 (×3): qty 2
  Filled 2021-06-29: qty 1

## 2021-06-29 MED ORDER — CHLORHEXIDINE GLUCONATE CLOTH 2 % EX PADS
6.0000 | MEDICATED_PAD | Freq: Every day | CUTANEOUS | Status: DC
  Administered 2021-06-29 – 2021-07-03 (×5): 6 via TOPICAL

## 2021-06-29 MED ORDER — GUAIFENESIN ER 600 MG PO TB12
600.0000 mg | ORAL_TABLET | Freq: Two times a day (BID) | ORAL | Status: DC
  Administered 2021-06-29 – 2021-07-03 (×9): 600 mg via ORAL
  Filled 2021-06-29 (×9): qty 1

## 2021-06-29 MED FILL — Dexamethasone Sodium Phosphate Inj 100 MG/10ML: INTRAMUSCULAR | Qty: 1 | Status: AC

## 2021-06-29 NOTE — Hospital Course (Addendum)
46 y.o. female with medical history significant of intrahepatic cholangiocarcinoma with multiple metastasis followed by oncology Dr  Burr Medico, HLD, HTN, presented with cough/shortness of breath,headache fever, for the past week. She was seen 3 days  PTA in Er HAD  work up done and sent home but comes with feeling worse, no diarrhea or leg swelling. ?In ED chest x-ray- New right basilar atelectasis and airspace disease. Increased in central interstitial markings may related to infectious/inflammation or central pulmonary vascular congestion ?On 3/18: CT angio as well as CT abdomen and pelvis showed numerous bilateral pulmonary nodules compatible with metastatic disease no PE but there is a large hepatic mass with multifocal hepatic metastases which is progressed from prior.  Vitals with tachycardia UPTO 160S, hypotensive  84/74, febrile 101.5, labs showed leukocytosis, lactic acidosis 2.5 hypomagnesemia, metabolic acidosis elevated ALP. She was given IV fluids and broad-spectrum antibiotics and admitted for sepsis/pneumonia.  Managed with broad-spectrum antibiotics, oncology was consulted.  she head recurrent fever and tachycardia.  Culture data so far are unremarkable.  Patient having chest pain dyspnea suspect presentation combination of pneumonia as well as metastatic disease in the lung, given the progressive nature of patient's metastatic disease with large multifocal hepatic metastasis, multifocal pulmonary metastasis and small to moderate ascites hematology-oncology discussed/offered palliative care/hospice but patient was not ready. ?Seen by palliative care because of patient's believe at this time remains full code and patient will continue to follow-up with outpatient palliative care/hematology oncology.  She has now clinically improve ?

## 2021-06-29 NOTE — Plan of Care (Signed)
  Problem: Activity: Goal: Ability to tolerate increased activity will improve Outcome: Progressing   Problem: Clinical Measurements: Goal: Ability to maintain a body temperature in the normal range will improve Outcome: Progressing   Problem: Respiratory: Goal: Ability to maintain adequate ventilation will improve Outcome: Progressing Goal: Ability to maintain a clear airway will improve Outcome: Progressing   Problem: Education: Goal: Knowledge of General Education information will improve Description: Including pain rating scale, medication(s)/side effects and non-pharmacologic comfort measures Outcome: Progressing   Problem: Health Behavior/Discharge Planning: Goal: Ability to manage health-related needs will improve Outcome: Progressing   Problem: Clinical Measurements: Goal: Ability to maintain clinical measurements within normal limits will improve Outcome: Progressing Goal: Will remain free from infection Outcome: Progressing Goal: Diagnostic test results will improve Outcome: Progressing Goal: Respiratory complications will improve Outcome: Progressing Goal: Cardiovascular complication will be avoided Outcome: Progressing   Problem: Activity: Goal: Risk for activity intolerance will decrease Outcome: Progressing   Problem: Nutrition: Goal: Adequate nutrition will be maintained Outcome: Progressing   Problem: Coping: Goal: Level of anxiety will decrease Outcome: Progressing   Problem: Elimination: Goal: Will not experience complications related to bowel motility Outcome: Progressing Goal: Will not experience complications related to urinary retention Outcome: Progressing   Problem: Pain Managment: Goal: General experience of comfort will improve Outcome: Progressing   Problem: Safety: Goal: Ability to remain free from injury will improve Outcome: Progressing   Problem: Skin Integrity: Goal: Risk for impaired skin integrity will decrease Outcome:  Progressing   Problem: Education: Goal: Knowledge of General Education information will improve Description: Including pain rating scale, medication(s)/side effects and non-pharmacologic comfort measures Outcome: Progressing   Problem: Health Behavior/Discharge Planning: Goal: Ability to manage health-related needs will improve Outcome: Progressing   Problem: Clinical Measurements: Goal: Ability to maintain clinical measurements within normal limits will improve Outcome: Progressing Goal: Will remain free from infection Outcome: Progressing Goal: Diagnostic test results will improve Outcome: Progressing Goal: Respiratory complications will improve Outcome: Progressing Goal: Cardiovascular complication will be avoided Outcome: Progressing   Problem: Activity: Goal: Risk for activity intolerance will decrease Outcome: Progressing   Problem: Nutrition: Goal: Adequate nutrition will be maintained Outcome: Progressing   Problem: Coping: Goal: Level of anxiety will decrease Outcome: Progressing   Problem: Elimination: Goal: Will not experience complications related to bowel motility Outcome: Progressing Goal: Will not experience complications related to urinary retention Outcome: Progressing   Problem: Pain Managment: Goal: General experience of comfort will improve Outcome: Progressing   Problem: Safety: Goal: Ability to remain free from injury will improve Outcome: Progressing   Problem: Skin Integrity: Goal: Risk for impaired skin integrity will decrease Outcome: Progressing   

## 2021-06-29 NOTE — Progress Notes (Addendum)
Blood bank notified unit of blood was ready for patient. Took pre-15 minute VSs and patient had a temp of 102.2. Administered 650 mg PO tylenol. Notified J. Olena Heckle, NP. He stated to please wait to administer blood when temperature has reduced to 100.0.  ?

## 2021-06-29 NOTE — Progress Notes (Signed)
Transition of Care (TOC) Screening Note ? ?Patient Details  ?Name: Crystal Gibbs ?Date of Birth: 12/23/1975 ? ?Transition of Care (TOC) CM/SW Contact:    ?Sherie Don, LCSW ?Phone Number: ?06/29/2021, 10:26 AM ? ?Transition of Care Department [facility]) has reviewed patient and no TOC needs have been identified at this time. We will continue to monitor patient advancement through interdisciplinary progression rounds. If new patient transition needs arise, please place a TOC consult. ?

## 2021-06-29 NOTE — Progress Notes (Signed)
HR sustaining in 130s.Patient has been coughing and oob to [facility] as well. Notified Kc, Ramesh. Will continue to monitor.  ?

## 2021-06-29 NOTE — Progress Notes (Deleted)
[facility] OFFICE PROGRESS NOTE  Simmons-Robinson, Tawanna Cooler, MD [address] [place] [address]  DIAGNOSIS: ***  PRIOR THERAPY:  CURRENT THERAPY:  INTERVAL HISTORY: Crystal Gibbs 46 y.o. female returns for *** regular *** visit for followup of ***   MEDICAL HISTORY: Past Medical History:  Diagnosis Date   Cancer [facility])    Family history of breast cancer 08/23/2020   Family history of pancreatic cancer 08/23/2020   Family history of prostate cancer 08/23/2020   Family history of stomach cancer 08/23/2020   Fibroid    History of multiple miscarriages    x 3   Hypertension     ALLERGIES:  has No Known Allergies.  MEDICATIONS:  No current facility-administered medications for this visit.   No current outpatient medications on file.   Facility-Administered Medications Ordered in Other Visits  Medication Dose Route Frequency Provider Last Rate Last Admin   0.9 %  sodium chloride infusion  75 mL/hr Intravenous Continuous Therisa Doyne, MD 75 mL/hr at 06/29/21 0601 Infusion Verify at 06/29/21 0601   acetaminophen (TYLENOL) 325 MG tablet            acetaminophen (TYLENOL) tablet 650 mg  650 mg Oral Q6H PRN Therisa Doyne, MD   650 mg at 06/29/21 4696   Or   acetaminophen (TYLENOL) suppository 650 mg  650 mg Rectal Q6H PRN Doutova, Anastassia, MD       albuterol (PROVENTIL) (2.5 MG/3ML) 0.083% nebulizer solution 2.5 mg  2.5 mg Nebulization Q2H PRN Doutova, Anastassia, MD       ceFEPIme (MAXIPIME) 2 g in sodium chloride 0.9 % 100 mL IVPB  2 g Intravenous Q8H Maurice March, RPH   Stopped at 06/29/21 2952   Chlorhexidine Gluconate Cloth 2 % PADS 6 each  6 each Topical Daily Doutova, Anastassia, MD   6 each at 06/29/21 0127   guaiFENesin (MUCINEX) 12 hr tablet 600 mg  600 mg Oral BID Therisa Doyne, MD   600 mg at 06/29/21 8413   HYDROcodone-acetaminophen (NORCO/VICODIN) 5-325 MG per tablet 1-2 tablet  1-2 tablet Oral Q4H PRN Therisa Doyne, MD    1 tablet at 06/29/21 2440   ketorolac (TORADOL) 15 MG/ML injection 15 mg  15 mg Intravenous Once Kc, Ramesh, MD       magnesium sulfate 2 GM/50ML IVPB            magnesium sulfate 2 GM/50ML IVPB            MEDLINE mouth rinse  15 mL Mouth Rinse BID Doutova, Anastassia, MD   15 mL at 06/29/21 0137   palonosetron (ALOXI) 0.25 MG/5ML injection            vancomycin (VANCOCIN) 1,750 mg in sodium chloride 0.9 % 500 mL IVPB  1,750 mg Intravenous Q24H Maurice March, [facility]        SURGICAL HISTORY:  Past Surgical History:  Procedure Laterality Date   BIOPSY  08/18/2020   Procedure: BIOPSY;  Surgeon: Shellia Cleverly, DO;  Location: [facility];  Service: Gastroenterology;;   CATARACT EXTRACTION Bilateral    ESOPHAGOGASTRODUODENOSCOPY (EGD) WITH PROPOFOL N/A 08/18/2020   Procedure: ESOPHAGOGASTRODUODENOSCOPY (EGD) WITH PROPOFOL;  Surgeon: Shellia Cleverly, DO;  Location: [facility];  Service: Gastroenterology;  Laterality: N/A;   IR IMAGING GUIDED PORT INSERTION  08/13/2020   IR RADIOLOGIST EVAL & MGMT  05/13/2021   PARATHYROIDECTOMY Right 02/19/15    REVIEW OF SYSTEMS:   Review of Systems  Constitutional: Negative for  appetite change, chills, fatigue, fever and unexpected weight change.  HENT:   Negative for mouth sores, nosebleeds, sore throat and trouble swallowing.   Eyes: Negative for eye problems and icterus.  Respiratory: Negative for cough, hemoptysis, shortness of breath and wheezing.   Cardiovascular: Negative for chest pain and leg swelling.  Gastrointestinal: Negative for abdominal pain, constipation, diarrhea, nausea and vomiting.  Genitourinary: Negative for bladder incontinence, difficulty urinating, dysuria, frequency and hematuria.   Musculoskeletal: Negative for back pain, gait problem, neck pain and neck stiffness.  Skin: Negative for itching and rash.  Neurological: Negative for dizziness, extremity weakness, gait problem, headaches, light-headedness and seizures.   Hematological: Negative for adenopathy. Does not bruise/bleed easily.  Psychiatric/Behavioral: Negative for confusion, depression and sleep disturbance. The patient is not nervous/anxious.     PHYSICAL EXAMINATION:  unknown if currently breastfeeding.  ECOG PERFORMANCE STATUS: {CHL ONC ECOG [id number]  Physical Exam  Constitutional: Oriented to person, place, and time and well-developed, well-nourished, and in no distress. No distress.  HENT:  Head: Normocephalic and atraumatic.  Mouth/Throat: Oropharynx is clear and moist. No oropharyngeal exudate.  Eyes: Conjunctivae are normal. Right eye exhibits no discharge. Left eye exhibits no discharge. No scleral icterus.  Neck: Normal range of motion. Neck supple.  Cardiovascular: Normal rate, regular rhythm, normal heart sounds and intact distal pulses.   Pulmonary/Chest: Effort normal and breath sounds normal. No respiratory distress. No wheezes. No rales.  Abdominal: Soft. Bowel sounds are normal. Exhibits no distension and no mass. There is no tenderness.  Musculoskeletal: Normal range of motion. Exhibits no edema.  Lymphadenopathy:    No cervical adenopathy.  Neurological: Alert and oriented to person, place, and time. Exhibits normal muscle tone. Gait normal. Coordination normal.  Skin: Skin is warm and dry. No rash noted. Not diaphoretic. No erythema. No pallor.  Psychiatric: Mood, memory and judgment normal.  Vitals reviewed.  LABORATORY DATA: Lab Results  Component Value Date   WBC 9.1 06/29/2021   HGB 6.1 (LL) 06/29/2021   HCT 19.5 (L) 06/29/2021   MCV 89.0 06/29/2021   PLT 147 (L) 06/29/2021      Chemistry      Component Value Date/Time   NA 134 (L) 06/29/2021 0245   K 4.2 06/29/2021 0245   CL 108 06/29/2021 0245   CO2 20 (L) 06/29/2021 0245   BUN 8 06/29/2021 0245   CREATININE 0.62 06/29/2021 0245   CREATININE 0.57 06/02/2021 1227   CREATININE 0.81 09/02/2015 1136      Component Value Date/Time   CALCIUM  7.3 (L) 06/29/2021 0245   ALKPHOS 273 (H) 06/29/2021 0245   AST 92 (H) 06/29/2021 0245   AST 51 (H) 06/02/2021 1227   ALT 39 06/29/2021 0245   ALT 21 06/02/2021 1227   BILITOT 1.4 (H) 06/29/2021 0245   BILITOT 0.4 06/02/2021 1227       RADIOGRAPHIC STUDIES:  DG Chest 2 View  Result Date: 06/28/2021 CLINICAL DATA:  Shortness of breath.  Cancer patient. EXAM: CHEST - 2 VIEW COMPARISON:  Chest x-ray 06/24/2021.  Chest CT 06/25/2021. FINDINGS: Right chest port catheter tip projects over the distal SVC, unchanged. There is new airspace disease in the right lung base. There are increased central interstitial markings. Pulmonary nodular densities are grossly unchanged. No evidence for pneumothorax or definite pleural effusion. Cardiomediastinal silhouette is stable. Osseous structures are stable. IMPRESSION: 1. New right basilar atelectasis and airspace disease. 2. Increased in central interstitial markings may related to infectious/inflammation or central pulmonary vascular congestion.  Electronically Signed   By: Darliss Cheney M.D.   On: 06/28/2021 21:04   DG Chest 2 View  Result Date: 06/24/2021 CLINICAL DATA:  cough EXAM: CHEST - 2 VIEW COMPARISON:  Chest x-ray 07/23/2015, CT cardiac 05/19/2021 FINDINGS: Right chest wall Port-A-Cath with tip overlying the right atrium. The heart and mediastinal contours are within normal limits. Low lung volumes. Multiple scattered pulmonary nodules noted. No focal consolidation. No pulmonary edema. No pleural effusion. No pneumothorax. No acute osseous abnormality. IMPRESSION: 1. Multiple scattered pulmonary nodules consistent with known metastases. 2. Right chest wall Port-A-Cath with tip overlying the right atrium. Electronically Signed   By: Tish Frederickson M.D.   On: 06/24/2021 22:10   CT Angio Chest PE W and/or Wo Contrast  Result Date: 06/25/2021 CLINICAL DATA:  Cough, fever, nausea/vomiting/diarrhea, body aches, recent travel to [place]. Known liver  cancer. EXAM: CT ANGIOGRAPHY CHEST CT ABDOMEN AND PELVIS WITH CONTRAST TECHNIQUE: Multidetector CT imaging of the chest was performed using the standard protocol during bolus administration of intravenous contrast. Multiplanar CT image reconstructions and MIPs were obtained to evaluate the vascular anatomy. Multidetector CT imaging of the abdomen and pelvis was performed using the standard protocol during bolus administration of intravenous contrast. RADIATION DOSE REDUCTION: This exam was performed according to the departmental dose-optimization program which includes automated exposure control, adjustment of the mA and/or kV according to patient size and/or use of iterative reconstruction technique. CONTRAST:  OMNIPAQUE IOHEXOL 350 MG/ML SOLN COMPARISON:  CT chest dated 05/19/2021. CTA abdomen/pelvis dated 05/19/2021. FINDINGS: CTA CHEST FINDINGS Cardiovascular: Satisfactory opacification of the bilateral pulmonary arteries to the lobar level. No evidence of pulmonary embolism. Although not tailored for evaluation of the thoracic aorta, there is no evidence of thoracic aortic aneurysm. The heart is normal in size.  No pericardial effusion. Right chest port terminates in the upper right atrium. Mediastinum/Nodes: 18 mm short axis right perihilar node (series 3/image 100), previously 13 mm. Visualized thyroid is unremarkable. Lungs/Pleura: Numerous bilateral pulmonary nodules, compatible with metastatic disease, including a 12 mm nodule at the medial right lung base (series 4/image 72) and a 13 mm nodule in the left lower lobe (series 4/image 66). These previously measured 11 and 12 mm respectively. Eventration of the right hemidiaphragm with associated mild right basilar atelectasis. Trace right pleural effusion. No pneumothorax. Musculoskeletal: Visualized osseous structures are within normal limits. Review of the MIP images confirms the above findings. CT ABDOMEN and PELVIS FINDINGS Hepatobiliary:  Multifocal necrotic masses, measuring up to 11.3 cm in the right hepatic lobe (series 4/image 17) and 10.2 cm in the left hepatic lobe (series 4/image 21), previously 8.7 and 10.2 cm respectively. Additional numerous multifocal hepatic metastases throughout the liver, many of which are new from the recent prior. Gallbladder is underdistended but unremarkable. Pancreas: Within normal limits. Spleen: Within normal limits. Adrenals/Urinary Tract: Adrenal glands are within normal limits. 16 mm cyst in the anterior left kidney (series 4/image 40). Right kidney is within normal limits. No hydronephrosis. Mildly thick-walled bladder, although underdistended. Stomach/Bowel: Stomach is within normal limits. No evidence of bowel obstruction. Normal appendix (series 4/image 85). No colonic wall thickening or inflammatory changes. Vascular/Lymphatic: No evidence of abdominal aortic aneurysm. Atherosclerotic calcifications of the abdominal aorta and branch vessels. Retroaortic left renal vein. No suspicious abdominopelvic lymphadenopathy. Reproductive: Uterine fibroids. Bilateral ovaries are within normal limits. Other: Small to moderate abdominopelvic ascites, new. No frank peritoneal nodularity/omental caking. Musculoskeletal: No focal osseous lesions. Review of the MIP images confirms the above  findings. IMPRESSION: No evidence of pulmonary embolism. Large hepatic masses with multifocal hepatic metastases in this patient with known liver cancer, progressive. Multifocal pulmonary metastases, progressive. Right perihilar nodal metastasis, progressive. Small to moderate abdominopelvic ascites, new. No frank peritoneal nodularity/omental caking. Electronically Signed   By: Charline Bills M.D.   On: 06/25/2021 02:15   CT ABDOMEN PELVIS W CONTRAST  Result Date: 06/25/2021 CLINICAL DATA:  Cough, fever, nausea/vomiting/diarrhea, body aches, recent travel to [place]. Known liver cancer. EXAM: CT ANGIOGRAPHY CHEST CT  ABDOMEN AND PELVIS WITH CONTRAST TECHNIQUE: Multidetector CT imaging of the chest was performed using the standard protocol during bolus administration of intravenous contrast. Multiplanar CT image reconstructions and MIPs were obtained to evaluate the vascular anatomy. Multidetector CT imaging of the abdomen and pelvis was performed using the standard protocol during bolus administration of intravenous contrast. RADIATION DOSE REDUCTION: This exam was performed according to the departmental dose-optimization program which includes automated exposure control, adjustment of the mA and/or kV according to patient size and/or use of iterative reconstruction technique. CONTRAST:  OMNIPAQUE IOHEXOL 350 MG/ML SOLN COMPARISON:  CT chest dated 05/19/2021. CTA abdomen/pelvis dated 05/19/2021. FINDINGS: CTA CHEST FINDINGS Cardiovascular: Satisfactory opacification of the bilateral pulmonary arteries to the lobar level. No evidence of pulmonary embolism. Although not tailored for evaluation of the thoracic aorta, there is no evidence of thoracic aortic aneurysm. The heart is normal in size.  No pericardial effusion. Right chest port terminates in the upper right atrium. Mediastinum/Nodes: 18 mm short axis right perihilar node (series 3/image 100), previously 13 mm. Visualized thyroid is unremarkable. Lungs/Pleura: Numerous bilateral pulmonary nodules, compatible with metastatic disease, including a 12 mm nodule at the medial right lung base (series 4/image 72) and a 13 mm nodule in the left lower lobe (series 4/image 66). These previously measured 11 and 12 mm respectively. Eventration of the right hemidiaphragm with associated mild right basilar atelectasis. Trace right pleural effusion. No pneumothorax. Musculoskeletal: Visualized osseous structures are within normal limits. Review of the MIP images confirms the above findings. CT ABDOMEN and PELVIS FINDINGS Hepatobiliary: Multifocal necrotic masses, measuring up to  11.3 cm in the right hepatic lobe (series 4/image 17) and 10.2 cm in the left hepatic lobe (series 4/image 21), previously 8.7 and 10.2 cm respectively. Additional numerous multifocal hepatic metastases throughout the liver, many of which are new from the recent prior. Gallbladder is underdistended but unremarkable. Pancreas: Within normal limits. Spleen: Within normal limits. Adrenals/Urinary Tract: Adrenal glands are within normal limits. 16 mm cyst in the anterior left kidney (series 4/image 40). Right kidney is within normal limits. No hydronephrosis. Mildly thick-walled bladder, although underdistended. Stomach/Bowel: Stomach is within normal limits. No evidence of bowel obstruction. Normal appendix (series 4/image 85). No colonic wall thickening or inflammatory changes. Vascular/Lymphatic: No evidence of abdominal aortic aneurysm. Atherosclerotic calcifications of the abdominal aorta and branch vessels. Retroaortic left renal vein. No suspicious abdominopelvic lymphadenopathy. Reproductive: Uterine fibroids. Bilateral ovaries are within normal limits. Other: Small to moderate abdominopelvic ascites, new. No frank peritoneal nodularity/omental caking. Musculoskeletal: No focal osseous lesions. Review of the MIP images confirms the above findings. IMPRESSION: No evidence of pulmonary embolism. Large hepatic masses with multifocal hepatic metastases in this patient with known liver cancer, progressive. Multifocal pulmonary metastases, progressive. Right perihilar nodal metastasis, progressive. Small to moderate abdominopelvic ascites, new. No frank peritoneal nodularity/omental caking. Electronically Signed   By: Charline Bills M.D.   On: 06/25/2021 02:15     ASSESSMENT/PLAN:  No problem-specific Assessment &  Plan notes found for this encounter.   No orders of the defined types were placed in this encounter.    I spent {CHL ONC TIME VISIT - ONGEX:[id number]} counseling the patient face to face. The  total time spent in the appointment was {CHL ONC TIME VISIT - NUUVO:[id number]}.  Shanty Ginty L Harutyun Monteverde, PA-C 06/29/21

## 2021-06-29 NOTE — Progress Notes (Signed)
?PROGRESS NOTE ?Crystal Gibbs  FGH:[id number] DOB: [birth year] DOA: 06/28/2021 ?PCP: [name], MD  ? ?Brief Narrative/Hospital Course: ?46 y.o. female with medical history significant of intrahepatic cholangiocarcinoma with multiple metastasis followed by oncology Dr  Burr Medico, HLD, HTN, presented with cough/shortness of breath,headache fever, for the past week. She was seen 3 days  PTA in [place] HAD  work up done and sent home but comes with feeling worse, no diarrhea or leg swelling. ?In ED chest x-ray- New right basilar atelectasis and airspace disease. Increased in central interstitial markings may related to infectious/inflammation or central pulmonary vascular congestion ?On 3/18: CT angio as well as CT abdomen and pelvis showed numerous bilateral pulmonary nodules compatible with metastatic disease no PE but there is a large hepatic mass with multifocal hepatic metastases which is progressed from prior.  Vitals with tachycardia UPTO 160S, hypotensive  84/74, febrile 101.5, labs showed leukocytosis, lactic acidosis 2.5 hypomagnesemia, metabolic acidosis elevated ALP. She was given IV fluids and broad-spectrum antibiotics and admitted for sepsis/pneumonia.  ?  ?Subjective: ?Seen and examined.  Having some cough overall feels better, no significant pain. ?Overnight hemoglobin dropped to 6.1 g-1 unit PRBC ordered, also febrile tachycardic BP stable, spo2 stable ?Fever subsided received Toradol and Tylenol earlier. ? ?Assessment and Plan: ? ?Principal Problem: ?  CAP (community acquired pneumonia) ?Active Problems: ?  Sepsis ([place]) ?  Intrahepatic cholangiocarcinoma ([place]) ?  HTN (hypertension) ?  Anemia associated with chemotherapy ?  Hypomagnesemia ?  Anemia of chronic disease ?  Sinus tachycardia ?  ?CAP ?Severe sepsis POA 2/2 above: ?BP stable still tachycardic febrile.  Lactic acidosis has resolved.  Continue with vancomycin, cefepime.  Follow-up blood cultures, sputum culture and urine antigen.  Hematology  oncology has been consulted given patient's complex metastatic cancer currently on immunotherapy.  At risk of decompensation, continue on the stepdown unit ?Recent Labs  ?Lab 06/24/21 ?2345 06/28/21 ?2009 06/28/21 ?2208 06/28/21 ?2213 06/29/21 ?0245 06/29/21 ?0256  ?WBC 7.3 11.4*  --   --  9.1  --   ?LATICACIDVEN  --  2.5* 1.6  --   --  1.3  ?PROCALCITON  --   --   --  3.24 3.23  --   ?  ?Intrahepatic cholangiocarcinoma: Metastatic currently undergoing treatment with Dr. Morey Hummingbird oncology consulted will see the patient today.  Monitor patient's CBC and labs  ? ?HTN: BP stable.  Holding antihypertensives due to sepsis.  ? ?Anemia associated with chemotherapy/chronic disease ?Acute drop in hemoglobin likely multifactorial with aggressive volume replacement/hemodilution: ?No obvious blood loss noted.  Check FOBT.  Transfusing 1 unit PRBC check serial H&H. ?Recent Labs  ?Lab 06/24/21 ?2345 06/28/21 ?2009 06/28/21 ?2022 06/29/21 ?0245 06/29/21 ?0847  ?HGB 8.7* 8.7* 9.5* 6.1* 6.5*  ?HCT 27.2* 27.7* 28.0* 19.5* 20.6*  ?  ?Hypomagnesemia: Resolved ? ?Sinus tachycardia multifactorial with sepsis pneumonia and anemia.  Treat underlying cause.  Continue IV fluids. ? ?Goals of care currently full code await for hematology input defer palliative care to oncology ? ?DVT prophylaxis: SCDs Start: 06/29/21 0126.  Holding chemical prophylaxis due to anemia ?Code Status:   Code Status: Full Code ?Family Communication: plan of care discussed with patien at bedside. ?Disposition: Currently not medically stable for discharge. ?Status is: Inpatient level of care: Stepdown  ?Dispo: The patient is from: home ?           Anticipated d/c is to: TBD ?Mobility Assessment (last 72 hours)   ? ? Mobility Assessment   ?No documentation. ? ?  ?  ? ?  ? ?  Objective: ?Vitals last 24 hrs: ?Vitals:  ? 06/29/21 0830 06/29/21 0900 06/29/21 1038 06/29/21 1042  ?BP:  106/71 115/75 115/75  ?Pulse:  (!) 123 (!) 124 (!) 129  ?Resp:  (!) 25 (!) 23 (!) 28  ?Temp:  99.1 ?F (37.3 ?C)  98.4 ?F (36.9 ?C)   ?TempSrc: Oral  Oral   ?SpO2:  97% 100% 99%  ?Weight:      ?Height:      ? ?Weight change:  ? ?Physical Examination: ?General exam: AA0x3, ill looking, older than stated age, weak appearing. ?HEENT:Oral mucosa moist, Ear/Nose WNL grossly, dentition normal. ?Respiratory system: bilaterally clear BS, no use of accessory muscle ?Cardiovascular system: S1 & S2 +, No JVD,. ?Gastrointestinal system: Abdomen soft, mildly tender right upper quadrant,ND, BS+ ?Nervous System:Alert, awake, moving extremities and grossly nonfocal ?Extremities: LE edema none,distal peripheral pulses palpable.  ?Skin: No rashes,no icterus. ?MSK: Normal muscle bulk,tone, power ?Medications reviewed:  ?Scheduled Meds: ? Chlorhexidine Gluconate Cloth  6 each Topical Daily  ? guaiFENesin  600 mg Oral BID  ? mouth rinse  15 mL Mouth Rinse BID  ? ?Continuous Infusions: ? sodium chloride 75 mL/hr (06/29/21 0601)  ? ceFEPime (MAXIPIME) IV Stopped (06/29/21 0554)  ? vancomycin    ? ? ?  ?Diet Order   ? ?       ?  Diet Heart Room service appropriate? Yes; Fluid consistency: Thin  Diet effective now       ?  ? ?  ?  ? ?  ? ? ?Intake/Output Summary (Last 24 hours) at 06/29/2021 1054 ?Last data filed at 06/29/2021 1042 ?Gross per 24 hour  ?Intake 3029.96 ml  ?Output 450 ml  ?Net 2579.96 ml  ? ?Net IO Since Admission: 2,579.96 mL [06/29/21 1054]  ?Wt Readings from Last 3 Encounters:  ?06/28/21 66.7 kg  ?06/02/21 66.7 kg  ?05/26/21 67.5 kg  ?  ? ?Unresulted Labs (From admission, onward)  ? ?  Start     Ordered  ? 06/29/21 0500  Procalcitonin  Daily,   R     ? 06/28/21 2155  ? 06/29/21 0500  HIV Antibody (routine testing w rflx)  (HIV Antibody (Routine testing w reflex) panel)  Tomorrow morning,   R       ? 06/29/21 0125  ? 06/29/21 0126  Legionella Pneumophila Serogp 1 Ur Ag  (COPD / Pneumonia / Cellulitis / Lower Extremity Wound)  Once,   R       ? 06/29/21 0125  ? 06/28/21 2156  Expectorated Sputum Assessment w Gram Stain,  Rflx to [place] - STAT,   STAT       ? 06/28/21 2156  ? 06/28/21 1948  Blood culture (routine x 2)  BLOOD CULTURE X 2,   STAT     ? 06/28/21 1947  ? ?  ?  ? ?  ?Data Reviewed: I have personally reviewed following labs and imaging studies ?CBC: ?Recent Labs  ?Lab 07/16/21 ?2345 06/28/21 ?2009 06/28/21 ?2022 06/29/21 ?0245 06/29/21 ?0847  ?WBC 7.3 11.4*  --  9.1  --   ?NEUTROABS 4.9 8.8*  --  6.9  --   ?HGB 8.7* 8.7* 9.5* 6.1* 6.5*  ?HCT 27.2* 27.7* 28.0* 19.5* 20.6*  ?MCV 89.2 89.4  --  89.0  --   ?PLT 143* 209  --  147*  --   ? ?Basic Metabolic Panel: ?Recent Labs  ?Lab 07/16/21 ?2345 06/28/21 ?2009 06/28/21 ?2022 06/28/21 ?2213 06/29/21 ?0245  ?NA 129*  132* 135  --  134*  ?K 4.4 4.3 4.5  --  4.2  ?CL 98 102 102  --  108  ?CO2 19* 20*  --   --  20*  ?GLUCOSE 86 98 100*  --  98  ?BUN 13 8 5*  --  8  ?CREATININE 0.72 0.78 0.70  --  0.62  ?CALCIUM 8.2* 8.4*  --   --  7.3*  ?MG  --   --   --  1.4* 1.7  ?PHOS  --   --   --  3.2 3.5  ? ?GFR: ?Estimated Creatinine Clearance: 84 mL/min (by C-G formula based on SCr of 0.62 mg/dL). ?Liver Function Tests: ?Recent Labs  ?Lab 06/24/21 ?2345 06/28/21 ?2213 06/29/21 ?0245  ?AST 63* 98* 92*  ?ALT 31 40 39  ?ALKPHOS 262* 277* 273*  ?BILITOT 0.9 1.1 1.4*  ?PROT 7.4 6.2* 6.1*  ?ALBUMIN 2.8* 2.1* 2.0*  ? ?Recent Labs  ?Lab 06/24/21 ?2345 06/28/21 ?2009  ?LIPASE 29 23  ? ?No results for input(s): AMMONIA in the last 168 hours. ?Coagulation Profile: ?Recent Labs  ?Lab 06/28/21 ?2009  ?INR 1.3*  ? ?BNP (last 3 results) ?No results for input(s): PROBNP in the last 8760 hours. ?HbA1C: ?No results for input(s): HGBA1C in the last 72 hours. ?CBG: ?No results for input(s): GLUCAP in the last 168 hours. ?Lipid Profile: ?No results for input(s): CHOL, HDL, LDLCALC, TRIG, CHOLHDL, LDLDIRECT in the last 72 hours. ?Thyroid Function Tests: ?Recent Labs  ?  06/28/21 ?2208  ?TSH 1.595  ? ?Sepsis Labs: ?Recent Labs  ?Lab 06/28/21 ?2009 06/28/21 ?2208 06/28/21 ?2213 06/29/21 ?0245 06/29/21 ?0256   ?PROCALCITON  --   --  3.24 3.23  --   ?LATICACIDVEN 2.5* 1.6  --   --  1.3  ? ? ?Recent Results (from the past 240 hour(s))  ?Resp Panel by RT-PCR (Flu A&B, Covid) Nasopharyngeal Swab     Status: None

## 2021-06-29 NOTE — Assessment & Plan Note (Signed)
Will replace ?

## 2021-06-30 ENCOUNTER — Inpatient Hospital Stay: Payer: 59 | Admitting: Adult Health

## 2021-06-30 ENCOUNTER — Inpatient Hospital Stay: Payer: 59

## 2021-06-30 ENCOUNTER — Inpatient Hospital Stay: Payer: 59 | Admitting: Physician Assistant

## 2021-06-30 ENCOUNTER — Inpatient Hospital Stay (HOSPITAL_COMMUNITY): Payer: 59

## 2021-06-30 ENCOUNTER — Telehealth: Payer: Self-pay | Admitting: Hospice

## 2021-06-30 DIAGNOSIS — C221 Intrahepatic bile duct carcinoma: Secondary | ICD-10-CM | POA: Diagnosis not present

## 2021-06-30 DIAGNOSIS — J189 Pneumonia, unspecified organism: Secondary | ICD-10-CM | POA: Diagnosis not present

## 2021-06-30 DIAGNOSIS — Z515 Encounter for palliative care: Secondary | ICD-10-CM

## 2021-06-30 LAB — COMPREHENSIVE METABOLIC PANEL
ALT: 32 U/L (ref 0–44)
AST: 57 U/L — ABNORMAL HIGH (ref 15–41)
Albumin: 2 g/dL — ABNORMAL LOW (ref 3.5–5.0)
Alkaline Phosphatase: 244 U/L — ABNORMAL HIGH (ref 38–126)
Anion gap: 6 (ref 5–15)
BUN: 13 mg/dL (ref 6–20)
CO2: 18 mmol/L — ABNORMAL LOW (ref 22–32)
Calcium: 7.7 mg/dL — ABNORMAL LOW (ref 8.9–10.3)
Chloride: 108 mmol/L (ref 98–111)
Creatinine, Ser: 0.64 mg/dL (ref 0.44–1.00)
GFR, Estimated: >60 mL/min (ref >60)
Glucose, Bld: 125 mg/dL — ABNORMAL HIGH (ref 70–99)
Potassium: 4.3 mmol/L (ref 3.5–5.1)
Sodium: 132 mmol/L — ABNORMAL LOW (ref 135–145)
Total Bilirubin: 2 mg/dL — ABNORMAL HIGH (ref 0.3–1.2)
Total Protein: 6.4 g/dL — ABNORMAL LOW (ref 6.5–8.1)

## 2021-06-30 LAB — CBC
HCT: 25 % — ABNORMAL LOW (ref 36.0–46.0)
Hemoglobin: 7.9 g/dL — ABNORMAL LOW (ref 12.0–15.0)
MCH: 28.3 pg (ref 26.0–34.0)
MCHC: 31.6 g/dL (ref 30.0–36.0)
MCV: 89.6 fL (ref 80.0–100.0)
Platelets: 155 10*3/uL (ref 150–400)
RBC: 2.79 MIL/uL — ABNORMAL LOW (ref 3.87–5.11)
RDW: 19.1 % — ABNORMAL HIGH (ref 11.5–15.5)
WBC: 12.6 10*3/uL — ABNORMAL HIGH (ref 4.0–10.5)
nRBC: 0 % (ref 0.0–0.2)

## 2021-06-30 LAB — TYPE AND SCREEN
ABO/RH(D): B POS
Antibody Screen: NEGATIVE
Unit division: 0

## 2021-06-30 LAB — LEGIONELLA PNEUMOPHILA SEROGP 1 UR AG: L. pneumophila Serogp 1 Ur Ag: NEGATIVE

## 2021-06-30 LAB — BPAM RBC
Blood Product Expiration Date: 202304062359
ISSUE DATE / TIME: 202303221021
Unit Type and Rh: 7300

## 2021-06-30 LAB — PROCALCITONIN: Procalcitonin: 31.95 ng/mL

## 2021-06-30 MED ORDER — LIDOCAINE VISCOUS HCL 2 % MT SOLN
15.0000 mL | Freq: Once | OROMUCOSAL | Status: DC
Start: 2021-06-30 — End: 2021-06-30

## 2021-06-30 MED ORDER — MORPHINE SULFATE (PF) 2 MG/ML IV SOLN
2.0000 mg | INTRAVENOUS | Status: DC | PRN
  Administered 2021-06-30: 2 mg via INTRAVENOUS
  Filled 2021-06-30: qty 1

## 2021-06-30 MED ORDER — PANTOPRAZOLE SODIUM 40 MG PO TBEC: 40.0000 mg | DELAYED_RELEASE_TABLET | Freq: Every day | ORAL | Status: DC

## 2021-06-30 MED ORDER — METOPROLOL TARTRATE 12.5 MG HALF TABLET
12.5000 mg | ORAL_TABLET | Freq: Two times a day (BID) | ORAL | Status: DC
  Administered 2021-06-30 – 2021-07-03 (×7): 12.5 mg via ORAL
  Filled 2021-06-30 (×7): qty 1

## 2021-06-30 MED ORDER — CALCIUM CARBONATE ANTACID 500 MG PO CHEW
1.0000 | CHEWABLE_TABLET | Freq: Four times a day (QID) | ORAL | Status: DC | PRN
  Administered 2021-07-01 (×2): 200 mg via ORAL
  Filled 2021-06-30 (×2): qty 1

## 2021-06-30 MED ORDER — KETOROLAC TROMETHAMINE 15 MG/ML IJ SOLN
15.0000 mg | Freq: Once | INTRAMUSCULAR | Status: AC
  Administered 2021-06-30: 15 mg via INTRAVENOUS
  Filled 2021-06-30: qty 1

## 2021-06-30 MED ORDER — POLYETHYLENE GLYCOL 3350 17 GM/SCOOP PO POWD
17.0000 g | Freq: Two times a day (BID) | ORAL | Status: DC | PRN
  Filled 2021-06-30: qty 255

## 2021-06-30 MED ORDER — ALTEPLASE 2 MG IJ SOLR
2.0000 mg | Freq: Once | INTRAMUSCULAR | Status: AC
  Administered 2021-06-30: 2 mg
  Filled 2021-06-30: qty 2

## 2021-06-30 MED ORDER — ALUM & MAG HYDROXIDE-SIMETH 200-200-20 MG/5ML PO SUSP
30.0000 mL | Freq: Four times a day (QID) | ORAL | Status: DC | PRN
  Administered 2021-07-03: 30 mL via ORAL
  Filled 2021-06-30: qty 30

## 2021-06-30 MED ORDER — PANTOPRAZOLE SODIUM 40 MG IV SOLR
40.0000 mg | INTRAVENOUS | Status: DC
  Administered 2021-06-30 – 2021-07-03 (×4): 40 mg via INTRAVENOUS
  Filled 2021-06-30 (×4): qty 10

## 2021-06-30 MED ORDER — ALUM & MAG HYDROXIDE-SIMETH 200-200-20 MG/5ML PO SUSP: 30.0000 mL | Freq: Once | ORAL | Status: DC

## 2021-06-30 MED ORDER — LIDOCAINE 5 % EX PTCH
1.0000 | MEDICATED_PATCH | CUTANEOUS | Status: DC
  Administered 2021-06-30 – 2021-07-03 (×4): 1 via TRANSDERMAL
  Filled 2021-06-30 (×4): qty 1

## 2021-06-30 MED ORDER — POLYETHYLENE GLYCOL 3350 17 G PO PACK: 17.0000 g | PACK | Freq: Two times a day (BID) | ORAL | Status: DC | PRN

## 2021-06-30 MED ORDER — STERILE WATER FOR INJECTION IJ SOLN
INTRAMUSCULAR | Status: AC
Start: 2021-06-30 — End: 2021-06-30
  Filled 2021-06-30: qty 10

## 2021-06-30 MED ORDER — METHYLPREDNISOLONE SODIUM SUCC 40 MG IJ SOLR
40.0000 mg | Freq: Every day | INTRAMUSCULAR | Status: DC
  Administered 2021-06-30 – 2021-07-01 (×2): 40 mg via INTRAVENOUS
  Filled 2021-06-30 (×2): qty 1

## 2021-06-30 NOTE — Progress Notes (Addendum)
?PROGRESS NOTE ?Crystal Gibbs  WHQ:[id number] DOB: 10/30/1975 DOA: 06/28/2021 ?PCP: [name], MD  ? ?Brief Narrative/Hospital Course: ?46 y.o. female with medical history significant of intrahepatic cholangiocarcinoma with multiple metastasis followed by oncology Dr  Burr Medico, HLD, HTN, presented with cough/shortness of breath,headache fever, for the past week. She was seen 3 days  PTA in [facility] HAD  work up done and sent home but comes with feeling worse, no diarrhea or leg swelling. ?In ED chest x-ray- New right basilar atelectasis and airspace disease. Increased in central interstitial markings may related to infectious/inflammation or central pulmonary vascular congestion ?On 3/18: CT angio as well as CT abdomen and pelvis showed numerous bilateral pulmonary nodules compatible with metastatic disease no PE but there is a large hepatic mass with multifocal hepatic metastases which is progressed from prior.  Vitals with tachycardia UPTO 160S, hypotensive  84/74, febrile 101.5, labs showed leukocytosis, lactic acidosis 2.5 hypomagnesemia, metabolic acidosis elevated ALP. She was given IV fluids and broad-spectrum antibiotics and admitted for sepsis/pneumonia. ?  ?Subjective: ?Patient seen and examined this morning.  Husband at the bedside.  She reports chest discomfort cough and shortness of breath with activity and moving.  At rest she feels fine.  Has mild leg edema but no leg pain. ?Patient having temperature spike this morning 103 at 648 am ?Heart rate in 100-130s, blood pressure stable saturating well on 1 L ?Port not working, IV team added [employer] pending ? ?Assessment and Plan: ?Principal Problem: ?  CAP (community acquired pneumonia) ?Active Problems: ?  Sepsis ([place]) ?  Intrahepatic cholangiocarcinoma ([place]) ?  HTN (hypertension) ?  Anemia associated with chemotherapy ?  Hypomagnesemia ?  Anemia of chronic disease ?  Sinus tachycardia ?  ?Suspected CAP ?Severe sepsis POA 2/2 above ?Febrile  episodes: ?Patient still remains febrile intermittently, tachycardic , also with cough and chest pain. BP stable.  Leukocytosis resolved. blood cultures so far negative,urine antigen step neg, legionella pending.continue vancomycin, cefepime hematology oncology has been consulted and SPOKE w/ Dr Burr Medico who will follow along. Patient with metastatic cancer with multifocal pulmonary metastasis likely the cause of her ongoing chest pain.At risk of decompensation, continue on the stepdown unit.  Obtain chest x-ray today ?Recent Labs  ?Lab 06/24/21 ?2345 06/28/21 ?2009 06/28/21 ?2208 06/28/21 ?2213 06/29/21 ?0245 06/29/21 ?0256  ?WBC 7.3 11.4*  --   --  9.1  --   ?LATICACIDVEN  --  2.5* 1.6  --   --  1.3  ?PROCALCITON  --   --   --  3.24 3.23  --   ?  ?Intrahepatic cholangiocarcinoma ?With large hepatic masses with multifocal hepatic metastasis, multifocal pulmonary metastasis progressive ?Small to moderate abdominopelvic ascites new, most likely malignant ?: Metastatic disease currently undergoing treatment with [name] oncology consulted to discuss further and also goals of care given HER DISEASE is progressive, palliative care has been consulted as well.  Patient having pain I added morphine IV, Toradol x1. ? ?Chest pain/cough: Suspect her symptoms are related to her pulmonary metastasis.  Add lidocaine patches pain management as above ? ?HTN: BP controlled, holding antihypertensives due to sepsis.  ? ?Anemia associated with chemotherapy/chronic disease ?Acute drop in hemoglobin likely multifactorial with aggressive volume replacement/hemodilution: ?No obvious blood loss noted.  Pending FOBT.   S/p1 unit PRBC and Hb up, monitor. ?Recent Labs  ?Lab 06/28/21 ?2009 06/28/21 ?2022 06/29/21 ?0245 06/29/21 ?6599 06/29/21 ?1630  ?HGB 8.7* 9.5* 6.1* 6.5* 8.7*  ?HCT 27.7* 28.0* 19.5* 20.6* 26.9*  ?  ?Hypomagnesemia:  Resolved ? ?Sinus tachycardia multifactorial with sepsis pneumonia and anemia.  Treat underlying cause.  Continue  IV fluids. ? ?Goals of care currently full code await for hematology input-palliative care consulted ? ?DVT prophylaxis: SCDs Start: 06/29/21 0126.  Holding chemical prophylaxis due to anemia ?Code Status:   Code Status: Full Code ?Family Communication: plan of care discussed with patien at bedside. ?Disposition: Currently not medically stable for discharge. ?Status is: Inpatient level of care: Stepdown  ?Dispo: The patient is from: home ?           Anticipated d/c is to: TBD ?Mobility Assessment (last 72 hours)   ? ? Mobility Assessment   ?No documentation. ? ?  ?  ? ?  ? ?Objective: ?Vitals last 24 hrs: ?Vitals:  ? 06/30/21 0500 06/30/21 0600 06/30/21 0648 06/30/21 0745  ?BP: 127/81 134/76    ?Pulse:      ?Resp: (!) 27 19    ?Temp:   (!) 103 ?F (39.4 ?C) (!) 101.5 ?F (38.6 ?C)  ?TempSrc:   Oral Oral  ?SpO2:      ?Weight:      ?Height:      ? ?Weight change:  ? ?Physical Examination: ?General exam: AAOX3, in mild distress, tachycardic, older than stated age, weak appearing. ?HEENT:Oral mucosa moist, Ear/Nose WNL grossly, dentition normal. ?Respiratory system: bilaterally basal crackles,no use of accessory muscle ?Cardiovascular system: S1 & S2 +, No JVD,. ?Gastrointestinal system: Abdomen soft, full mildly tender, BS+ ?Nervous System:Alert, awake, moving extremities and grossly nonfocal ?Extremities: edema MILD in foot,distal peripheral pulses palpable.  ?Skin: No rashes,no icterus. ?MSK: Normal muscle bulk,tone, power ? ?Medications reviewed:  ?Scheduled Meds: ? Chlorhexidine Gluconate Cloth  6 each Topical Daily  ? guaiFENesin  600 mg Oral BID  ? ketorolac  15 mg Intravenous Once  ? mouth rinse  15 mL Mouth Rinse BID  ? methylPREDNISolone (SOLU-MEDROL) injection  40 mg Intravenous Daily  ? pantoprazole (PROTONIX) IV  40 mg Intravenous Q24H  ? sodium chloride flush  10-40 mL Intracatheter Q12H  ? sterile water (preservative free)      ? ?Continuous Infusions: ? ceFEPime (MAXIPIME) IV Stopped (06/30/21 0709)  ?  vancomycin Stopped (06/30/21 0020)  ? ? ?  ?Diet Order   ? ?       ?  Diet Heart Room service appropriate? Yes; Fluid consistency: Thin  Diet effective now       ?  ? ?  ?  ? ?  ? ? ?Intake/Output Summary (Last 24 hours) at 06/30/2021 0948 ?Last data filed at 06/30/2021 0033 ?Gross per 24 hour  ?Intake 2186.31 ml  ?Output 500 ml  ?Net 1686.31 ml  ? ?Net IO Since Admission: 3,951.27 mL [06/30/21 0948]  ?Wt Readings from Last 3 Encounters:  ?06/28/21 66.7 kg  ?06/02/21 66.7 kg  ?05/26/21 67.5 kg  ?  ? ?Unresulted Labs (From admission, onward)  ? ?  Start     Ordered  ? 06/30/21 0642  Comprehensive metabolic panel  ONCE - STAT,   STAT       ?Question:  Specimen collection method  Answer:  Unit=Unit collect  ? 06/30/21 0641  ? 06/30/21 0642  CBC  ONCE - STAT,   STAT       ?Question:  Specimen collection method  Answer:  Unit=Unit collect  ? 06/30/21 0641  ? 06/29/21 1632  Occult blood card to lab, stool  Once,   R       ? 06/29/21 1631  ?  06/29/21 0500  Procalcitonin  Daily,   R     ? 06/28/21 2155  ? 06/29/21 0126  Legionella Pneumophila Serogp 1 Ur Ag  (COPD / Pneumonia / Cellulitis / Lower Extremity Wound)  Once,   R       ? 06/29/21 0125  ? 06/28/21 2156  Expectorated Sputum Assessment w Gram Stain, Rflx to [place] - STAT,   STAT       ? 06/28/21 2156  ? 06/28/21 1948  Blood culture (routine x 2)  BLOOD CULTURE X 2,   STAT     ? 06/28/21 1947  ? ?  ?  ? ?  ?Data Reviewed: I have personally reviewed following labs and imaging studies ?CBC: ?Recent Labs  ?Lab 07/06/2021 ?2345 06/28/21 ?2009 06/28/21 ?2022 06/29/21 ?0245 06/29/21 ?2458 06/29/21 ?1630  ?WBC 7.3 11.4*  --  9.1  --   --   ?NEUTROABS 4.9 8.8*  --  6.9  --   --   ?HGB 8.7* 8.7* 9.5* 6.1* 6.5* 8.7*  ?HCT 27.2* 27.7* 28.0* 19.5* 20.6* 26.9*  ?MCV 89.2 89.4  --  89.0  --   --   ?PLT 143* 209  --  147*  --   --   ? ?Basic Metabolic Panel: ?Recent Labs  ?Lab 06-Jul-2021 ?2345 06/28/21 ?2009 06/28/21 ?2022 06/28/21 ?2213 06/29/21 ?0245  ?NA 129* 132* 135  --   134*  ?K 4.4 4.3 4.5  --  4.2  ?CL 98 102 102  --  108  ?CO2 19* 20*  --   --  20*  ?GLUCOSE 86 98 100*  --  98  ?BUN 13 8 5*  --  8  ?CREATININE 0.72 0.78 0.70  --  0.62  ?CALCIUM 8.2* 8.4*  --   --  7.3*  ?MG  --

## 2021-06-30 NOTE — Telephone Encounter (Signed)
NP called to check in on patient; left Romesa a vm with call back number.  ?

## 2021-06-30 NOTE — Discharge Planning (Signed)
Oncology Discharge Planning Admission Note ? ?[facility] [place] [facility] ?Address: [address], [place], [place] [address] ?Hours of Operation:  8am - 5pm, Monday - Friday  ?Clinic Contact Information:  [phone]) [phone] ? ?Oncology Care Team: ?Medical Oncologist:  Dr. [name] ? ?Myrtha Mantis - NP is aware of this hospital admission dated 06/28/21.  The cancer center will follow Brandywine Arnone?s inpatient care to assist with discharge planning as indicated by the oncologist.  We will schedule any necessary follow up prior to discharge if possible. ? ? ?Disclaimer:  This [place] note does not imply a formal consult request has been made by the admitting attending for this admission or there will be an inpatient consult completed by oncology.  Please request oncology consults as per standard process as indicated. ?

## 2021-06-30 NOTE — Progress Notes (Addendum)
HEMATOLOGY-ONCOLOGY PROGRESS NOTE ? ?ASSESSMENT AND PLAN: ?1.  Intrahepatic cholangiocarcinoma with progressive metastases ?2.  Ascites secondary #1 ?3.  Sepsis ?4.  Pneumonia ?5.  Normocytic anemia ?6.  Transaminitis hyperbilirubinemia ?7.  Goals of care ? ?-I discussed the CT scan findings with the patient, her husband, and her niece who acted as interpreter on the telephone.  We discussed that her scans show progressive disease.  We further discussed that chemotherapy has not been effective in terms of controlling her disease. ?-We had a discussion regarding transitioning our focus to comfort measures/hospice.  The patient continues to not be interested in hospice.  She would like to have further discussion with [name] regarding any additional treatment options. ?-Continue treatment of sepsis/pneumonia per primary team. ?-Transfuse PRBCs for hemoglobin less than 7.5. ?-Agree with palliative care consult for ongoing goals of care discussion. ? ?Mikey Bussing, DNP, AGPCNP-BC, AOCNP ? ?SUBJECTIVE: Crystal Gibbs is followed by our office for metastatic intrahepatic cholangiocarcinoma.  She had recent restaging scans in early February 2023 which showed disease progression.  Treatment was changed to FOLFIRI.  Additionally, palliative care/hospice was discussed with her and she declined at that time.  Now admitted with pneumonia.  CT scans obtained on admission showed no PE but did show large hepatic masses with multifocal hepatic metastases which were progressive, multifocal pulmonary metastases which were progressive, right perihilar nodal metastasis which was progressive, and small to moderate abdominopelvic ascites which was new. ? ?This morning, I saw the patient with her husband in the room.  I offered to use the interpreter services but she preferred to get her niece on the telephone.  She reports some chest discomfort this morning.  She also reports abdominal distention.  She is not having any nausea or  vomiting. ? ?Oncology History Overview Note  ?Cancer Staging ?Intrahepatic cholangiocarcinoma ([place]) ?Staging form: Intrahepatic Bile Duct, AJCC 8th Edition ?- Clinical: Stage IB (cT1b, cN0, cM0) - Signed by [name], MD on 08/04/2020 ? ?  ?Intrahepatic cholangiocarcinoma ([place])  ?08/19/2019 Procedure  ? Upper Endoscopy by Dr [name]  ?IMPRESSION ?- Normal esophagus. ?- Z-line regular, 35 cm from the incisors. ?- Gastritis. Biopsied. ?- Normal incisura, antrum and pylorus. ?- Erythematous duodenopathy. Biopsied. ?- Normal second portion of the duodenum and third portion of the duodenum. ?- No evidence of primary malignancy site noted on this study. ?  ?07/10/2020 Imaging  ? US Abdomen 07/10/20 ?IMPRESSION: ?1.  No acute hepatobiliary findings. ?  ?2. Heterogeneous liver echotexture with 3 masslike areas as ?described measuring 4.3 cm, 5.4 cm and 6.4 cm respectively. ?Recommend CT abdomen/pelvis with intravenous contrast for further evaluation. ?  ?07/10/2020 Imaging  ? CT AP 07/10/20  ?IMPRESSION: ?1. There is a large masslike collection or mass in the central liver ?measuring at least 10.9 x 7.1 x 10.2 cm with 2 smaller adjacent ?satellite lesions/collections measuring 11 and 17 mm. These findings may represent a large abscess with satellite abscesses or a large neoplasm/malignancy. Recommend MRI for further evaluation. ?2. Left renal cyst. ?3. Calcified atherosclerosis in the distal abdominal aorta is mild. ?4. Fibroid uterus. ?  ?07/10/2020 Imaging  ? MRI Abdomen 07/10/20  ?IMPRESSION: ?1. The large central hepatic mass does not demonstrate signal ?characteristics or enhancement typical of a hemangioma or abscess, and is suspicious for malignancy. There is at least 1 satellite lesion centrally in the right lobe. As there are no signs of ?underlying cirrhosis, primary considerations include peripheral ?cholangiocarcinoma and metastatic disease. Tissue sampling ?recommended. ?2. Mild intrahepatic biliary  dilatation within the  left hepatic ?lobe. Distortion of the hepatic vasculature without evidence of ?tumor thrombus. ?3. No extrahepatic metastatic disease or primary malignancy ?identified within the abdomen. ?  ?07/26/2020 Imaging  ? CT AP 07/26/20  ?IMPRESSION: ?1. Markedly poorly visualized and ill-defined 12 cm lesion within ?the liver. This lesion is better evaluated on MR abdomen 07/10/2020. ?2. Otherwise no acute intra-abdominal abnormality with markedly ?limited evaluation on this noncontrast study. ?3.  Aortic Atherosclerosis (ICD10-I70.0). ?  ?07/26/2020 Initial Diagnosis  ? FINAL MICROSCOPIC DIAGNOSIS: 07/26/20 ? ?A. LIVER MASS, LEFT, NEEDLE CORE BIOPSY:  ?- Adenocarcinoma.  ? ?ADDENDUM: The adenocarcinoma is positive with immunohistochemistry for cytokeratin 7 and PAX 8.  The carcinoma is negative with cytokeratin 20, CDX 2, estrogen receptor, GATA3, GCDFP, monoclonal and polyclonal CEA, cytokeratin 5/6, TTF-1, Napsin A, HepPar1, arginase 1 and WT1.  The morphology and immunophenotype are consistent with adenocarcinoma.  The differential for possible primary sites is broad including kidney although the morphology is not typical for more common types of renal cell carcinoma.  Pancreaticobiliary and gynecologic cannot be ruled out.  ?  ?07/30/2020 Imaging  ? CT Chest 07/30/20  ?IMPRESSION: ?1. Pulmonary nodules are highly worrisome for metastatic disease. ?2. Hepatic masses are consistent with biopsy-proven adenocarcinoma and better evaluated on MR abdomen 07/10/2020. ?  ?08/04/2020 Initial Diagnosis  ? Intrahepatic cholangiocarcinoma ([place]) ?  ?08/04/2020 Cancer Staging  ? Staging form: Intrahepatic Bile Duct, AJCC 8th Edition ?- Clinical: Stage IB (cT1b, cN0, cM0) - Signed by [name], MD on 08/04/2020 ? ?  ?08/13/2020 Procedure  ? PAC placement ?  ?08/16/2020 -  Chemotherapy  ? First line gemcitabine and cisplatin 2 weeks on/1 week off starting 08/16/20 ? ? ?  ?08/16/2020 -  Chemotherapy  ? -Addition of durvalumab q3weeks with first-line  chemo starting 08/16/20 ? ?  ?08/18/2020 Procedure  ? Upper Endoscopy by Dr [name] ?IMPRESSION ?- Normal esophagus. ?- Z-line regular, 35 cm from the incisors. ?- Gastritis. Biopsied. ?- Normal incisura, antrum and pylorus. ?- Erythematous duodenopathy. Biopsied. ?- Normal second portion of the duodenum and third portion of the duodenum. ?- No evidence of primary malignancy site noted on this study. ?  ?08/18/2020 Pathology Results  ? FINAL MICROSCOPIC DIAGNOSIS:  ? ?A. DUODENUM, BULB, BIOPSY:  ?-  Erosive and reactive duodenal mucosa with Brunner's gland hyperplasia and mixed inflammation  ?-  No dysplasia or malignancy identified  ? ?B. STOMACH, BIOPSY:  ?-  Chronic active gastritis  ?-  H. pylori organisms present  ?-  No intestinal metaplasia identified  ?-  See comment  ? ?COMMENT:  ?Warth[place]-Starry stain is POSITIVE for organisms consistent with  ?Helicobacter pylori.  ?  ?09/08/2020 Genetic Testing  ? Negative hereditary cancer genetic testing: no pathogenic variants detected in [facility] Multi-Cancer Panel + Pancreatitis Genes.  Variants of uncertain significance detected in RECQL4 at c.119-20C>A (Intronic) and SDHA at c.1039A>G (p.Met347Val).  The report date is September 08, 2020.   ? ?The Multi-Cancer Panel with pancreatitis genes offered by [facility] includes sequencing and/or deletion duplication testing of the following 89 genes: AIP, ALK, APC, ATM, AXIN2,BAP1,  BARD1, BLM, BMPR1A, BRCA1, BRCA2, BRIP1, CASR, CDC73, CDH1, CDK4, CDKN1B, CDKN1C, CDKN2A (p14ARF), CDKN2A (p16INK4a), CEBPA, CFTR, CHEK2, CPA1, CTNNA1, CTRC, DICER1, DIS3L2, EGFR (c.2369C>T, p.Thr790Met variant only), EPCAM (Deletion/duplication testing only), FH, FLCN, GATA2, GPC3, GREM1 (Promoter region deletion/duplication testing only), HOXB13 (c.251G>A, p.Gly84Glu), HRAS, KIT, MAX, MEN1, MET, MITF (c.952G>A, p.Glu318Lys variant only), MLH1, MSH2, MSH3, MSH6, MUTYH, NBN, NF1, NF2, NTHL1, PALB2,  PDGFRA, PHOX2B, PMS2, POLD1, POLE, POT1, PRKAR1A, PRSS1,  PTCH1, PTEN, RAD50, RAD51C, RAD51D, RB1, RECQL4, RET, RNF43, RUNX1, SDHAF2, SDHA (sequence changes only), SDHB, SDHC, SDHD, SMAD4, SMARCA4, SMARCB1, SMARCE1, SPINK1, STK11, SUFU, TERC, TERT, TMEM127, TP53, TSC1, T

## 2021-07-01 DIAGNOSIS — J189 Pneumonia, unspecified organism: Secondary | ICD-10-CM | POA: Diagnosis not present

## 2021-07-01 MED ORDER — MUSCLE RUB 10-15 % EX CREA
TOPICAL_CREAM | CUTANEOUS | Status: DC | PRN
  Filled 2021-07-01: qty 85

## 2021-07-01 NOTE — Progress Notes (Signed)
?PROGRESS NOTE ?Crystal Gibbs  OMA:[id number] DOB: 07-Nov-1975 DOA: 06/28/2021 ?PCP: [name], MD  ? ?Brief Narrative/Hospital Course: ?46 y.o. female with medical history significant of intrahepatic cholangiocarcinoma with multiple metastasis followed by oncology Dr  Burr Medico, HLD, HTN, presented with cough/shortness of breath,headache fever, for the past week. She was seen 3 days  PTA in [place] HAD  work up done and sent home but comes with feeling worse, no diarrhea or leg swelling. ?In ED chest x-ray- New right basilar atelectasis and airspace disease. Increased in central interstitial markings may related to infectious/inflammation or central pulmonary vascular congestion ?On 3/18: CT angio as well as CT abdomen and pelvis showed numerous bilateral pulmonary nodules compatible with metastatic disease no PE but there is a large hepatic mass with multifocal hepatic metastases which is progressed from prior.  Vitals with tachycardia UPTO 160S, hypotensive  84/74, febrile 101.5, labs showed leukocytosis, lactic acidosis 2.5 hypomagnesemia, metabolic acidosis elevated ALP. She was given IV fluids and broad-spectrum antibiotics and admitted for sepsis/pneumonia. ?  ?Subjective: ?Seen and examined this morning.  Patient reports he feels much improved this morning less chest pain less short of breath.  Heart rate has been stable,Afebrile since 7 am yesterday ?Afebrile overnight heart rate improved in the 80s was placed on metoprolol yesterday, ?Husband at the bedside chair. ? ?Assessment and Plan: ?Principal Problem: ?  CAP (community acquired pneumonia) ?Active Problems: ?  Sepsis ([place]) ?  Intrahepatic cholangiocarcinoma ([place]) ?  HTN (hypertension) ?  Anemia associated with chemotherapy ?  Hypomagnesemia ?  Anemia of chronic disease ?  Sinus tachycardia ?  ?Suspected CAP ?Severe sepsis POA 2/2 above ?Febrile episodes: ?Patient afebrile past 24 hours, heart rate improved after metoprolol.Leukocytosis resolved. blood  cultures so far negative,urine antigen step/legionella NEG. patient has dyspnea and chest pain from pulmonary midsagittal.  Continue with current antibiotics-vancomycin/cefepime.  Was also placed on a steroid 3/23 due to wheezing and dyspnea.  Appreciate oncology input.  Continue pain control.  ?Recent Labs  ?Lab 06/24/21 ?2345 06/28/21 ?2009 06/28/21 ?2208 06/28/21 ?2213 06/29/21 ?0245 06/29/21 ?0256 06/30/21 ?1040 06/30/21 ?1200  ?WBC 7.3 11.4*  --   --  9.1  --  12.6*  --   ?LATICACIDVEN  --  2.5* 1.6  --   --  1.3  --   --   ?PROCALCITON  --   --   --  3.24 3.23  --   --  31.95  ?  ?Intrahepatic cholangiocarcinoma ?large hepatic masses with multifocal hepatic metastasis ?Multifocal pulmonary metastasis progressive ?Small to moderate abdominopelvic ascites new, most likely malignant ?: Metastatic disease currently undergoing treatment with Dr. Alfonse Alpers scan shows worsening/progressive metastatic disease palliative care/hospice has been offered to the patient by oncology team but patient does not feel ready.  Continue to address [place]. PMT on board. ?Continue with pain management opiates, Toradol as needed ? ?Chest pain/cough/dyspnea: Mostly from her pulmonary metastasis.  Continue pain management as above.  Cont lidocaine patches. ? ?HTN: BP controlled.placed on metoprolol for sinus tachycardia.   ? ?Anemia associated with chemotherapy/chronic disease ?Acute drop in hemoglobin likely multifactorial with aggressive volume replacement/hemodilution: ?No obvious blood loss noted. FOBT ordered. S/p1 unit PRBC and Hb up, monitor. ?Recent Labs  ?Lab 06/28/21 ?2022 06/29/21 ?0245 06/29/21 ?1423 06/29/21 ?1630 06/30/21 ?1040  ?HGB 9.5* 6.1* 6.5* 8.7* 7.9*  ?HCT 28.0* 19.5* 20.6* 26.9* 25.0*  ?  ?Hypomagnesemia: Resolved ? ?Sinus tachycardia :multifactorial with sepsis pneumonia and anemia.  Treat underlying cause.  Started low-dose metoprolol  with good response ? ?Goals of care currently full code :  Hospice/palliative care offered, patient does not feel ready, awaiting for palliative care discussion for GOC/CODE STATUS  ? ?DVT prophylaxis: SCDs Start: 06/29/21 0126.  Holding chemical prophylaxis due to anemia ?Code Status:   Code Status: Full Code ?Family Communication: plan of care discussed with patien at bedside. ?Disposition:Currently not medically stable for discharge. ?Status NW:[id number] level of care: Stepdown  ?Dispo:The patient is from: home ?          Anticipated d/c is to: TBD.  Needing palliative care evaluation. ?Mobility Assessment (last 72 hours)   ? ? Mobility Assessment   ?No documentation. ? ?  ?  ? ?  ? ?Objective: ?Vitals last 24 hrs: ?Vitals:  ? 07/01/21 0600 07/01/21 0700 07/01/21 0800 07/01/21 0916  ?BP: 135/84 107/78  128/86  ?Pulse:  78  (!) 101  ?Resp: (!) 26 16    ?Temp:   98 ?F (36.7 ?C)   ?TempSrc:   Oral   ?SpO2: 98% 97%    ?Weight:      ?Height:      ? ?Weight change:  ? ?Physical Examination: ?General exam: AAOX3,ANXIOUSolder than stated age, weak appearing. ?HEENT:Oral mucosa moist, Ear/Nose WNL grossly, dentition normal. ?Respiratory system: bilaterally diminished at base, no use of accessory muscle ?Cardiovascular system: S1 & S2 +, No JVD,. ?Gastrointestinal system: Abdomen soft,NT,ND,BS+ ?Nervous System:Alert, awake, moving extremities and grossly nonfocal ?Extremities: LE ankle edema NONE, distal peripheral pulses palpable.  ?Skin: No rashes,no icterus. ?MSK: Normal muscle bulk,tone, power  ? ?Medications reviewed:  ?Scheduled Meds: ? Chlorhexidine Gluconate Cloth  6 each Topical Daily  ? guaiFENesin  600 mg Oral BID  ? lidocaine  1 patch Transdermal Q24H  ? mouth rinse  15 mL Mouth Rinse BID  ? methylPREDNISolone (SOLU-MEDROL) injection  40 mg Intravenous Daily  ? metoprolol tartrate  12.5 mg Oral BID  ? pantoprazole (PROTONIX) IV  40 mg Intravenous Q24H  ? sodium chloride flush  10-40 mL Intracatheter Q12H  ? ?Continuous Infusions: ? ceFEPime (MAXIPIME) IV Stopped  (07/01/21 0630)  ? vancomycin Stopped (07/01/21 0200)  ? ? ?  ?Diet Order   ? ?       ?  Diet Heart Room service appropriate? Yes; Fluid consistency: Thin  Diet effective now       ?  ? ?  ?  ? ?  ? ? ?Intake/Output Summary (Last 24 hours) at 07/01/2021 0918 ?Last data filed at 07/01/2021 0600 ?Gross per 24 hour  ?Intake 839.6 ml  ?Output --  ?Net 839.6 ml  ? ?Net IO Since Admission: 4,790.87 mL [07/01/21 0918]  ?Wt Readings from Last 3 Encounters:  ?06/28/21 66.7 kg  ?06/02/21 66.7 kg  ?05/26/21 67.5 kg  ?  ? ?Unresulted Labs (From admission, onward)  ? ?  Start     Ordered  ? 07/02/21 6578  Basic metabolic panel  Daily,   R     ?Question:  Specimen collection method  Answer:  Unit=Unit collect  ? 07/01/21 0720  ? 07/02/21 0500  CBC  Daily,   R     ?Question:  Specimen collection method  Answer:  Unit=Unit collect  ? 07/01/21 0720  ? 06/29/21 1632  Occult blood card to lab, stool  Once,   R       ? 06/29/21 1631  ? 06/28/21 2156  Expectorated Sputum Assessment w Gram Stain, Rflx to Resp Cult  ONCE - STAT,   STAT       ?  06/28/21 2156  ? ?  ?  ? ?  ?Data Reviewed: I have personally reviewed following labs and imaging studies ?CBC: ?Recent Labs  ?Lab 2021-07-17 ?2345 06/28/21 ?2009 06/28/21 ?2022 06/29/21 ?0245 06/29/21 ?9924 06/29/21 ?1630 06/30/21 ?1040  ?WBC 7.3 11.4*  --  9.1  --   --  12.6*  ?NEUTROABS 4.9 8.8*  --  6.9  --   --   --   ?HGB 8.7* 8.7* 9.5* 6.1* 6.5* 8.7* 7.9*  ?HCT 27.2* 27.7* 28.0* 19.5* 20.6* 26.9* 25.0*  ?MCV 89.2 89.4  --  89.0  --   --  89.6  ?PLT 143* 209  --  147*  --   --  155  ? ?Basic Metabolic Panel: ?Recent Labs  ?Lab 2021-07-17 ?2345 06/28/21 ?2009 06/28/21 ?2022 06/28/21 ?2213 06/29/21 ?0245 06/30/21 ?1200  ?NA 129* 132* 135  --  134* 132*  ?K 4.4 4.3 4.5  --  4.2 4.3  ?CL 98 102 102  --  108 108  ?CO2 19* 20*  --   --  20* 18*  ?GLUCOSE 86 98 100*  --  98 125*  ?BUN 13 8 5*  --  8 13  ?CREATININE 0.72 0.78 0.70  --  0.62 0.64  ?CALCIUM 8.2* 8.4*  --   --  7.3* 7.7*  ?MG  --   --   --   1.4* 1.7  --   ?PHOS  --   --   --  3.2 3.5  --   ? ?GFR: ?Estimated Creatinine Clearance: 84 mL/min (by C-G formula based on SCr of 0.64 mg/dL). ?Liver Function Tests: ?Recent Labs  ?Lab Jul 17, 2021 ?2345 06/28/21 ?2213 03/

## 2021-07-02 ENCOUNTER — Inpatient Hospital Stay: Payer: 59

## 2021-07-02 DIAGNOSIS — J189 Pneumonia, unspecified organism: Secondary | ICD-10-CM | POA: Diagnosis not present

## 2021-07-02 DIAGNOSIS — Z7189 Other specified counseling: Secondary | ICD-10-CM

## 2021-07-02 DIAGNOSIS — Z515 Encounter for palliative care: Secondary | ICD-10-CM | POA: Diagnosis not present

## 2021-07-02 DIAGNOSIS — I10 Essential (primary) hypertension: Secondary | ICD-10-CM | POA: Diagnosis not present

## 2021-07-02 DIAGNOSIS — D6481 Anemia due to antineoplastic chemotherapy: Secondary | ICD-10-CM | POA: Diagnosis not present

## 2021-07-02 LAB — BASIC METABOLIC PANEL
Anion gap: 5 (ref 5–15)
BUN: 28 mg/dL — ABNORMAL HIGH (ref 6–20)
CO2: 18 mmol/L — ABNORMAL LOW (ref 22–32)
Calcium: 8.3 mg/dL — ABNORMAL LOW (ref 8.9–10.3)
Chloride: 109 mmol/L (ref 98–111)
Creatinine, Ser: 0.74 mg/dL (ref 0.44–1.00)
GFR, Estimated: >60 mL/min (ref >60)
Glucose, Bld: 117 mg/dL — ABNORMAL HIGH (ref 70–99)
Potassium: 4.8 mmol/L (ref 3.5–5.1)
Sodium: 132 mmol/L — ABNORMAL LOW (ref 135–145)

## 2021-07-02 LAB — CBC
HCT: 25.8 % — ABNORMAL LOW (ref 36.0–46.0)
Hemoglobin: 8.3 g/dL — ABNORMAL LOW (ref 12.0–15.0)
MCH: 28.9 pg (ref 26.0–34.0)
MCHC: 32.2 g/dL (ref 30.0–36.0)
MCV: 89.9 fL (ref 80.0–100.0)
Platelets: 170 10*3/uL (ref 150–400)
RBC: 2.87 MIL/uL — ABNORMAL LOW (ref 3.87–5.11)
RDW: 19.2 % — ABNORMAL HIGH (ref 11.5–15.5)
WBC: 14 10*3/uL — ABNORMAL HIGH (ref 4.0–10.5)
nRBC: 0 % (ref 0.0–0.2)

## 2021-07-02 MED ORDER — CAMPHOR-MENTHOL 0.5-0.5 % EX LOTN
TOPICAL_LOTION | CUTANEOUS | Status: DC | PRN
  Filled 2021-07-02: qty 222

## 2021-07-02 MED ORDER — GABAPENTIN 100 MG PO CAPS
100.0000 mg | ORAL_CAPSULE | Freq: Two times a day (BID) | ORAL | Status: DC
  Administered 2021-07-02 – 2021-07-03 (×3): 100 mg via ORAL
  Filled 2021-07-02 (×3): qty 1

## 2021-07-02 MED ORDER — PREDNISONE 20 MG PO TABS
40.0000 mg | ORAL_TABLET | Freq: Every day | ORAL | Status: DC
  Administered 2021-07-02 – 2021-07-03 (×2): 40 mg via ORAL
  Filled 2021-07-02 (×2): qty 2

## 2021-07-02 MED ORDER — BIOTENE DRY MOUTH MT LIQD: 15.0000 mL | OROMUCOSAL | Status: DC | PRN

## 2021-07-02 MED ORDER — MELATONIN 3 MG PO TABS
3.0000 mg | ORAL_TABLET | Freq: Every day | ORAL | Status: DC
Start: 2021-07-02 — End: 2021-07-03
  Administered 2021-07-02: 3 mg via ORAL
  Filled 2021-07-02: qty 1

## 2021-07-02 MED ORDER — ONDANSETRON 4 MG PO TBDP
4.0000 mg | ORAL_TABLET | Freq: Three times a day (TID) | ORAL | Status: DC | PRN
  Administered 2021-07-03: 4 mg via ORAL
  Filled 2021-07-02: qty 1

## 2021-07-02 NOTE — Plan of Care (Signed)
?  Problem: Activity: ?Goal: Ability to tolerate increased activity will improve ?Outcome: Progressing ?  ?Problem: Clinical Measurements: ?Goal: Ability to maintain a body temperature in the normal range will improve ?Outcome: Progressing ?  ?Problem: Education: ?Goal: Knowledge of General Education information will improve ?Description: Including pain rating scale, medication(s)/side effects and non-pharmacologic comfort measures ?Outcome: Progressing ?  ?Problem: Nutrition: ?Goal: Adequate nutrition will be maintained ?Outcome: Progressing ?  ?Problem: Coping: ?Goal: Level of anxiety will decrease ?Outcome: Progressing ?  ?Problem: Elimination: ?Goal: Will not experience complications related to bowel motility ?Outcome: Progressing ?Goal: Will not experience complications related to urinary retention ?Outcome: Progressing ?  ?Problem: Pain Managment: ?Goal: General experience of comfort will improve ?Outcome: Progressing ?  ?

## 2021-07-02 NOTE — Progress Notes (Signed)
?PROGRESS NOTE ?Crystal Gibbs  ZJI:[id number] DOB: May 24, 1975 DOA: 06/28/2021 ?PCP: [name], MD  ?Brief Narrative/Hospital Course: ?46 y.o. female with medical history significant of intrahepatic cholangiocarcinoma with multiple metastasis followed by oncology Dr  Burr Medico, HLD, HTN, presented with cough/shortness of breath,headache fever, for the past week. She was seen 3 days  PTA in [place] HAD  work up done and sent home but comes with feeling worse, no diarrhea or leg swelling. ?In ED chest x-ray- New right basilar atelectasis and airspace disease. Increased in central interstitial markings may related to infectious/inflammation or central pulmonary vascular congestion ?On 3/18: CT angio as well as CT abdomen and pelvis showed numerous bilateral pulmonary nodules compatible with metastatic disease no PE but there is a large hepatic mass with multifocal hepatic metastases which is progressed from prior.  Vitals with tachycardia UPTO 160S, hypotensive  84/74, febrile 101.5, labs showed leukocytosis, lactic acidosis 2.5 hypomagnesemia, metabolic acidosis elevated ALP. She was given IV fluids and broad-spectrum antibiotics and admitted for sepsis/pneumonia.  Managed with broad-spectrum antibiotics, oncology was consulted.  she head recurrent fever and tachycardia.  Culture data so far are unremarkable.  Patient having chest pain dyspnea suspect presentation combination of pneumonia as well as metastatic disease in the lung, given the progressive nature of patient's metastatic disease with large multifocal hepatic metastasis, multifocal pulmonary metastasis and small to moderate ascites hematology-oncology discussed/offered palliative care/hospice but patient was not ready. ?  ?Subjective: ?Seen and examined.  Patient reports he feels better no fever, still have some chest pain and shortness of breath.  Husband at the bedside. ?She reports she is planning to follow-up with hematology oncology to discuss further  treatment after discharge ?Overnight afebrile heart rate and blood pressure stable, on room air. ?Labs shows WBC count slightly up at 14K,stable hb 8.3 ? ?Assessment and Plan: ?Principal Problem: ?  CAP (community acquired pneumonia) ?Active Problems: ?  Sepsis ([place]) ?  Intrahepatic cholangiocarcinoma ([place]) ?  HTN (hypertension) ?  Anemia associated with chemotherapy ?  Hypomagnesemia ?  Anemia of chronic disease ?  Sinus tachycardia ?  ?Suspected CAP ?Severe sepsis POA 2/2 above ?Febrile episodes: ?Afebrile since 3/23.no more tachycardia.Leukocytosis stable- slightly up this am.Blood cultures so far negative,urine antigen step/legionella NEG.patient has dyspnea and chest pain from pulmonary mets-antibiotic de-escalated to cefepime, changed to oral steroid.Appreciate oncology input.  Continue pain control.  ?Recent Labs  ?Lab 06/28/21 ?2009 06/28/21 ?2208 06/28/21 ?2213 06/29/21 ?0245 06/29/21 ?0256 06/30/21 ?1040 06/30/21 ?1200 07/02/21 ?1751  ?WBC 11.4*  --   --  9.1  --  12.6*  --  14.0*  ?LATICACIDVEN 2.5* 1.6  --   --  1.3  --   --   --   ?PROCALCITON  --   --  3.24 3.23  --   --  31.95  --   ?  ?Intrahepatic cholangiocarcinoma ?large hepatic masses with multifocal hepatic metastasis ?Multifocal pulmonary metastasis progressive ?Small to moderate abdominopelvic ascites new, most likely malignant ?: Metastatic disease currently undergoing treatment with Dr. Alfonse Alpers scan shows worsening/progressive metastatic disease palliative care/hospice has been offered to the patient by oncology team but patient does not feel ready.  Continue to address [place]. PMT on board. ?Continue with pain management opiates, Toradol as needed ? ?Chest pain/cough/dyspnea: Mostly from her pulmonary metastasis.  Continue pain management-diarrhea I.N.B.E.D.S., opiates, will also add NSAIDs.  ? ?HTN: BP controlled.placed on metoprolol ( new)  for sinus tachycardia.   ? ?Anemia associated with chemotherapy/chronic disease ?Acute  drop  in hemoglobin likely multifactorial with aggressive volume replacement/hemodilution: ?No obvious blood loss noted.S/p1 unit PRBC and Hb up, monitor. ?Recent Labs  ?Lab 06/29/21 ?0245 06/29/21 ?8032 06/29/21 ?1630 06/30/21 ?1040 07/02/21 ?1224  ?HGB 6.1* 6.5* 8.7* 7.9* 8.3*  ?HCT 19.5* 20.6* 26.9* 25.0* 25.8*  ?  ?Hypomagnesemia: Resolved ? ?Sinus tachycardia :multifactorial with sepsis pneumonia and anemia.  Treat underlying cause.  Improved with metoprolol.   ? ?Goals of care currently full code : Hospice/palliative care offered, patient does not feel ready, awaiting for palliative care discussion for GOC/CODE STATUS  ? ?DVT prophylaxis: SCDs Start: 06/29/21 0126.  Holding chemical prophylaxis due to anemia ?Code Status:   Code Status: Full Code ?Family Communication: plan of care discussed with patien at bedside. ?Disposition:Currently not medically stable for discharge. ?Status MG:NOIBBCWUG level of care: Stepdown transfer to medsurg ?Dispo:The patient is from: home ?          Anticipated d/c is to: Home in next 24 hours if remains stable ? ?Mobility Assessment (last 72 hours)   ? ? Mobility Assessment   ?No documentation. ? ?  ?  ? ?  ? ?Objective: ?Vitals last 24 hrs: ?Vitals:  ? 07/02/21 0013 07/02/21 0200 07/02/21 0400 07/02/21 0424  ?BP: 108/63 109/77 123/79   ?Pulse: 81  75   ?Resp: '16 17 14   '$ ?Temp: 98.2 ?F (36.8 ?C)   97.6 ?F (36.4 ?C)  ?TempSrc: Oral   Oral  ?SpO2: 99% 99% 100%   ?Weight:      ?Height:      ? ?Weight change:  ? ?Physical Examination: ?General exam: AA0X3, OLDER than stated age, weak appearing. ?HEENT:Oral mucosa moist, Ear/Nose WNL grossly, dentition normal. ?Respiratory system: bilaterally diminished at bases,no use of accessory muscle ?Cardiovascular system: S1 & S2 +, No JVD,. ?Gastrointestinal system: Abdomen soft,NT,ND, BS+ ?Nervous System:Alert, awake, moving extremities and grossly nonfocal ?Extremities: edema neg,distal peripheral pulses palpable.  ?Skin: No rashes,no  icterus. ?MSK: Normal muscle bulk,tone, power ? ? ?Medications reviewed:  ?Scheduled Meds: ? Chlorhexidine Gluconate Cloth  6 each Topical Daily  ? guaiFENesin  600 mg Oral BID  ? lidocaine  1 patch Transdermal Q24H  ? mouth rinse  15 mL Mouth Rinse BID  ? methylPREDNISolone (SOLU-MEDROL) injection  40 mg Intravenous Daily  ? metoprolol tartrate  12.5 mg Oral BID  ? pantoprazole (PROTONIX) IV  40 mg Intravenous Q24H  ? sodium chloride flush  10-40 mL Intracatheter Q12H  ? ?Continuous Infusions: ? ceFEPime (MAXIPIME) IV Stopped (07/02/21 8916)  ? ? ?  ?Diet Order   ? ?       ?  Diet Heart Room service appropriate? Yes; Fluid consistency: Thin  Diet effective now       ?  ? ?  ?  ? ?  ? ? ?Intake/Output Summary (Last 24 hours) at 07/02/2021 0741 ?Last data filed at 07/02/2021 9450 ?Gross per 24 hour  ?Intake 738.66 ml  ?Output --  ?Net 738.66 ml  ? ?Net IO Since Admission: 5,529.53 mL [07/02/21 0741]  ?Wt Readings from Last 3 Encounters:  ?06/28/21 66.7 kg  ?06/02/21 66.7 kg  ?05/26/21 67.5 kg  ?  ? ?Unresulted Labs (From admission, onward)  ? ?  Start     Ordered  ? 07/02/21 3888  Basic metabolic panel  Daily,   R     ?Question:  Specimen collection method  Answer:  Unit=Unit collect  ? 07/01/21 0720  ? 07/02/21 0500  CBC  Daily,   R     ?  Question:  Specimen collection method  Answer:  Unit=Unit collect  ? 07/01/21 0720  ? 06/29/21 1632  Occult blood card to lab, stool  Once,   R       ? 06/29/21 1631  ? 2021-07-04 2156  Expectorated Sputum Assessment w Gram Stain, Rflx to [place] - STAT,   STAT       ? July 04, 2021 2156  ? ?  ?  ? ?  ?Data Reviewed: I have personally reviewed following labs and imaging studies ?CBC: ?Recent Labs  ?Lab 2021/07/04 ?2009 07/04/21 ?2022 06/29/21 ?0245 06/29/21 ?8453 06/29/21 ?1630 06/30/21 ?1040 07/02/21 ?6468  ?WBC 11.4*  --  9.1  --   --  12.6* 14.0*  ?NEUTROABS 8.8*  --  6.9  --   --   --   --   ?HGB 8.7*   < > 6.1* 6.5* 8.7* 7.9* 8.3*  ?HCT 27.7*   < > 19.5* 20.6* 26.9* 25.0* 25.8*   ?MCV 89.4  --  89.0  --   --  89.6 89.9  ?PLT 209  --  147*  --   --  155 170  ? < > = values in this interval not displayed.  ? ?Basic Metabolic Panel: ?Recent Labs  ?Lab Jul 04, 2021 ?2009 07/04/2021 ?2022 2021-07-04

## 2021-07-02 NOTE — Consult Note (Signed)
?Palliative Medicine Inpatient Consult Note ? ?Reason for consult:  Metastatic cancer with sepsis ? 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  ? ?Clinical Assessment/Goals of Care: ?I have reviewed medical records including EPIC notes, labs and imaging, received report from bedside RN Crystal Gibbs, assessed the patient.  ?  ?I met with Crystal Gibbs and her husband Crystal Gibbs with interpreter service #[id number] to further discuss diagnosis prognosis, GOC, EOL wishes, disposition and options. ?  ?I introduced Palliative Medicine as specialized medical care for people living with serious illness. It focuses on providing relief from the symptoms and stress of a serious illness. The goal is to improve quality of life for both the patient and the family. ? ?A detailed discussion was had today regarding advanced directives and symptom management.  Concepts specific to code status,  continued IV antibiotics and rehospitalization was had.  The difference between a aggressive medical intervention path  and a palliative comfort care path for this patient at this time was had. Values and goals of care important to patient and family were attempted to be elicited. ? ?Crystal Gibbs is [place]ese and speaks Arabic. She is married with no children. She has  sister that lives nearby.  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  She recently returned from a pilgrimage to [place] to visit a mosque. She was unable to walk while there due to the swelling in her legs. She used a wheelchair.  ? ?Crystal Gibbs expresses great faith and by her Muslim Islam faith believes the doctor has power and makes the decisions. She states she  would not be able to make decisions on her care. "The doctor knows best."  She has no advance directives in  place. She desires to be a full code and to have all available treatments. She desires to go home to be in her own environment. She says she  has an appointment on the 30th with oncology to hear if she will start treatments again. She states she will accept what is offered.  It is Ramadan period but she is not fasting since she is ill. Food should be Hala. ? ?Nursing had reported that Crystal Gibbs was refusing pain medicine other than the patch. I explored this with the patient. She says she does not understand English well and may have misinterpreted the question. She is willing to and wants to take pain medicine. Current rating is 6/10 for right upper quadrant pain. I shared this with her nurse who confirmed with the interpreter that the patient was wiling to take the pain medicine and medicated her. I discussed the goal of keeping her comfortable with a manageable pain level so she can do things she would like to do.   The language barrier may be preventing adequate pain management here. There are notes in her oncology office visits for Gabapentin Rx 06/01/2021 for neuropathic pain.  She reports has significant pain and numbness in arms and legs bilaterally. She states she went to the pharmacy twice but it was not there. I have ordered Gabapentin at the same dose and frequency. She reports back pain that she thinks is from lying in bed.  ? ?Other symptoms to manage  are breathlessness at times, when she cough she has nausea, anxiety only at night, insomnia, sore mouth, fatigue which has improved   some with IV therapy, and pruritis.  ? ?Her husband and her sister are her support system in the [place].  Her husband was present for our full conversation but allowed her to answer all the questions. He agreed with palliative support at discharge. He is using his vacation time to be with her in the hospital but will need to go back to work.  ? ?Discussed the importance of continued conversation with family and their  medical  providers regarding overall plan of care and treatment options, ensuring decisions are within the context of the patients values and GOCs. She is not ready for hospice care at this time but would like palliative outpatient services in home. ? ?Decision Maker: Patient and husband when she cannot ? ?SUMMARY OF RECOMMENDATIONS   ? ?Code Status/Advance Care Planning: ?FULL CODE ?  ?Symptom Management:  ?Pain: gabapentin 100 mg bid to start, hydrocodone-acetaminophen 1-2 tabs every 4 hours prn, Lidoderm patch, morphine injection for severe pain ?Nausea: ondansetron prn ?Cough: guaifenesin, hydrocodone cough syrup prn ?Breathlessness: Albuterol prn IS  ?Oral discomfort- biotene mouthwash ?Insomnia: Melatonin at night  ?Pruritis: Sarna lotion ?GI prophylaxis: pantoprazole ?Bowel regimen: Polyethylene glycol ?Fatigue: May benefit from therapies when able to participate for deconditioning ? ? ?Additional Recommendations (Limitations, Scope, Preferences): ?Full scope of treatment ? ?  ?Psycho-social/Spiritual:  ?Desire for further Chaplaincy support: no ?Additional Recommendations:  ?  ?Prognosis: metastatic hepatic and pulmonary cancer, oncology will see on the 30th, she states  did not do well with 3rd dose if immunotherapy. ? ?Discharge Planning: Home with husband, palliative care outpatient referral at discharge for symptom management. Will need prescription for Gabapentin at discharge if she tolerates it well. She has none at home. ? ?Review of Systems  ?Constitutional:  Positive for chills and fever.  ?HENT: Negative.    ?Eyes: Negative.   ?Respiratory:  Positive for cough, shortness of breath and wheezing.   ?Cardiovascular:  Positive for leg swelling.  ?Gastrointestinal:  Positive for abdominal pain.  ?Genitourinary: Negative.   ?Musculoskeletal:  Positive for back pain.  ?Skin:  Positive for itching.  ?Neurological:  Positive for weakness.  ?Endo/Heme/Allergies: Negative.   ?Psychiatric/Behavioral:    ?     Anxiety  at night  ? ? ?  07/02/2021  ?  9:50 AM 07/02/2021  ?  4:00 AM 07/02/2021  ?  2:00 AM  ?Vitals with BMI  ?Systolic 123 123 109  ?Diastolic 90 79 77  ?Pulse 92 75   ? ? ?Physical Exam ?Constitutional:   ?   Appearance: She is well-developed. She is not ill-appearing.  ?HENT:  ?   Head: Normocephalic and atraumatic.  ?   Mouth/Throat:  ?   Mouth: Mucous membranes are moist.  ?   Comments: Small early ulceration on roof of mouth, tongue coated white, oropharynx pink, no exudate ?Cardiovascular:  ?   Rate and Rhythm: Normal rate and regular rhythm.  ?   Heart sounds: Normal heart sounds.  ?Pulmonary:  ?   Effort: Pulmonary effort is normal.  ?   Comments: BS diminished right lower lobe ?Abdominal:  ?   General: There is distension.  ?   Comments: Tender Right upper quadrant  ?Musculoskeletal:     ?   General: Swelling present.  ?   Cervical back: Neck supple.  ?   Comments: Swelling of bilateral feet, soles  ?Skin: ?   General: Skin is warm and dry.  ?   Capillary Refill: Capillary refill takes less than 2   seconds.  ?   Coloration: Skin is not cyanotic.  ?Neurological:  ?   Mental Status: She is alert and oriented to person, place, and time.  ?   GCS: GCS eye subscore is 4. GCS verbal subscore is 5. GCS motor subscore is 6.  ?Psychiatric:     ?   Mood and Affect: Mood normal.     ?   Speech: Speech normal.     ?   Behavior: Behavior normal.  ? ? ?PPS: 60% ? ? ?This conversation/these recommendations were discussed with patient primary care team, Dr.  Ramesh through secure chat.  ? ?Thank you for the opportunity to participate in the care of this patient and family.  ? ?Total time:2 hrs 10 minutes. ?Greater than 50%  of this time was spent counseling and coordinating care related to the above assessment and plan. ? ?Audrey Snyder, NP ?[facility] ?Team Cell Phone: [phone] ?Please utilize secure chat with additional questions, if there is no response within 30 minutes please call the above phone  number ? ?Palliative Medicine Team providers are available by phone from 7am to 7pm daily and can be reached through the team cell phone.  ?Should this patient require assistance outside of these hours, please cal

## 2021-07-03 ENCOUNTER — Encounter (INDEPENDENT_AMBULATORY_CARE_PROVIDER_SITE_OTHER): Payer: Self-pay

## 2021-07-03 DIAGNOSIS — J189 Pneumonia, unspecified organism: Secondary | ICD-10-CM | POA: Diagnosis not present

## 2021-07-03 LAB — BASIC METABOLIC PANEL
Anion gap: 6 (ref 5–15)
BUN: 28 mg/dL — ABNORMAL HIGH (ref 6–20)
CO2: 19 mmol/L — ABNORMAL LOW (ref 22–32)
Calcium: 8.6 mg/dL — ABNORMAL LOW (ref 8.9–10.3)
Chloride: 109 mmol/L (ref 98–111)
Creatinine, Ser: 0.75 mg/dL (ref 0.44–1.00)
GFR, Estimated: >60 mL/min (ref >60)
Glucose, Bld: 144 mg/dL — ABNORMAL HIGH (ref 70–99)
Potassium: 4.7 mmol/L (ref 3.5–5.1)
Sodium: 134 mmol/L — ABNORMAL LOW (ref 135–145)

## 2021-07-03 LAB — CBC
HCT: 25.4 % — ABNORMAL LOW (ref 36.0–46.0)
Hemoglobin: 8.2 g/dL — ABNORMAL LOW (ref 12.0–15.0)
MCH: 29.1 pg (ref 26.0–34.0)
MCHC: 32.3 g/dL (ref 30.0–36.0)
MCV: 90.1 fL (ref 80.0–100.0)
Platelets: 178 10*3/uL (ref 150–400)
RBC: 2.82 MIL/uL — ABNORMAL LOW (ref 3.87–5.11)
RDW: 19.4 % — ABNORMAL HIGH (ref 11.5–15.5)
WBC: 14.2 10*3/uL — ABNORMAL HIGH (ref 4.0–10.5)
nRBC: 0.1 % (ref 0.0–0.2)

## 2021-07-03 LAB — CULTURE, BLOOD (ROUTINE X 2)
Culture: NO GROWTH
Special Requests: ADEQUATE

## 2021-07-03 LAB — PROCALCITONIN: Procalcitonin: 6.11 ng/mL

## 2021-07-03 MED ORDER — GUAIFENESIN ER 600 MG PO TB12: 600.0000 mg | ORAL_TABLET | Freq: Two times a day (BID) | ORAL | 0 refills | Status: AC

## 2021-07-03 MED ORDER — ALUM & MAG HYDROXIDE-SIMETH 200-200-20 MG/5ML PO SUSP
30.0000 mL | Freq: Four times a day (QID) | ORAL | 0 refills | Status: AC | PRN
Start: 2021-07-03 — End: 2021-07-10

## 2021-07-03 MED ORDER — ONDANSETRON HCL 4 MG PO TABS: 4.0000 mg | ORAL_TABLET | Freq: Three times a day (TID) | ORAL | 0 refills | Status: AC | PRN

## 2021-07-03 MED ORDER — HYDROCODONE-ACETAMINOPHEN 5-325 MG PO TABS: 1.0000 | ORAL_TABLET | ORAL | 0 refills | Status: AC | PRN

## 2021-07-03 MED ORDER — HEPARIN SOD (PORK) LOCK FLUSH 100 UNIT/ML IV SOLN
500.0000 [IU] | INTRAVENOUS | Status: AC | PRN
  Administered 2021-07-03: 500 [IU]
  Filled 2021-07-03: qty 5

## 2021-07-03 MED ORDER — CEFUROXIME AXETIL 250 MG PO TABS
250.0000 mg | ORAL_TABLET | Freq: Two times a day (BID) | ORAL | 0 refills | Status: AC
Start: 2021-07-03 — End: 2021-07-08

## 2021-07-03 MED ORDER — PREDNISONE 20 MG PO TABS: 20.0000 mg | ORAL_TABLET | Freq: Every day | ORAL | 0 refills | Status: AC

## 2021-07-03 MED ORDER — METOPROLOL TARTRATE 25 MG PO TABS: 12.5000 mg | ORAL_TABLET | Freq: Two times a day (BID) | ORAL | 0 refills | Status: AC

## 2021-07-03 NOTE — Plan of Care (Signed)
  Problem: Education: Goal: Knowledge of General Education information will improve Description Including pain rating scale, medication(s)/side effects and non-pharmacologic comfort measures Outcome: Progressing   Problem: Health Behavior/Discharge Planning: Goal: Ability to manage health-related needs will improve Outcome: Progressing   

## 2021-07-03 NOTE — Progress Notes (Addendum)
PHARMACY NOTE -  cefepime ? ?Pharmacy has been assisting with dosing of cefepime for PNA for 7 days as noted per consult to pharmacy (stop date entered for 07/05/21) ?Dosage remains stable at 2gm q8h and need for further dosage adjustment appears unlikely at present.   ? ? ?Will sign off at this time.  Please reconsult if a change in clinical status warrants re-evaluation of dosage. ? ?Dia Sitter, PharmD, BCPS ?07/03/2021 7:23 AM ? ?  ?

## 2021-07-03 NOTE — TOC Transition Note (Signed)
Transition of Care (TOC) - CM/SW Discharge Note ? ? ?Patient Details  ?Name: Crystal Gibbs ?MRN: [id number] ?Date of Birth: 07-07-1975 ? ?Transition of Care (TOC) CM/SW Contact:  ?Tawanna Cooler, RN ?Phone Number: ?07/03/2021, 12:14 PM ? ? ?Clinical Narrative:    ? ?Patient to discharge home today.  Has been on service previously with [facility].  Called [facility] weekend number and spoke with Melissa.  She confirms patient will receive follow up palliative services with them.  [facility] contact information added to the AVS.   ? ? ?Final next level of care: Home/Self Care ?Barriers to Discharge: No Barriers Identified ? ? ?  ?

## 2021-07-03 NOTE — Discharge Summary (Signed)
Physician Discharge Summary  ?Crystal Gibbs IOM:[id number] DOB: 1975/11/07 DOA: 06/28/2021 ? ?PCP: [name], MD ? ?Admit date: 06/28/2021 ?Discharge date: 07/03/2021 ?Recommendations for Outpatient Follow-up:  ?Follow up with PCP and oncology in 1 week-call for appointment ?Please obtain BMP/CBC in one week ? ?Discharge Dispo: Home ?Discharge Condition: Stable ?Code Status:   Code Status: Full Code ?Diet recommendation:  ?Diet Order   ? ?       ?  Diet Heart Room service appropriate? Yes; Fluid consistency: Thin  Diet effective now       ?  ? ?  ?  ? ?  ?  ? ?Brief/Interim Summary: ?46 y.o. female with medical history significant of intrahepatic cholangiocarcinoma with multiple metastasis followed by oncology Dr  Burr Medico, HLD, HTN, presented with cough/shortness of breath,headache fever, for the past week. She was seen 3 days  PTA in Er HAD  work up done and sent home but comes with feeling worse, no diarrhea or leg swelling. ?In ED chest x-ray- New right basilar atelectasis and airspace disease. Increased in central interstitial markings may related to infectious/inflammation or central pulmonary vascular congestion ?On 3/18: CT angio as well as CT abdomen and pelvis showed numerous bilateral pulmonary nodules compatible with metastatic disease no PE but there is a large hepatic mass with multifocal hepatic metastases which is progressed from prior.  Vitals with tachycardia UPTO 160S, hypotensive  84/74, febrile 101.5, labs showed leukocytosis, lactic acidosis 2.5 hypomagnesemia, metabolic acidosis elevated ALP. She was given IV fluids and broad-spectrum antibiotics and admitted for sepsis/pneumonia.  Managed with broad-spectrum antibiotics, oncology was consulted.  she head recurrent fever and tachycardia.  Culture data so far are unremarkable.  Patient having chest pain dyspnea suspect presentation combination of pneumonia as well as metastatic disease in the lung, given the progressive nature of patient's  metastatic disease with large multifocal hepatic metastasis, multifocal pulmonary metastasis and small to moderate ascites hematology-oncology discussed/offered palliative care/hospice but patient was not ready. ?Seen by palliative care because of patient's believe at this time remains full code and patient will continue to follow-up with outpatient palliative care/hematology oncology.  She has now clinically improve  ?Heart rate is stable vitals stable afebrile, husband at bedside, both of them are in agreement for discharge home and they will need to follow-up outpatient palliative care/hematology oncology. ? ?Discharge Diagnoses:  ?Principal Problem: ?  CAP (community acquired pneumonia) ?Active Problems: ?  Sepsis ([place]) ?  Intrahepatic cholangiocarcinoma ([place]) ?  HTN (hypertension) ?  Anemia associated with chemotherapy ?  Hypomagnesemia ?  Anemia of chronic disease ?  Sinus tachycardia ?Suspected CAP ?Severe sepsis POA 2/2 above ?Febrile episodes: ?Afebrile since 3/23.no more tachycardia.Leukocytosis stable- slightly up which is likely from steroid.Blood cultures so far negative,urine antigen step/legionella NEG.patient has dyspnea and chest pain from pulmonary mets-antibiotic de-escalated -and changed to oral Ceftin for discharge continue low-dose steroid for a few more days.  Follow-up with oncology outpatient.   ? ?Intrahepatic cholangiocarcinoma ?large hepatic masses with multifocal hepatic metastasis ?Multifocal pulmonary metastasis progressive ?Small to moderate abdominopelvic ascites new, most likely malignant ?: Metastatic disease currently undergoing treatment with Dr. Alfonse Alpers scan shows worsening/progressive metastatic disease palliative care/hospice has been offered to the patient by oncology team but patient does not feel ready.  Continue to address [place]. PMT on board-outpatient follow-up. ?  ?Chest pain/cough/dyspnea: Mostly from her pulmonary metastasis.  Continue pain management. ?   ?HTN: BP controlled.placed on metoprolol ( new)  for sinus tachycardia.  Amlodipine discontinued ?  ?  Anemia associated with chemotherapy/chronic disease ?Acute drop in hemoglobin likely multifactorial with aggressive volume replacement/hemodilution: ?No obvious blood loss noted.S/p1 unit PRBC and Hb up, monitor as op ? ?Hypomagnesemia: Resolved ?  ?Sinus tachycardia :multifactorial with sepsis pneumonia and anemia.  resovled with metoprolol.   ?  ?Consults: ?Hematology oncology, palliative care ?Subjective: ?Alert awake oriented resting comfortably pleasant.  Some chest discomfort and mild dyspnea but overall improved feels ready for home today. ? ?Discharge Exam: ?Vitals:  ? 07/03/21 0550 07/03/21 0918  ?BP: (!) 117/93 (!) 126/94  ?Pulse: 99 92  ?Resp: 16 18  ?Temp: 97.8 ?F (36.6 ?C) 98.2 ?F (36.8 ?C)  ?SpO2: 100% 99%  ? ?General: Pt is alert, awake, not in acute distress ?Cardiovascular: RRR, S1/S2 +, no rubs, no gallops ?Respiratory: CTA bilaterally, no wheezing, no rhonchi ?Abdominal: Soft, NT, ND, bowel sounds + ?Extremities: no edema, no cyanosis ? ?Discharge Instructions ? ?Discharge Instructions   ? ? Discharge instructions   Complete by: As directed ?  ? Call your oncology office Monday or Tuesday for follow-up ?Follow-up with outpatient palliative care ? ?Please call call MD or return to ER for similar or worsening recurring problem that brought you to hospital or if any fever,nausea/vomiting,abdominal pain, uncontrolled pain, chest pain,  shortness of breath or any other alarming symptoms. ? ?Please follow-up your doctor as instructed in a week time and call the office for appointment. ? ?Please avoid alcohol, smoking, or any other illicit substance and maintain healthy habits including taking your regular medications as prescribed. ? ?You were cared for by a hospitalist during your hospital stay. If you have any questions about your discharge medications or the care you received while you were in the  hospital after you are discharged, you can call the unit and ask to speak with the hospitalist on call if the hospitalist that took care of you is not available. ? ?Once you are discharged, your primary care physician will handle any further medical issues. Please note that NO REFILLS for any discharge medications will be authorized once you are discharged, as it is imperative that you return to your primary care physician (or establish a relationship with a primary care physician if you do not have one) for your aftercare needs so that they can reassess your need for medications and monitor your lab values  ? Increase activity slowly   Complete by: As directed ?  ? ?  ? ?Allergies as of 07/03/2021   ?No Known Allergies ?  ? ?  ?Medication List  ?  ? ?STOP taking these medications   ? ?amLODipine 2.5 MG tablet ?Commonly known as: NORVASC ?  ?azithromycin 250 MG tablet ?Commonly known as: ZITHROMAX ?  ?oxyCODONE 5 MG immediate release tablet ?Commonly known as: Roxicodone ?  ?PRESCRIPTION MEDICATION ?  ?traMADol 50 MG tablet ?Commonly known as: ULTRAM ?  ? ?  ? ?TAKE these medications   ? ?acetaminophen 650 MG CR tablet ?Commonly known as: TYLENOL ?Take 650 mg by mouth every 8 (eight) hours as needed for pain or fever. ?  ?alum & mag hydroxide-simeth 200-200-20 MG/5ML suspension ?Commonly known as: MAALOX/MYLANTA ?Take 30 mLs by mouth every 6 (six) hours as needed for up to 7 days for indigestion or heartburn. ?  ?cefUROXime 250 MG tablet ?Commonly known as: CEFTIN ?Take 1 tablet (250 mg total) by mouth 2 (two) times daily with a meal for 5 days. ?  ?dexamethasone 4 MG tablet ?Commonly known as: DECADRON ?Take 2 tablets at  breakfast for 3 days, starting the day after cisplatin ?  ?diphenoxylate-atropine 2.5-0.025 MG tablet ?Commonly known as: LOMOTIL ?Take 1-2 tablets by mouth 4 (four) times daily as needed for diarrhea or loose stools. ?  ?gabapentin 100 MG capsule ?Commonly known as: Neurontin ?Take 1 capsule (100  mg total) by mouth 2 (two) times daily. ?  ?guaiFENesin 600 MG 12 hr tablet ?Commonly known as: [place] ?Take 1 tablet (600 mg total) by mouth 2 (two) times daily for 10 days. ?  ?HYDROcodone-acetaminophen 5

## 2021-07-03 NOTE — Progress Notes (Signed)
The patient is alert and oriented and has been seen by her physician. The orders for discharge were written. IV port has been de-accessed. Went over discharge instructions with patient and family. She is about to be discharged via wheelchair with all of her belongings.   ?

## 2021-07-04 ENCOUNTER — Other Ambulatory Visit: Payer: Self-pay | Admitting: Hematology

## 2021-07-04 MED ORDER — PANTOPRAZOLE SODIUM 40 MG PO TBEC: 40.0000 mg | DELAYED_RELEASE_TABLET | Freq: Every day | ORAL | 3 refills | Status: AC

## 2021-07-05 ENCOUNTER — Telehealth: Payer: Self-pay | Admitting: *Deleted

## 2021-07-05 NOTE — Telephone Encounter (Signed)
Attempted to follow up on patient since discharge.  No answer on numbers listed on chart. ?

## 2021-07-06 ENCOUNTER — Telehealth (HOSPITAL_COMMUNITY): Payer: Self-pay | Admitting: *Deleted

## 2021-07-06 NOTE — Telephone Encounter (Signed)
Crystal Gibbs' husband Crystal Gibbs was contacted by telephone to verify understanding of discharge instructions status post their most recent discharge from the hospital on the date:  07/03/21.  Inpatient discharge AVS was re-reviewed with patient, along with cancer center appointments.  Verification of understanding for oncology specific follow-up was validated using the Teach Back method.   ? ?Transportation to appointments were confirmed for the patient as being self/caregiver. ? ?Crystal Gibbs?s husband's questions were addressed to their satisfaction upon completion of this post discharge follow-up call for outpatient oncology.  ?

## 2021-07-07 ENCOUNTER — Inpatient Hospital Stay: Payer: 59 | Admitting: Hematology

## 2021-07-07 ENCOUNTER — Inpatient Hospital Stay: Payer: 59

## 2021-07-11 ENCOUNTER — Other Ambulatory Visit: Payer: Self-pay

## 2021-07-11 ENCOUNTER — Inpatient Hospital Stay: Payer: 59 | Admitting: Nurse Practitioner

## 2021-07-11 ENCOUNTER — Encounter: Payer: Self-pay | Admitting: Hematology

## 2021-07-11 ENCOUNTER — Emergency Department (HOSPITAL_COMMUNITY): Payer: 59

## 2021-07-11 ENCOUNTER — Telehealth: Payer: Self-pay

## 2021-07-11 ENCOUNTER — Inpatient Hospital Stay: Payer: 59 | Attending: Hematology

## 2021-07-11 ENCOUNTER — Inpatient Hospital Stay (HOSPITAL_COMMUNITY)
Admission: EM | Admit: 2021-07-11 | Discharge: 2021-08-08 | DRG: 947 | Disposition: E | Payer: 59 | Attending: Internal Medicine | Admitting: Internal Medicine

## 2021-07-11 ENCOUNTER — Encounter (HOSPITAL_COMMUNITY): Payer: Self-pay | Admitting: Internal Medicine

## 2021-07-11 ENCOUNTER — Other Ambulatory Visit: Payer: Self-pay | Admitting: Radiology

## 2021-07-11 ENCOUNTER — Inpatient Hospital Stay (HOSPITAL_BASED_OUTPATIENT_CLINIC_OR_DEPARTMENT_OTHER): Payer: 59 | Admitting: Hematology

## 2021-07-11 ENCOUNTER — Inpatient Hospital Stay: Payer: 59

## 2021-07-11 VITALS — BP 134/71 | HR 129 | Temp 98.3°F | Resp 19

## 2021-07-11 DIAGNOSIS — I10 Essential (primary) hypertension: Secondary | ICD-10-CM | POA: Diagnosis present

## 2021-07-11 DIAGNOSIS — Z8042 Family history of malignant neoplasm of prostate: Secondary | ICD-10-CM

## 2021-07-11 DIAGNOSIS — G893 Neoplasm related pain (acute) (chronic): Secondary | ICD-10-CM | POA: Diagnosis not present

## 2021-07-11 DIAGNOSIS — K7290 Hepatic failure, unspecified without coma: Secondary | ICD-10-CM | POA: Diagnosis present

## 2021-07-11 DIAGNOSIS — C221 Intrahepatic bile duct carcinoma: Secondary | ICD-10-CM | POA: Diagnosis present

## 2021-07-11 DIAGNOSIS — Z66 Do not resuscitate: Secondary | ICD-10-CM | POA: Diagnosis not present

## 2021-07-11 DIAGNOSIS — R748 Abnormal levels of other serum enzymes: Secondary | ICD-10-CM | POA: Diagnosis present

## 2021-07-11 DIAGNOSIS — R52 Pain, unspecified: Secondary | ICD-10-CM | POA: Diagnosis present

## 2021-07-11 DIAGNOSIS — R180 Malignant ascites: Secondary | ICD-10-CM | POA: Diagnosis present

## 2021-07-11 DIAGNOSIS — Z800 Family history of malignant neoplasm of digestive organs: Secondary | ICD-10-CM

## 2021-07-11 DIAGNOSIS — R17 Unspecified jaundice: Secondary | ICD-10-CM

## 2021-07-11 DIAGNOSIS — D630 Anemia in neoplastic disease: Secondary | ICD-10-CM | POA: Diagnosis present

## 2021-07-11 DIAGNOSIS — Z803 Family history of malignant neoplasm of breast: Secondary | ICD-10-CM

## 2021-07-11 DIAGNOSIS — R627 Adult failure to thrive: Secondary | ICD-10-CM | POA: Diagnosis present

## 2021-07-11 DIAGNOSIS — R7989 Other specified abnormal findings of blood chemistry: Secondary | ICD-10-CM | POA: Diagnosis present

## 2021-07-11 DIAGNOSIS — D72829 Elevated white blood cell count, unspecified: Secondary | ICD-10-CM | POA: Diagnosis present

## 2021-07-11 DIAGNOSIS — Z515 Encounter for palliative care: Secondary | ICD-10-CM

## 2021-07-11 DIAGNOSIS — G9341 Metabolic encephalopathy: Secondary | ICD-10-CM | POA: Diagnosis present

## 2021-07-11 DIAGNOSIS — K5900 Constipation, unspecified: Secondary | ICD-10-CM | POA: Diagnosis present

## 2021-07-11 DIAGNOSIS — Z95828 Presence of other vascular implants and grafts: Secondary | ICD-10-CM

## 2021-07-11 DIAGNOSIS — Z833 Family history of diabetes mellitus: Secondary | ICD-10-CM

## 2021-07-11 DIAGNOSIS — R110 Nausea: Secondary | ICD-10-CM | POA: Diagnosis present

## 2021-07-11 DIAGNOSIS — Z751 Person awaiting admission to adequate facility elsewhere: Secondary | ICD-10-CM

## 2021-07-11 DIAGNOSIS — R000 Tachycardia, unspecified: Secondary | ICD-10-CM | POA: Diagnosis present

## 2021-07-11 DIAGNOSIS — E871 Hypo-osmolality and hyponatremia: Secondary | ICD-10-CM | POA: Diagnosis present

## 2021-07-11 DIAGNOSIS — Z6824 Body mass index (BMI) 24.0-24.9, adult: Secondary | ICD-10-CM

## 2021-07-11 DIAGNOSIS — R6510 Systemic inflammatory response syndrome (SIRS) of non-infectious origin without acute organ dysfunction: Secondary | ICD-10-CM | POA: Diagnosis present

## 2021-07-11 DIAGNOSIS — N179 Acute kidney failure, unspecified: Secondary | ICD-10-CM | POA: Diagnosis not present

## 2021-07-11 DIAGNOSIS — D638 Anemia in other chronic diseases classified elsewhere: Secondary | ICD-10-CM | POA: Diagnosis present

## 2021-07-11 DIAGNOSIS — R2689 Other abnormalities of gait and mobility: Secondary | ICD-10-CM | POA: Diagnosis present

## 2021-07-11 DIAGNOSIS — E875 Hyperkalemia: Secondary | ICD-10-CM | POA: Diagnosis present

## 2021-07-11 DIAGNOSIS — Z79899 Other long term (current) drug therapy: Secondary | ICD-10-CM

## 2021-07-11 DIAGNOSIS — C799 Secondary malignant neoplasm of unspecified site: Secondary | ICD-10-CM | POA: Diagnosis present

## 2021-07-11 DIAGNOSIS — Z9841 Cataract extraction status, right eye: Secondary | ICD-10-CM

## 2021-07-11 DIAGNOSIS — R54 Age-related physical debility: Secondary | ICD-10-CM | POA: Diagnosis present

## 2021-07-11 DIAGNOSIS — E876 Hypokalemia: Secondary | ICD-10-CM | POA: Diagnosis not present

## 2021-07-11 DIAGNOSIS — Z9842 Cataract extraction status, left eye: Secondary | ICD-10-CM

## 2021-07-11 LAB — CBC WITH DIFFERENTIAL (CANCER CENTER ONLY)
Abs Immature Granulocytes: 0.55 10*3/uL — ABNORMAL HIGH (ref 0.00–0.07)
Basophils Absolute: 0.1 10*3/uL (ref 0.0–0.1)
Basophils Relative: 0 %
Eosinophils Absolute: 0.1 10*3/uL (ref 0.0–0.5)
Eosinophils Relative: 0 %
HCT: 27.6 % — ABNORMAL LOW (ref 36.0–46.0)
Hemoglobin: 8.7 g/dL — ABNORMAL LOW (ref 12.0–15.0)
Immature Granulocytes: 2 %
Lymphocytes Relative: 8 %
Lymphs Abs: 2.1 10*3/uL (ref 0.7–4.0)
MCH: 28.3 pg (ref 26.0–34.0)
MCHC: 31.5 g/dL (ref 30.0–36.0)
MCV: 89.9 fL (ref 80.0–100.0)
Monocytes Absolute: 1.6 10*3/uL — ABNORMAL HIGH (ref 0.1–1.0)
Monocytes Relative: 7 %
Neutro Abs: 20.8 10*3/uL — ABNORMAL HIGH (ref 1.7–7.7)
Neutrophils Relative %: 83 %
Platelet Count: 185 10*3/uL (ref 150–400)
RBC: 3.07 MIL/uL — ABNORMAL LOW (ref 3.87–5.11)
RDW: 25.4 % — ABNORMAL HIGH (ref 11.5–15.5)
WBC Count: 25.2 10*3/uL — ABNORMAL HIGH (ref 4.0–10.5)
nRBC: 0.2 % (ref 0.0–0.2)

## 2021-07-11 LAB — PROCALCITONIN: Procalcitonin: 2.56 ng/mL

## 2021-07-11 LAB — COMPREHENSIVE METABOLIC PANEL
ALT: 56 U/L — ABNORMAL HIGH (ref 0–44)
AST: 125 U/L — ABNORMAL HIGH (ref 15–41)
Albumin: 2.1 g/dL — ABNORMAL LOW (ref 3.5–5.0)
Alkaline Phosphatase: 434 U/L — ABNORMAL HIGH (ref 38–126)
Anion gap: 10 (ref 5–15)
BUN: 26 mg/dL — ABNORMAL HIGH (ref 6–20)
CO2: 21 mmol/L — ABNORMAL LOW (ref 22–32)
Calcium: 8.9 mg/dL (ref 8.9–10.3)
Chloride: 100 mmol/L (ref 98–111)
Creatinine, Ser: 0.58 mg/dL (ref 0.44–1.00)
GFR, Estimated: >60 mL/min (ref >60)
Glucose, Bld: 84 mg/dL (ref 70–99)
Potassium: 5.1 mmol/L (ref 3.5–5.1)
Sodium: 131 mmol/L — ABNORMAL LOW (ref 135–145)
Total Bilirubin: 8.7 mg/dL — ABNORMAL HIGH (ref 0.3–1.2)
Total Protein: 6.1 g/dL — ABNORMAL LOW (ref 6.5–8.1)

## 2021-07-11 LAB — BODY FLUID CELL COUNT WITH DIFFERENTIAL
Lymphs, Fluid: 22 %
Monocyte-Macrophage-Serous Fluid: 37 % — ABNORMAL LOW (ref 50–90)
Neutrophil Count, Fluid: 41 % — ABNORMAL HIGH (ref 0–25)
Total Nucleated Cell Count, Fluid: 127 cu mm (ref 0–1000)

## 2021-07-11 LAB — CBC WITH DIFFERENTIAL/PLATELET
Abs Immature Granulocytes: 0 10*3/uL (ref 0.00–0.07)
Basophils Absolute: 0 10*3/uL (ref 0.0–0.1)
Basophils Relative: 0 %
Eosinophils Absolute: 0 10*3/uL (ref 0.0–0.5)
Eosinophils Relative: 0 %
HCT: 25.7 % — ABNORMAL LOW (ref 36.0–46.0)
Hemoglobin: 8.2 g/dL — ABNORMAL LOW (ref 12.0–15.0)
Lymphocytes Relative: 7 %
Lymphs Abs: 1.5 10*3/uL (ref 0.7–4.0)
MCH: 29.3 pg (ref 26.0–34.0)
MCHC: 31.9 g/dL (ref 30.0–36.0)
MCV: 91.8 fL (ref 80.0–100.0)
Monocytes Absolute: 1.7 10*3/uL — ABNORMAL HIGH (ref 0.1–1.0)
Monocytes Relative: 8 %
Neutro Abs: 18 10*3/uL — ABNORMAL HIGH (ref 1.7–7.7)
Neutrophils Relative %: 85 %
Platelets: 156 10*3/uL (ref 150–400)
RBC: 2.8 MIL/uL — ABNORMAL LOW (ref 3.87–5.11)
RDW: 25 % — ABNORMAL HIGH (ref 11.5–15.5)
WBC: 21.2 10*3/uL — ABNORMAL HIGH (ref 4.0–10.5)
nRBC: 0.2 % (ref 0.0–0.2)

## 2021-07-11 LAB — CMP (CANCER CENTER ONLY)
ALT: 56 U/L — ABNORMAL HIGH (ref 0–44)
AST: 128 U/L — ABNORMAL HIGH (ref 15–41)
Albumin: 2.7 g/dL — ABNORMAL LOW (ref 3.5–5.0)
Alkaline Phosphatase: 475 U/L — ABNORMAL HIGH (ref 38–126)
Anion gap: 10 (ref 5–15)
BUN: 26 mg/dL — ABNORMAL HIGH (ref 6–20)
CO2: 22 mmol/L (ref 22–32)
Calcium: 9.3 mg/dL (ref 8.9–10.3)
Chloride: 101 mmol/L (ref 98–111)
Creatinine: 0.79 mg/dL (ref 0.44–1.00)
GFR, Estimated: >60 mL/min (ref >60)
Glucose, Bld: 82 mg/dL (ref 70–99)
Potassium: 4.8 mmol/L (ref 3.5–5.1)
Sodium: 133 mmol/L — ABNORMAL LOW (ref 135–145)
Total Bilirubin: 9.2 mg/dL (ref 0.3–1.2)
Total Protein: 6.7 g/dL (ref 6.5–8.1)

## 2021-07-11 LAB — APTT: aPTT: 32 seconds (ref 24–36)

## 2021-07-11 LAB — LACTATE DEHYDROGENASE, PLEURAL OR PERITONEAL FLUID: LD, Fluid: 350 U/L — ABNORMAL HIGH (ref 3–23)

## 2021-07-11 LAB — PROTIME-INR
INR: 1.5 — ABNORMAL HIGH (ref 0.8–1.2)
Prothrombin Time: 18 seconds — ABNORMAL HIGH (ref 11.4–15.2)

## 2021-07-11 LAB — GLUCOSE, PLEURAL OR PERITONEAL FLUID: Glucose, Fluid: 78 mg/dL

## 2021-07-11 MED ORDER — MEGESTROL ACETATE 625 MG/5ML PO SUSP: 625.0000 mg | Freq: Every day | ORAL | Status: DC | PRN

## 2021-07-11 MED ORDER — ACETAMINOPHEN 650 MG RE SUPP: 650.0000 mg | Freq: Four times a day (QID) | RECTAL | Status: DC | PRN

## 2021-07-11 MED ORDER — MORPHINE SULFATE (PF) 2 MG/ML IV SOLN: 2.0000 mg | INTRAVENOUS | Status: DC | PRN

## 2021-07-11 MED ORDER — PROCHLORPERAZINE EDISYLATE 10 MG/2ML IJ SOLN
10.0000 mg | Freq: Four times a day (QID) | INTRAMUSCULAR | Status: DC | PRN
Start: 2021-07-11 — End: 2021-07-14

## 2021-07-11 MED ORDER — SODIUM CHLORIDE 0.9% FLUSH
10.0000 mL | Freq: Once | INTRAVENOUS | Status: AC
  Administered 2021-07-11: 10 mL

## 2021-07-11 MED ORDER — MORPHINE SULFATE (PF) 4 MG/ML IV SOLN
4.0000 mg | Freq: Once | INTRAVENOUS | Status: AC
  Administered 2021-07-11: 4 mg via INTRAVENOUS
  Filled 2021-07-11: qty 1

## 2021-07-11 MED ORDER — PANTOPRAZOLE SODIUM 40 MG PO TBEC
40.0000 mg | DELAYED_RELEASE_TABLET | Freq: Every day | ORAL | Status: DC
Start: 2021-07-11 — End: 2021-07-14
  Administered 2021-07-11 – 2021-07-12 (×2): 40 mg via ORAL
  Filled 2021-07-11 (×2): qty 1

## 2021-07-11 MED ORDER — OXYCODONE HCL 5 MG PO TABS
5.0000 mg | ORAL_TABLET | ORAL | Status: DC | PRN
  Administered 2021-07-11: 5 mg via ORAL
  Filled 2021-07-11: qty 1

## 2021-07-11 MED ORDER — MEGESTROL ACETATE 400 MG/10ML PO SUSP
400.0000 mg | Freq: Every day | ORAL | Status: DC | PRN
  Filled 2021-07-11 (×2): qty 10

## 2021-07-11 MED ORDER — LIDOCAINE HCL 1 % IJ SOLN
INTRAMUSCULAR | Status: AC
Start: 2021-07-11 — End: 2021-07-11
  Administered 2021-07-11: 10 mL
  Filled 2021-07-11: qty 20

## 2021-07-11 MED ORDER — ACETAMINOPHEN 325 MG PO TABS: 650.0000 mg | ORAL_TABLET | Freq: Four times a day (QID) | ORAL | Status: DC | PRN

## 2021-07-11 MED ORDER — FENTANYL CITRATE PF 50 MCG/ML IJ SOSY
50.0000 ug | PREFILLED_SYRINGE | INTRAMUSCULAR | Status: DC | PRN
  Administered 2021-07-11 (×2): 50 ug via INTRAVENOUS
  Filled 2021-07-11 (×2): qty 1

## 2021-07-11 MED ORDER — GABAPENTIN 100 MG PO CAPS: 100.0000 mg | ORAL_CAPSULE | Freq: Two times a day (BID) | ORAL | 0 refills | Status: AC

## 2021-07-11 MED ORDER — HYDROMORPHONE HCL 1 MG/ML IJ SOLN
1.0000 mg | INTRAMUSCULAR | Status: DC | PRN
  Administered 2021-07-11 – 2021-07-13 (×7): 1 mg via INTRAVENOUS
  Filled 2021-07-11 (×7): qty 1

## 2021-07-11 NOTE — Progress Notes (Signed)
YELLOW MEWS NOTE:  ?New admit from ER.  ?Previously Tachycardic, most likely related to pain. Other vitals WNL.  ?Pain medication administered per orders.  ? ? 08/05/2021 1810  ?Assess: MEWS Score  ?Temp 98.4 ?F (36.9 ?C)  ?BP 119/60  ?Pulse Rate (!) 121  ?Resp 20  ?Level of Consciousness Alert  ?SpO2 95 %  ?O2 Device Room Air  ?Assess: MEWS Score  ?MEWS Temp 0  ?MEWS Systolic 0  ?MEWS Pulse 2  ?MEWS RR 0  ?MEWS LOC 0  ?MEWS Score 2  ?MEWS Score Color Yellow  ?Assess: if the MEWS score is Yellow or Red  ?Were vital signs taken at a resting state? Yes  ?Focused Assessment No change from prior assessment  ?Does the patient meet 2 or more of the SIRS criteria? No  ?MEWS guidelines implemented *See Row Information* Yes  ?Treat  ?Pain Scale 0-10  ?Pain Score 9  ?Pain Type Acute pain  ?Take Vital Signs  ?Increase Vital Sign Frequency  Yellow: Q 2hr X 2 then Q 4hr X 2, if remains yellow, continue Q 4hrs  ?Escalate  ?MEWS: Escalate Yellow: discuss with charge nurse/RN and consider discussing with provider and RRT  ?Notify: Charge Nurse/RN  ?Name of Charge Nurse/RN Notified Wayna Chalet, RN  ?Date Charge Nurse/RN Notified 07/13/2021  ?Time Charge Nurse/RN Notified 1810  ?Document  ?Patient Outcome Other (Comment) ?(Remain on unit)  ?Progress note created (see row info) Yes  ?Assess: SIRS CRITERIA  ?SIRS Temperature  0  ?SIRS Pulse 1  ?SIRS Respirations  0  ?SIRS WBC 0  ?SIRS Score Sum  1  ? ?Phineas Real BSN, RN-BC ?

## 2021-07-11 NOTE — H&P (Signed)
?History and Physical  ? ? ?Patient: Crystal Gibbs WTU:[id number] DOB: 01/10/1976 ?DOA: 08/04/2021 ?DOS: the patient was seen and examined on 08/03/2021 ?PCP: Crystal Foster, MD  ?Patient coming from:  [place] ? ?Chief Complaint:  ?Chief Complaint  ?Patient presents with  ? Fluid in Abd  ? ?HPI: Crystal Gibbs is a 46 y.o. female with medical history significant of intrahepatic cholangiocarcinoma w/ mets, HTN, ACD. Presenting with abdominal pain. Recent admission to the hospital CAP and abdominal pain. After discharging to home on 3/26, her abdominal pain returned. She had regular follow up with her oncology team today. She reported intractable abdominal pain. They gave her pain control in the clinic, but it did not provide relief. Labs were drawn and it was noted that she had elevated bilirubin. Onco discussed getting a paracentesis urgently in the ED. So she was sent to the ED for evaluation.  ? ?Per onco's note from today:  ?-Unfortunately her cancer has progressed on recent CT scan, due to her poor performance status, she is not a candidate for more chemotherapy.  Her life expectancy is likely a few months or less.  ?-I recommend home hospice, she agreed today. ? ?After discussion with family, they wish to be admitted for pain control. They are not requesting w/u of her hyperbilirubinemia. They are willing to discuss hospice with the palliative care/hospice team. However, right now they wish the patient to remain full code. ? ?Review of Systems: unable to review all systems due to the inability of the patient to answer questions. ?Past Medical History:  ?Diagnosis Date  ? Cancer [facility])   ? Family history of breast cancer 08/23/2020  ? Family history of pancreatic cancer 08/23/2020  ? Family history of prostate cancer 08/23/2020  ? Family history of stomach cancer 08/23/2020  ? Fibroid   ? 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 Hypertension   ? ?Past Surgical History:  ?Procedure Laterality Date  ? BIOPSY   08/18/2020  ? Procedure: BIOPSY;  Surgeon: Lavena Bullion, DO;  Location: [facility];  Service: Gastroenterology;;  ? CATARACT EXTRACTION Bilateral   ? ESOPHAGOGASTRODUODENOSCOPY (EGD) WITH PROPOFOL N/A 08/18/2020  ? Procedure: ESOPHAGOGASTRODUODENOSCOPY (EGD) WITH PROPOFOL;  Surgeon: Lavena Bullion, DO;  Location: [facility];  Service: Gastroenterology;  Laterality: N/A;  ? IR IMAGING GUIDED PORT INSERTION  08/13/2020  ? IR RADIOLOGIST EVAL & MGMT  05/13/2021  ? PARATHYROIDECTOMY Right 02/19/15  ? ?Social History:  reports that she has never smoked. She has never used smokeless tobacco. She reports that she does not drink alcohol and does not use drugs. ? ?No Known Allergies ? ?Family History  ?Problem Relation Age of Onset  ? Pancreatic cancer Mother 16  ? Diabetes Father   ? Liver cancer Father 50  ? Diabetes Sister   ? Breast cancer Sister 36  ? Prostate cancer Paternal Uncle   ?     dx unknown age  ? Stomach cancer Maternal Grandfather   ?     dx 70s-80s  ? Prostate cancer Cousin   ?     dx 22s?  ? Prostate cancer Paternal Uncle   ?     dx unknown age; mets  ? ? ?Prior to Admission medications   ?Medication Sig Start Date End Date Taking? Authorizing Provider  ?acetaminophen (TYLENOL) 650 MG CR tablet Take 650 mg by mouth every 8 (eight) hours as needed for pain or fever.   Yes [provider]  ?diphenoxylate-atropine (LOMOTIL)  2.5-0.025 MG tablet Take 1-2 tablets by mouth 4 (four) times daily as needed for diarrhea or loose stools. 06/02/21  Yes [name], MD  ?guaiFENesin (MUCINEX) 600 MG 12 hr tablet Take 1 tablet (600 mg total) by mouth 2 (two) times daily for 10 days. ?Patient taking differently: Take 600 mg by mouth 2 (two) times daily as needed for cough. 07/03/21 07/13/21 Yes Antonieta Pert, MD  ?HYDROcodone-acetaminophen (NORCO/VICODIN) 5-325 MG tablet Take 1-2 tablets by mouth every 4 (four) hours as needed for up to 20 doses for moderate pain. 07/03/21  Yes Antonieta Pert, MD  ?lidocaine-prilocaine  (EMLA) cream Apply 1 application topically as needed. ?Patient taking differently: Apply 1 application. topically as needed (for port access). 05/26/21  Yes [name], MD  ?Liniments (BEN GAY EX) Apply 1 application. topically 2 (two) times daily as needed (pain).   Yes [provider]  ?megestrol (MEGACE ES) 625 MG/5ML suspension Take 5 mLs (625 mg total) by mouth daily. ?Patient taking differently: Take 625 mg by mouth daily as needed (appetite). 05/26/21  Yes [name], MD  ?metoprolol tartrate (LOPRESSOR) 25 MG tablet Take 0.5 tablets (12.5 mg total) by mouth 2 (two) times daily. 07/03/21 08/02/21 Yes Antonieta Pert, MD  ?ondansetron (ZOFRAN) 4 MG tablet Take 4 mg by mouth every 8 (eight) hours as needed for nausea or vomiting.   Yes [provider]  ?pantoprazole (PROTONIX) 40 MG tablet Take 1 tablet (40 mg total) by mouth daily. 07/04/21  Yes [name], MD  ?traMADol (ULTRAM) 50 MG tablet Take 50 mg by mouth every 6 (six) hours as needed for moderate pain or severe pain.   Yes [provider]  ?dexamethasone (DECADRON) 4 MG tablet Take 2 tablets at breakfast for 3 days, starting the day after cisplatin ?Patient not taking: Reported on 07/10/2021 08/16/20   Alla Feeling, NP  ?gabapentin (NEURONTIN) 100 MG capsule Take 1 capsule (100 mg total) by mouth 2 (two) times daily. 07/26/2021   [name], MD  ?polyethylene glycol powder (GLYCOLAX/MIRALAX) 17 GM/SCOOP powder Take 17 g by mouth 2 (two) times daily as needed. ?Patient not taking: Reported on 06/25/2021 11/01/20   Simmons-Robinson, Riki Sheer, MD  ?prochlorperazine (COMPAZINE) 10 MG tablet Take 1 tablet (10 mg total) by mouth every 6 (six) hours as needed for nausea or vomiting. ?Patient not taking: Reported on 06/25/2021 06/02/21   [name], MD  ? ? ?Physical Exam: ?Vitals:  ? 08/04/2021 1430 08/07/2021 1623 07/10/2021 1651 08/02/2021 1810  ?BP: (!) 114/94 103/76  119/60  ?Pulse: (!) 127 (!) 132 (!) 122 (!) 121  ?Resp: 19 18  20   ?Temp:   97.9 ?F (36.6 ?C)  98.4 ?F (36.9 ?C)  ?TempSrc:   Oral Oral  ?SpO2: 96% 100%  95%  ?Weight:    65.6 kg  ?Height:    5' 5"  (1.651 m)  ? ?General: 46 y.o. female resting in bed in mild distress ?Eyes: PERRL, ichteric sclera ?ENMT: Nares patent w/o discharge, orophaynx clear, dentition normal, ears w/o discharge/lesions/ulcers ?Neck: Supple, trachea midline ?Cardiovascular: tachy, +S1, S2, no m/g/r, equal pulses throughout ?Respiratory: CTABL, no w/r/r, normal WOB ?GI: BS+, mild distention, soft, global TTP, no organomegaly noted ?MSK: No e/c/c ?Neuro: A&O x 3, no focal deficits ? ?Data Reviewed: ? ?Na+ 131 ?BUN 26 ?Alk phos  434 ?AST  125 ?ALT  56 ?T bili 8.7 ?WBC  21.2 ?Hgb 8.2 ? ?Assessment and Plan: ?No notes have been filed under this hospital service. ?Service: Hospitalist ?Intractable  Pain secondary to cancer ?Intrahepatic cholangiocarcinoma w/ mets ?    - place in obs, tele ?    - she is s/p paracentesis and her abdominal pain is not much improved ?    - spoke with onco prior to her paracentesis, will follow while in hospital ?    - per onco, rec hospice at this point; had brief discussion with family while they were in clinic and hospice referral was placed ?    - at this point, she is still FULL code, but family is not looking for further workup of the elevated bilirubin/cancer ?    - dialudid 8m q4h PRN, oxy 5 mg q4h PRN ? ?Elevated alk phos ?Elevated LFTs ?Hyperbilirubinemia ?Jaundice ?    - see above ? ?HTN ?    - BP is stable, resume home regimen when confirmed ? ?Anemia of chronic disease ?    - Hgb is stable, no evidence of bleed ? ?Leukocytosis ?SIRS ?    - this is likely stress related ?    - will check procal; if elevated, can start abx ? ? Advance Care Planning:   Code Status: FULL ? ?Consults: Onco, IR ? ?Family Communication: w/ family at bedside ? ?Severity of Illness: ?The appropriate patient status for this patient is OBSERVATION. Observation status is judged to be reasonable and necessary in order to provide  the required intensity of service to ensure the patient's safety. The patient's presenting symptoms, physical exam findings, and initial radiographic and laboratory data in the context of their medical co

## 2021-07-11 NOTE — Progress Notes (Signed)
Pt niece, Romesa, at bedside to assist with admission process. Family/patient refusing need of video translator.   ?Pt very anxious and restless, rating pain at 9/10. Fentanyl administered per orders.   ?

## 2021-07-11 NOTE — Progress Notes (Signed)
Per [name] - pt will not be receiving treatment today. Patient received morphine for pain management and patient escorted over to the ER via wheelchair by NT. Tammi Sou, RN called report to Rudene Christians, [place] charge RN.  ?

## 2021-07-11 NOTE — Plan of Care (Signed)

## 2021-07-11 NOTE — Plan of Care (Signed)
New admission 1517: Intractable Pain  ?Medications per order.  ?Family at bedside.  ? ?Problem: Education: ?Goal: Knowledge of General Education information will improve ?Description: Including pain rating scale, medication(s)/side effects and non-pharmacologic comfort measures ?Outcome: Progressing ?  ?Problem: Health Behavior/Discharge Planning: ?Goal: Ability to manage health-related needs will improve ?Outcome: Progressing ?  ?Problem: Clinical Measurements: ?Goal: Ability to maintain clinical measurements within normal limits will improve ?Outcome: Progressing ?Goal: Will remain free from infection ?Outcome: Progressing ?Goal: Diagnostic test results will improve ?Outcome: Progressing ?Goal: Respiratory complications will improve ?Outcome: Progressing ?Goal: Cardiovascular complication will be avoided ?Outcome: Progressing ?  ?Problem: Activity: ?Goal: Risk for activity intolerance will decrease ?Outcome: Progressing ?  ?Problem: Nutrition: ?Goal: Adequate nutrition will be maintained ?Outcome: Progressing ?  ?Problem: Coping: ?Goal: Level of anxiety will decrease ?Outcome: Progressing ?  ?Problem: Elimination: ?Goal: Will not experience complications related to bowel motility ?Outcome: Progressing ?Goal: Will not experience complications related to urinary retention ?Outcome: Progressing ?  ?Problem: Pain Managment: ?Goal: General experience of comfort will improve ?Outcome: Progressing ?  ?Problem: Safety: ?Goal: Ability to remain free from injury will improve ?Outcome: Progressing ?  ?Problem: Skin Integrity: ?Goal: Risk for impaired skin integrity will decrease ?Outcome: Progressing ?  ?

## 2021-07-11 NOTE — ED Notes (Signed)
Pt back from paracentesis.

## 2021-07-11 NOTE — Procedures (Signed)
Ultrasound-guided diagnostic and therapeutic paracentesis performed yielding 2.8 liters of golden yellow fluid. No immediate complications. A portion of the fluid was sent to the lab for preordered studies. EBL none.  ? ?

## 2021-07-11 NOTE — ED Notes (Signed)
Pt transported for paracentesis  ?

## 2021-07-11 NOTE — ED Triage Notes (Signed)
Pt  came from [facility]. Pt's oncologist thought they might need a paracentesis. Pt c/o abd pain 8. ? ?Liver cx with mets. ? ? ?

## 2021-07-11 NOTE — Telephone Encounter (Signed)
CRITICAL VALUE STICKER ? ?CRITICAL VALUE: Total Bili = 9.2 ? ?RECEIVER (on-site recipient of call): Yetta Glassman, CMA ? ?DATE & TIME NOTIFIED: 07/12/2021 at 12:29pm ? ?MESSENGER (representative from lab): Lauren ? ?MD NOTIFIED: Burr Medico ? ?TIME OF NOTIFICATION: 08/02/2021 at 12:29pm ? ?RESPONSE: Notification provided to [name] and Tammi Sou, RN for follow-up with pt. ? ?

## 2021-07-11 NOTE — Progress Notes (Signed)
Got pt scheduled for [facility] Fluid clinic on 07/12/2021 '@12'$ :45pm which was the earliest appts at both hospitals.  Pt stated she could not wait until tomorrow and prefers going to the ED to be seen today for her paracentesis.  Contacted Erin, Infusion RN to let her know that the pt now will not get hydration just IV pain medication and will go to the ED after getting IV pain medication.  Called in report to [place], ED Camera operator.  Mendel Ryder stated they have a bunch of EMS pts that just arrived and does not have an open bed at this time for the pt.  Mendel Ryder stated that the pt would have to check-in through regular ED walk-in methods.  Notified Erin, Infusion RN of Lindsey's request.  Contacted [facility] to cancel the appt on 07/12/2021 since the pt is now going to the [facility] ED.  Verbal order given by [name] for Hospice Referral.  Placed order and sent request to [facility]. ?

## 2021-07-11 NOTE — ED Provider Notes (Addendum)
?[facility] DEPT ?Provider Note ? ? ?CSN: [id number] ?Arrival date & time: 07/19/2021  1231 ? ?  ? ?History ? ?Chief Complaint  ?Patient presents with  ?? Fluid in Abd  ? ? ?Crystal Gibbs is a 46 y.o. female. ? ?HPI ? ?  ? ?At the request of patient, patient's niece translated for [place].  I did offer utilization of hospital provided translation services which was declined. ? ?46 y.o. female with medical history significant of metastatic intrahepatic cholangiocarcinoma comes in with chief complaint of abdominal pain ? ?Patient came to the ER from cancer center.  She was complaining of severe pain and given morphine over there.  She was advised to come to the ER for further evaluation. ? ?Patient indicates that she was admitted to the hospital on 3-26 for her cancer related pain.  She was discharged with oxycodone and tramadol.  The pain at baseline is at 9 out of 10.  When she takes the medication, it goes down to 7 out of 10.  She is not having long-term significant relief.  There was a recommendation to see palliative nurse, and that appointment was supposed to be completed this week. ? ?Review of system is negative for any fevers or chills ? ?Patient is noted to be tachycardic.  She denies any chest pain, fevers or chills.  She has not had any peritoneal fluid drained in the past. ? ?Home Medications ?Prior to Admission medications   ?Medication Sig Start Date End Date Taking? Authorizing Provider  ?acetaminophen (TYLENOL) 650 MG CR tablet Take 650 mg by mouth every 8 (eight) hours as needed for pain or fever.   Yes [provider]  ?diphenoxylate-atropine (LOMOTIL) 2.5-0.025 MG tablet Take 1-2 tablets by mouth 4 (four) times daily as needed for diarrhea or loose stools. 06/02/21  Yes [name], MD  ?guaiFENesin (MUCINEX) 600 MG 12 hr tablet Take 1 tablet (600 mg total) by mouth 2 (two) times daily for 10 days. ?Patient taking differently: Take 600 mg by mouth 2 (two) times daily as  needed for cough. 07/03/21 07/13/21 Yes Antonieta Pert, MD  ?HYDROcodone-acetaminophen (NORCO/VICODIN) 5-325 MG tablet Take 1-2 tablets by mouth every 4 (four) hours as needed for up to 20 doses for moderate pain. 07/03/21  Yes Antonieta Pert, MD  ?lidocaine-prilocaine (EMLA) cream Apply 1 application topically as needed. ?Patient taking differently: Apply 1 application. topically as needed (for port access). 05/26/21  Yes [name], MD  ?Liniments (BEN GAY EX) Apply 1 application. topically 2 (two) times daily as needed (pain).   Yes [provider]  ?megestrol (MEGACE ES) 625 MG/5ML suspension Take 5 mLs (625 mg total) by mouth daily. ?Patient taking differently: Take 625 mg by mouth daily as needed (appetite). 05/26/21  Yes [name], MD  ?metoprolol tartrate (LOPRESSOR) 25 MG tablet Take 0.5 tablets (12.5 mg total) by mouth 2 (two) times daily. 07/03/21 08/02/21 Yes Antonieta Pert, MD  ?ondansetron (ZOFRAN) 4 MG tablet Take 4 mg by mouth every 8 (eight) hours as needed for nausea or vomiting.   Yes [provider]  ?pantoprazole (PROTONIX) 40 MG tablet Take 1 tablet (40 mg total) by mouth daily. 07/04/21  Yes [name], MD  ?traMADol (ULTRAM) 50 MG tablet Take 50 mg by mouth every 6 (six) hours as needed for moderate pain or severe pain.   Yes [provider]  ?dexamethasone (DECADRON) 4 MG tablet Take 2 tablets at breakfast for 3 days, starting the day after cisplatin ?Patient  not taking: Reported on 07/26/2021 08/16/20   Alla Feeling, NP  ?gabapentin (NEURONTIN) 100 MG capsule Take 1 capsule (100 mg total) by mouth 2 (two) times daily. 07/20/2021   [name], MD  ?polyethylene glycol powder (GLYCOLAX/MIRALAX) 17 GM/SCOOP powder Take 17 g by mouth 2 (two) times daily as needed. ?Patient not taking: Reported on 06/25/2021 11/01/20   Simmons-Robinson, Riki Sheer, MD  ?prochlorperazine (COMPAZINE) 10 MG tablet Take 1 tablet (10 mg total) by mouth every 6 (six) hours as needed for nausea or vomiting. ?Patient not  taking: Reported on 06/25/2021 06/02/21   [name], MD  ?   ? ?Allergies    ?Patient has no known allergies.   ? ?Review of Systems   ?Review of Systems  ?All other systems reviewed and are negative. ? ?Physical Exam ?Updated Vital Signs ?BP 103/76   Pulse (!) 122   Temp 97.9 ?F (36.6 ?C) (Oral)   Resp 18   SpO2 100%  ?Physical Exam ?Vitals and nursing note reviewed.  ?Constitutional:   ?   Appearance: She is well-developed. She is ill-appearing.  ?HENT:  ?   Head: Atraumatic.  ?Eyes:  ?   General: Scleral icterus present.  ?Cardiovascular:  ?   Rate and Rhythm: Tachycardia present.  ?Pulmonary:  ?   Effort: Pulmonary effort is normal.  ?Abdominal:  ?   Tenderness: There is abdominal tenderness. There is guarding.  ?   Comments: Generalized tenderness with positive fluid wave  ?Skin: ?   General: Skin is warm and dry.  ?Neurological:  ?   Mental Status: She is oriented to person, place, and time.  ? ? ?ED Results / Procedures / Treatments   ?Labs ?(all labs ordered are listed, but only abnormal results are displayed) ?Labs Reviewed  ?COMPREHENSIVE METABOLIC PANEL - Abnormal; Notable for the following components:  ?    Result Value  ? Sodium 131 (*)   ? CO2 21 (*)   ? BUN 26 (*)   ? Total Protein 6.1 (*)   ? Albumin 2.1 (*)   ? AST 125 (*)   ? ALT 56 (*)   ? Alkaline Phosphatase 434 (*)   ? Total Bilirubin 8.7 (*)   ? All other components within normal limits  ?CBC WITH DIFFERENTIAL/PLATELET - Abnormal; Notable for the following components:  ? WBC 21.2 (*)   ? RBC 2.80 (*)   ? Hemoglobin 8.2 (*)   ? HCT 25.7 (*)   ? RDW 25.0 (*)   ? Neutro Abs 18.0 (*)   ? Monocytes Absolute 1.7 (*)   ? All other components within normal limits  ?PROTIME-INR - Abnormal; Notable for the following components:  ? Prothrombin Time 18.0 (*)   ? INR 1.5 (*)   ? All other components within normal limits  ?LACTATE DEHYDROGENASE, PLEURAL OR PERITONEAL FLUID - Abnormal; Notable for the following components:  ? LD, Fluid 350 (*)   ? All  other components within normal limits  ?BODY FLUID CELL COUNT WITH DIFFERENTIAL - Abnormal; Notable for the following components:  ? Appearance, Fluid CLEAR (*)   ? Neutrophil Count, Fluid 41 (*)   ? Monocyte-Macrophage-Serous Fluid 37 (*)   ? All other components within normal limits  ?BODY FLUID CULTURE W GRAM STAIN  ?APTT  ?GLUCOSE, PLEURAL OR PERITONEAL FLUID  ?PATHOLOGIST SMEAR REVIEW  ? ? ?EKG ?None ? ?Radiology ?US Paracentesis ? ?Result Date: 07/28/2021 ?INDICATION: Patient with history of metastatic cholangiocarcinoma, ascites. Request received for diagnostic and  therapeutic paracentesis. EXAM: ULTRASOUND GUIDED DIAGNOSTIC AND THERAPEUTIC PARACENTESIS COMPARISON:  None MEDICATIONS: 10 mL 1% lidocaine COMPLICATIONS: None immediate. PROCEDURE: Informed written consent was obtained from patient via interpreter. Initial ultrasound scanning demonstrates a moderate amount of ascites within the right lower abdominal quadrant. The right lower abdomen was prepped and draped in the usual sterile fashion. 1% lidocaine was used for local anesthesia. Following this, a 19 gauge, 10-cm, Yueh catheter was introduced. An ultrasound image was saved for documentation purposes. The paracentesis was performed. The catheter was removed and a dressing was applied. The patient tolerated the procedure well without immediate post procedural complication. FINDINGS: A total of approximately 2.8 liters of golden yellow fluid was removed. Samples were sent to the laboratory as requested by the clinical team. IMPRESSION: Successful ultrasound-guided diagnostic and therapeutic paracentesis yielding 2.8 liters of peritoneal fluid. Read by: Rowe Robert, PA-C Electronically Signed   By: Jacqulynn Cadet M.D.   On: 07/10/2021 16:40   ? ?Procedures ?Marland KitchenCritical Care ?Performed by: Varney Biles, MD ?Authorized by: Varney Biles, MD  ? ?Critical care provider statement:  ?  Critical care time (minutes):  42 ?  Critical care was necessary to  treat or prevent imminent or life-threatening deterioration of the following conditions:  Hepatic failure ?  Critical care was time spent personally by me on the following activities:  Development of treatment plan wi

## 2021-07-11 NOTE — Progress Notes (Signed)
?[place]   ?Telephone:([phone]) [phone] [phone]   ?Clinic Follow up Note  ? ?Patient Care Team: ?[name], MD as PCP - General (Family Medicine) ?[name], MD as Consulting Physician (Oncology) ?[name], RN (Inactive) as Oncology Nurse Navigator ? ?Date of Service:  07/27/2021 ? ?CHIEF COMPLAINT: f/u of intrahepatic cholangiocarcinoma ? ?CURRENT THERAPY:  ?Supportive Care ? ?ASSESSMENT & PLAN:  ?Crystal Gibbs is a 46 y.o. female with  ? ?1. Abdominal Distention and Pain, Jaundice ?-secondary to cancer, previously stable and managed ?-worsened after recent trip to [place]. CT CAP in ED on 06/25/21 showed: progressive large hepatic masses, multifocal pulmonary metastases, and right perihilar nodal metastasis; new small to moderate abdominopelvic ascites, no frank peritoneal nodularity/omental caking. ?-she was given hydrocodone at discharge and has used most of it.  ?-she is also clinically jaundiced. Her tbili came back significantly elevated to 9.2. ?-after discussion, I referred the patient to the ED for urgent paracentesis  ? ?2. Goal of care discussion  ?-We again discussed the incurable nature of her cancer, and the overall poor prognosis. She has progressed on chemo and is now very weak. ?-I again discussed hospice care with them today. Given her pain and weakness, I strongly encouraged her to consider home hospice. They are interested; I will place a referral. ? ?3. 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 FO showed MSI stable. ?-MRI abdomen 07/10/20 shows at least 1 satellite lesion centrally in right lobe, and CT Chest 07/30/20 shows suspicious pulmonary nodules. EGD 08/18/20 did not show malignancy. ?-this is  most consistent with cholangiocarcinoma and is unfortunately not resectable. ?-she received gemcitabine/cisplatin with Durvalumab (based on TOPAZ1 trial) 5/9-10/6/22. She developed disease progression in the liver on MRI 01/08/21. ?-She switched to second line FOLFOX on 01/27/21, tolerating well overall ?-she met with Dr. Anselm Pancoast on 05/13/21 to discuss Y90. 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  ?-she received one cycle of FOLFIRI on 06/02/21.  ?-she travelled to the [place] following last chemo infusion, and she developed dyspnea, fever with chills, and worsening abdominal pain. She was seen in the ED on 06/24/21 and 06/28/21, subsequently admitted on second visit.  She was treated for pneumonia, and discharged home. ?-Unfortunately her cancer has progressed on recent CT scan, due to her poor performance status, she is not a candidate for more chemotherapy.  Her life expectancy is likely a few months or less.  ?-I recommend home hospice, she agreed today. ?  ?4. 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 ?-gabapentin prescribed 05/12/21, but they have had difficulty obtaining it. I will call in again today for them. ?  ?5. Social and Acupuncturist  ?-She lives in [place] with her husband. She is a [place] Citizen.  ?-She and her husband does not speak much [place]. They require interpretor, one of who they listed as a point of contact.   ?-She is not working. He has job, but not currently  working, as to help his wife.  ?-She has [employer] health insurance.  ?-she has established care with new PCP ?-has  been referred to SW and financial advocate for support and to possibly apply for Medicare/Medicaid.   ?  ?6. Genetic testing  ?-Given her age and family history of GI cancer with her mother, she is eligible for genetic testing.  ?-testing performed 08/19/20 was negative with two VUS's (RECQL4 and SDHA) ? ?  ?Plan: ?-I given her IV morphine 4 mg today in our infusion room for her severe pain, and sent her to ED for urgent paracentesis ?-I called in gabapentin ?-home hospice referral, I will remain as her attending when she is under hospice care. ? ? ? ?No problem-specific Assessment & Plan notes found for this encounter. ? ? ?SUMMARY OF ONCOLOGIC HISTORY: ?Oncology History Overview Note  ?Cancer Staging ?Intrahepatic cholangiocarcinoma ([place]) ?Staging form: Intrahepatic Bile Duct, AJCC 8th Edition ?- Clinical: Stage IB (cT1b, cN0, cM0) - Signed by [name], MD on 08/04/2020 ? ?  ?Intrahepatic cholangiocarcinoma ([place])  ?08/19/2019 Procedure  ? Upper Endoscopy by Dr [name]  ?IMPRESSION ?- Normal esophagus. ?- Z-line regular, 35 cm from the incisors. ?- Gastritis. Biopsied. ?- Normal incisura, antrum and pylorus. ?- Erythematous duodenopathy. Biopsied. ?- Normal second portion of the duodenum and third portion of the duodenum. ?- No evidence of primary malignancy site noted on this study. ?  ?07/10/2020 Imaging  ? US Abdomen 07/10/20 ?IMPRESSION: ?1.  No acute hepatobiliary findings. ?  ?2. Heterogeneous liver echotexture with 3 masslike areas as ?described measuring 4.3 cm, 5.4 cm and 6.4 cm respectively. ?Recommend CT abdomen/pelvis with intravenous contrast for further evaluation. ?  ?07/10/2020 Imaging  ? CT AP 07/10/20  ?IMPRESSION: ?1. There is a large masslike collection or mass in the central liver ?measuring at least 10.9 x 7.1 x 10.2 cm with 2 smaller adjacent ?satellite lesions/collections measuring 11 and 17 mm. These findings may represent a large abscess with satellite abscesses or a large neoplasm/malignancy.  Recommend MRI for further evaluation. ?2. Left renal cyst. ?3. Calcified atherosclerosis in the distal abdominal aorta is mild. ?4. Fibroid uterus. ?  ?07/10/2020 Imaging  ? MRI Abdomen 07/10/20  ?IMPRESSION: ?1. The large central hepatic mass does not demonstrate signal ?characteristics or enhancement typical of a hemangioma or abscess, and is suspicious for malignancy. There is at least 1 satellite lesion centrally in the right lobe. As there are no signs of ?underlying cirrhosis, primary considerations include peripheral ?cholangiocarcinoma and metastatic disease. Tissue sampling ?recommended. ?2. Mild intrahepatic biliary dilatation within the left hepatic ?lobe. Distortion of the hepatic vasculature without evidence of ?tumor thrombus. ?3. No extrahepatic metastatic disease or primary malignancy ?identified within the abdomen. ?  ?07/26/2020 Imaging  ? CT AP 07/26/20  ?IMPRESSION: ?1. Markedly poorly visualized and ill-defined 12 cm lesion within ?the liver. This lesion is better evaluated on MR abdomen 07/10/2020. ?2. Otherwise no acute intra-abdominal abnormality with markedly ?limited evaluation on this noncontrast study. ?3.  Aortic Atherosclerosis (ICD10-I70.0). ?  ?07/26/2020 Initial Diagnosis  ? FINAL MICROSCOPIC DIAGNOSIS: 07/26/20 ? ?A. LIVER MASS, LEFT, NEEDLE CORE BIOPSY:  ?- Adenocarcinoma.  ? ?ADDENDUM: The adenocarcinoma is positive with immunohistochemistry for cytokeratin 7 and PAX 8.  The carcinoma is negative with cytokeratin 20, CDX 2, estrogen receptor, GATA3, GCDFP, monoclonal and polyclonal CEA, cytokeratin 5/6, TTF-1, Napsin A, HepPar1, arginase 1 and WT1.  The morphology and immunophenotype are consistent with adenocarcinoma.  The differential for possible primary sites is broad including  kidney although the morphology is not typical for more common types of renal cell carcinoma.  Pancreaticobiliary and gynecologic cannot be ruled out.  ?  ?07/30/2020 Imaging  ? CT Chest 07/30/20  ?IMPRESSION: ?1.  Pulmonary nodules are highly worrisome for metastatic disease. ?2. Hepatic masses are consistent with biopsy-proven adenocarcinoma and better evaluated on MR abdomen 07/10/2020. ?  ?08/04/2020 Initial Diagnos

## 2021-07-11 NOTE — Progress Notes (Signed)
Manufacturing engineer [facility]) Hospital Liaison Note ? ?This is a pending hospice patient with [facility]. We will follow for coordination of hospice at discharge.  ? ?Please call with any questions or concerns. Thank you ? ?Roselee Nova, LCSW ?[facility] Liaison ?[phone] ?

## 2021-07-12 ENCOUNTER — Inpatient Hospital Stay (HOSPITAL_COMMUNITY): Admission: RE | Admit: 2021-07-12 | Payer: 59 | Source: Ambulatory Visit

## 2021-07-12 DIAGNOSIS — D630 Anemia in neoplastic disease: Secondary | ICD-10-CM | POA: Diagnosis present

## 2021-07-12 DIAGNOSIS — G9341 Metabolic encephalopathy: Secondary | ICD-10-CM | POA: Diagnosis present

## 2021-07-12 DIAGNOSIS — E875 Hyperkalemia: Secondary | ICD-10-CM | POA: Diagnosis present

## 2021-07-12 DIAGNOSIS — Z800 Family history of malignant neoplasm of digestive organs: Secondary | ICD-10-CM | POA: Diagnosis not present

## 2021-07-12 DIAGNOSIS — Z515 Encounter for palliative care: Secondary | ICD-10-CM

## 2021-07-12 DIAGNOSIS — G893 Neoplasm related pain (acute) (chronic): Secondary | ICD-10-CM | POA: Diagnosis present

## 2021-07-12 DIAGNOSIS — Z9841 Cataract extraction status, right eye: Secondary | ICD-10-CM | POA: Diagnosis not present

## 2021-07-12 DIAGNOSIS — D72829 Elevated white blood cell count, unspecified: Secondary | ICD-10-CM | POA: Diagnosis present

## 2021-07-12 DIAGNOSIS — N179 Acute kidney failure, unspecified: Secondary | ICD-10-CM | POA: Diagnosis not present

## 2021-07-12 DIAGNOSIS — Z66 Do not resuscitate: Secondary | ICD-10-CM | POA: Diagnosis not present

## 2021-07-12 DIAGNOSIS — C221 Intrahepatic bile duct carcinoma: Secondary | ICD-10-CM | POA: Diagnosis present

## 2021-07-12 DIAGNOSIS — R52 Pain, unspecified: Secondary | ICD-10-CM | POA: Diagnosis not present

## 2021-07-12 DIAGNOSIS — R180 Malignant ascites: Secondary | ICD-10-CM

## 2021-07-12 DIAGNOSIS — Z751 Person awaiting admission to adequate facility elsewhere: Secondary | ICD-10-CM | POA: Diagnosis not present

## 2021-07-12 DIAGNOSIS — C799 Secondary malignant neoplasm of unspecified site: Secondary | ICD-10-CM | POA: Diagnosis present

## 2021-07-12 DIAGNOSIS — Z833 Family history of diabetes mellitus: Secondary | ICD-10-CM | POA: Diagnosis not present

## 2021-07-12 DIAGNOSIS — R17 Unspecified jaundice: Secondary | ICD-10-CM | POA: Diagnosis present

## 2021-07-12 DIAGNOSIS — Z803 Family history of malignant neoplasm of breast: Secondary | ICD-10-CM | POA: Diagnosis not present

## 2021-07-12 DIAGNOSIS — Z79899 Other long term (current) drug therapy: Secondary | ICD-10-CM | POA: Diagnosis not present

## 2021-07-12 DIAGNOSIS — Z8042 Family history of malignant neoplasm of prostate: Secondary | ICD-10-CM | POA: Diagnosis not present

## 2021-07-12 DIAGNOSIS — K7290 Hepatic failure, unspecified without coma: Secondary | ICD-10-CM | POA: Diagnosis present

## 2021-07-12 DIAGNOSIS — I10 Essential (primary) hypertension: Secondary | ICD-10-CM | POA: Diagnosis present

## 2021-07-12 DIAGNOSIS — R6510 Systemic inflammatory response syndrome (SIRS) of non-infectious origin without acute organ dysfunction: Secondary | ICD-10-CM | POA: Diagnosis present

## 2021-07-12 DIAGNOSIS — Z9842 Cataract extraction status, left eye: Secondary | ICD-10-CM | POA: Diagnosis not present

## 2021-07-12 DIAGNOSIS — Z7189 Other specified counseling: Secondary | ICD-10-CM

## 2021-07-12 DIAGNOSIS — R627 Adult failure to thrive: Secondary | ICD-10-CM | POA: Diagnosis present

## 2021-07-12 DIAGNOSIS — E871 Hypo-osmolality and hyponatremia: Secondary | ICD-10-CM | POA: Diagnosis present

## 2021-07-12 LAB — CBC
HCT: 27.2 % — ABNORMAL LOW (ref 36.0–46.0)
Hemoglobin: 8.7 g/dL — ABNORMAL LOW (ref 12.0–15.0)
MCH: 29.6 pg (ref 26.0–34.0)
MCHC: 32 g/dL (ref 30.0–36.0)
MCV: 92.5 fL (ref 80.0–100.0)
Platelets: 157 10*3/uL (ref 150–400)
RBC: 2.94 MIL/uL — ABNORMAL LOW (ref 3.87–5.11)
RDW: 26.4 % — ABNORMAL HIGH (ref 11.5–15.5)
WBC: 18.3 10*3/uL — ABNORMAL HIGH (ref 4.0–10.5)
nRBC: 0.3 % — ABNORMAL HIGH (ref 0.0–0.2)

## 2021-07-12 LAB — COMPREHENSIVE METABOLIC PANEL
ALT: 58 U/L — ABNORMAL HIGH (ref 0–44)
AST: 147 U/L — ABNORMAL HIGH (ref 15–41)
Albumin: 2 g/dL — ABNORMAL LOW (ref 3.5–5.0)
Alkaline Phosphatase: 382 U/L — ABNORMAL HIGH (ref 38–126)
Anion gap: 10 (ref 5–15)
BUN: 32 mg/dL — ABNORMAL HIGH (ref 6–20)
CO2: 19 mmol/L — ABNORMAL LOW (ref 22–32)
Calcium: 8.8 mg/dL — ABNORMAL LOW (ref 8.9–10.3)
Chloride: 102 mmol/L (ref 98–111)
Creatinine, Ser: 0.84 mg/dL (ref 0.44–1.00)
GFR, Estimated: >60 mL/min (ref >60)
Glucose, Bld: 68 mg/dL — ABNORMAL LOW (ref 70–99)
Potassium: 6 mmol/L — ABNORMAL HIGH (ref 3.5–5.1)
Sodium: 131 mmol/L — ABNORMAL LOW (ref 135–145)
Total Bilirubin: 9.1 mg/dL — ABNORMAL HIGH (ref 0.3–1.2)
Total Protein: 5.7 g/dL — ABNORMAL LOW (ref 6.5–8.1)

## 2021-07-12 LAB — CANCER ANTIGEN 19-9: CA 19-9: 372 U/mL — ABNORMAL HIGH (ref 0–35)

## 2021-07-12 LAB — POTASSIUM: Potassium: 5.7 mmol/L — ABNORMAL HIGH (ref 3.5–5.1)

## 2021-07-12 MED ORDER — GABAPENTIN 100 MG PO CAPS
100.0000 mg | ORAL_CAPSULE | Freq: Two times a day (BID) | ORAL | Status: DC
Start: 2021-07-12 — End: 2021-07-13
  Administered 2021-07-12 (×2): 100 mg via ORAL
  Filled 2021-07-12 (×2): qty 1

## 2021-07-12 MED ORDER — POLYETHYLENE GLYCOL 3350 17 G PO PACK: 17.0000 g | PACK | Freq: Every day | ORAL | Status: DC | PRN

## 2021-07-12 MED ORDER — METOPROLOL TARTRATE 12.5 MG HALF TABLET
12.5000 mg | ORAL_TABLET | Freq: Two times a day (BID) | ORAL | Status: DC
  Administered 2021-07-12 (×2): 12.5 mg via ORAL
  Filled 2021-07-12 (×2): qty 1

## 2021-07-12 MED ORDER — SENNOSIDES-DOCUSATE SODIUM 8.6-50 MG PO TABS
1.0000 | ORAL_TABLET | Freq: Two times a day (BID) | ORAL | Status: DC
  Administered 2021-07-12: 1 via ORAL
  Filled 2021-07-12: qty 1

## 2021-07-12 MED ORDER — ONDANSETRON HCL 4 MG PO TABS: 4.0000 mg | ORAL_TABLET | Freq: Three times a day (TID) | ORAL | Status: DC | PRN

## 2021-07-12 MED ORDER — HYDROCORTISONE 1 % EX CREA
TOPICAL_CREAM | CUTANEOUS | Status: DC | PRN
  Filled 2021-07-12: qty 28

## 2021-07-12 MED ORDER — ENOXAPARIN SODIUM 40 MG/0.4ML IJ SOSY
40.0000 mg | PREFILLED_SYRINGE | INTRAMUSCULAR | Status: DC
  Administered 2021-07-12: 40 mg via SUBCUTANEOUS
  Filled 2021-07-12: qty 0.4

## 2021-07-12 MED ORDER — DIPHENHYDRAMINE HCL 50 MG/ML IJ SOLN
6.2500 mg | Freq: Once | INTRAMUSCULAR | Status: AC
  Administered 2021-07-12: 6.5 mg via INTRAVENOUS
  Filled 2021-07-12: qty 1

## 2021-07-12 MED ORDER — DIPHENOXYLATE-ATROPINE 2.5-0.025 MG PO TABS: 1.0000 | ORAL_TABLET | Freq: Four times a day (QID) | ORAL | Status: DC | PRN

## 2021-07-12 MED ORDER — SODIUM ZIRCONIUM CYCLOSILICATE 10 G PO PACK
10.0000 g | PACK | Freq: Once | ORAL | Status: AC
  Administered 2021-07-12: 10 g via ORAL
  Filled 2021-07-12: qty 1

## 2021-07-12 MED ORDER — MORPHINE SULFATE ER 15 MG PO TBCR
15.0000 mg | EXTENDED_RELEASE_TABLET | Freq: Two times a day (BID) | ORAL | Status: DC
  Administered 2021-07-12: 15 mg via ORAL
  Filled 2021-07-12: qty 1

## 2021-07-12 NOTE — Assessment & Plan Note (Addendum)
H&H remains stable had noted to be stable ?-Now focus on comfort ?

## 2021-07-12 NOTE — Assessment & Plan Note (Addendum)
Onco note: Unfortunately her cancer has progressed on recent CT scan, due to her poor performance status,  She is not a candidate for more chemotherapy.  Her life expectancy is likely few months or less.  Recommend home hospice. ?Palliative care was consulted ?Pt is s/p paracentesis, reports abdominal pain has not improved. ?Pt now not eating or taking meds. Prognosis now <2 weeks and would be appropriate for residential hospice ?Met with pt and husband at bedside. Wishes confirmed to be DNR/DNI with focus primarily on comfort at this time ?

## 2021-07-12 NOTE — Assessment & Plan Note (Addendum)
Had been continued on metoprolol 12.5 mg every 12hr ?Now focus on comfort ?

## 2021-07-12 NOTE — Consult Note (Signed)
? ?Palliative Care Consult Note  ?                                ?Date: 07/12/2021  ? ?Patient Name: Crystal Gibbs  ?DOB: 08-18-1975  MRN: [id number]  Age / Sex: 46 y.o., female  ?PCP: Crystal Foster, MD ?Referring Physician: Shawna Clamp, MD ? ?Reason for Consultation: Establishing goals of care, Non pain symptom management, and Pain control ? ?HPI/Patient Profile: Palliative Care consult requested for goals of care discussion and symptom management in this 46 y.o. female  with past medical history of hypertension and metastatic cholangiocarcinoma.  She was admitted on 08/02/2021 after being seen at [facility] by Crystal Gibbs,  found to have elevated bilirubin and ascites requiring urgent paracentesis in ED work-up.  Paracentesis completed yielded 2.8 L.  Alk phos 434, albumin 2.0, AST 147, ALT 58, total bili 9.1, potassium 6.0. ? ? ?Past Medical History:  ?Diagnosis Date  ? Cancer [facility])   ? Family history of breast cancer 08/23/2020  ? Family history of pancreatic cancer 08/23/2020  ? Family history of prostate cancer 08/23/2020  ? Family history of stomach cancer 08/23/2020  ? Fibroid   ? 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 Hypertension   ? ? ? ?Subjective:  ? ?This NP Crystal Gibbs reviewed medical records, received report from team, assessed the patient and then met at the patient's bedside with Crystal Soho, RN, Crystal Gibbs, her sister, and niece Crystal Gibbs (who speaks fluent English) to discuss diagnosis, prognosis, GOC, EOL wishes disposition and options. ? ?Patient appears uncomfortable in bed.  Observed frequently changing positions with occasional whimpering and moaning.  Family reports this has become more habitual and does not feel like it is related specifically to her discomfort although it does increase during that time.  She is alert and oriented x3 to questions and conversation. ?  ?Concept of Palliative Care was introduced as specialized  medical care for people and their families living with serious illness.  It focuses on providing relief from the symptoms and stress of a serious illness.  The goal is to improve quality of life for both the patient and the family. Values and goals of care important to patient and family were attempted to be elicited. ? ?Patient lives in the home with her husband.  They do not have any children.  Her sister,Crystal Gibbs has been staying with her to provide additional support in the home due to her health decline. ? ?Prior to admission patient was ambulatory in the home with standby assistance due to gait instability and weakness.  Appetite greatly diminished to sips and bites.  Noticeable increase in abdominal girth and pain.  Family reports patient has not been very active in the home and sleeping more. ? ?Created space and opportunity for patient and family to explore state of health prior to admission, thoughts, and feelings.  ? ?We discussed Her current illness and what it means in the larger context of Her on-going co-morbidities. Natural disease trajectory and expectations were discussed. ? ?Patient and family verbalized understanding of current illness, trajectory, and are realistic in their understanding regarding poor prognosis.  Although they were remaining hopeful for additional time they are aware that she continues to rapidly decline.  Emotional support provided. ? ?I discussed the importance of continued conversation with family and their medical providers regarding overall plan of care and treatment options, ensuring  decisions are within the context of the patients values and GOCs. ? ?Questions and concerns were addressed. The family was encouraged to call with questions or concerns.  PMT will continue to support holistically as needed. ? ?Objective:  ? ?Primary Diagnoses: ?Present on Admission: ? Intractable pain ? Intrahepatic cholangiocarcinoma ([place]) ? HTN (hypertension) ? Anemia of chronic  disease ? ? ?Scheduled Meds: ? enoxaparin (LOVENOX) injection  40 mg Subcutaneous Q24H  ? gabapentin  100 mg Oral BID  ? metoprolol tartrate  12.5 mg Oral BID  ? morphine  15 mg Oral Q12H  ? pantoprazole  40 mg Oral Daily  ? senna-docusate  1 tablet Oral BID  ? ? ?Continuous Infusions: ? ? ?PRN Meds: ?acetaminophen **OR** acetaminophen, diphenoxylate-atropine, HYDROmorphone (DILAUDID) injection, megestrol, ondansetron, oxyCODONE, polyethylene glycol, prochlorperazine ? ?No Known Allergies ? ?Review of Systems  ?Constitutional:  Positive for appetite change.  ?Gastrointestinal:  Positive for abdominal distention and abdominal pain.  ?Musculoskeletal:  Positive for arthralgias.  ?Neurological:  Positive for weakness.  ?Unless otherwise noted, a complete review of systems is negative. ? ?Physical Exam ?General: NAD, frail, cachectic, chronically-ill appearing ?Cardiovascular: Tachycardic ?Pulmonary: Diminished bilaterally  ?Abdomen: soft, tender, distended, + bowel sounds ?Extremities: Bilateral lower extremity edema, no joint deformities ?Skin: no rashes, warm and dry, sclera icterus ?Neurological: AAOx3, with interpretation.   ? ?Vital Signs:  ?BP 105/79 (BP Location: Left Arm)   Pulse (!) 124   Temp 98.3 ?F (36.8 ?C) (Oral)   Resp 16   Ht _0  (1.651 m) Comment: Simultaneous filing. User may not have seen previous data.  Wt 65.6 kg Comment: Simultaneous filing. User may not have seen previous data.  SpO2 97%   BMI 24.07 kg/m?  ?Pain Scale: 0-10 ?  ?Pain Score: 7  ? ?SpO2: SpO2: 97 % ?O2 Device:SpO2: 97 % ?O2 Flow Rate: .  ? ?IO: Intake/output summary: No intake or output data in the 24 hours ending 07/12/21 1634 ? ?LBM: Last BM Date : 08/04/2021 ?Baseline Weight: Weight: 65.6 kg (Simultaneous filing. User may not have seen previous data.) ?Most recent weight: Weight: 65.6 kg (Simultaneous filing. User may not have seen previous data.)     ? ?Palliative Assessment/Data: PPS 20% ? ? ?Advanced Care Planning:   ? ?Primary Decision Maker: ?PATIENT-in the setting of no advanced directives family is aware patient's husband is her legal medical decision maker. ? ?Code Status/Advance Care Planning: ?Full code ? ?A discussion was had today regarding advanced directives. Concepts specific to code status, artifical feeding and hydration, continued IV antibiotics and rehospitalization was had. ? ?The difference between a aggressive medical intervention path and a palliative comfort care path was discussed.  ? ?Patient is clear she would not want any artificial feedings or life-sustaining measures.  Family speaks to the Muslim religion and currently in Ramadan. ? ?I discussed at length current full CODE STATUS with consideration of her current illness and poor prognosis.  Patient and family expressed they would like to further discuss CODE STATUS however is leaning towards full code.  They are able to verbalize they would not want any life-sustaining measures or chest compressions however are not prepared to place this in writing.  Niece is asked for my phone number as family will have further discussions tonight and make decisions.  If we do not speak today they will be prepared to make decisions tomorrow.  Acknowledge the request also expressing concerns given patient's tenuous state she is at high risk of sudden decompensation and/or death.  Family verbalized understanding. ? ?Hospice services outpatient were explained and offered. Patient and family verbalized their understanding and awareness of both hospice's goals and philosophy of care.  Niece shares their experience with her father who passed away from pancreatic cancer and was under hospice care.  Patient states she would like to be at home in her own environment amongst family and friends if at all possible and spend what time she has lived.  They are requesting outpatient hospice in the home.  She is currently active with [facility] who is following for disposition  needs.  Family reports the patient returns home they will require transportation, wheelchair, walker, and hospital bed. ? ? ?Assessment & Plan:  ? ?SUMMARY OF RECOMMENDATIONS   ?Full Code-as confirmed by patient and family.  [employer]

## 2021-07-12 NOTE — Hospital Course (Signed)
This 46 years old female with PMH significant for metastatic cholangiocarcinoma, hypertension, presented in the ED with abdominal pain. She was recently hospitalized for community-acquired pneumonia and she was discharged on 07/03/2021.  Patient has developed intractable abdominal pain, patient has followed up with oncologist,  Labs were drawn found to have elevated bilirubin and she was sent to the ED for urgent paracentesis   Patient underwent successful paracentesis. ?Recent oncologist note.  Unfortunately her cancer has progressed on recent CT scan, due to her poor performance status,  She is not a candidate for more chemotherapy.  Her life expectancy is likely a few months or less.  Recommend home hospice. ?Patient is admitted for pain control,  they are not requesting work-up for hyperbilirubinemia.  Palliative care is consulted to discuss goals of care. ?

## 2021-07-12 NOTE — Assessment & Plan Note (Addendum)
Lokelma given, but remained elevated ?Given one dose of IV insulin with D50 and one amp of bicarb ?Later transitioned to comfort measures ?

## 2021-07-12 NOTE — Progress Notes (Signed)
Manufacturing engineer [facility]) Hospital Liaison Note ?  ?This is a pending hospice patient with [facility]. We will follow for coordination of hospice at discharge.  ?  ?Please call with any questions or concerns. Thank you ?  ?Roselee Nova, LCSW ?[facility] Liaison ?[phone] ?

## 2021-07-12 NOTE — Assessment & Plan Note (Addendum)
Continue adequate pain control with Dilaudid, oxycodone. ?Palliative care consulted  ?Cont with analgesia as needed for comfort ?

## 2021-07-12 NOTE — Assessment & Plan Note (Addendum)
Likely stress related.  Procalcitonin 2.56, lactic acid normal. ?Patient transitioned to comfort measures ?

## 2021-07-12 NOTE — Progress Notes (Signed)
?Progress Note ? ? ?Patient: Crystal Gibbs KGY:[id number] DOB: [birth year] DOA: 08/04/2021     0 ?DOS: the patient was seen and examined on 07/12/2021 ?  ?Brief hospital course: ?This 46 years old female with PMH significant for metastatic cholangiocarcinoma, hypertension, presented in the ED with abdominal pain. She was recently hospitalized for community-acquired pneumonia and she was discharged on 07/03/2021.  Patient has developed intractable abdominal pain, patient has followed up with oncologist,  Labs were drawn found to have elevated bilirubin and she was sent to the ED for urgent paracentesis   Patient underwent successful paracentesis. ?Recent oncologist note.  Unfortunately her cancer has progressed on recent CT scan, due to her poor performance status,  She is not a candidate for more chemotherapy.  Her life expectancy is likely a few months or less.  Recommend home hospice. ?Patient is admitted for pain control,  they are not requesting work-up for hyperbilirubinemia.  Palliative care is consulted to discuss goals of care. ? ?Assessment and Plan: ?* Intractable pain ?Continue adequate pain control with Dilaudid, oxycodone. ?Palliative care consulted to discuss goals of care and pain control. ? ?HTN (hypertension) ?Continue metoprolol 12.5 mg every 12hr ? ?Intrahepatic cholangiocarcinoma ([place]) ?Onco note: Unfortunately her cancer has progressed on recent CT scan, due to her poor performance status,  She is not a candidate for more chemotherapy.  Her life expectancy is likely few months or less.  Recommend home hospice. ?Palliative care is consulted to discuss goals of care. ?S/p paracentesis, reports abdominal pain has not improved. ? ?Hyperkalemia ?Lokelma given.  Recheck potassium ? ?SIRS (systemic inflammatory response syndrome) ([facility]) ?Likely stress related.  Procalcitonin 2.56, lactic acid normal. ? ?Leukocytosis ?Likely stress related.  Follow-up lactic acid and recheck WBC ? ?Anemia of chronic disease ?H&H  remains stable.  No evidence of bleeding. ? ? ?Subjective: Patient was seen and examined at bedside.  Overnight events noted.   ?Patient continues to report pain,  states not improved. ? ?Physical Exam: ?Vitals:  ? 07/24/2021 2254 07/12/21 0234 07/12/21 0458 07/12/21 0911  ?BP: (!) 102/56 90/63 122/86 101/75  ?Pulse: (!) 114 (!) 113 (!) 132 (!) 122  ?Resp: '16 16 16 16  '$ ?Temp: 99 ?F (37.2 ?C) 97.7 ?F (36.5 ?C) 97.9 ?F (36.6 ?C) 98.4 ?F (36.9 ?C)  ?TempSrc: Oral Oral Oral Oral  ?SpO2: 100% 97% 94% 96%  ?Weight:      ?Height:      ? ?General exam: Appears comfortable, not in any acute distress, anxious, deconditioned. ?Respiratory system: CTA bilaterally, no wheezing, no crackles, respiratory effort normal. ?Cardiovascular system: S1-S2 heard, regular rate and rhythm, no murmur. ?Gastrointestinal system: Abdomen soft, slightly distended, tender, BS+ ?Central nervous system: Alert and oriented x3, no focal neurological deficits. ?Extremities: No edema, no cyanosis, no clubbing. ?Psychiatry: Mood, insight, judgment normal. ? ?Data Reviewed: ?I have Reviewed nursing notes, Vitals, and Lab results since pt's last encounter. Pertinent lab results CBC,CMP ?I have ordered test including CBC CMP ?I have reviewed the last note from hospitalist,  ?I have discussed pt's care plan and test results with patient and husband.  ? ?Family Communication: Husband at bedside ? ?Disposition: ?Status is: Observation ?The patient remains OBS appropriate and will d/c before 2 midnights. ? ?Patient with metastatic cholangiocarcinoma admitted with intractable pain.  Oncologist recommended home hospice.  Palliative care is consulted to discuss goals of care. ? ? ? Planned Discharge Destination: Home HOSPICE ? ? ? ?Time spent: 50 minutes ? ?Author: ?Shawna Clamp, MD ?07/12/2021 12:13 PM ? ?  For on call review [email or web address].  ?

## 2021-07-12 NOTE — Assessment & Plan Note (Addendum)
Likely stress related.  ?Focus on comfort ?

## 2021-07-13 ENCOUNTER — Inpatient Hospital Stay: Payer: 59

## 2021-07-13 ENCOUNTER — Other Ambulatory Visit: Payer: Self-pay

## 2021-07-13 DIAGNOSIS — Z66 Do not resuscitate: Secondary | ICD-10-CM | POA: Diagnosis not present

## 2021-07-13 DIAGNOSIS — C221 Intrahepatic bile duct carcinoma: Secondary | ICD-10-CM | POA: Diagnosis not present

## 2021-07-13 DIAGNOSIS — R52 Pain, unspecified: Secondary | ICD-10-CM | POA: Diagnosis not present

## 2021-07-13 DIAGNOSIS — G893 Neoplasm related pain (acute) (chronic): Secondary | ICD-10-CM | POA: Diagnosis not present

## 2021-07-13 DIAGNOSIS — R180 Malignant ascites: Secondary | ICD-10-CM | POA: Diagnosis not present

## 2021-07-13 LAB — COMPREHENSIVE METABOLIC PANEL
ALT: 79 U/L — ABNORMAL HIGH (ref 0–44)
AST: 296 U/L — ABNORMAL HIGH (ref 15–41)
Albumin: 2.1 g/dL — ABNORMAL LOW (ref 3.5–5.0)
Alkaline Phosphatase: 339 U/L — ABNORMAL HIGH (ref 38–126)
Anion gap: 12 (ref 5–15)
BUN: 46 mg/dL — ABNORMAL HIGH (ref 6–20)
CO2: 18 mmol/L — ABNORMAL LOW (ref 22–32)
Calcium: 8.6 mg/dL — ABNORMAL LOW (ref 8.9–10.3)
Chloride: 103 mmol/L (ref 98–111)
Creatinine, Ser: 1.33 mg/dL — ABNORMAL HIGH (ref 0.44–1.00)
GFR, Estimated: 51 mL/min — ABNORMAL LOW (ref >60)
Glucose, Bld: 79 mg/dL (ref 70–99)
Potassium: 6.1 mmol/L — ABNORMAL HIGH (ref 3.5–5.1)
Sodium: 133 mmol/L — ABNORMAL LOW (ref 135–145)
Total Bilirubin: 11.1 mg/dL — ABNORMAL HIGH (ref 0.3–1.2)
Total Protein: 6.1 g/dL — ABNORMAL LOW (ref 6.5–8.1)

## 2021-07-13 LAB — CBC
HCT: 25.5 % — ABNORMAL LOW (ref 36.0–46.0)
Hemoglobin: 8.1 g/dL — ABNORMAL LOW (ref 12.0–15.0)
MCH: 30.1 pg (ref 26.0–34.0)
MCHC: 31.8 g/dL (ref 30.0–36.0)
MCV: 94.8 fL (ref 80.0–100.0)
Platelets: 154 10*3/uL (ref 150–400)
RBC: 2.69 MIL/uL — ABNORMAL LOW (ref 3.87–5.11)
RDW: 27 % — ABNORMAL HIGH (ref 11.5–15.5)
WBC: 22.7 10*3/uL — ABNORMAL HIGH (ref 4.0–10.5)
nRBC: 0.4 % — ABNORMAL HIGH (ref 0.0–0.2)

## 2021-07-13 LAB — MAGNESIUM: Magnesium: 2.2 mg/dL (ref 1.7–2.4)

## 2021-07-13 LAB — CYTOLOGY - NON PAP

## 2021-07-13 LAB — PHOSPHORUS: Phosphorus: 6.6 mg/dL — ABNORMAL HIGH (ref 2.5–4.6)

## 2021-07-13 MED ORDER — DEXTROSE 50 % IV SOLN
1.0000 | Freq: Once | INTRAVENOUS | Status: AC
  Administered 2021-07-13: 50 mL via INTRAVENOUS
  Filled 2021-07-13: qty 50

## 2021-07-13 MED ORDER — HYDROMORPHONE HCL 1 MG/ML IJ SOLN
1.0000 mg | INTRAMUSCULAR | Status: DC | PRN
  Administered 2021-07-13 – 2021-07-14 (×3): 1 mg via INTRAVENOUS
  Filled 2021-07-13 (×3): qty 1

## 2021-07-13 MED ORDER — ONDANSETRON HCL 4 MG/2ML IJ SOLN: 4.0000 mg | Freq: Four times a day (QID) | INTRAMUSCULAR | Status: DC | PRN

## 2021-07-13 MED ORDER — POLYVINYL ALCOHOL 1.4 % OP SOLN
1.0000 [drp] | Freq: Four times a day (QID) | OPHTHALMIC | Status: DC | PRN
  Filled 2021-07-13: qty 15

## 2021-07-13 MED ORDER — SODIUM CHLORIDE 0.9 % IV SOLN: INTRAVENOUS | Status: DC

## 2021-07-13 MED ORDER — LORAZEPAM 2 MG/ML IJ SOLN: 1.0000 mg | INTRAMUSCULAR | Status: DC | PRN

## 2021-07-13 MED ORDER — BIOTENE DRY MOUTH MT LIQD: 15.0000 mL | OROMUCOSAL | Status: DC | PRN

## 2021-07-13 MED ORDER — SODIUM CHLORIDE 0.9% FLUSH: 10.0000 mL | INTRAVENOUS | Status: DC | PRN

## 2021-07-13 MED ORDER — SODIUM BICARBONATE 8.4 % IV SOLN
50.0000 meq | Freq: Once | INTRAVENOUS | Status: AC
  Administered 2021-07-13: 50 meq via INTRAVENOUS
  Filled 2021-07-13: qty 50

## 2021-07-13 MED ORDER — LORAZEPAM 2 MG/ML PO CONC: 1.0000 mg | ORAL | Status: DC | PRN

## 2021-07-13 MED ORDER — SENNOSIDES-DOCUSATE SODIUM 8.6-50 MG PO TABS: 1.0000 | ORAL_TABLET | Freq: Two times a day (BID) | ORAL | Status: DC | PRN

## 2021-07-13 MED ORDER — SODIUM ZIRCONIUM CYCLOSILICATE 5 G PO PACK
5.0000 g | PACK | Freq: Two times a day (BID) | ORAL | Status: DC
  Filled 2021-07-13: qty 1

## 2021-07-13 MED ORDER — SODIUM CHLORIDE 0.9% FLUSH
10.0000 mL | Freq: Two times a day (BID) | INTRAVENOUS | Status: DC
  Administered 2021-07-13: 30 mL

## 2021-07-13 MED ORDER — LORAZEPAM 2 MG/ML PO CONC: 0.5000 mg | ORAL | Status: DC | PRN

## 2021-07-13 MED ORDER — OXYCODONE HCL 20 MG/ML PO CONC: 5.0000 mg | ORAL | Status: DC | PRN

## 2021-07-13 MED ORDER — INSULIN ASPART 100 UNIT/ML IV SOLN
5.0000 [IU] | Freq: Once | INTRAVENOUS | Status: AC
  Administered 2021-07-13: 5 [IU] via INTRAVENOUS

## 2021-07-13 MED ORDER — ORAL CARE MOUTH RINSE
15.0000 mL | Freq: Two times a day (BID) | OROMUCOSAL | Status: DC
  Administered 2021-07-13: 15 mL via OROMUCOSAL

## 2021-07-13 MED ORDER — GLYCOPYRROLATE 0.2 MG/ML IJ SOLN: 0.2000 mg | INTRAMUSCULAR | Status: DC | PRN

## 2021-07-13 MED ORDER — HYDROMORPHONE HCL 1 MG/ML IJ SOLN
1.0000 mg | Freq: Four times a day (QID) | INTRAMUSCULAR | Status: DC
  Administered 2021-07-13 (×2): 1 mg via INTRAVENOUS
  Filled 2021-07-13 (×2): qty 1

## 2021-07-13 MED ORDER — LORAZEPAM 2 MG/ML IJ SOLN: 0.5000 mg | INTRAMUSCULAR | Status: DC | PRN

## 2021-07-13 MED ORDER — CHLORHEXIDINE GLUCONATE CLOTH 2 % EX PADS
6.0000 | MEDICATED_PAD | Freq: Every day | CUTANEOUS | Status: DC
  Administered 2021-07-13: 6 via TOPICAL

## 2021-07-13 MED ORDER — DIPHENHYDRAMINE HCL 50 MG/ML IJ SOLN: 12.5000 mg | Freq: Four times a day (QID) | INTRAMUSCULAR | Status: DC | PRN

## 2021-07-13 NOTE — Progress Notes (Signed)
Crystal Gibbs   DOB:03/04/1976   VZ#:[id number]   PIR#:[id number] ? ?Oncology follow up ? ?Subjective: Crystal Gibbs is not doing well, confused, moaning in the bed, not able to answer my questions appropriately.  Husband is at bedside. ? ? ?Objective:  ?Vitals:  ? 07/13/21 6301 07/13/21 0923  ?BP: 115/82   ?Pulse: (!) 47   ?Resp: 20   ?Temp: 98.2 ?F (36.8 ?C)   ?SpO2: (!) 67% 92%  ?  Body mass index is 24.07 kg/m?. ? ?Intake/Output Summary (Last 24 hours) at 07/13/2021 1012 ?Last data filed at 07/13/2021 0000 ?Gross per 24 hour  ?Intake 50 ml  ?Output --  ?Net 50 ml  ? ? ? Sclerae icteric ? Lungs clear -- no rales or rhonchi ? Heart regular rate and rhythm ? Abdomen soft and distended  ? MSK no focal spinal tenderness, no peripheral edema ? ? ?CBG (last 3)  ?No results for input(s): GLUCAP in the last 72 hours. ? ? ?Labs:  ? ?Urine Studies ?No results for input(s): UHGB, CRYS in the last 72 hours. ? ?Invalid input(s): UACOL, UAPR, USPG, UPH, UTP, UGL, UKET, UBIL, UNIT, UROB, ULEU, UEPI, UWBC, URBC, UBAC, CAST, UCOM, BILUA ? ?Basic Metabolic Panel: ?Recent Labs  ?Lab 07/13/2021 ?1109 07/28/2021 ?1441 07/12/21 ?6010 07/12/21 ?0753 07/13/21 ?9323  ?NA 133* 131* 131*  --  133*  ?K 4.8 5.1 6.0* 5.7* 6.1*  ?CL 101 100 102  --  103  ?CO2 22 21* 19*  --  18*  ?GLUCOSE 82 84 68*  --  79  ?BUN 26* 26* 32*  --  46*  ?CREATININE 0.79 0.58 0.84  --  1.33*  ?CALCIUM 9.3 8.9 8.8*  --  8.6*  ?MG  --   --   --   --  2.2  ?PHOS  --   --   --   --  6.6*  ? ?GFR ?Estimated Creatinine Clearance: 48.6 mL/min (A) (by C-G formula based on SCr of 1.33 mg/dL (H)). ?Liver Function Tests: ?Recent Labs  ?Lab 07/30/2021 ?1109 07/26/2021 ?1441 07/12/21 ?5573 07/13/21 ?2202  ?AST 128* 125* 147* 296*  ?ALT 56* 56* 58* 79*  ?ALKPHOS 475* 434* 382* 339*  ?BILITOT 9.2* 8.7* 9.1* 11.1*  ?PROT 6.7 6.1* 5.7* 6.1*  ?ALBUMIN 2.7* 2.1* 2.0* 2.1*  ? ?No results for input(s): LIPASE, AMYLASE in the last 168 hours. ?No results for input(s): AMMONIA in the last 168 hours. ?Coagulation  profile ?Recent Labs  ?Lab 08/06/2021 ?1441  ?INR 1.5*  ? ? ?CBC: ?Recent Labs  ?Lab 07/23/2021 ?1109 07/25/2021 ?1441 07/12/21 ?5427 07/13/21 ?0623  ?WBC 25.2* 21.2* 18.3* 22.7*  ?NEUTROABS 20.8* 18.0*  --   --   ?HGB 8.7* 8.2* 8.7* 8.1*  ?HCT 27.6* 25.7* 27.2* 25.5*  ?MCV 89.9 91.8 92.5 94.8  ?PLT 185 156 157 154  ? ?Cardiac Enzymes: ?No results for input(s): CKTOTAL, CKMB, CKMBINDEX, TROPONINI in the last 168 hours. ?BNP: ?Invalid input(s): POCBNP ?CBG: ?No results for input(s): GLUCAP in the last 168 hours. ?D-Dimer ?No results for input(s): DDIMER in the last 72 hours. ?Hgb A1c ?No results for input(s): HGBA1C in the last 72 hours. ?Lipid Profile ?No results for input(s): CHOL, HDL, LDLCALC, TRIG, CHOLHDL, LDLDIRECT in the last 72 hours. ?Thyroid function studies ?No results for input(s): TSH, T4TOTAL, T3FREE, THYROIDAB in the last 72 hours. ? ?Invalid input(s): FREET3 ?Anemia work up ?No results for input(s): VITAMINB12, FOLATE, FERRITIN, TIBC, IRON, RETICCTPCT in the last 72 hours. ?Microbiology ?Recent Results (from the past 240  hour(s))  ?Body fluid culture w Gram Stain     Status: None (Preliminary result)  ? Collection Time: 08/02/2021  2:49 PM  ? Specimen: PATH Cytology Peritoneal fluid  ?Result Value Ref Range Status  ? Specimen Description   Final  ?  PERITONEAL ?Performed at [facility], [place] [address] [place], [place] [address] ?  ? Special Requests   Final  ?  NONE ?Performed at [facility], [place] [address], [place], [place] [address] ?  ? Gram Stain NO WBC SEEN ?NO ORGANISMS SEEN ?  Final  ? Culture   Final  ?  NO GROWTH 2 DAYS ?Performed at [facility], [place] [address] [place],  [address] ?  ? Report Status PENDING  Incomplete  ? ? ? ? ?Studies:  ?US Paracentesis ? ?Result Date: 08/06/2021 ?INDICATION: Patient with history of metastatic cholangiocarcinoma, ascites. Request received for diagnostic and therapeutic paracentesis. EXAM: ULTRASOUND GUIDED  DIAGNOSTIC AND THERAPEUTIC PARACENTESIS COMPARISON:  None MEDICATIONS: 10 mL 1% lidocaine COMPLICATIONS: None immediate. PROCEDURE: Informed written consent was obtained from patient via interpreter. Initial ultrasound scanning demonstrates a moderate amount of ascites within the right lower abdominal quadrant. The right lower abdomen was prepped and draped in the usual sterile fashion. 1% lidocaine was used for local anesthesia. Following this, a 19 gauge, 10-cm, Yueh catheter was introduced. An ultrasound image was saved for documentation purposes. The paracentesis was performed. The catheter was removed and a dressing was applied. The patient tolerated the procedure well without immediate post procedural complication. FINDINGS: A total of approximately 2.8 liters of golden yellow fluid was removed. Samples were sent to the laboratory as requested by the clinical team. IMPRESSION: Successful ultrasound-guided diagnostic and therapeutic paracentesis yielding 2.8 liters of peritoneal fluid. Read by: Rowe Robert, PA-C Electronically Signed   By: Jacqulynn Cadet M.D.   On: 07/31/2021 16:40   ? ?Assessment: 46 y.o. female  ? ?Metastatic intrahepatic cholangiocarcinoma ?Worsening hyperbilirubinemia, secondary to cancer ?Metabolic encephalopathy, secondary to #1 and 2  ?Anemia ?Hypokalemia ?Failure to thrive ? ? ? ?Plan:  ?-Her condition is clinically getting worse, likely related to her liver failure ?-Her life expectancy is likely 1 to 2 weeks.  I recommend residential hospice, due to her multiple symptoms  ?-I spoke with her husband about DNR and residential hospice, he agrees.  He called his nephew, who translated for him. ?-I called pt's niece Romesa who has been very involved in her care, and left her message about the above. ?-I spoke with Dr. Wyline Copas ? ? ? ?[name], MD ?07/13/2021  10:12 AM ? ?

## 2021-07-13 NOTE — Progress Notes (Signed)
? ?Palliative Medicine Inpatient Follow Up Note ? ? ? ? ?Chart Reviewed. Patient assessed at the bedside.  ? ?Crystal Gibbs is resting in bed.  Appears somewhat uncomfortable.  Abdomen is more distended today compared to yesterday's assessment.  Her sister is at bedside.  Husband went downstairs to meet other family members, later returned during my visit along with 2 other close friends. ? ?We reviewed goals of care discussions on yesterday.  Patient was seen by [name] earlier today and family agreed at that time for DNR/DNI.  Husband expresses some emotional distress regarding rapidness of his wife's decline and decisions.  Family expressed realistic understanding and knows decisions that are being made by them or what is best for patient to allow her to be comfortable for what time she has left.  Understands she continues to decline with noticeable changes over the past 24 hours. ? ?Crystal Gibbs has not been able to take any oral medications since late yesterday evening.  Nursing staff reports patient with some confusion and seems to have difficulty swallowing.  Due to ongoing aspiration risk will allow sips and bites for comfort.  Discussed with family regarding transitioning all care to focus on her comfort while hospitalized with a plan to [facility] once bed is available as discussed with [name] earlier.  Family verbalized understanding confirming and acknowledging plans. ? ?They are aware I will make adjustments to her pain regimen to allow her to have medication scheduled as they are concerned about when to appropriately ask if patient is not able to express she is in pain.  Education provided on nonverbal signs of discomfort including brow furring, grimacing, moaning, appearing uncomfortable/frequent movement around in bed.  Family verbalized understanding.  They are aware she will have medications scheduled as well as available for as needed use to assist in her comfort and symptom management.   Husband and other family members verbalized understanding and appreciation of support. ? ?Education provided on referral process for hospice home facility.  They are aware if bed is available once patient is approved there is a potential chance she could transfer today however [facility] hospital liaison will work closely with the medical team and provide updates on bed availability.  They are aware patient will remain hospitalized under full comfort care measures until bed becomes available.  Given patient's significant decline they are also aware there is a high likelihood depending on bed availability the patient could potentially pass away while hospitalized. ? ?Questions addressed and support provided.   ? ?Objective Assessment: ?Vital Signs ?Vitals:  ? 07/13/21 3299 07/13/21 0923  ?BP: 115/82   ?Pulse: (!) 47   ?Resp: 20   ?Temp: 98.2 ?F (36.8 ?C)   ?SpO2: (!) 67% 92%  ? ? ?Intake/Output Summary (Last 24 hours) at 07/13/2021 1311 ?Last data filed at 07/13/2021 0000 ?Gross per 24 hour  ?Intake 50 ml  ?Output --  ?Net 50 ml  ? ?Last Weight  Most recent update: 07/18/2021  6:23 PM  ? ? Weight  ?65.6 kg (144 lb 10 oz)  ?      ? ?  ? ?Gen:  NAD ?HEENT: Scleral icterus ?CV: Bradycardia, irregular ?PULM: Diminished ?ABD: soft/tender/distended/hypobowel sounds ?EXT: Bilateral lower extremity edema ?Neuro: Some confusion noted during discussions and patient's response with interpretation ? ?SUMMARY OF RECOMMENDATIONS   ?DNR/DNI-as confirmed by family ?Transition all care to focus on comfort.  No escalation in care. ?Spoke with Fabio Pierce, RN ([facility]'s hospital liaison).  She is working  to gain approval for [facility] and will provide updates on bed availability.  No bed currently available today with hopes of transferring patient on tomorrow. ?Discontinue all medications and interventions not comfort focused.  Discontinue telemetry. ?Dilaudid PRN for pain/air hunger/comfort ?Robinul PRN for excessive secretions ?Ativan PRN for  agitation/anxiety ?Zofran PRN for nausea ?Liquifilm tears PRN for dry eyes ?Haldol PRN for agitation/anxiety ?May have comfort feeding ?Comfort cart for family ?Unrestricted visitations in the setting of EOL (per policy) ?Oxygen PRN 2L or less for comfort. No escalation.   ?PMT will continue to support and follow. Please secure chat for urgent needs.  ? ?Symptom Management:  ?Neoplasm related pain ?Dilaudid 1-2 mg IV every 4 hours as needed ?1 mg IV Dilaudid scheduled ?Roxicodone as needed for breakthrough pain ?Constipation ?MiraLAX daily as needed ?Senna S twice daily as needed ?Nausea ?Zofran as needed ? ?Discussed with Dr. Shelly Bombard, family, Dr. [facility]) ? ?Time Total: 40 min ? ?Visit consisted of counseling and education dealing with the complex and emotionally intense issues of symptom management and palliative care in the setting of serious and potentially life-threatening illness.Greater than 50%  of this time was spent counseling and coordinating care related to the above assessment and plan. ? ?Alda Lea, AGPCNP-BC  ?Palliative Medicine Team ?[phone] ? ?Palliative Medicine Team providers are available by phone from 7am to 7pm daily and can be reached through the team cell phone. Should this patient require assistance outside of these hours, please call the patient's attending physician.  ?

## 2021-07-13 NOTE — Assessment & Plan Note (Signed)
Code status addressed with family. Wishes confirmed to be DNR/DNI, in light of incurable diagnosis ?

## 2021-07-13 NOTE — Plan of Care (Signed)
Family in room, calls when pt needs assistance or pain medication. Pt very weak, 2 person assist to [facility]. Husband states he will wait for the sister to help make decision on code status. ?Problem: Clinical Measurements: ?Goal: Will remain free from infection ?Outcome: Progressing ?Goal: Respiratory complications will improve ?Outcome: Progressing ?Goal: Cardiovascular complication will be avoided ?Outcome: Progressing ?  ?Problem: Coping: ?Goal: Level of anxiety will decrease ?Outcome: Progressing ?  ?Problem: Pain Managment: ?Goal: General experience of comfort will improve ?Outcome: Progressing ?  ?Problem: Safety: ?Goal: Ability to remain free from injury will improve ?Outcome: Progressing ?  ?Problem: Skin Integrity: ?Goal: Risk for impaired skin integrity will decrease ?Outcome: Progressing ?  ?

## 2021-07-13 NOTE — TOC Initial Note (Signed)
Transition of Care (TOC) - Initial/Assessment Note  ? ? ?Patient Details  ?Name: Crystal Gibbs ?MRN: [id number] ?Date of Birth: Jul 26, 1975 ? ?Transition of Care (TOC) CM/SW Contact:    ?Crystal Mage, LCSW ?Phone Number: ?07/13/2021, 11:34 AM ? ?Clinical Narrative:   After seeing both oncology and chaplain notes, contacted Ms Crystal Gibbs with [facility] to ask her to review for possible admission into [facility]. TOC will continue to follow during the course of hospitalization. ?              ? ? ?Expected Discharge Plan: [place] ?Barriers to Discharge: Other (must enter comment) [facility] pending medical review, bed availability) ? ? ?Patient Goals and CMS Choice ?  ?  ?  ? ?Expected Discharge Plan and Services ?Expected Discharge Plan: [place] ?  ?  ?  ?  ?                ?  ?  ?  ?  ?  ?  ?  ?  ?  ?  ? ?Prior Living Arrangements/Services ?  ?  ?  ?       ?  ?  ?  ?  ? ?Activities of Daily Living ?Home Assistive Devices/Equipment: None ?ADL Screening (condition at time of admission) ?Patient's cognitive ability adequate to safely complete daily activities?: Yes ?Is the patient deaf or have difficulty hearing?: No ?Does the patient have difficulty seeing, even when wearing glasses/contacts?: No ?Does the patient have difficulty concentrating, remembering, or making decisions?: No ?Patient able to express need for assistance with ADLs?: Yes ?Does the patient have difficulty dressing or bathing?: No ?Independently performs ADLs?: No ?Does the patient have difficulty walking or climbing stairs?: Yes ?Weakness of Legs: Both ?Weakness of Arms/Hands: Both ? ?Permission Sought/Granted ?  ?  ?   ?   ?   ?   ? ?Emotional Assessment ?  ?  ?  ?  ?  ?  ? ?Admission diagnosis:  Jaundice [R17] ?Malignant ascites [R18.0] ?Cancer related pain [G89.3] ?Intractable pain [R52] ?Cholangiocarcinoma ([place]) [C22.1] ?Patient Active Problem List  ? Diagnosis Date Noted  ? Hyperkalemia 07/12/2021  ? Intractable pain  07/20/2021  ? Leukocytosis 07/16/2021  ? SIRS (systemic inflammatory response syndrome) ([place]) 07/27/2021  ? Hypomagnesemia 06/29/2021  ? Anemia of chronic disease 06/29/2021  ? Sinus tachycardia 06/29/2021  ? CAP (community acquired pneumonia) 06/28/2021  ? Sepsis ([place]) 06/28/2021  ? Anemia associated with chemotherapy 06/28/2021  ? Left sided abdominal pain 11/01/2020  ? Headache 09/23/2020  ? Genetic testing 09/15/2020  ? HTN (hypertension) 09/09/2020  ? 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  ? Family history of breast cancer 08/23/2020  ? Family history of pancreatic cancer 08/23/2020  ? Family history of prostate cancer 08/23/2020  ? Family history of stomach cancer 08/23/2020  ? Abnormal uterine bleeding (AUB) 08/19/2020  ? Elevated blood pressure reading 08/19/2020  ? Gastritis and gastroduodenitis   ? Port-A-Cath in place 08/16/2020  ? Intrahepatic cholangiocarcinoma ([place]) 08/04/2020  ? Hypercalcemia 07/02/2020  ? Disorder of parathyroid gland ([place]) 07/02/2020  ? History of cataract 07/02/2020  ? 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 Health care maintenance 09/03/2015  ? Breast cancer screening, high risk patient 09/03/2015  ? 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  ? Primary hyperparathyroidism ([place]) 02/09/2015  ? Fibroid, uterine 09/30/2014  ? AMA (advanced maternal age) primigravida 35+ 09/30/2014  ? ?PCP:  Crystal Foster, MD ?Pharmacy:   ?[facility] ([place]),  [place] - [address] ?[address] ?[place]  [address] ?Phone: [phone] Fax: [phone] ? ? ? ? ?Social Determinants of Health (SDOH) Interventions ?  ? ?Readmission Risk Interventions ?   ? View : No data to display.  ?  ?  ?  ? ? ? ?

## 2021-07-13 NOTE — Progress Notes (Signed)
?  Progress Note ? ? ?Patient: Crystal Gibbs LJQ:[id number] DOB: 1975/07/09 DOA: 07/10/2021     1 ?DOS: the patient was seen and examined on 07/13/2021 ?  ?Brief hospital course: ?This 46 years old female with PMH significant for metastatic cholangiocarcinoma, hypertension, presented in the ED with abdominal pain. She was recently hospitalized for community-acquired pneumonia and she was discharged on 07/03/2021.  Patient has developed intractable abdominal pain, patient has followed up with oncologist,  Labs were drawn found to have elevated bilirubin and she was sent to the ED for urgent paracentesis   Patient underwent successful paracentesis. ?Recent oncologist note.  Unfortunately her cancer has progressed on recent CT scan, due to her poor performance status,  She is not a candidate for more chemotherapy.  Her life expectancy is likely a few months or less.  Recommend home hospice. ?Patient is admitted for pain control,  they are not requesting work-up for hyperbilirubinemia.  Palliative care is consulted to discuss goals of care. ? ?Assessment and Plan: ?* Intractable pain ?Continue adequate pain control with Dilaudid, oxycodone. ?Palliative care consulted  ?Cont with analgesia as needed for comfort ? ?HTN (hypertension) ?Had been continued on metoprolol 12.5 mg every 12hr ?Now focus on comfort ? ?Intrahepatic cholangiocarcinoma ([place]) ?Onco note: Unfortunately her cancer has progressed on recent CT scan, due to her poor performance status,  She is not a candidate for more chemotherapy.  Her life expectancy is likely few months or less.  Recommend home hospice. ?Palliative care was consulted ?Pt is s/p paracentesis, reports abdominal pain has not improved. ?Pt now not eating or taking meds. Prognosis now <2 weeks and would be appropriate for residential hospice ?Met with pt and husband at bedside. Wishes confirmed to be DNR/DNI with focus primarily on comfort at this time ? ?DNR (do not resuscitate) ?Code status addressed  with family. Wishes confirmed to be DNR/DNI, in light of incurable diagnosis ? ?Hyperkalemia ?Lokelma given, but remained elevated ?Given one dose of IV insulin with D50 and one amp of bicarb ?Later transitioned to comfort measures ? ?SIRS (systemic inflammatory response syndrome) (HCC) ?Likely stress related.  Procalcitonin 2.56, lactic acid normal. ?Patient transitioned to comfort measures ? ?Leukocytosis ?Likely stress related.  ?Focus on comfort ? ?Anemia of chronic disease ?H&H remains stable had noted to be stable ?-Now focus on comfort ? ? ? ? ?  ? ?Subjective: Unable to assess given mentation ? ?Physical Exam: ?Vitals:  ? 07/13/21 0502 07/13/21 0922 07/13/21 0923 07/13/21 1300  ?BP: 96/65 115/82  109/78  ?Pulse: (!) 104 (!) 47  (!) 55  ?Resp: 18 20  (!) 21  ?Temp: 98.2 ?F (36.8 ?C) 98.2 ?F (36.8 ?C)  98.1 ?F (36.7 ?C)  ?TempSrc: Oral Oral  Oral  ?SpO2: 96% (!) 67% 92% 91%  ?Weight:      ?Height:      ? ?General exam: Awake, sitting in bed, appears uncomfortable ?Respiratory system: Normal respiratory effort, no wheezing ?Cardiovascular system: regular rate, s1, s2 ?Gastrointestinal system: Soft, nondistended, positive BS ?Central nervous system: CN2-12 grossly intact, strength intact ?Extremities: Perfused, no clubbing ?Skin: Normal skin turgor, no notable skin lesions seen ?Psychiatry: Unable to assess given mentation ? ?Data Reviewed: ? ?Labs reviewed: K 6.1, Cr 1.33 ? ?Family Communication: Pt in room, husband at bedside ? ?Disposition: ?Status is: Inpatient ?Remains inpatient appropriate because: Severity of illness ? Planned Discharge Destination:  Residential hospice ? ? ? ? ?Author: ?Marylu Lund, MD ?07/13/2021 4:15 PM ? ?For on call review [email or web address].  ?

## 2021-07-13 NOTE — Progress Notes (Addendum)
Manufacturing engineer [facility]) Hospital Liaison Note ? ?Referral received for patient/family interest in [facility].  ? ?Hospice eligibility confirmed.   ? ?Unfortunately, [facility] is unable to offer a room today. Hospital Liaison will follow up tomorrow or sooner if a room becomes available. Please do not hesitate to call with questions.   ? ?Thank you for the opportunity to participate in this patient's care. ? ? ?Please call with any questions or concerns. Thank you ? ?Roselee Nova, LCSW ?[facility] Liaison ?[phone] ?

## 2021-07-13 NOTE — Progress Notes (Signed)
Chaplain engaged in an initial visit with Aloha and her husband.  Chaplain specifically worked to provide support to [employer] husband after nurse noticed he was having a hard time after [place] became DNR.  Chaplain learned also of their Muslim faith tradition and worked to honor that by calling an [employer] for them or seeing if I could call a clergy person for them.  Husband noted that he was receiving a lot of support from Valentine's sisters and that they have a Muslim minister they can call.  Chaplain worked then to simply offer a compassionate presence.  Chaplain affirmed the hard decisions he has had to make and let him know the Spiritual Care Team is there to support him.  ? ?Husband did ask for clarity around when [facility] would be discharged.  Chaplain checked in with nurse who voiced that decision for home with hospice or a hospice facility was being made.  When Chaplain relayed that to Husband, he voiced that the physician stated that going home would not be possible and they had decided on a facility.  Husband and family are on board with [facility] transitioning to a hospice facility.  ? ?Chaplain offered presence, support, and listening.  ? ? ? 07/13/21 1000  ?Clinical Encounter Type  ?Visited With Patient and family together  ?Visit Type Initial;Spiritual support  ?Referral From Nurse  ?Consult/Referral To Chaplain  ? ? ?

## 2021-07-15 LAB — BODY FLUID CULTURE W GRAM STAIN
Culture: NO GROWTH
Gram Stain: NONE SEEN

## 2021-08-08 NOTE — Progress Notes (Signed)
? ? ?  OVERNIGHT PROGRESS REPORT ? ?Notified by RN that patient has expired at 0455 ? ?Patient was DNR/comfort care track. ? ?2 RN verified. ? ?Family was notified by RN. ? ? ? ? ?Gershon Cull MSNA ACNPC-AG ?Acute Care Nurse Practitioner ?[facility] ?[place] ? ?

## 2021-08-08 NOTE — Discharge Summary (Signed)
?Death Summary  ?Crystal Gibbs DGU:[id number] DOB: May 09, 1975 DOA: 08-06-2021 ? ?PCP: [name], MD ? ?Admit date: 2021-08-06 ?Date of Death: 2021/08/09 ?Time of Death: 64 ?Notification: [name], MD notified of death of 09-Aug-2021 ? ? ?History of present illness:  ?Patient was a 46 years old female with PMH significant for metastatic cholangiocarcinoma, hypertension, who presented in the ED with abdominal pain. She was recently hospitalized for community-acquired pneumonia and she was discharged on 07/03/2021.  Patient has developed intractable abdominal pain, patient has followed up with oncologist,  Labs were drawn found to have elevated bilirubin and she was sent to the ED for urgent paracentesis   Patient underwent successful paracentesis. ?Per recent oncologist note.  Unfortunately her cancer has progressed on recent CT scan, due to her poor performance status,  She is not a candidate for more chemotherapy.  Her life expectancy is likely a few months or less.  Recommend home hospice. ?Patient was admitted for pain control,  they are not requesting work-up for hyperbilirubinemia.  Palliative care was consulted. ? ?Final Diagnoses:  ?* Intractable pain ?Patient was continued with analgesia for comfort ?Palliative care consulted  ?  ?HTN (hypertension) ?Had initially been continued on metoprolol 12.5 mg every 12hr, later with focus on comfort ?  ?Intrahepatic cholangiocarcinoma ([place]) ?Onco note: Unfortunately her cancer has progressed on recent CT scan, due to her poor performance status,  She is not a candidate for more chemotherapy.  Her life expectancy is likely few months or less.  Recommend home hospice. ?Palliative care was consulted ?Pt is s/p paracentesis, reports abdominal pain has not improved. ?Pt later no longer ate or took meds. Prognosis later noted to be <2 weeks and patient was considered for residential hospice ?Wishes confirmed to be DNR/DNI with focus primarily on comfort  at this time ?On the early morning of 10-Aug-2022 at 0455, patient was pronounced ?  ?DNR (do not resuscitate) ?Code status addressed with family. Wishes confirmed to be DNR/DNI, in light of incurable diagnosis ?  ?Hyperkalemia ?Lokelma given, but remained elevated ?Given one dose of IV insulin with D50 and one amp of bicarb ?Later transitioned to comfort measures ?  ?SIRS (systemic inflammatory response syndrome) (HCC) ?Likely stress related.  Procalcitonin 2.56, lactic acid normal. ?Patient transitioned to comfort measures ?  ?Leukocytosis ?Likely stress related.  ?Focused on comfort ?  ?Anemia of chronic disease ?H&H remains stable had noted to be stable ?-Later focused on comfort ?  ? ?The results of significant diagnostics from this hospitalization (including imaging, microbiology, ancillary and laboratory) are listed below for reference.   ? ?Significant Diagnostic Studies: ?DG Chest 2 View ? ?Result Date: 06/28/2021 ?CLINICAL DATA:  Shortness of breath.  Cancer patient. EXAM: CHEST - 2 VIEW COMPARISON:  Chest x-ray 06/24/2021.  Chest CT 06/25/2021. FINDINGS: Right chest port catheter tip projects over the distal SVC, unchanged. There is new airspace disease in the right lung base. There are increased central interstitial markings. Pulmonary nodular densities are grossly unchanged. No evidence for pneumothorax or definite pleural effusion. Cardiomediastinal silhouette is stable. Osseous structures are stable. IMPRESSION: 1. New right basilar atelectasis and airspace disease. 2. Increased in central interstitial markings may related to infectious/inflammation or central pulmonary vascular congestion. Electronically Signed   By: Ronney Asters M.D.   On: 06/28/2021 21:04  ? ?DG Chest 2 View ? ?Result Date: 06/24/2021 ?CLINICAL DATA:  cough EXAM: CHEST - 2 VIEW COMPARISON:  Chest x-ray 07/23/2015, CT cardiac 05/19/2021 FINDINGS: Right chest wall Port-A-Cath with tip  overlying the right atrium. The heart and mediastinal  contours are within normal limits. Low lung volumes. Multiple scattered pulmonary nodules noted. No focal consolidation. No pulmonary edema. No pleural effusion. No pneumothorax. No acute osseous abnormality. IMPRESSION: 1. Multiple scattered pulmonary nodules consistent with known metastases. 2. Right chest wall Port-A-Cath with tip overlying the right atrium. Electronically Signed   By: Iven Finn M.D.   On: 06/24/2021 22:10  ? ?CT Angio Chest PE W and/or Wo Contrast ? ?Result Date: 06/25/2021 ?CLINICAL DATA:  Cough, fever, nausea/vomiting/diarrhea, body aches, recent travel to [place]. Known liver cancer. EXAM: CT ANGIOGRAPHY CHEST CT ABDOMEN AND PELVIS WITH CONTRAST TECHNIQUE: Multidetector CT imaging of the chest was performed using the standard protocol during bolus administration of intravenous contrast. Multiplanar CT image reconstructions and MIPs were obtained to evaluate the vascular anatomy. Multidetector CT imaging of the abdomen and pelvis was performed using the standard protocol during bolus administration of intravenous contrast. RADIATION DOSE REDUCTION: This exam was performed according to the departmental dose-optimization program which includes automated exposure control, adjustment of the mA and/or kV according to patient size and/or use of iterative reconstruction technique. CONTRAST:  159m OMNIPAQUE IOHEXOL 350 MG/ML SOLN COMPARISON:  CT chest dated 05/19/2021. CTA abdomen/pelvis dated 05/19/2021. FINDINGS: CTA CHEST FINDINGS Cardiovascular: Satisfactory opacification of the bilateral pulmonary arteries to the lobar level. No evidence of pulmonary embolism. Although not tailored for evaluation of the thoracic aorta, there is no evidence of thoracic aortic aneurysm. The heart is normal in size.  No pericardial effusion. Right chest port terminates in the upper right atrium. Mediastinum/Nodes: 18 mm short axis right perihilar node (series 3/image 100), previously 13 mm. Visualized  thyroid is unremarkable. Lungs/Pleura: Numerous bilateral pulmonary nodules, compatible with metastatic disease, including a 12 mm nodule at the medial right lung base (series 4/image 72) and a 13 mm nodule in the left lower lobe (series 4/image 66). These previously measured 11 and 12 mm respectively. Eventration of the right hemidiaphragm with associated mild right basilar atelectasis. Trace right pleural effusion. No pneumothorax. Musculoskeletal: Visualized osseous structures are within normal limits. Review of the MIP images confirms the above findings. CT ABDOMEN and PELVIS FINDINGS Hepatobiliary: Multifocal necrotic masses, measuring up to 11.3 cm in the right hepatic lobe (series 4/image 17) and 10.2 cm in the left hepatic lobe (series 4/image 21), previously 8.7 and 10.2 cm respectively. Additional numerous multifocal hepatic metastases throughout the liver, many of which are new from the recent prior. Gallbladder is underdistended but unremarkable. Pancreas: Within normal limits. Spleen: Within normal limits. Adrenals/Urinary Tract: Adrenal glands are within normal limits. 16 mm cyst in the anterior left kidney (series 4/image 40). Right kidney is within normal limits. No hydronephrosis. Mildly thick-walled bladder, although underdistended. Stomach/Bowel: Stomach is within normal limits. No evidence of bowel obstruction. Normal appendix (series 4/image 85). No colonic wall thickening or inflammatory changes. Vascular/Lymphatic: No evidence of abdominal aortic aneurysm. Atherosclerotic calcifications of the abdominal aorta and branch vessels. Retroaortic left renal vein. No suspicious abdominopelvic lymphadenopathy. Reproductive: Uterine fibroids. Bilateral ovaries are within normal limits. Other: Small to moderate abdominopelvic ascites, new. No frank peritoneal nodularity/omental caking. Musculoskeletal: No focal osseous lesions. Review of the MIP images confirms the above findings. IMPRESSION: No  evidence of pulmonary embolism. Large hepatic masses with multifocal hepatic metastases in this patient with known liver cancer, progressive. Multifocal pulmonary metastases, progressive. Right perihilar nodal meta

## 2021-08-08 DEATH — deceased

## 2022-11-27 IMAGING — US US BIOPSY CORE LIVER
1 series · 13 of 22 positions shown · non-contrast
Comparison: None.

INDICATION: 45 year old female with history of indeterminate liver mass.

EXAM:
ULTRASOUND GUIDED LIVER LESION BIOPSY

[Series 1: us biopsy (liver) · 13 of 22 slices shown]
[im 1/22]
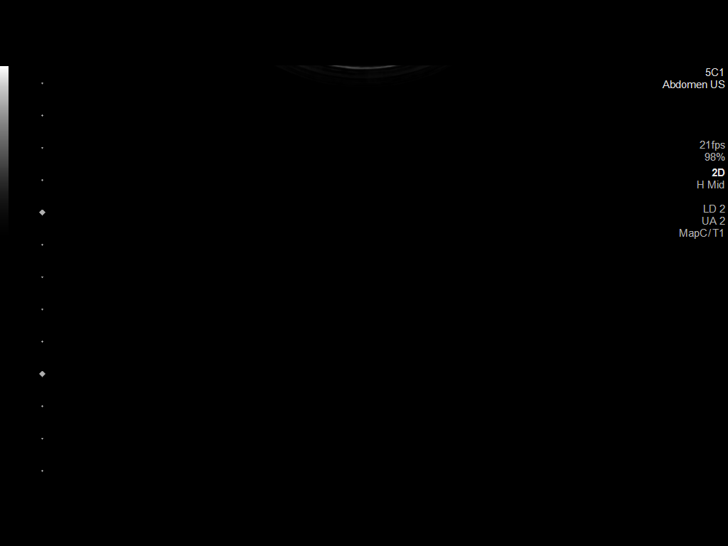
[im 3/22]
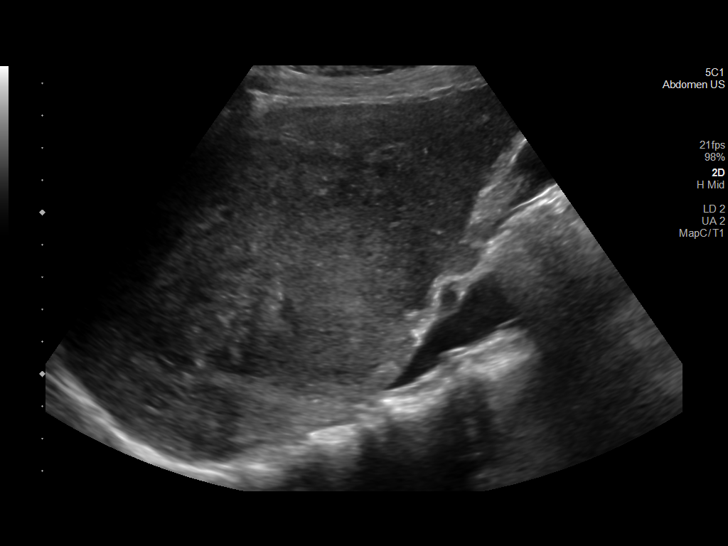
[im 5/22]
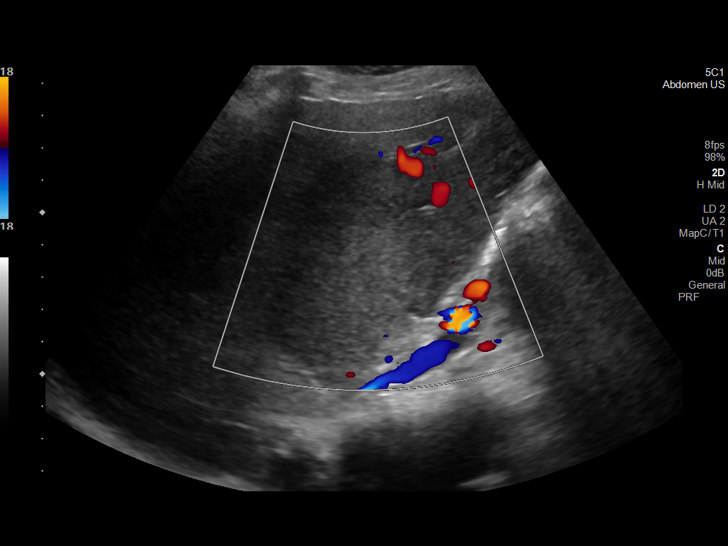
[im 6/22]
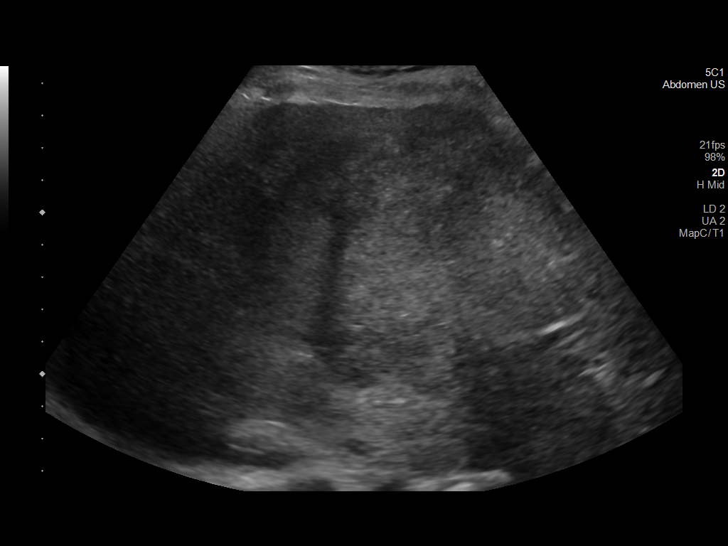
[im 8/22]
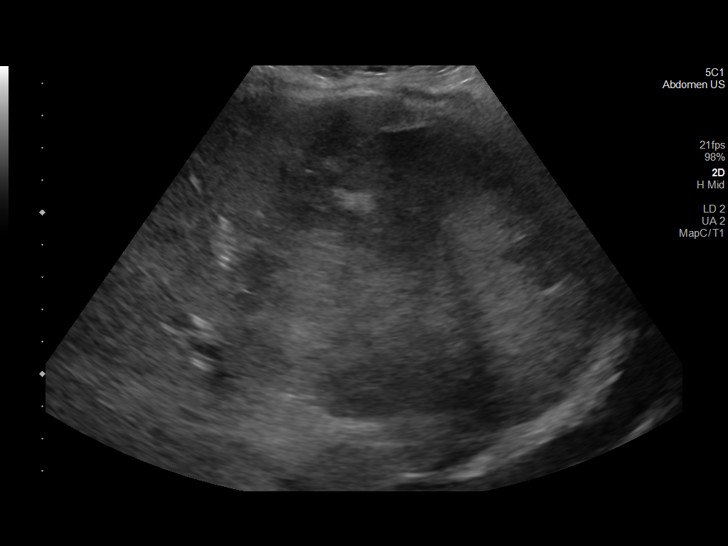
[im 10/22]
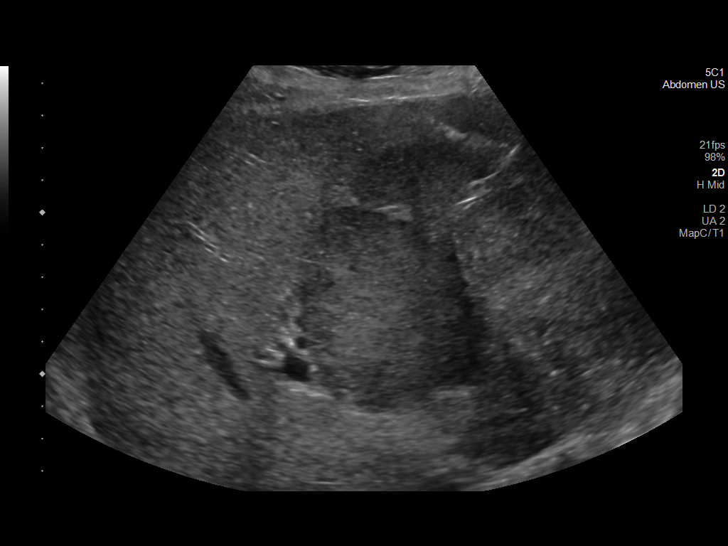
[im 12/22]
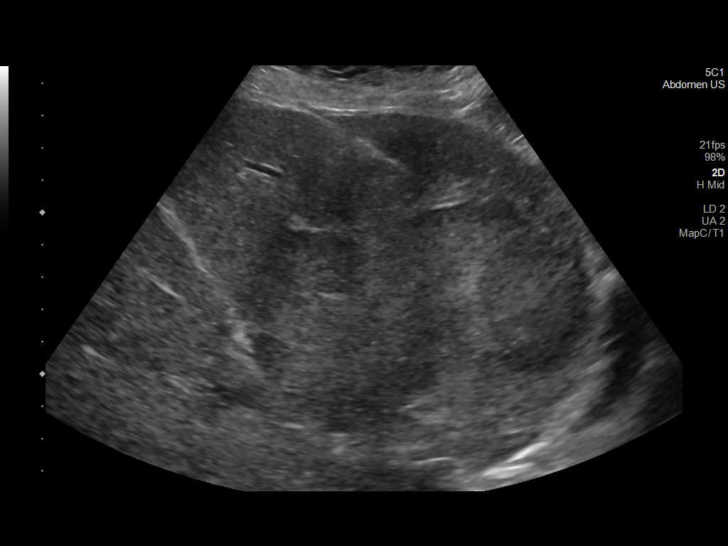
[im 13/22]
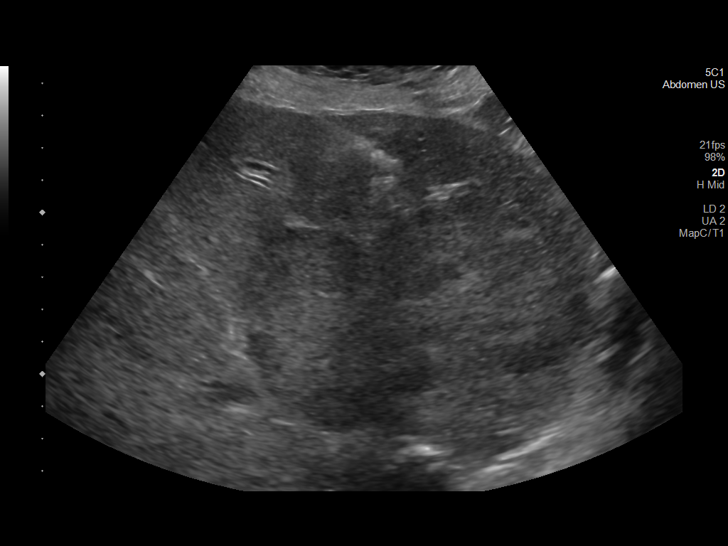
[im 15/22]
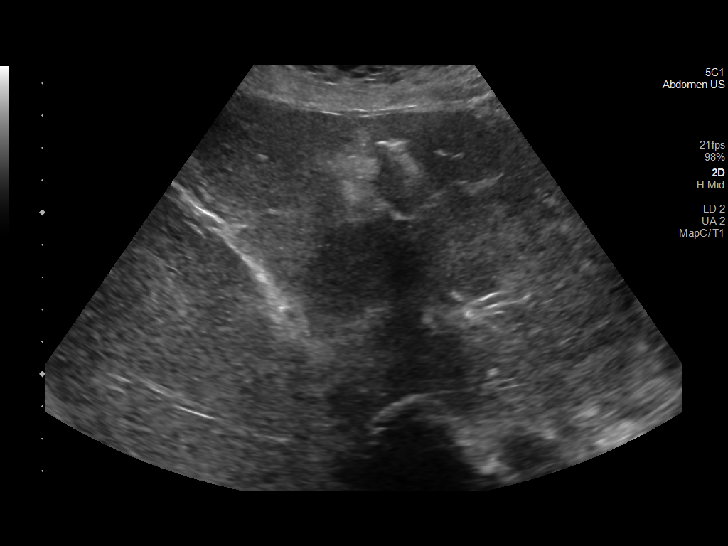
[im 17/22]
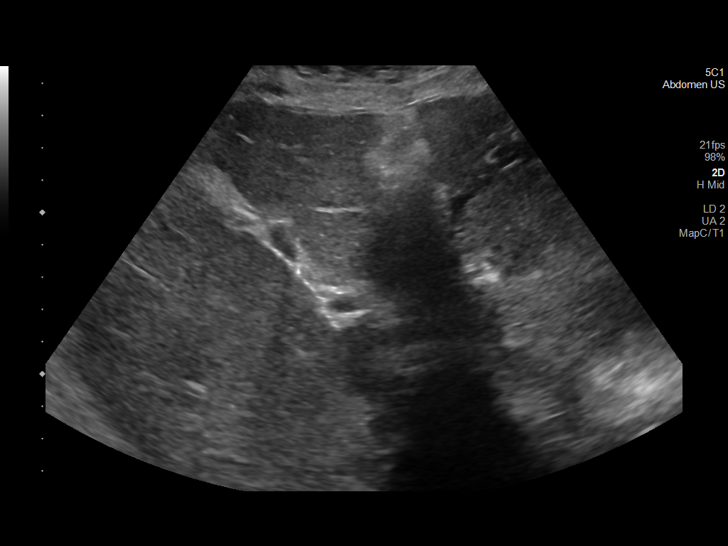
[im 18/22]
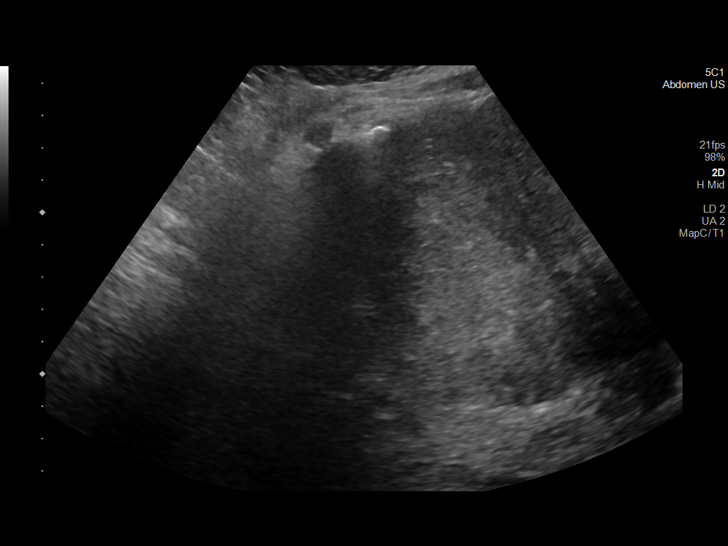
[im 20/22]
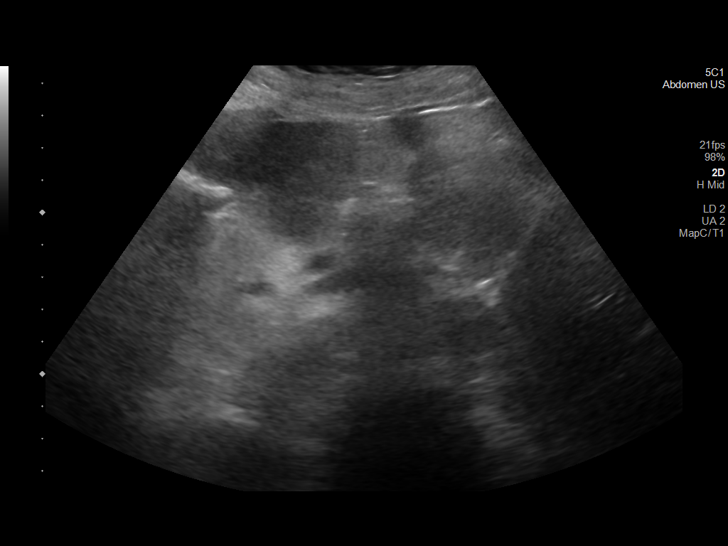
[im 22/22]
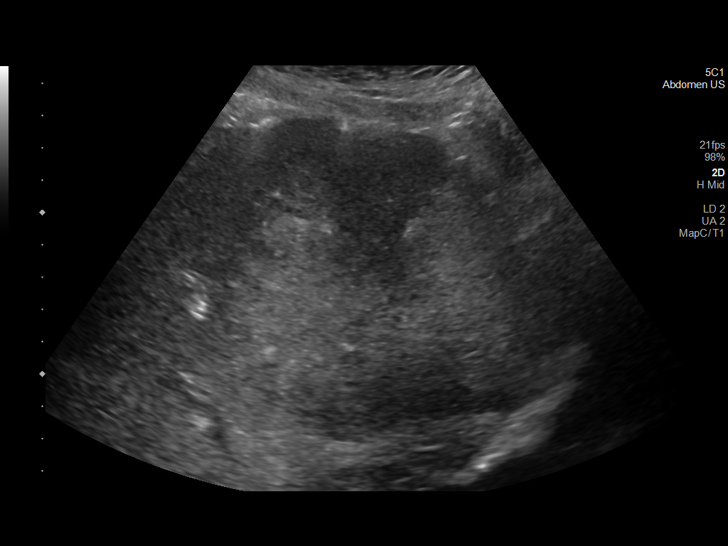

[13 of 22 positions shown; findings below may reference images not displayed]

MEDICATIONS:
None

ANESTHESIA/SEDATION:
Moderate (conscious) sedation was employed during this procedure. A
total of Versed 2 mg and Fentanyl 100 mcg was administered
intravenously.

Moderate Sedation Time: 10 minutes. The patient's level of
consciousness and vital signs were monitored continuously by
radiology nursing throughout the procedure under my direct
supervision.

COMPLICATIONS:
None immediate.

PROCEDURE:
Informed written consent was obtained from the patient after a
discussion of the risks, benefits and alternatives to treatment. The
patient understands and consents the procedure. A timeout was
performed prior to the initiation of the procedure.

Ultrasound scanning was performed of the right upper abdominal
quadrant demonstrates similar appearing large left hepatic mass.

The anterior portion mass in the left lobe was selected for biopsy
and the procedure was planned. The subxiphoid region of the abdomen
was prepped and draped in the usual sterile fashion. The overlying
soft tissues were anesthetized with 1% lidocaine with epinephrine. A
17 gauge, 6.8 cm co-axial needle was advanced into a peripheral
aspect of the lesion. This was followed by 3 core biopsies with an
18 gauge core device under direct ultrasound guidance.

The coaxial needle track was embolized with a small amount of
Gel-Foam slurry and superficial hemostasis was obtained with manual
compression. Post procedural scanning was negative for definitive
area of hemorrhage or additional complication. A dressing was
placed. The patient tolerated the procedure well without immediate
post procedural
IMPRESSION: Technically successful ultrasound guided core needle biopsy of left
lobe liver mass.

## 2022-12-01 IMAGING — CT CT CHEST W/ CM
2 of 4 series · 15 of 36 positions shown, 18 images · IV contrast (omnipaque)
Comparison: CT abdomen pelvis 07/26/2020 MR abdomen 07/10/2020.

CLINICAL DATA: Liver cancer diagnosed yesterday.  Staging.

EXAM:
CT CHEST WITH CONTRAST
TECHNIQUE: Multidetector CT imaging of the chest was performed during
intravenous contrast administration.
CONTRAST:  75mL OMNIPAQUE IOHEXOL 300 MG/ML  SOLN

[Series 2: axial st · axial · 0.60mm/px · z∈[-410,-146]mm · 12 of 158 slices shown, 15 images]
[im 13/158  mediastinal]
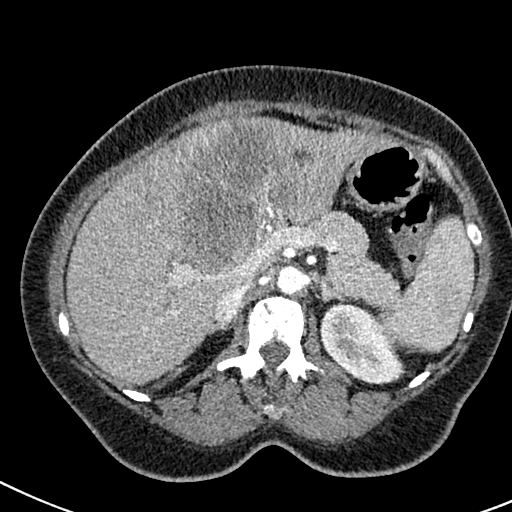
[im 13/158  lung]
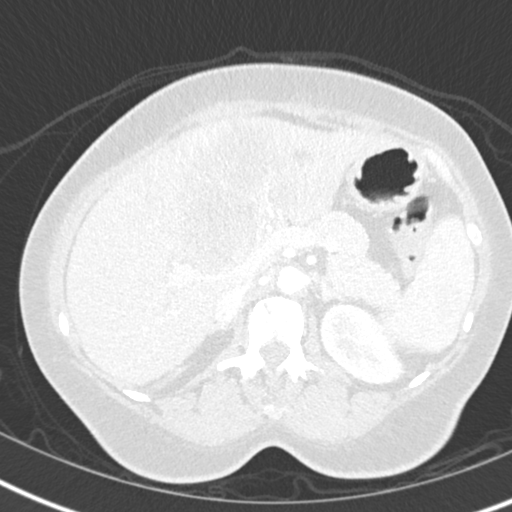
[im 25/158  lung]
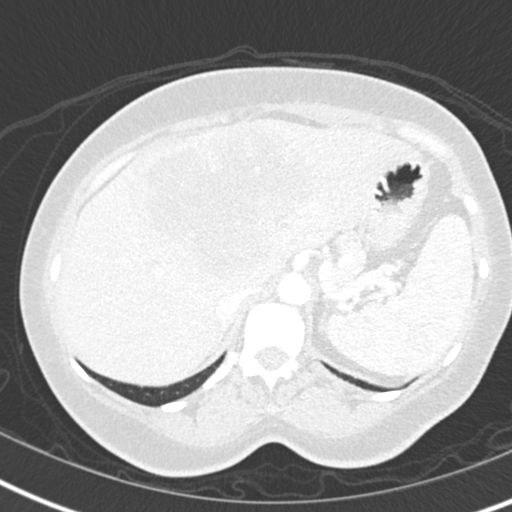
[im 37/158  lung]
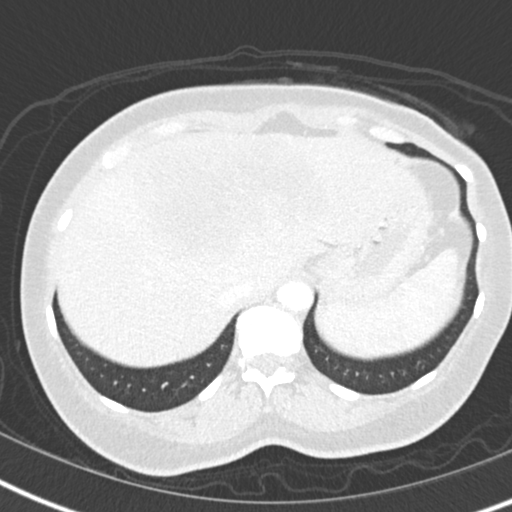
[im 49/158  lung]
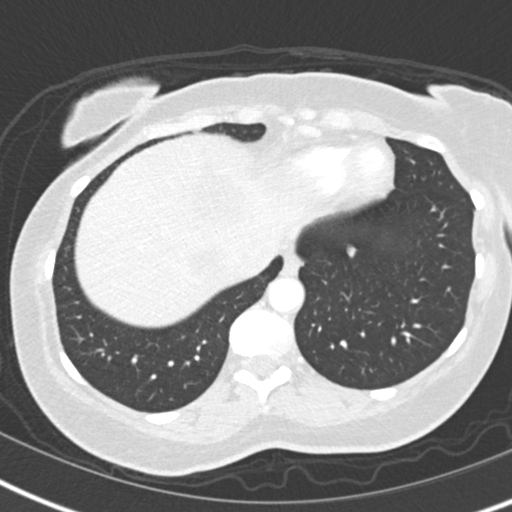
[im 61/158  mediastinal]
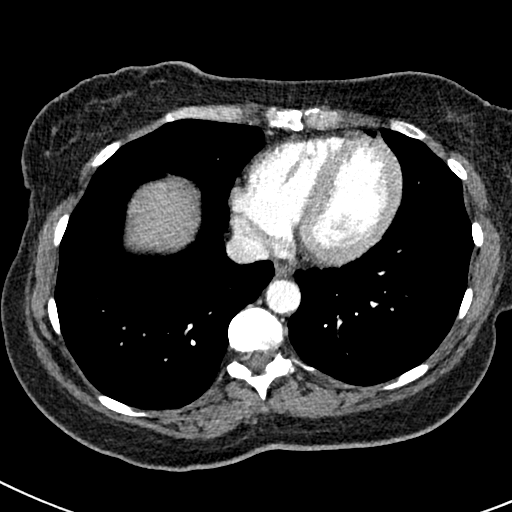
[im 61/158  lung]
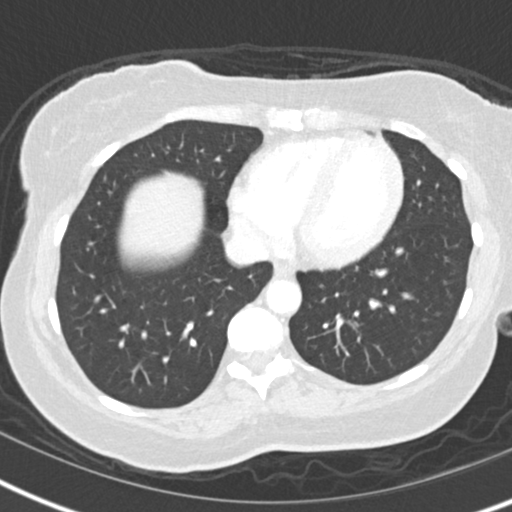
[im 73/158  lung]
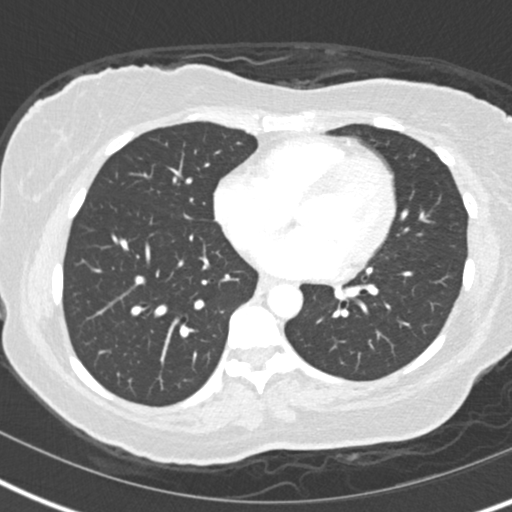
[im 85/158  lung]
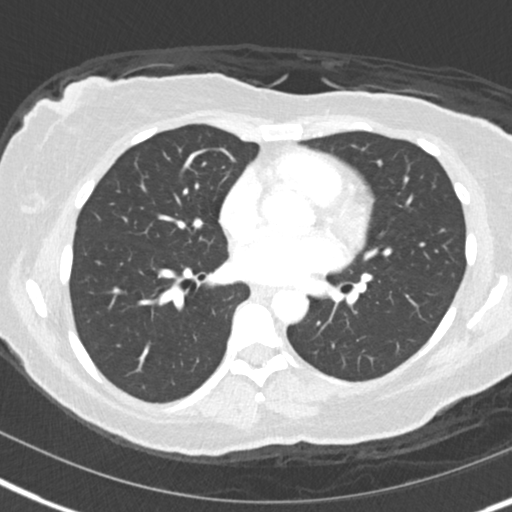
[im 97/158  lung]
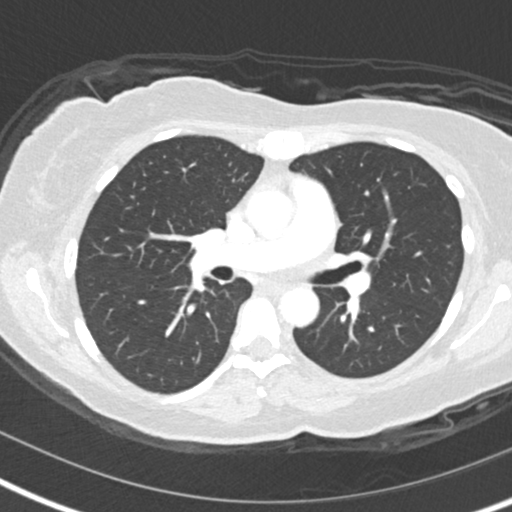
[im 109/158  mediastinal]
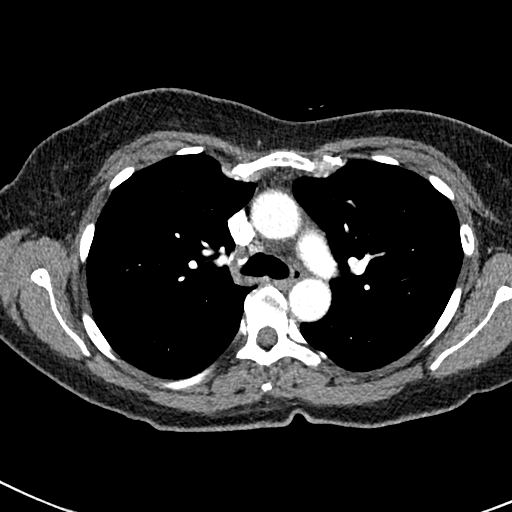
[im 109/158  lung]
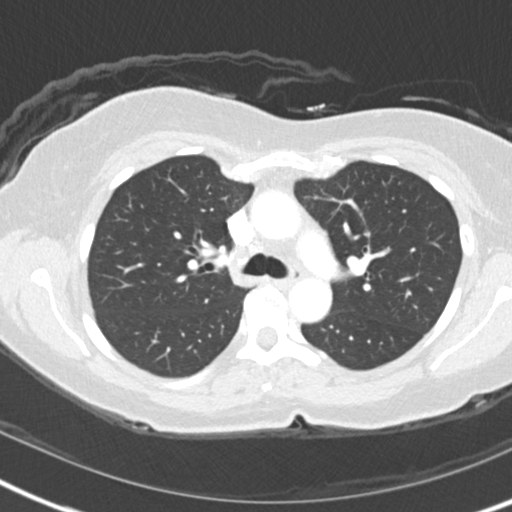
[im 121/158  lung]
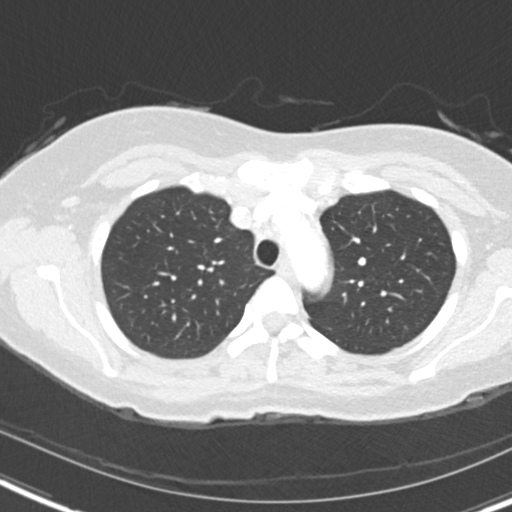
[im 133/158  lung]
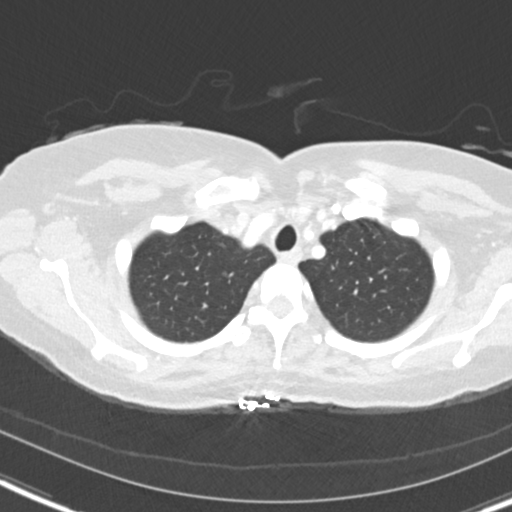
[im 145/158  lung]
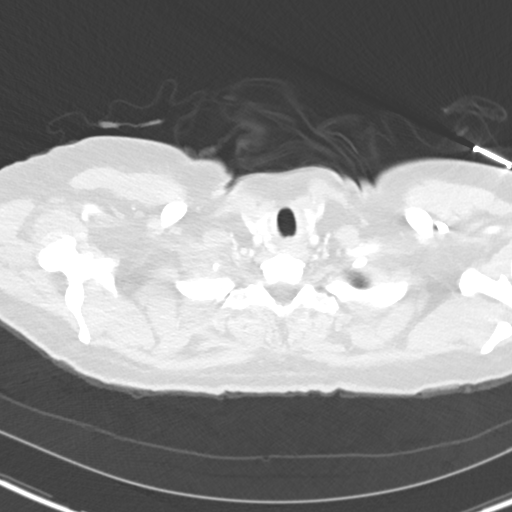

[Series 6: coronal · coronal · 0.63mm/px · 3 of 114 slices shown]
[im 23/114  lung]
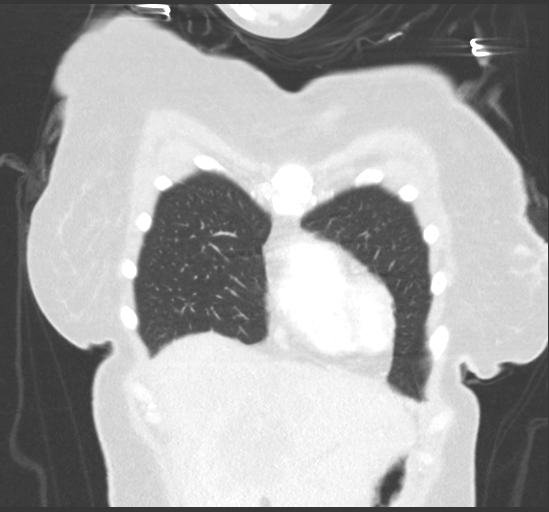
[im 46/114  lung]
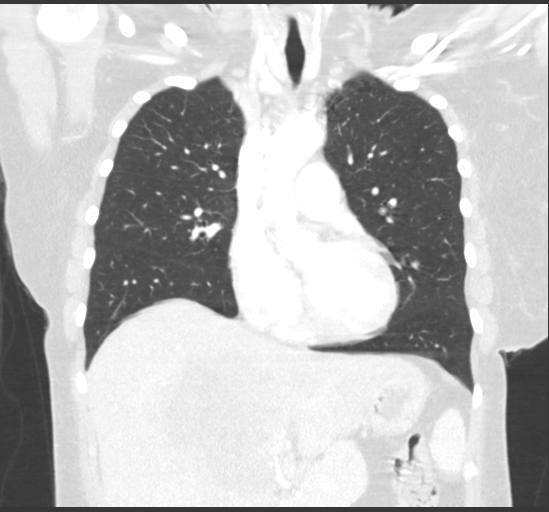
[im 68/114  lung]
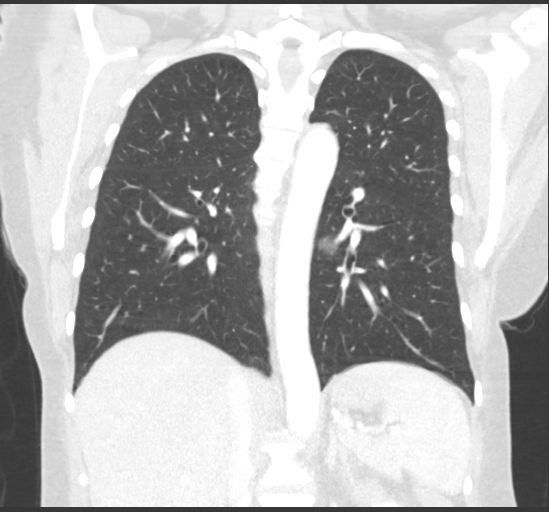

[15 of 36 positions shown; findings below may reference images not displayed]

FINDINGS: Cardiovascular: Vascular structures are unremarkable. Heart size
normal. No pericardial effusion.

Mediastinum/Nodes: No pathologically enlarged mediastinal, hilar or
axillary lymph nodes. Esophagus is grossly unremarkable.

Lungs/Pleura: There are pulmonary nodules in the peripheral right
upper lobe (5/52), right lower lobe (5/48) and left lower lobe
(5/111), measuring up to 6 mm in the left lower lobe (5/111). Lungs
are otherwise clear. No pleural fluid. Airway is unremarkable.

Upper Abdomen: Ill-defined heterogeneous central liver mass measures
approximately 7.4 x 10.9 cm. Smaller adjacent mass in the dome of
the right hepatic lobe measures 2.8 x 3.2 cm. Visualized portions of
the adrenal glands and right kidney are unremarkable.
Low-attenuation lesion in the left kidney measures at least 1.7 cm,
incompletely imaged and characterized as a simple cyst on
07/10/2020. Visualized portions of the spleen, pancreas, stomach and
bowel are grossly unremarkable.

Musculoskeletal: Mild degenerative changes in the spine. No
worrisome lytic or sclerotic lesions.
IMPRESSION: 1. Pulmonary nodules are highly worrisome for metastatic disease.
2. Hepatic masses are consistent with biopsy-proven adenocarcinoma
and better evaluated on MR abdomen 07/10/2020.

## 2022-12-15 IMAGING — XA IR IMAGING GUIDED PORT INSERTION
1 series · 1 of 1 positions shown · non-contrast
Comparison: none

CLINICAL DATA: Cholangiocarcinoma and need for porta cath for
chemotherapy.

[Series 300: ir imaging guided port insertion · 1 of 1 slices shown]
[im 1/1]
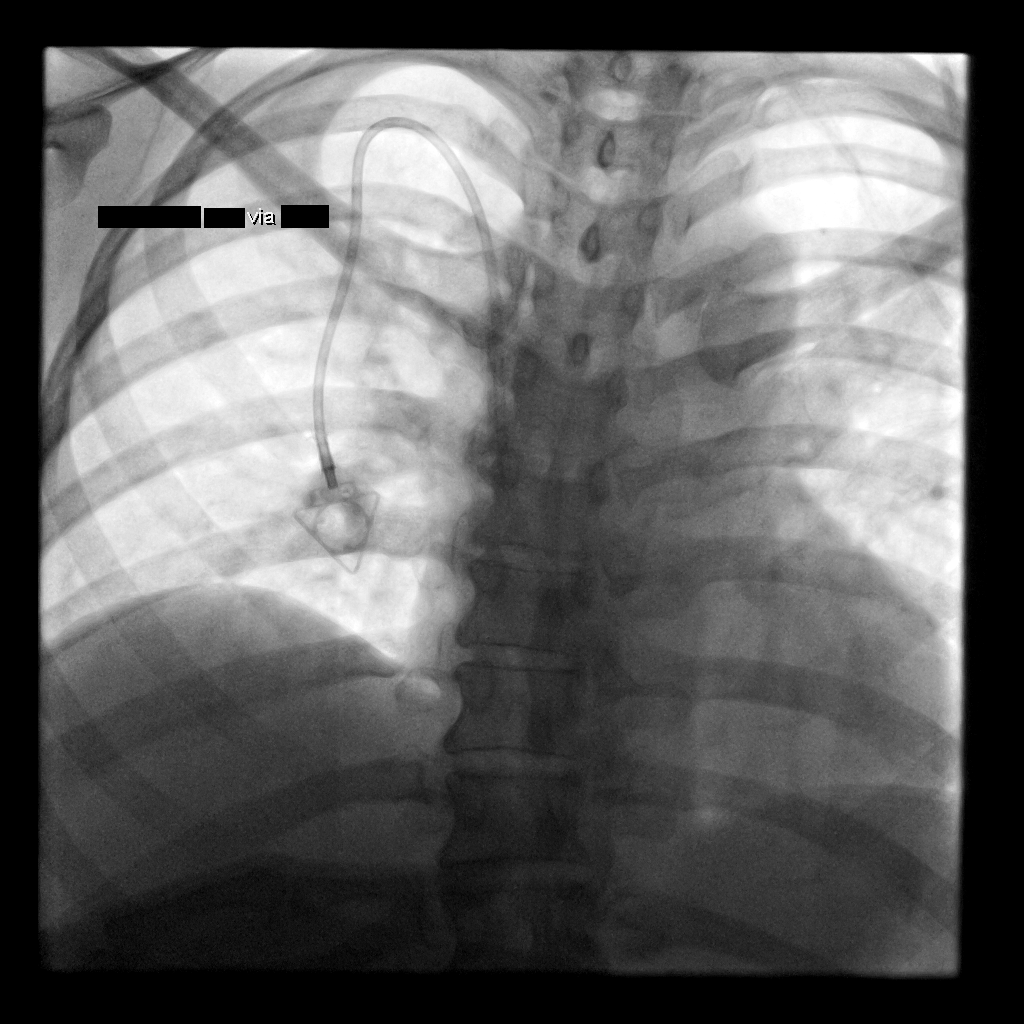

[1 of 1 positions shown; findings below may reference images not displayed]

EXAM:
IMPLANTED PORT A CATH PLACEMENT WITH ULTRASOUND AND FLUOROSCOPIC
GUIDANCE

ANESTHESIA/SEDATION:
4.0 mg IV Versed; 200 mcg IV Fentanyl

Total Moderate Sedation Time:  36 minutes

The patient's level of consciousness and physiologic status were
continuously monitored during the procedure by Radiology nursing.

FLUOROSCOPY TIME:  30 seconds.  4.0 mGy.

PROCEDURE:
The procedure, risks, benefits, and alternatives were explained to
the patient. Questions regarding the procedure were encouraged and
answered. The patient understands and consents to the procedure. A
time-out was performed prior to initiating the procedure.

Ultrasound was utilized to confirm patency of the right internal
jugular vein. The right neck and chest were prepped with
chlorhexidine in a sterile fashion, and a sterile drape was applied
covering the operative field. Maximum barrier sterile technique with
sterile gowns and gloves were used for the procedure. Local
anesthesia was provided with 1% lidocaine.

After creating a small venotomy incision, a 21 gauge needle was
advanced into the right internal jugular vein under direct,
real-time ultrasound guidance. Ultrasound image documentation was
performed. After securing guidewire access, an 8 Fr dilator was
placed. A J-wire was kinked to measure appropriate catheter length.

A subcutaneous port pocket was then created along the upper chest
wall utilizing sharp and blunt dissection. Portable cautery was
utilized. The pocket was irrigated with sterile saline.

A single lumen power injectable port was chosen for placement. The 8
Fr catheter was tunneled from the port pocket site to the venotomy
incision. The port was placed in the pocket. External catheter was
trimmed to appropriate length based on guidewire measurement.

At the venotomy, an 8 Fr peel-away sheath was placed over a
guidewire. The catheter was then placed through the sheath and the
sheath removed. Final catheter positioning was confirmed and
documented with a fluoroscopic spot image. The port was accessed
with a needle and aspirated and flushed with heparinized saline. The
access needle was removed.

The venotomy and port pocket incisions were closed with subcutaneous
3-0 Monocryl and subcuticular 4-0 Vicryl. Dermabond was applied to
both incisions.

COMPLICATIONS:
COMPLICATIONS
None
FINDINGS: After catheter placement, the tip lies at the [place] junction.
The catheter aspirates normally and is ready for immediate use.
IMPRESSION: Placement of single lumen port a cath via right internal jugular
vein. The catheter tip lies at the [place] junction. A power
injectable port a cath was placed and is ready for immediate use.

## 2023-01-10 IMAGING — US US PELVIS COMPLETE WITH TRANSVAGINAL
1 series · 13 of 25 positions shown · non-contrast
Comparison: 12/03/2014

CLINICAL DATA: Vaginal bleeding, abnormal uterine bleeding,
irregular bleeding

EXAM:
TRANSABDOMINAL AND TRANSVAGINAL ULTRASOUND OF PELVIS
TECHNIQUE: Both transabdominal and transvaginal ultrasound examinations of the
pelvis were performed. Transabdominal technique was performed for
global imaging of the pelvis including uterus, ovaries, adnexal
regions, and pelvic cul-de-sac. It was necessary to proceed with
endovaginal exam following the transabdominal exam to visualize the
endometrial complex and RIGHT ovary.

[Series 1: us pelvis complete with transvaginal · 0.25mm/px · 13 of 76 slices shown]
[im 1/76]
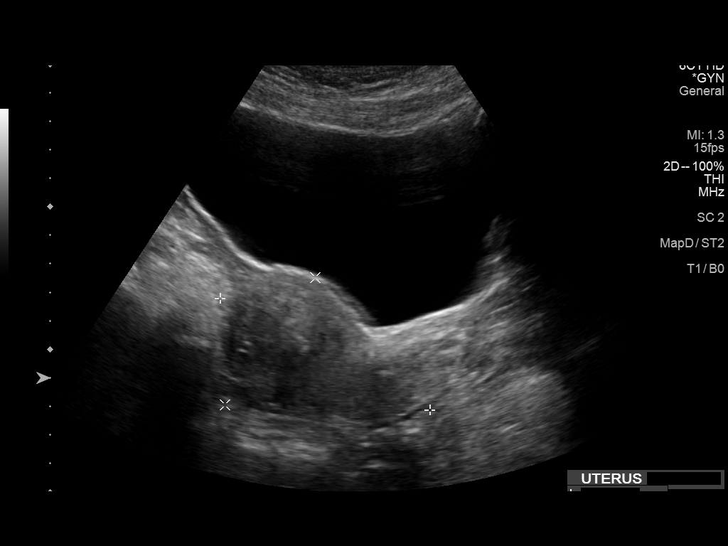
[im 7/76]
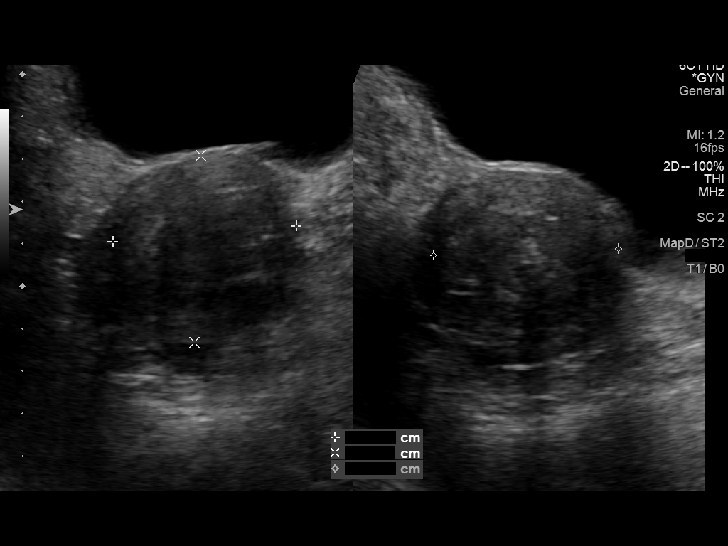
[im 13/76]
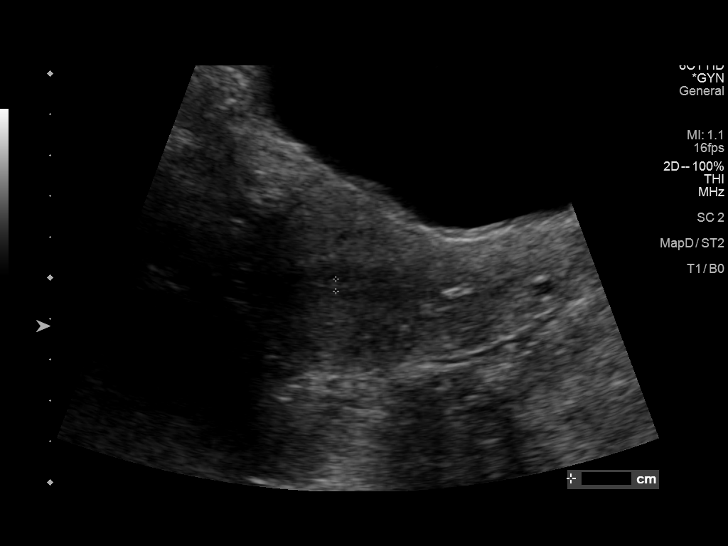
[im 19/76]
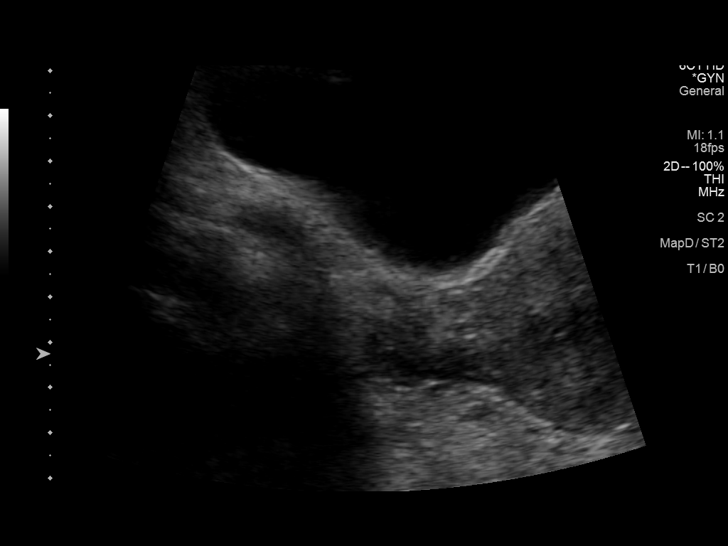
[im 26/76]
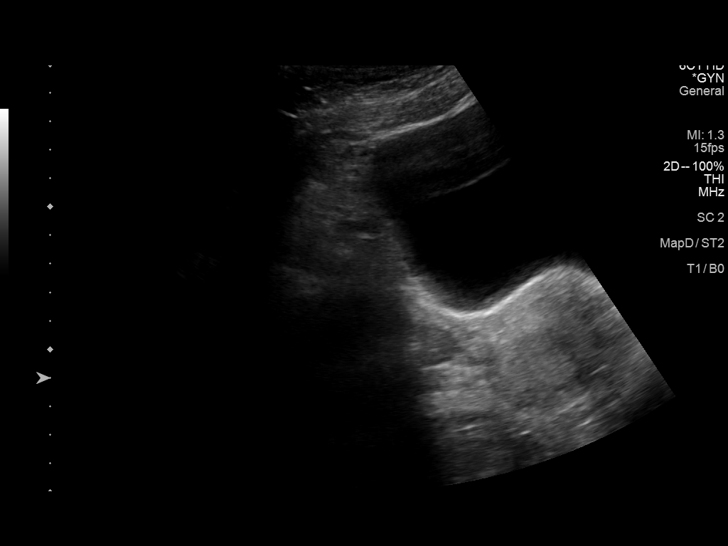
[im 32/76]
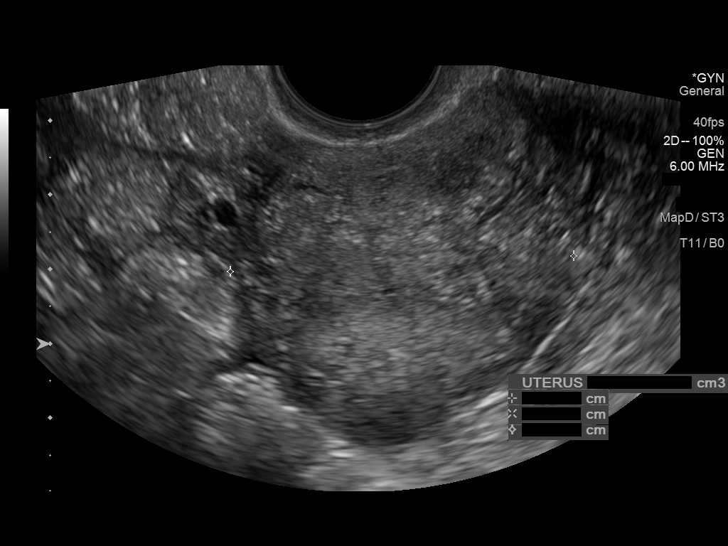
[im 38/76]
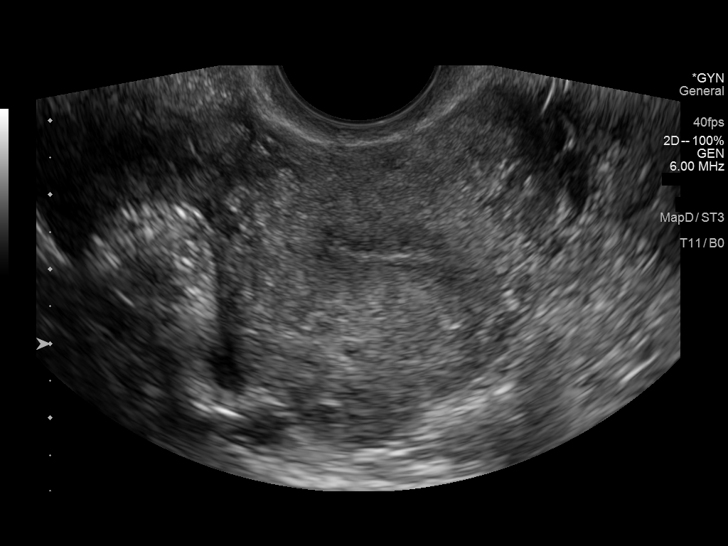
[im 44/76]
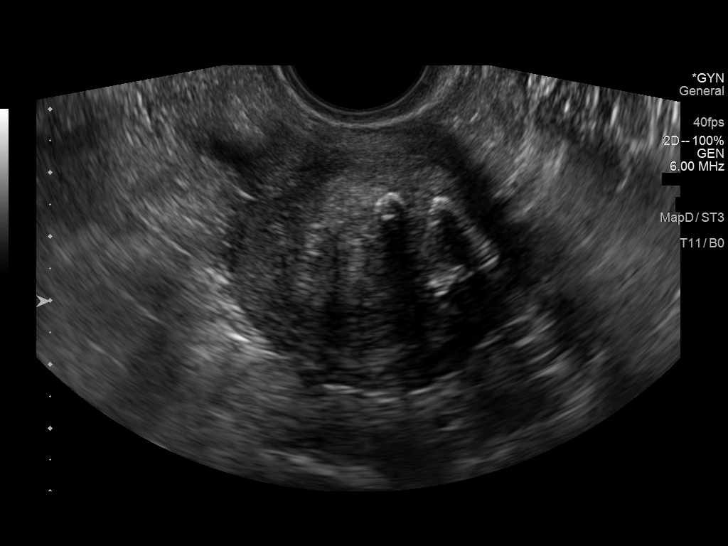
[im 51/76]
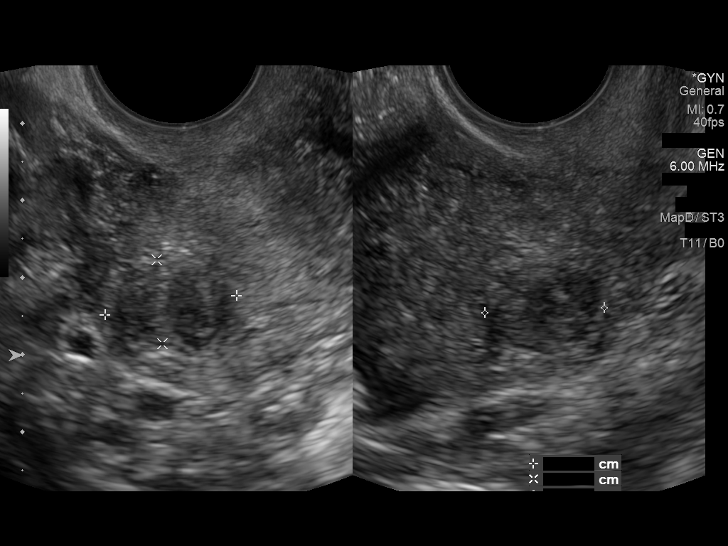
[im 57/76]
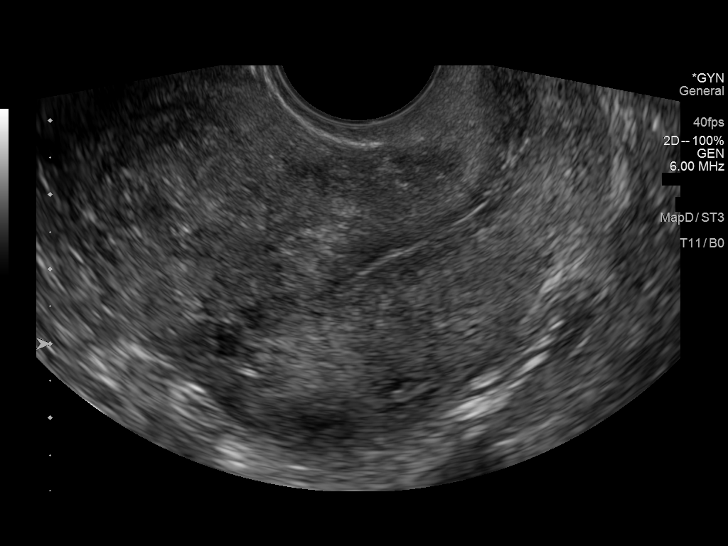
[im 63/76]
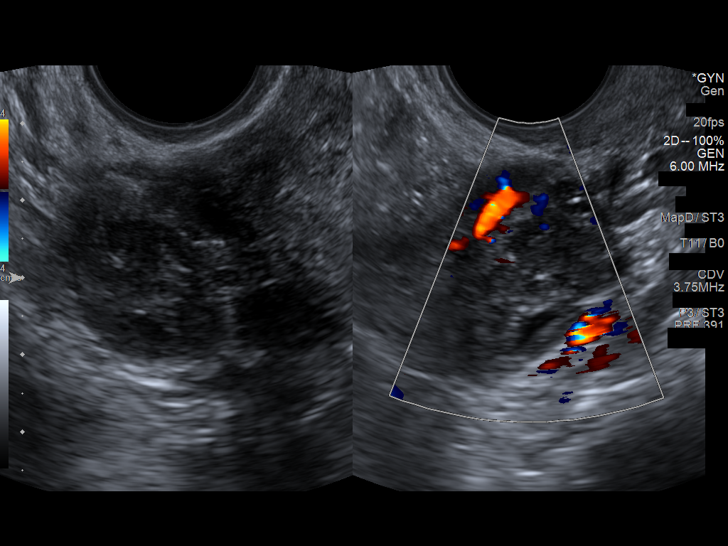
[im 69/76]
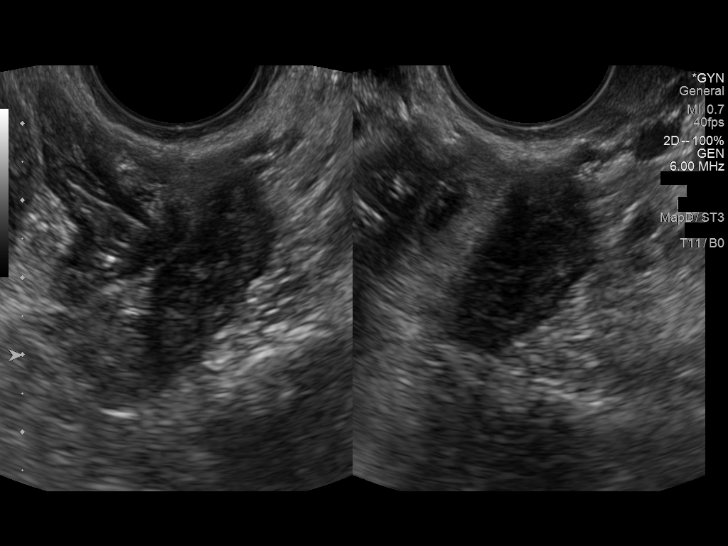
[im 76/76]
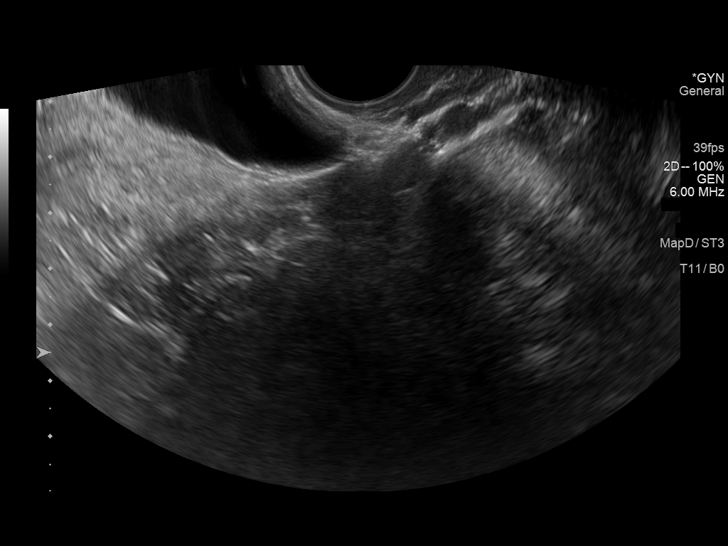

[13 of 25 positions shown; findings below may reference images not displayed]

FINDINGS: Uterus

Measurements: 8.3 x 5.5 x 5.8 cm = volume: 139 mL. Inhomogeneous
myometrium. LEFT fundal leiomyoma 4.9 x 3.7 x 5.8 cm, subserosal.
Additional smaller intramural leiomyoma RIGHT mid uterus posteriorly
1.7 x 1.1 x 1.6 cm.

Endometrium

Thickness: 2 mm.  No endometrial fluid or focal abnormality

Right ovary

Measurements: 2.0 x 2.4 x 2.0 cm = volume: 4.8 mL. Normal morphology
without mass

Left ovary

Measurements: 3.0 x 1.1 x 1.4 cm = volume: 2.5 mL. Normal morphology
without mass

Other findings

No free pelvic fluid.  No adnexal masses.
IMPRESSION: Two uterine leiomyomata including a 5.8 cm subserosal LEFT fundal
leiomyoma and an intramural RIGHT mid posterior uterine leiomyoma
1.7 cm diameter.

Unremarkable endometrial complex and ovaries.
